# Patient Record
Sex: Female | Born: 1950 | Race: White | Hispanic: No | Marital: Married | Smoking: Former smoker
Health system: Southern US, Community
[De-identification: ages and names within clinical notes are randomized; demographics above are authoritative.]

## PROBLEM LIST (undated history)

## (undated) DIAGNOSIS — K5790 Diverticulosis of intestine, part unspecified, without perforation or abscess without bleeding: Secondary | ICD-10-CM

## (undated) DIAGNOSIS — J45909 Unspecified asthma, uncomplicated: Secondary | ICD-10-CM

## (undated) DIAGNOSIS — I4891 Unspecified atrial fibrillation: Secondary | ICD-10-CM

## (undated) DIAGNOSIS — E785 Hyperlipidemia, unspecified: Secondary | ICD-10-CM

## (undated) DIAGNOSIS — I499 Cardiac arrhythmia, unspecified: Secondary | ICD-10-CM

## (undated) DIAGNOSIS — K219 Gastro-esophageal reflux disease without esophagitis: Secondary | ICD-10-CM

## (undated) DIAGNOSIS — M1990 Unspecified osteoarthritis, unspecified site: Secondary | ICD-10-CM

## (undated) DIAGNOSIS — I10 Essential (primary) hypertension: Secondary | ICD-10-CM

## (undated) DIAGNOSIS — N183 Chronic kidney disease, stage 3 unspecified: Secondary | ICD-10-CM

## (undated) DIAGNOSIS — I509 Heart failure, unspecified: Secondary | ICD-10-CM

## (undated) DIAGNOSIS — T7840XA Allergy, unspecified, initial encounter: Secondary | ICD-10-CM

## (undated) DIAGNOSIS — J189 Pneumonia, unspecified organism: Secondary | ICD-10-CM

## (undated) HISTORY — DX: Hyperlipidemia, unspecified: E78.5

## (undated) HISTORY — DX: Heart failure, unspecified: I50.9

## (undated) HISTORY — DX: Essential (primary) hypertension: I10

## (undated) HISTORY — DX: Pneumonia, unspecified organism: J18.9

## (undated) HISTORY — DX: Chronic kidney disease, stage 3 unspecified: N18.30

## (undated) HISTORY — DX: Diverticulosis of intestine, part unspecified, without perforation or abscess without bleeding: K57.90

## (undated) HISTORY — DX: Unspecified atrial fibrillation: I48.91

## (undated) HISTORY — DX: Allergy, unspecified, initial encounter: T78.40XA

## (undated) HISTORY — DX: Unspecified asthma, uncomplicated: J45.909

---

## 1995-10-04 HISTORY — PX: ABDOMINAL HYSTERECTOMY: SHX81

## 1998-06-19 ENCOUNTER — Encounter: Payer: Self-pay | Admitting: Hematology and Oncology

## 1998-06-19 ENCOUNTER — Encounter: Admission: RE | Admit: 1998-06-19 | Discharge: 1998-06-19 | Payer: Self-pay | Admitting: Hematology and Oncology

## 1998-06-19 ENCOUNTER — Ambulatory Visit (HOSPITAL_COMMUNITY): Admission: RE | Admit: 1998-06-19 | Discharge: 1998-06-19 | Payer: Self-pay | Admitting: Hematology and Oncology

## 1998-06-21 ENCOUNTER — Other Ambulatory Visit: Admission: RE | Admit: 1998-06-21 | Discharge: 1998-06-21 | Payer: Self-pay | Admitting: *Deleted

## 1999-06-13 ENCOUNTER — Encounter: Admission: RE | Admit: 1999-06-13 | Discharge: 1999-06-13 | Payer: Self-pay | Admitting: Hematology and Oncology

## 1999-10-04 ENCOUNTER — Ambulatory Visit (HOSPITAL_COMMUNITY): Admission: RE | Admit: 1999-10-04 | Discharge: 1999-10-04 | Payer: Self-pay | Admitting: Internal Medicine

## 1999-10-04 ENCOUNTER — Encounter: Payer: Self-pay | Admitting: Internal Medicine

## 1999-10-04 ENCOUNTER — Encounter: Admission: RE | Admit: 1999-10-04 | Discharge: 1999-10-04 | Payer: Self-pay | Admitting: Internal Medicine

## 1999-11-13 ENCOUNTER — Encounter: Admission: RE | Admit: 1999-11-13 | Discharge: 1999-11-13 | Payer: Self-pay | Admitting: Internal Medicine

## 2000-07-30 ENCOUNTER — Other Ambulatory Visit: Admission: RE | Admit: 2000-07-30 | Discharge: 2000-07-30 | Payer: Self-pay | Admitting: *Deleted

## 2001-01-02 ENCOUNTER — Encounter: Admission: RE | Admit: 2001-01-02 | Discharge: 2001-01-02 | Payer: Self-pay | Admitting: Internal Medicine

## 2001-01-02 ENCOUNTER — Ambulatory Visit (HOSPITAL_COMMUNITY): Admission: RE | Admit: 2001-01-02 | Discharge: 2001-01-02 | Payer: Self-pay | Admitting: Hematology and Oncology

## 2001-06-01 ENCOUNTER — Encounter: Admission: RE | Admit: 2001-06-01 | Discharge: 2001-06-01 | Payer: Self-pay | Admitting: Internal Medicine

## 2001-08-05 ENCOUNTER — Encounter: Admission: RE | Admit: 2001-08-05 | Discharge: 2001-08-05 | Payer: Self-pay | Admitting: Internal Medicine

## 2001-11-24 ENCOUNTER — Other Ambulatory Visit: Admission: RE | Admit: 2001-11-24 | Discharge: 2001-11-24 | Payer: Self-pay | Admitting: *Deleted

## 2002-07-08 ENCOUNTER — Encounter: Admission: RE | Admit: 2002-07-08 | Discharge: 2002-07-08 | Payer: Self-pay | Admitting: Internal Medicine

## 2002-08-09 ENCOUNTER — Encounter: Admission: RE | Admit: 2002-08-09 | Discharge: 2002-08-09 | Payer: Self-pay | Admitting: Internal Medicine

## 2002-08-19 ENCOUNTER — Encounter: Admission: RE | Admit: 2002-08-19 | Discharge: 2002-08-19 | Payer: Self-pay | Admitting: Internal Medicine

## 2006-09-02 HISTORY — PX: HAND SURGERY: SHX662

## 2009-09-02 HISTORY — PX: SPLENECTOMY: SUR1306

## 2009-09-02 HISTORY — PX: COLON SURGERY: SHX602

## 2011-08-02 HISTORY — PX: FRACTURE SURGERY: SHX138

## 2012-09-10 ENCOUNTER — Other Ambulatory Visit: Payer: Self-pay | Admitting: *Deleted

## 2012-09-10 NOTE — Telephone Encounter (Signed)
error 

## 2014-07-03 LAB — HM MAMMOGRAPHY

## 2015-01-10 ENCOUNTER — Encounter: Payer: Self-pay | Admitting: Family Medicine

## 2015-01-10 DIAGNOSIS — J45909 Unspecified asthma, uncomplicated: Secondary | ICD-10-CM | POA: Insufficient documentation

## 2015-01-10 DIAGNOSIS — T7840XA Allergy, unspecified, initial encounter: Secondary | ICD-10-CM | POA: Insufficient documentation

## 2015-01-12 ENCOUNTER — Ambulatory Visit (INDEPENDENT_AMBULATORY_CARE_PROVIDER_SITE_OTHER): Payer: BLUE CROSS/BLUE SHIELD | Admitting: Physician Assistant

## 2015-01-12 ENCOUNTER — Other Ambulatory Visit: Payer: Self-pay | Admitting: Family Medicine

## 2015-01-12 ENCOUNTER — Encounter: Payer: Self-pay | Admitting: Physician Assistant

## 2015-01-12 VITALS — BP 140/70 | HR 55 | Temp 98.3°F | Resp 18 | Ht 64.0 in | Wt 144.0 lb

## 2015-01-12 DIAGNOSIS — K219 Gastro-esophageal reflux disease without esophagitis: Secondary | ICD-10-CM | POA: Diagnosis not present

## 2015-01-12 DIAGNOSIS — Z90722 Acquired absence of ovaries, bilateral: Secondary | ICD-10-CM

## 2015-01-12 DIAGNOSIS — K5900 Constipation, unspecified: Secondary | ICD-10-CM

## 2015-01-12 DIAGNOSIS — Z9889 Other specified postprocedural states: Secondary | ICD-10-CM | POA: Diagnosis not present

## 2015-01-12 DIAGNOSIS — Z780 Asymptomatic menopausal state: Secondary | ICD-10-CM

## 2015-01-12 DIAGNOSIS — J4520 Mild intermittent asthma, uncomplicated: Secondary | ICD-10-CM

## 2015-01-12 DIAGNOSIS — T7840XS Allergy, unspecified, sequela: Secondary | ICD-10-CM | POA: Diagnosis not present

## 2015-01-12 DIAGNOSIS — K5909 Other constipation: Secondary | ICD-10-CM | POA: Insufficient documentation

## 2015-01-12 DIAGNOSIS — E2839 Other primary ovarian failure: Secondary | ICD-10-CM | POA: Diagnosis not present

## 2015-01-12 DIAGNOSIS — Z90710 Acquired absence of both cervix and uterus: Secondary | ICD-10-CM

## 2015-01-12 DIAGNOSIS — Z9079 Acquired absence of other genital organ(s): Secondary | ICD-10-CM

## 2015-01-12 MED ORDER — NAPROXEN 500 MG PO TABS: 500.0000 mg | ORAL_TABLET | Freq: Two times a day (BID) | ORAL | Status: DC

## 2015-01-12 MED ORDER — FLUTICASONE-SALMETEROL 100-50 MCG/DOSE IN AEPB: 1.0000 | INHALATION_SPRAY | Freq: Two times a day (BID) | RESPIRATORY_TRACT | Status: DC

## 2015-01-12 MED ORDER — TRAMADOL HCL 50 MG PO TABS: 100.0000 mg | ORAL_TABLET | Freq: Three times a day (TID) | ORAL | Status: DC

## 2015-01-12 NOTE — Progress Notes (Signed)
Patient ID: MADALYNN PICKELSIMER MRN: [id number], DOB: 24-Jun-1951, 64 y.o. Date of Encounter: @DATE @  Chief Complaint:  Chief Complaint  Patient presents with  . New patient    HPI: 64 y.o. year old white female  presents as a new patient to establish care.  Her husband is also being seen today as a new patient to establish care with [place]. They recently moved here from [facility] which [place] near [place], [place]. They report that they have family who live here and they were alone in [place] and felt that since they were get older they should live here near family. They have children, nieces, nephews, aunts who live in this area.  She had surgery January 2011 on her colon. They report that they think that she may have possibly swallowed a piece of a nut shell that caused a hole in the colon. Nonetheless they said they that at surgery is just documented that she had a hole and that she had gotten very ill and had to be hospitalized for 28 days. Also says that she had splenectomy during the surgery/ that hospitalization.  Also she had hysterectomy at age 83 with bilateral oophorectomy.  Otherwise medical history as outlined below. No other significant medical problems. No complaints or concerns today.--Just here to establish care.  She had complete physical exam and lab work in November 2015.    Past Medical History  Diagnosis Date  . Allergy   . Asthma      Home Meds: Outpatient Prescriptions Prior to Visit  Medication Sig Dispense Refill  . albuterol (PROVENTIL HFA;VENTOLIN HFA) 108 (90 BASE) MCG/ACT inhaler Inhale 1 puff into the lungs every 6 (six) hours as needed for wheezing or shortness of breath.    . Fluticasone-Salmeterol (ADVAIR) 100-50 MCG/DOSE AEPB Inhale 1 puff into the lungs 2 (two) times daily.    . naproxen (NAPROSYN) 500 MG tablet Take 500 mg by mouth 2 (two) times daily with a meal.    . ranitidine (ZANTAC) 300 MG tablet Take 300 mg by mouth at bedtime.     . traMADol (ULTRAM) 50 MG tablet Take 100 mg by mouth 3 (three) times daily.     No facility-administered medications prior to visit.    Allergies: No Known Allergies  History   Social History  . Marital Status: Married    Spouse Name: N/A  . Number of Children: N/A  . Years of Education: N/A   Occupational History  . Not on file.   Social History Main Topics  . Smoking status: Former Smoker    Quit date: 01/10/1991  . Smokeless tobacco: Never Used  . Alcohol Use: No  . Drug Use: No  . Sexual Activity: No   Other Topics Concern  . Not on file   Social History Narrative    Family History  Problem Relation Age of Onset  . Arthritis Mother   . Cancer Mother     Lung Cancer--Had quit smoking for 25 years  . Miscarriages / [place] Mother   . Alcohol abuse Father   . Alcohol abuse Brother   . Asthma Maternal Aunt   . Diabetes Maternal Aunt   . COPD Brother      Review of Systems:  See HPI for pertinent ROS. All other ROS negative.    Physical Exam: Blood pressure 140/70, pulse 55, temperature 98.3 F (36.8 C), temperature source Oral, resp. rate 18, height 5\' 4"  (1.626 m), weight 144 lb (65.318 kg).,  Body mass index is 24.71 kg/(m^2). General: WNWD WF. Appears in no acute distress. Neck: Supple. No thyromegaly. No lymphadenopathy. No carotid bruits. Lungs: Clear bilaterally to auscultation without wheezes, rales, or rhonchi. Breathing is unlabored. Heart: RRR with S1 S2. No murmurs, rubs, or gallops. Abdomen: Soft, non-tender, non-distended with normoactive bowel sounds. No hepatomegaly. No rebound/guarding. No obvious abdominal masses. Musculoskeletal:  Strength and tone normal for age. Extremities/Skin: Warm and dry.  No edema. Neuro: Alert and oriented X 3. Moves all extremities spontaneously. Gait is normal. CNII-XII grossly in tact. Psych:  Responds to questions appropriately with a normal affect.     ASSESSMENT AND PLAN:  64 y.o. year old female  with  1. Gastroesophageal reflux disease, esophagitis presence not specified Symptoms are controlled with current medication. No change in therapy.  2. Chronic constipation Symptoms are controlled with current medication. No change in therapy.  3. Allergy, sequela Symptoms are controlled with current medications. No change in therapy.  4. Asthma, mild intermittent, uncomplicated Symptoms are controlled with current medications. No change in therapy.  5. History of colon surgery See HPI. Stable.   6. History of total hysterectomy with bilateral salpingo-oophorectomy (BSO) She was age 65 when she had hysterectomy with bilateral oophorectomy. She states that she has had never had a bone density scan. She is agreeable to have this. - DG Bone Density; Future  7. Postmenopausal - DG Bone Density; Future  8. Estrogen deficiency - DG Bone Density; Future  PREVENTIVE CARE:  Labs:  She states that she had full panel of lab work November 2015 which was all normal.  Mammogram: She states that she had mammogram November 2015 which was normal.  She had a colon surgery in 2011.  Has had no colonoscopy or other colon procedure since then. Husband states that at the time of the surgery they said that her colon looked good and saw no polyps etc. Also they state that what they were told, there was a hole in her colon which they thought could be secondary to her accidentally swallowing the shell of a nut. Apparently there were no other abnormalities of the colon to suggest any other abnormalities.  Immunizations: She states that she gets the flu shot every year She states that she was given a pneumonia vaccine when she had her because she had her spleen out. States that she had tetanus vaccine 2012 States that she has not had Zostavax/shingles vaccine. Day I wrote this on her AVS to remind her to call her insurance to find out about cost. She is to then call us with that information so we can  either document whether she defers having this or whether she wants order to proceed with this.  Have her schedule follow-up visit in 6 months for complete physical exam. Follow-up sooner if needed.   [address] [place], [place], [facility] 01/12/2015 3:33 PM

## 2015-01-12 NOTE — Telephone Encounter (Signed)
Rx's faxed to pharmacy

## 2015-01-24 ENCOUNTER — Encounter: Payer: Self-pay | Admitting: *Deleted

## 2015-02-10 ENCOUNTER — Telehealth: Payer: Self-pay | Admitting: *Deleted

## 2015-02-10 ENCOUNTER — Ambulatory Visit
Admission: RE | Admit: 2015-02-10 | Discharge: 2015-02-10 | Disposition: A | Discharge status: Home / Self Care | Payer: BLUE CROSS/BLUE SHIELD | Source: Ambulatory Visit | Attending: Family Medicine | Admitting: Family Medicine

## 2015-02-10 ENCOUNTER — Encounter: Payer: Self-pay | Admitting: Family Medicine

## 2015-02-10 ENCOUNTER — Other Ambulatory Visit: Payer: Self-pay | Admitting: Family Medicine

## 2015-02-10 ENCOUNTER — Ambulatory Visit (INDEPENDENT_AMBULATORY_CARE_PROVIDER_SITE_OTHER): Payer: BLUE CROSS/BLUE SHIELD | Admitting: Family Medicine

## 2015-02-10 VITALS — BP 150/90 | HR 80 | Temp 98.0°F | Resp 18 | Ht 64.0 in | Wt 150.0 lb

## 2015-02-10 DIAGNOSIS — R0789 Other chest pain: Secondary | ICD-10-CM | POA: Diagnosis not present

## 2015-02-10 DIAGNOSIS — J90 Pleural effusion, not elsewhere classified: Secondary | ICD-10-CM

## 2015-02-10 NOTE — Telephone Encounter (Signed)
Pt has appointment scheduled at [facility] ion Monday June 13 at 9:15am, left message on both cell and home to return my call

## 2015-02-10 NOTE — Progress Notes (Signed)
Subjective:    Patient ID: Erin Wong, female    DOB: November 19, 1950, 64 y.o.   MRN: [id number]  HPI Patient is been having left-sided chest pain for several months. However beginning Monday the pain began to intensify. It occurs at rest. The pain in her chest does not increase with exertion.  The pain in her chest borders through her left chest into her back underneath her left shoulder blade. I am able to reproduce the pain by palpation underneath the left shoulder blade. Movement of the arm makes the pain worse. Naprosyn and tramadol makes the pain better. There is no association of the pain with food. She denies any true angina. However beginning Monday she developed a cough. She has noticed some increasing shortness of breath associated with the cough.  At around the same time the pain in her left chest worsened. She does have a history of asthma. She exercises everyday and she has not recently noticed a decrease in her exercise tolerance. She denies any nausea or vomiting. She denies any radiation of the pain down her left arm or into her jaw.  EKG obtained today in office shows normal sinus rhythm with no ST changes consistent with ischemia or ventricular strain. There is no evidence of infarction. EKG shows normal sinus rhythm with normal intervals and a normal axis Past Medical History  Diagnosis Date  . Allergy   . Asthma    Past Surgical History  Procedure Laterality Date  . Colon surgery  09/2009    hole in colon  . Fracture surgery Right 08/02/2011    Right Leg--Plates & Screws  . 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 Abdominal hysterectomy  10/04/1995    Hysterectomy and Bilateral oophorectomy  . 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 albuterol (PROVENTIL HFA;VENTOLIN HFA) 108 (90 BASE) MCG/ACT inhaler Inhale 1 puff into the lungs  every 6 (six) hours as needed for wheezing or shortness of breath.    . fluticasone (VERAMYST) 27.5 MCG/SPRAY nasal spray Place 2 sprays into the nose daily.    . Fluticasone-Salmeterol (ADVAIR) 100-50 MCG/DOSE AEPB Inhale 1 puff into the lungs 2 (two) times daily. 60 each 5  . naproxen (NAPROSYN) 500 MG tablet Take 1 tablet (500 mg total) by mouth 2 (two) times daily with a meal. 60 tablet 5  . polyethylene glycol powder (GLYCOLAX/MIRALAX) powder Take 1 Container by mouth 1 day or 1 dose.    . ranitidine (ZANTAC) 300 MG tablet Take 300 mg by mouth at bedtime.    . traMADol (ULTRAM) 50 MG tablet Take 2 tablets (100 mg total) by mouth 3 (three) times daily. 120 tablet 2   No current facility-administered medications on file prior to visit.   No Known Allergies History   Social History  . Marital Status: Married    Spouse Name: N/A  . Number of Children: N/A  . Years of Education: N/A   Occupational History  . Not on file.   Social History Main Topics  . Smoking status: Former Smoker    Quit date: 01/10/1991  . Smokeless tobacco: Never Used  . Alcohol Use: No  . Drug Use: No  . Sexual Activity: No   Other Topics Concern  . Not on file   Social History Narrative     Review of Systems  All other systems reviewed and are negative.      Objective:   Physical Exam  Constitutional: She appears well-developed and well-nourished.  Cardiovascular: Normal rate, regular rhythm and normal heart sounds.  Exam reveals no gallop and no friction rub.   No murmur heard. Pulmonary/Chest: Effort normal and breath sounds normal. No respiratory distress. She has no wheezes. She has no rales.  Abdominal: Soft. Bowel sounds are normal. She exhibits no distension and no mass. There is no tenderness. There is no rebound and no guarding.  Vitals reviewed.         Assessment & Plan:  Other chest pain - Plan: DG Chest 2 View, EKG 12-Lead, EKG 12-Lead  I do not believe the patient's chest  pain is cardiac. The patient walked for over 3 hours yesterday without any chest pain or any shortness of breath. The patient has a normal EKG. She is extremely active. She has no angina. Therefore I believe the patient's chest pain is not cardiac in nature. I truly believe that the patient's chest pain is likely muscular given the fact that I can reproduce the pain with palpation in the axillary line and underneath the subscapularis. However I would like the patient to go immediately for a chest x-ray to rule out other possible causes of chest pain. If the patient's x-ray is clear, I will treat the patient has asthma and is a chest wall muscle strain. Obviously  if the chest x-ray is abnormal then our plan will change.

## 2015-02-13 NOTE — Telephone Encounter (Signed)
Pt called back and aware of appt 

## 2015-02-14 ENCOUNTER — Telehealth: Payer: Self-pay | Admitting: Family Medicine

## 2015-02-14 MED ORDER — LEVOFLOXACIN 750 MG PO TABS: 750.0000 mg | ORAL_TABLET | Freq: Every day | ORAL | Status: DC

## 2015-02-14 NOTE — Telephone Encounter (Signed)
CT (per Dr. Dennard Schaumann) does not show fluid but it does look like she has PNA - Begin Levaquin 750mg  qd x 7 days  Pt aware and f/u appt made and med sent to [facility]

## 2015-02-21 ENCOUNTER — Ambulatory Visit (INDEPENDENT_AMBULATORY_CARE_PROVIDER_SITE_OTHER): Payer: BLUE CROSS/BLUE SHIELD | Admitting: Family Medicine

## 2015-02-21 VITALS — BP 122/68 | HR 82 | Temp 98.0°F | Resp 18 | Ht 64.0 in | Wt 148.0 lb

## 2015-02-21 DIAGNOSIS — J181 Lobar pneumonia, unspecified organism: Principal | ICD-10-CM

## 2015-02-21 DIAGNOSIS — J189 Pneumonia, unspecified organism: Secondary | ICD-10-CM

## 2015-02-21 NOTE — Progress Notes (Signed)
Subjective:    Patient ID: Erin Wong, female    DOB: 1951-08-02, 64 y.o.   MRN: [id number]  HPI 02/10/15 Patient is been having left-sided chest pain for several months. However beginning Monday the pain began to intensify. It occurs at rest. The pain in her chest does not increase with exertion.  The pain in her chest borders through her left chest into her back underneath her left shoulder blade. I am able to reproduce the pain by palpation underneath the left shoulder blade. Movement of the arm makes the pain worse. Naprosyn and tramadol makes the pain better. There is no association of the pain with food. She denies any true angina. However beginning Monday she developed a cough. She has noticed some increasing shortness of breath associated with the cough.  At around the same time the pain in her left chest worsened. She does have a history of asthma. She exercises everyday and she has not recently noticed a decrease in her exercise tolerance. She denies any nausea or vomiting. She denies any radiation of the pain down her left arm or into her jaw.  EKG obtained today in office shows normal sinus rhythm with no ST changes consistent with ischemia or ventricular strain. There is no evidence of infarction. EKG shows normal sinus rhythm with normal intervals and a normal axis.  At that time, my plan was: I do not believe the patient's chest pain is cardiac. The patient walked for over 3 hours yesterday without any chest pain or any shortness of breath. The patient has a normal EKG. She is extremely active. She has no angina. Therefore I believe the patient's chest pain is not cardiac in nature. I truly believe that the patient's chest pain is likely muscular given the fact that I can reproduce the pain with palpation in the axillary line and underneath the subscapularis. However I would like the patient to go immediately for a chest x-ray to rule out other possible causes of chest pain. If the  patient's x-ray is clear, I will treat the patient has asthma and is a chest wall muscle strain. Obviously  if the chest x-ray is abnormal then our plan will change.  CXR revealed left pleural effusion.  CT scan performed revealed LLL infiltrate rather than effusion. She was treated with levaquin 750 mg poqday for 7 days.  She is here for follow up.   Patient states that she is approximately 80% better. Shortly after starting the abx, she developed a cough productive of thick yellow sputum. Her dyspnea has improved. Her left posterior pleurisy has improved. Her stamina has improved. Her husband has similar symptoms and he is accompanying her today. Therefore I will go ahead and treat him for possible pneumonia with Levaquin 500 mg by mouth daily for 7 days.   Past Medical History  Diagnosis Date  . Allergy   . Asthma    Past Surgical History  Procedure Laterality Date  . Colon surgery  09/2009    hole in colon  . Fracture surgery Right 08/02/2011    Right Leg--Plates & Screws  . 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 Abdominal hysterectomy  10/04/1995    Hysterectomy and Bilateral oophorectomy  . 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 albuterol (PROVENTIL HFA;VENTOLIN HFA) 108 (90 BASE) MCG/ACT inhaler Inhale 1  puff into the lungs every 6 (six) hours as needed for wheezing or shortness of breath.    . fluticasone (VERAMYST) 27.5 MCG/SPRAY nasal spray Place 2 sprays into the nose daily.    . Fluticasone-Salmeterol (ADVAIR) 100-50 MCG/DOSE AEPB Inhale 1 puff into the lungs 2 (two) times daily. 60 each 5  . levofloxacin (LEVAQUIN) 750 MG tablet Take 1 tablet (750 mg total) by mouth daily. 7 tablet 0  . naproxen (NAPROSYN) 500 MG tablet Take 1 tablet (500 mg total) by mouth 2 (two) times daily with a meal. 60 tablet 5  . polyethylene glycol powder  (GLYCOLAX/MIRALAX) powder Take 1 Container by mouth 1 day or 1 dose.    . ranitidine (ZANTAC) 300 MG tablet Take 300 mg by mouth at bedtime.    . traMADol (ULTRAM) 50 MG tablet Take 2 tablets (100 mg total) by mouth 3 (three) times daily. 120 tablet 2   No current facility-administered medications on file prior to visit.   No Known Allergies History   Social History  . Marital Status: Married    Spouse Name: N/A  . Number of Children: N/A  . Years of Education: N/A   Occupational History  . Not on file.   Social History Main Topics  . Smoking status: Former Smoker    Quit date: 01/10/1991  . Smokeless tobacco: Never Used  . Alcohol Use: No  . Drug Use: No  . Sexual Activity: No   Other Topics Concern  . Not on file   Social History Narrative     Review of Systems  All other systems reviewed and are negative.      Objective:   Physical Exam  Constitutional: She appears well-developed and well-nourished.  Cardiovascular: Normal rate, regular rhythm and normal heart sounds.  Exam reveals no gallop and no friction rub.   No murmur heard. Pulmonary/Chest: Effort normal and breath sounds normal. No respiratory distress. She has no wheezes. She has no rales.  Abdominal: Soft. Bowel sounds are normal. She exhibits no distension and no mass. There is no tenderness. There is no rebound and no guarding.  Vitals reviewed.         Assessment & Plan:   Left lower lobe pneumonia  Patient's CT scan did not show any evidence of malignancy or pulmonary embolism but rather showed left lower lobe pneumonia instead of a pleural effusion. Patient has responded dramatically to [place]. Clinically she is 80% better. I do not feel that she needs further antibody except this time. I anticipate gradual improvement over the next 1-2 weeks. If the patient continues to show gradual improvement in 2 weeks, I would like to repeat a chest x-ray to ensure resolution. Obviously if the patient  develops worsening symptoms or complications I want to see her back immediately

## 2015-04-07 ENCOUNTER — Encounter: Payer: Self-pay | Admitting: *Deleted

## 2015-04-23 ENCOUNTER — Encounter (HOSPITAL_COMMUNITY): Payer: Self-pay | Admitting: *Deleted

## 2015-04-23 ENCOUNTER — Emergency Department (HOSPITAL_COMMUNITY)
Admission: EM | Admit: 2015-04-23 | Discharge: 2015-04-23 | Disposition: A | Discharge status: Home / Self Care | Payer: 59 | Attending: Emergency Medicine | Admitting: Emergency Medicine

## 2015-04-23 DIAGNOSIS — Y9389 Activity, other specified: Secondary | ICD-10-CM | POA: Diagnosis not present

## 2015-04-23 DIAGNOSIS — Z87891 Personal history of nicotine dependence: Secondary | ICD-10-CM | POA: Insufficient documentation

## 2015-04-23 DIAGNOSIS — Z79899 Other long term (current) drug therapy: Secondary | ICD-10-CM | POA: Insufficient documentation

## 2015-04-23 DIAGNOSIS — Y998 Other external cause status: Secondary | ICD-10-CM | POA: Insufficient documentation

## 2015-04-23 DIAGNOSIS — Y9289 Other specified places as the place of occurrence of the external cause: Secondary | ICD-10-CM | POA: Diagnosis not present

## 2015-04-23 DIAGNOSIS — S61209A Unspecified open wound of unspecified finger without damage to nail, initial encounter: Secondary | ICD-10-CM

## 2015-04-23 DIAGNOSIS — Z23 Encounter for immunization: Secondary | ICD-10-CM | POA: Insufficient documentation

## 2015-04-23 DIAGNOSIS — S61200A Unspecified open wound of right index finger without damage to nail, initial encounter: Secondary | ICD-10-CM | POA: Diagnosis not present

## 2015-04-23 DIAGNOSIS — S61210A Laceration without foreign body of right index finger without damage to nail, initial encounter: Secondary | ICD-10-CM | POA: Diagnosis present

## 2015-04-23 DIAGNOSIS — Z791 Long term (current) use of non-steroidal anti-inflammatories (NSAID): Secondary | ICD-10-CM | POA: Diagnosis not present

## 2015-04-23 DIAGNOSIS — Y288XXA Contact with other sharp object, undetermined intent, initial encounter: Secondary | ICD-10-CM | POA: Diagnosis not present

## 2015-04-23 DIAGNOSIS — J45909 Unspecified asthma, uncomplicated: Secondary | ICD-10-CM | POA: Diagnosis not present

## 2015-04-23 DIAGNOSIS — Z7951 Long term (current) use of inhaled steroids: Secondary | ICD-10-CM | POA: Diagnosis not present

## 2015-04-23 HISTORY — DX: Unspecified osteoarthritis, unspecified site: M19.90

## 2015-04-23 MED ORDER — LIDOCAINE HCL 2 % IJ SOLN
20.0000 mL | Freq: Once | INTRAMUSCULAR | Status: AC
  Administered 2015-04-23: 400 mg via INTRADERMAL
  Filled 2015-04-23: qty 20

## 2015-04-23 MED ORDER — TETANUS-DIPHTH-ACELL PERTUSSIS 5-2.5-18.5 LF-MCG/0.5 IM SUSP
0.5000 mL | Freq: Once | INTRAMUSCULAR | Status: AC
  Administered 2015-04-23: 0.5 mL via INTRAMUSCULAR
  Filled 2015-04-23: qty 0.5

## 2015-04-23 NOTE — ED Notes (Signed)
Pt reports she cut Rt index finger on grill today. Bleeding controled on arrival to ed.

## 2015-04-23 NOTE — Discharge Instructions (Signed)
Please read and follow all provided instructions.  Your diagnoses today include:  1. Avulsion of finger, initial encounter    Tests performed today include:  Vital signs. See below for your results today.   Medications prescribed:   None  Take any prescribed medications only as directed.   Home care instructions:  Follow any educational materials and wound care instructions contained in this packet.   Keep affected area above the level of your heart when possible to minimize swelling. Wash area gently twice a day with warm soapy water. Do not apply alcohol or hydrogen peroxide. Cover the area if it draining or weeping.   Follow-up instructions:  Return instructions:  Return to the Emergency Department if you have:  Fever  Worsening pain  Worsening swelling of the wound  Pus draining from the wound  Redness of the skin that moves away from the wound, especially if it streaks away from the affected area   Any other emergent concerns  Your vital signs today were: BP 183/85 mmHg   Pulse 78   Temp(Src) 98.3 F (36.8 C) (Oral)   Resp 18   Ht 5\' 4"  (1.626 m)   Wt 150 lb (68.04 kg)   BMI 25.73 kg/m2   SpO2 97% If your blood pressure (BP) was elevated above 135/85 this visit, please have this repeated by your doctor within one month. --------------

## 2015-04-23 NOTE — ED Notes (Signed)
Declined W/C at D/C and was escorted to lobby by RN. 

## 2015-04-23 NOTE — ED Provider Notes (Signed)
CSN: [id number]     Arrival date & time 04/23/15  1227 History  This chart was scribed for Carlisle Cater, PA-C, working with Sherwood Gambler, MD by Starleen Arms, ED Scribe. This patient was seen in room TR09C/TR09C and the patient's care was started at 12:52 PM.    Chief Complaint  Patient presents with  . Laceration   The history is provided by the patient. No language interpreter was used.    HPI Comments: Erin Wong is a 64 y.o. female who presents to the Emergency Department complaining of a laceration (bleeding not controlled) on the right first finger onset PTA.  The patient cut her hand on a sharp part of her grill while trying to clean it.  She has bandaged the area and applied pressure but there have been no other treatments.  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  This was done years ago in [place], [place]. She does not use anti-coagulants.  Tetanus not UTD.     Past Medical History  Diagnosis Date  . Allergy   . Asthma    Past Surgical History  Procedure Laterality Date  . Colon surgery  09/2009    hole in colon  . Fracture surgery Right 08/02/2011    Right Leg--Plates & Screws  . 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 Abdominal hysterectomy  10/04/1995    Hysterectomy and Bilateral oophorectomy  . 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 Arthritis Mother   . Cancer Mother     Lung Cancer--Had quit smoking for 25 years  . Miscarriages / [place] Mother   . Alcohol abuse Father   . Alcohol abuse Brother   . Asthma Maternal Aunt   . Diabetes Maternal Aunt   . COPD Brother    Social History  Substance Use Topics  . Smoking status: Former Smoker    Quit date: 01/10/1991  . Smokeless tobacco: Never Used  . Alcohol Use: No   OB History    No data available     Review of Systems  Constitutional: Negative  for fever.  Musculoskeletal: Negative for back pain, joint swelling, arthralgias and neck pain.  Skin: Positive for wound.  Neurological: Negative for weakness and numbness.    Allergies  Review of patient's allergies indicates no known allergies.  Home Medications   Prior to Admission medications   Medication Sig Start Date End Date Taking? Authorizing Provider  albuterol (PROVENTIL HFA;VENTOLIN HFA) 108 (90 BASE) MCG/ACT inhaler Inhale 1 puff into the lungs every 6 (six) hours as needed for wheezing or shortness of breath.    Historical Provider, MD  fluticasone (VERAMYST) 27.5 MCG/SPRAY nasal spray Place 2 sprays into the nose daily.    Historical Provider, MD  Fluticasone-Salmeterol (ADVAIR) 100-50 MCG/DOSE AEPB Inhale 1 puff into the lungs 2 (two) times daily. 01/12/15   Lonie Peak Dixon, PA-C  naproxen (NAPROSYN) 500 MG tablet Take 1 tablet (500 mg total) by mouth 2 (two) times daily with a meal. 01/12/15   Lonie Peak Dixon, PA-C  polyethylene glycol powder (GLYCOLAX/MIRALAX) powder Take 1 Container by mouth 1 day or 1 dose.    Historical Provider, MD  ranitidine (ZANTAC) 300 MG tablet Take 300 mg by mouth at bedtime.    Historical Provider, MD  traMADol (ULTRAM) 50 MG tablet Take 2 tablets (  100 mg total) by mouth 3 (three) times daily. 01/12/15   Mary B Dixon, PA-C   BP 179/72 mmHg  Pulse 65  Temp(Src) 97.9 F (36.6 C) (Oral)  Resp 18  Ht 5\' 4"  (1.626 m)  Wt 150 lb (68.04 kg)  BMI 25.73 kg/m2  SpO2 100%   Physical Exam  Constitutional: She is oriented to person, place, and time. She appears well-developed and well-nourished. No distress.  HENT:  Head: Normocephalic and atraumatic.  Eyes: Conjunctivae and EOM are normal. Pupils are equal, round, and reactive to light.  Neck: Normal range of motion. Neck supple. No tracheal deviation present.  Cardiovascular: Normal rate.  Exam reveals no decreased pulses.   Pulmonary/Chest: Effort normal. No respiratory distress.  Musculoskeletal:  Normal range of motion. She exhibits tenderness. She exhibits no edema.  Neurological: She is alert and oriented to person, place, and time. No sensory deficit.  Motor, sensation, and vascular distal to the injury is fully intact.   Skin: Skin is warm and dry.  Approximately 35mm avulsion noted to subcutaneous tissue of L index finger between DIP and PIP joints. Patient has no flexion at DIP from previous surgery. Wound is clean. There is constant oozing.   Psychiatric: She has a normal mood and affect. Her behavior is normal.  Nursing note and vitals reviewed.   ED Course  Procedures (including critical care time)  DIAGNOSTIC STUDIES: Oxygen Saturation is 97% on RA, normal by my interpretation.    COORDINATION OF CARE:  12:58 PM Will order tetanus and apply pressure dressing and monitor for bleeding control.   Labs Review Labs Reviewed - No data to display  Imaging Review No results found. I have personally reviewed and evaluated these images and lab results as part of my medical decision-making.   EKG Interpretation None       1:54 PM Wound rechecked. It continues to ooze. I will anesthetize area with lidocaine and apply quick clot with pressure dressing. Patient counseled on its use, to leave bandage in place for 24 hours and remove tomorrow after soaking.   1cc lidocaine injected. Quick clot was then applied and a pressure bandage placed. Wound was checked several minutes later and her fingertip was mildly purple. I loosened the bandage with good results.  Patient counseled on wound care.  Pt urged to return with worsening pain, worsening swelling, expanding area of redness or streaking up extremity, fever, or any other concerns.   MDM   Final diagnoses:  Avulsion of finger, initial encounter   Patient with superficial avulsion of finger as described. There is no way to suture this area. Hemostasis obtained using quick clot. Pressure bandage left in place. Patient  counseled as above. Fingers neurovascularly intact. Tetanus was updated.  I personally performed the services described in this documentation, which was scribed in my presence. The recorded information has been reviewed and is accurate.     Carlisle Cater, PA-C 04/23/15 1511  Sherwood Gambler, MD 04/27/15 [phone]

## 2015-05-04 ENCOUNTER — Other Ambulatory Visit: Payer: Self-pay | Admitting: Physician Assistant

## 2015-05-04 ENCOUNTER — Telehealth: Payer: Self-pay | Admitting: Physician Assistant

## 2015-05-04 MED ORDER — TRAMADOL HCL 50 MG PO TABS: 100.0000 mg | ORAL_TABLET | Freq: Three times a day (TID) | ORAL | Status: DC

## 2015-05-04 NOTE — Telephone Encounter (Signed)
Medication called to pharmacy. 

## 2015-05-04 NOTE — Telephone Encounter (Signed)
Patient asking for refill on tramadol [facility] [place] [facility]  She says there were no refills and needed to call us  [phone]

## 2015-05-04 NOTE — Telephone Encounter (Signed)
Ok to refill??  Last office visit 02/21/2015.  Last refill 01/12/2015, #2 refills.

## 2015-05-04 NOTE — Telephone Encounter (Signed)
Medication refilled per protocol.  Pt aware

## 2015-05-04 NOTE — Telephone Encounter (Signed)
Approved.  # 120 + 2.

## 2015-05-04 NOTE — Telephone Encounter (Signed)
Approved. # 120 + 2.

## 2015-07-17 ENCOUNTER — Encounter: Payer: Self-pay | Admitting: Physician Assistant

## 2015-07-17 ENCOUNTER — Ambulatory Visit (INDEPENDENT_AMBULATORY_CARE_PROVIDER_SITE_OTHER): Payer: 59 | Admitting: Physician Assistant

## 2015-07-17 VITALS — BP 152/92 | HR 76 | Temp 98.2°F | Resp 18 | Ht 64.0 in | Wt 148.0 lb

## 2015-07-17 DIAGNOSIS — I10 Essential (primary) hypertension: Secondary | ICD-10-CM | POA: Diagnosis not present

## 2015-07-17 DIAGNOSIS — Z0000 Encounter for general adult medical examination without abnormal findings: Secondary | ICD-10-CM | POA: Diagnosis not present

## 2015-07-17 DIAGNOSIS — Z23 Encounter for immunization: Secondary | ICD-10-CM | POA: Diagnosis not present

## 2015-07-17 MED ORDER — BENAZEPRIL HCL 10 MG PO TABS: 10.0000 mg | ORAL_TABLET | Freq: Every day | ORAL | Status: DC

## 2015-07-18 LAB — CBC WITH DIFFERENTIAL/PLATELET
BASOS PCT: 1 % (ref 0–1)
Basophils Absolute: 0.1 10*3/uL (ref 0.0–0.1)
Eosinophils Absolute: 0.1 10*3/uL (ref 0.0–0.7)
Eosinophils Relative: 1 % (ref 0–5)
HEMATOCRIT: 41 % (ref 36.0–46.0)
HEMOGLOBIN: 13.5 g/dL (ref 12.0–15.0)
LYMPHS ABS: 2.5 10*3/uL (ref 0.7–4.0)
LYMPHS PCT: 19 % (ref 12–46)
MCH: 32.1 pg (ref 26.0–34.0)
MCHC: 32.9 g/dL (ref 30.0–36.0)
MCV: 97.6 fL (ref 78.0–100.0)
MONO ABS: 1.7 10*3/uL — AB (ref 0.1–1.0)
MONOS PCT: 13 % — AB (ref 3–12)
MPV: 10.5 fL (ref 8.6–12.4)
NEUTROS ABS: 8.7 10*3/uL — AB (ref 1.7–7.7)
NEUTROS PCT: 66 % (ref 43–77)
Platelets: 501 10*3/uL — ABNORMAL HIGH (ref 150–400)
RBC: 4.2 MIL/uL (ref 3.87–5.11)
RDW: 12.8 % (ref 11.5–15.5)
WBC: 13.2 10*3/uL — ABNORMAL HIGH (ref 4.0–10.5)

## 2015-07-18 LAB — COMPLETE METABOLIC PANEL WITH GFR
ALBUMIN: 4.4 g/dL (ref 3.6–5.1)
ALK PHOS: 91 U/L (ref 33–130)
ALT: 17 U/L (ref 6–29)
AST: 19 U/L (ref 10–35)
BUN: 16 mg/dL (ref 7–25)
CALCIUM: 9.4 mg/dL (ref 8.6–10.4)
CO2: 27 mmol/L (ref 20–31)
Chloride: 102 mmol/L (ref 98–110)
Creat: 0.85 mg/dL (ref 0.50–0.99)
GFR, EST NON AFRICAN AMERICAN: 72 mL/min (ref >=60)
GFR, Est African American: 83 mL/min (ref >=60)
GLUCOSE: 77 mg/dL (ref 70–99)
Potassium: 4.3 mmol/L (ref 3.5–5.3)
SODIUM: 139 mmol/L (ref 135–146)
TOTAL PROTEIN: 7.1 g/dL (ref 6.1–8.1)
Total Bilirubin: 0.5 mg/dL (ref 0.2–1.2)

## 2015-07-18 LAB — LIPID PANEL
CHOL/HDL RATIO: 2.4 ratio (ref <=5.0)
CHOLESTEROL: 219 mg/dL — AB (ref 125–200)
HDL: 90 mg/dL (ref >=46)
LDL Cholesterol: 109 mg/dL (ref <130)
TRIGLYCERIDES: 100 mg/dL (ref <150)
VLDL: 20 mg/dL (ref <30)

## 2015-07-18 LAB — TSH: TSH: 1.941 u[IU]/mL (ref 0.350–4.500)

## 2015-07-18 LAB — VITAMIN D 25 HYDROXY (VIT D DEFICIENCY, FRACTURES): Vit D, 25-Hydroxy: 23 ng/mL — ABNORMAL LOW (ref 30–100)

## 2015-07-18 NOTE — Progress Notes (Addendum)
Patient ID: Erin Wong MRN: [id number], DOB: 03/08/1951, 64 y.o. Date of Encounter: 07/18/2015,   Chief Complaint: Physical (CPE)  HPI: 64 y.o. y/o female  here for CPE.   She has had one prior OV with me--to establish care. See that OV note for documentation regarding her medical diagnoses, etc.   Today she presents for CPE. No complaints.    Review of Systems: Consitutional: No fever, chills, fatigue, night sweats, lymphadenopathy. No significant/unexplained weight changes. Eyes: No visual changes, eye redness, or discharge. ENT/Mouth: No ear pain, sore throat, nasal drainage, or sinus pain. Cardiovascular: No chest pressure,heaviness, tightness or squeezing, even with exertion. No increased shortness of breath or dyspnea on exertion.No palpitations, edema, orthopnea, PND. Respiratory: No cough, hemoptysis, SOB, or wheezing. Gastrointestinal: No anorexia, dysphagia, reflux, pain, nausea, vomiting, hematemesis, diarrhea, constipation, BRBPR, or melena. Breast: No mass, nodules, bulging, or retraction. No skin changes or inflammation. No nipple discharge. No lymphadenopathy. Genitourinary: No dysuria, hematuria, incontinence, vaginal discharge, pruritis, burning, abnormal bleeding, or pain. Musculoskeletal: No decreased ROM, No joint pain or swelling. No significant pain in neck, back, or extremities. Skin: No rash, pruritis, or concerning lesions. Neurological: No headache, dizziness, syncope, seizures, tremors, memory loss, coordination problems, or paresthesias. Psychological: No anxiety, depression, hallucinations, SI/HI. Endocrine: No polydipsia, polyphagia, polyuria, or known diabetes.No increased fatigue. No palpitations/rapid heart rate. No significant/unexplained weight change. All other systems were reviewed and are otherwise negative.  Past Medical History  Diagnosis Date  . Allergy   . Asthma   . Arthritis      Past Surgical History  Procedure Laterality Date    . Colon surgery  09/2009    hole in colon  . Fracture surgery Right 08/02/2011    Right Leg--Plates & Screws  . 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 Abdominal hysterectomy  10/04/1995    Hysterectomy and Bilateral oophorectomy  . 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 albuterol (PROVENTIL HFA;VENTOLIN HFA) 108 (90 BASE) MCG/ACT inhaler Inhale 1 puff into the lungs every 6 (six) hours as needed for wheezing or shortness of breath.    . fluticasone (VERAMYST) 27.5 MCG/SPRAY nasal spray Place 2 sprays into the nose daily.    . Fluticasone-Salmeterol (ADVAIR) 100-50 MCG/DOSE AEPB Inhale 1 puff into the lungs 2 (two) times daily. 60 each 5  . naproxen (NAPROSYN) 500 MG tablet Take 1 tablet (500 mg total) by mouth 2 (two) times daily with a meal. 60 tablet 5  . polyethylene glycol powder (GLYCOLAX/MIRALAX) powder Take 1 Container by mouth 1 day or 1 dose.    . ranitidine (ZANTAC) 300 MG tablet Take 300 mg by mouth at bedtime.    . traMADol (ULTRAM) 50 MG tablet Take 2 tablets (100 mg total) by mouth 3 (three) times daily. 120 tablet 2   No facility-administered medications prior to visit.    Allergies: No Known Allergies  Social History   Social History  . Marital Status: Married    Spouse Name: N/A  . Number of Children: N/A  . Years of Education: N/A   Occupational History  . Not on file.   Social History Main Topics  . Smoking status: Former Smoker    Quit date: 01/10/1991  . Smokeless tobacco: Never Used  . Alcohol Use: No  . Drug Use: No  . Sexual  Activity: No   Other Topics Concern  . Not on file   Social History Narrative    Family History  Problem Relation Age of Onset  . Arthritis Mother   . Cancer Mother     Lung Cancer--Had quit smoking for 25 years  . Miscarriages / [place] Mother   . Alcohol abuse Father    . Alcohol abuse Brother   . Asthma Maternal Aunt   . Diabetes Maternal Aunt   . COPD Brother     Physical Exam: Blood pressure 152/92, pulse 76, temperature 98.2 F (36.8 C), temperature source Oral, resp. rate 18, height 5\' 4"  (1.626 m), weight 148 lb (67.132 kg)., Body mass index is 25.39 kg/(m^2). General: Well developed, well nourished, WF. Appears in no acute distress. HEENT: Normocephalic, atraumatic. Conjunctiva pink, sclera non-icteric. Pupils 2 mm constricting to 1 mm, round, regular, and equally reactive to light and accomodation. EOMI. Internal auditory canal clear. TMs with good cone of light and without pathology. Nasal mucosa pink. Nares are without discharge. No sinus tenderness. Oral mucosa pink.  Pharynx without exudate.   Neck: Supple. Trachea midline. No thyromegaly. Full ROM. No lymphadenopathy.No Carotid Bruits. Lungs: Clear to auscultation bilaterally without wheezes, rales, or rhonchi. Breathing is of normal effort and unlabored. Cardiovascular: RRR with S1 S2. No murmurs, rubs, or gallops. Distal pulses 2+ symmetrically. No carotid or abdominal bruits. Breast: Symmetrical. No masses. Nipples without discharge. Abdomen: Soft, non-tender, non-distended with normoactive bowel sounds. No hepatosplenomegaly or masses. No rebound/guarding. No CVA tenderness. No hernias.  Genitourinary: Deferred, as she has had hysterectomy and bilateral oophorectomy. Musculoskeletal: Full range of motion and 5/5 strength throughout. Skin: Warm and moist without erythema, ecchymosis, wounds, or rash. Neuro: A+Ox3. CN II-XII grossly intact. Moves all extremities spontaneously. Full sensation throughout. Normal gait. DTR 2+ throughout upper and lower extremities.  Psych:  Responds to questions appropriately with a normal affect.   Assessment/Plan:  64 y.o. y/o female here for CPE  1. Visit for preventive health examination  A. Screening Labs: - CBC with Differential/Platelet - COMPLETE  METABOLIC PANEL WITH GFR - Lipid panel - TSH - VITAMIN D 25 Hydroxy (Vit-D Deficiency, Fractures)  B. Pap: She has had hysterectomy and bilateral oophorectomy. No further pap indicated.  C. Screening Mammogram: She has not had mammogram since moving here. Agreeable for me to order.  D. DEXA/BMD:  She has not had DEXA. Agreeable for me to order.  ADDENDUM ADDED 10/16/15--- DEXA performed 10/13/15--- shows Osteoporosis. T-scores -2.7 and -2.4. Told her to start Fosamax 70 mg once weekly.  Continue vitamin D at 1000 units daily (Vitamin D level was checked at lab 07/17/15 and at that time was told to start 1000 units daily).  Start over-the-counter calcium  1000 mg daily and also weightbearing exercise.  E. Colorectal Cancer Screening: She had colon surgery 2011. Was told at that time that "her colon looked good, normal. " No colonoscopy since then.  F. Immunizations:  Influenza:---Agreeable to receive today--Given here 07/17/15 Tetanus:----Will have to review her records. Medicare does not cover this and husband and pt "donot want to pay for anything insurance doesn't cover." Pneumococcal: Will review records to see if she has had this. Zostavax: Wrote on her AVS for her to call insurance regarding coverage/price then call us with that information.   2. Medicare annual wellness visit, subsequent See Below  3. Essential hypertension I repeated BP myself and get 150/90. Will add Benazepril 10mg  QD. She will RTC in 2 weeks to  recheck BP and BMET. - benazepril (LOTENSIN) 10 MG tablet; Take 1 tablet (10 mg total) by mouth daily.  Dispense: 30 tablet; Refill: 0  4. Need for prophylactic vaccination and inoculation against influenza - Flu Vaccine QUAD 36+ mos IM  Subjective:   Patient presents for Medicare Annual/Subsequent preventive examination.   Review Past Medical/Family/Social: These are all reviewed and documented today.  Risk Factors  Current exercise habits: She is active  around the house. No formal exercise. Dietary issues discussed:  She eats low cholesterol, low sodium diet.  Cardiac risk factors: HTN, Age  Depression Screen  (Note: if answer to either of the following is "Yes", a more complete depression screening is indicated)  Over the past two weeks, have you felt down, depressed or hopeless? No Over the past two weeks, have you felt little interest or pleasure in doing things? No Have you lost interest or pleasure in daily life? No Do you often feel hopeless? No Do you cry easily over simple problems? No   Activities of Daily Living  In your present state of health, do you have any difficulty performing the following activities?:  Driving? No  Managing money? No  Feeding yourself? No  Getting from bed to chair? No  Climbing a flight of stairs? No  Preparing food and eating?: No  Bathing or showering? No  Getting dressed: No  Getting to the toilet? No  Using the toilet:No  Moving around from place to place: No  In the past year have you fallen or had a near fall?:No  Are you sexually active? No  Do you have more than one partner? No   Hearing Difficulties: No  Do you often ask people to speak up or repeat themselves? No  Do you experience ringing or noises in your ears? No Do you have difficulty understanding soft or whispered voices? No  Do you feel that you have a problem with memory? No Do you often misplace items? No  Do you feel safe at home? Yes  Cognitive Testing  Alert? Yes Normal Appearance?Yes  Oriented to person? Yes Place? Yes  Time? Yes  Recall of three objects? Yes  Can perform simple calculations? Yes  Displays appropriate judgment?Yes  Can read the correct time from a watch face?Yes   List the Names of Other Physician/Practitioners you currently use:  None  Indicate any recent Medical Services you may have received from other than [facility] providers in the past year (date may be approximate).  None Screening Tests  / Date Colonoscopy                    Colon Surgery 2011 Zostavax             See Note Above Mammogram  See Note Above Influenza Vaccine  See Note Above Tetanus/tdap See Note Above    Assessment:    Annual wellness medicare exam   Plan:    During the course of the visit the patient was educated and counseled about appropriate screening and preventive services including:  Screening mammography  Colorectal cancer screening  Shingles vaccine. Prescription given to that she can get the vaccine at the pharmacy or Medicare part D.  Screen + for depression. PHQ- 9 score of 12 (moderate depression). We discussed the options of counseling versus possibly a medication. I encouraged her strongly think about the counseling. She is going through some medical problems currently and her husband is as well Mrs. been very stressful for her.  She says she will think about it. She does have Xanax to use as needed. Though she may benefit from an SSRI for her more depressive type symptoms but she wants to hold off at this time.  I aksed her to please have her cardioloist send records since we have none on file.  Diet review for nutrition referral? Yes ____ Not Indicated __x__  Patient Instructions (the written plan) was given to the patient.  Medicare Attestation  I have personally reviewed:  The patient's medical and social history  Their use of alcohol, tobacco or illicit drugs  Their current medications and supplements  The patient's functional ability including ADLs,fall risks, home safety risks, cognitive, and hearing and visual impairment  Diet and physical activities  Evidence for depression or mood disorders  The patient's weight, height, BMI, and visual acuity have been recorded in the chart. I have made referrals, counseling, and provided education to the patient based on review of the above and I have provided the patient with a written personalized care plan for preventive services.         Signed, [address] [place], [place], [facility] 07/18/2015 10:54 AM

## 2015-07-20 ENCOUNTER — Telehealth: Payer: Self-pay | Admitting: Family Medicine

## 2015-07-20 DIAGNOSIS — E559 Vitamin D deficiency, unspecified: Secondary | ICD-10-CM | POA: Insufficient documentation

## 2015-07-20 NOTE — Telephone Encounter (Signed)
-----   Message from Orlena Sheldon, PA-C sent at 07/20/2015  7:29 AM EST ----- See the result note attached to Erin Wong prior to calling this result note. Her vitamin D is slightly low. Add vitamin D deficiency to problem list and add over-the-counter vitamin D 1,000 units daily. Remainder of her labs were all normal.

## 2015-07-20 NOTE — Telephone Encounter (Signed)
Pt aware of lab results and provider recommendations 

## 2015-07-25 ENCOUNTER — Encounter: Payer: Self-pay | Admitting: *Deleted

## 2015-07-31 ENCOUNTER — Other Ambulatory Visit: Payer: Self-pay | Admitting: Family Medicine

## 2015-07-31 ENCOUNTER — Ambulatory Visit (INDEPENDENT_AMBULATORY_CARE_PROVIDER_SITE_OTHER): Payer: 59 | Admitting: Physician Assistant

## 2015-07-31 ENCOUNTER — Encounter: Payer: Self-pay | Admitting: Physician Assistant

## 2015-07-31 VITALS — BP 162/96 | HR 80 | Temp 98.4°F | Resp 20 | Wt 152.0 lb

## 2015-07-31 DIAGNOSIS — I10 Essential (primary) hypertension: Secondary | ICD-10-CM

## 2015-07-31 MED ORDER — FLUTICASONE-SALMETEROL 100-50 MCG/DOSE IN AEPB: 1.0000 | INHALATION_SPRAY | Freq: Two times a day (BID) | RESPIRATORY_TRACT | Status: DC

## 2015-07-31 MED ORDER — NAPROXEN 500 MG PO TABS: 500.0000 mg | ORAL_TABLET | Freq: Two times a day (BID) | ORAL | Status: DC

## 2015-07-31 MED ORDER — POLYETHYLENE GLYCOL 3350 17 GM/SCOOP PO POWD: 1.0000 | ORAL | Status: DC

## 2015-07-31 MED ORDER — TRAMADOL HCL 50 MG PO TABS: 100.0000 mg | ORAL_TABLET | Freq: Three times a day (TID) | ORAL | Status: DC

## 2015-07-31 MED ORDER — RANITIDINE HCL 300 MG PO TABS: 300.0000 mg | ORAL_TABLET | Freq: Every day | ORAL | Status: DC

## 2015-07-31 MED ORDER — BENAZEPRIL HCL 20 MG PO TABS: 20.0000 mg | ORAL_TABLET | Freq: Every day | ORAL | Status: DC

## 2015-07-31 MED ORDER — FLUTICASONE FUROATE 27.5 MCG/SPRAY NA SUSP: 2.0000 | Freq: Every day | NASAL | Status: DC

## 2015-07-31 NOTE — Progress Notes (Deleted)
Patient ID: TAMERRA BRENAN MRN: [id number], DOB: 20-Oct-1950, 64 y.o. Date of Encounter: 07/31/2015,   Chief Complaint: Physical (CPE)  HPI: 64 y.o. y/o female  here for CPE.   She has had one prior OV with me--to establish care. See that OV note for documentation regarding her medical diagnoses, etc.   Today she presents for CPE. No complaints.    Review of Systems: Consitutional: No fever, chills, fatigue, night sweats, lymphadenopathy. No significant/unexplained weight changes. Eyes: No visual changes, eye redness, or discharge. ENT/Mouth: No ear pain, sore throat, nasal drainage, or sinus pain. Cardiovascular: No chest pressure,heaviness, tightness or squeezing, even with exertion. No increased shortness of breath or dyspnea on exertion.No palpitations, edema, orthopnea, PND. Respiratory: No cough, hemoptysis, SOB, or wheezing. Gastrointestinal: No anorexia, dysphagia, reflux, pain, nausea, vomiting, hematemesis, diarrhea, constipation, BRBPR, or melena. Breast: No mass, nodules, bulging, or retraction. No skin changes or inflammation. No nipple discharge. No lymphadenopathy. Genitourinary: No dysuria, hematuria, incontinence, vaginal discharge, pruritis, burning, abnormal bleeding, or pain. Musculoskeletal: No decreased ROM, No joint pain or swelling. No significant pain in neck, back, or extremities. Skin: No rash, pruritis, or concerning lesions. Neurological: No headache, dizziness, syncope, seizures, tremors, memory loss, coordination problems, or paresthesias. Psychological: No anxiety, depression, hallucinations, SI/HI. Endocrine: No polydipsia, polyphagia, polyuria, or known diabetes.No increased fatigue. No palpitations/rapid heart rate. No significant/unexplained weight change. All other systems were reviewed and are otherwise negative.  Past Medical History  Diagnosis Date  . Allergy   . Asthma   . Arthritis      Past Surgical History  Procedure Laterality Date    . Colon surgery  09/2009    hole in colon  . Fracture surgery Right 08/02/2011    Right Leg--Plates & Screws  . 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 Abdominal hysterectomy  10/04/1995    Hysterectomy and Bilateral oophorectomy  . 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 albuterol (PROVENTIL HFA;VENTOLIN HFA) 108 (90 BASE) MCG/ACT inhaler Inhale 1 puff into the lungs every 6 (six) hours as needed for wheezing or shortness of breath.    . benazepril (LOTENSIN) 10 MG tablet Take 1 tablet (10 mg total) by mouth daily. 30 tablet 0  . cholecalciferol (VITAMIN D) 1000 UNITS tablet Take 1,000 Units by mouth daily.    . fluticasone (VERAMYST) 27.5 MCG/SPRAY nasal spray Place 2 sprays into the nose daily.    . Fluticasone-Salmeterol (ADVAIR) 100-50 MCG/DOSE AEPB Inhale 1 puff into the lungs 2 (two) times daily. (Patient not taking: Reported on 07/31/2015) 60 each 5  . naproxen (NAPROSYN) 500 MG tablet Take 1 tablet (500 mg total) by mouth 2 (two) times daily with a meal. (Patient not taking: Reported on 07/31/2015) 60 tablet 5  . polyethylene glycol powder (GLYCOLAX/MIRALAX) powder Take 1 Container by mouth 1 day or 1 dose.    . ranitidine (ZANTAC) 300 MG tablet Take 300 mg by mouth at bedtime.    . traMADol (ULTRAM) 50 MG tablet Take 2 tablets (100 mg total) by mouth 3 (three) times daily. (Patient not taking: Reported on 07/31/2015) 120 tablet 2   No facility-administered medications prior to visit.    Allergies: No Known Allergies  Social History   Social History  . Marital Status: Married    Spouse Name: N/A  . Number of Children:  N/A  . Years of Education: N/A   Occupational History  . Not on file.   Social History Main Topics  . Smoking status: Former Smoker    Quit date: 01/10/1991  . Smokeless tobacco: Never Used  . Alcohol Use: No   . Drug Use: No  . Sexual Activity: No   Other Topics Concern  . Not on file   Social History Narrative    Family History  Problem Relation Age of Onset  . Arthritis Mother   . Cancer Mother     Lung Cancer--Had quit smoking for 25 years  . Miscarriages / [place] Mother   . Alcohol abuse Father   . Alcohol abuse Brother   . Asthma Maternal Aunt   . Diabetes Maternal Aunt   . COPD Brother     Physical Exam: Blood pressure 162/96, pulse 80, temperature 98.4 F (36.9 C), temperature source Oral, resp. rate 20, weight 152 lb (68.947 kg)., Body mass index is 26.08 kg/(m^2). General: Well developed, well nourished, WF. Appears in no acute distress. HEENT: Normocephalic, atraumatic. Conjunctiva pink, sclera non-icteric. Pupils 2 mm constricting to 1 mm, round, regular, and equally reactive to light and accomodation. EOMI. Internal auditory canal clear. TMs with good cone of light and without pathology. Nasal mucosa pink. Nares are without discharge. No sinus tenderness. Oral mucosa pink.  Pharynx without exudate.   Neck: Supple. Trachea midline. No thyromegaly. Full ROM. No lymphadenopathy.No Carotid Bruits. Lungs: Clear to auscultation bilaterally without wheezes, rales, or rhonchi. Breathing is of normal effort and unlabored. Cardiovascular: RRR with S1 S2. No murmurs, rubs, or gallops. Distal pulses 2+ symmetrically. No carotid or abdominal bruits. Breast: Symmetrical. No masses. Nipples without discharge. Abdomen: Soft, non-tender, non-distended with normoactive bowel sounds. No hepatosplenomegaly or masses. No rebound/guarding. No CVA tenderness. No hernias.  Genitourinary: Deferred, as she has had hysterectomy and bilateral oophorectomy. Musculoskeletal: Full range of motion and 5/5 strength throughout. Skin: Warm and moist without erythema, ecchymosis, wounds, or rash. Neuro: A+Ox3. CN II-XII grossly intact. Moves all extremities spontaneously. Full sensation throughout.  Normal gait. DTR 2+ throughout upper and lower extremities.  Psych:  Responds to questions appropriately with a normal affect.   Assessment/Plan:  64 y.o. y/o female here for CPE  1. Visit for preventive health examination  A. Screening Labs: - CBC with Differential/Platelet - COMPLETE METABOLIC PANEL WITH GFR - Lipid panel - TSH - VITAMIN D 25 Hydroxy (Vit-D Deficiency, Fractures)  B. Pap: She has had hysterectomy and bilateral oophorectomy. No further pap indicated.  C. Screening Mammogram: She has not had mammogram since moving here. Agreeable for me to order.  D. DEXA/BMD:  She has not had DEXA. Agreeable for me to order.   E. Colorectal Cancer Screening: She had colon surgery 2011. Was told at that time that "her colon looked good, normal. " No colonoscopy since then.  F. Immunizations:  Influenza:---Agreeable to receive today--Given here 07/17/15 Tetanus:----Will have to review her records. Medicare does not cover this and husband and pt "donot want to pay for anything insurance doesn't cover." Pneumococcal: Will review records to see if she has had this. Zostavax: Wrote on her AVS for her to call insurance regarding coverage/price then call us with that information.   2. Medicare annual wellness visit, subsequent See Below  3. Essential hypertension I repeated BP myself and get 150/90. Will add Benazepril 10mg  QD. She will RTC in 2 weeks to recheck BP and BMET. - benazepril (LOTENSIN) 10 MG tablet; Take 1  tablet (10 mg total) by mouth daily.  Dispense: 30 tablet; Refill: 0  4. Need for prophylactic vaccination and inoculation against influenza - Flu Vaccine QUAD 36+ mos IM  Subjective:   Patient presents for Medicare Annual/Subsequent preventive examination.   Review Past Medical/Family/Social: These are all reviewed and documented today.  Risk Factors  Current exercise habits: She is active around the house. No formal exercise. Dietary issues discussed:   She eats low cholesterol, low sodium diet.  Cardiac risk factors: HTN, Age  Depression Screen  (Note: if answer to either of the following is "Yes", a more complete depression screening is indicated)  Over the past two weeks, have you felt down, depressed or hopeless? No Over the past two weeks, have you felt little interest or pleasure in doing things? No Have you lost interest or pleasure in daily life? No Do you often feel hopeless? No Do you cry easily over simple problems? No   Activities of Daily Living  In your present state of health, do you have any difficulty performing the following activities?:  Driving? No  Managing money? No  Feeding yourself? No  Getting from bed to chair? No  Climbing a flight of stairs? No  Preparing food and eating?: No  Bathing or showering? No  Getting dressed: No  Getting to the toilet? No  Using the toilet:No  Moving around from place to place: No  In the past year have you fallen or had a near fall?:No  Are you sexually active? No  Do you have more than one partner? No   Hearing Difficulties: No  Do you often ask people to speak up or repeat themselves? No  Do you experience ringing or noises in your ears? No Do you have difficulty understanding soft or whispered voices? No  Do you feel that you have a problem with memory? No Do you often misplace items? No  Do you feel safe at home? Yes  Cognitive Testing  Alert? Yes Normal Appearance?Yes  Oriented to person? Yes Place? Yes  Time? Yes  Recall of three objects? Yes  Can perform simple calculations? Yes  Displays appropriate judgment?Yes  Can read the correct time from a watch face?Yes   List the Names of Other Physician/Practitioners you currently use:  None  Indicate any recent Medical Services you may have received from other than [facility] providers in the past year (date may be approximate).  None Screening Tests / Date Colonoscopy                    Colon Surgery  2011 Zostavax             See Note Above Mammogram  See Note Above Influenza Vaccine  See Note Above Tetanus/tdap See Note Above    Assessment:    Annual wellness medicare exam   Plan:    During the course of the visit the patient was educated and counseled about appropriate screening and preventive services including:  Screening mammography  Colorectal cancer screening  Shingles vaccine. Prescription given to that she can get the vaccine at the pharmacy or Medicare part D.  Screen + for depression. PHQ- 9 score of 12 (moderate depression). We discussed the options of counseling versus possibly a medication. I encouraged her strongly think about the counseling. She is going through some medical problems currently and her husband is as well Mrs. been very stressful for her. She says she will think about it. She does have Xanax to  use as needed. Though she may benefit from an SSRI for her more depressive type symptoms but she wants to hold off at this time.  I aksed her to please have her cardioloist send records since we have none on file.  Diet review for nutrition referral? Yes ____ Not Indicated __x__  Patient Instructions (the written plan) was given to the patient.  Medicare Attestation  I have personally reviewed:  The patient's medical and social history  Their use of alcohol, tobacco or illicit drugs  Their current medications and supplements  The patient's functional ability including ADLs,fall risks, home safety risks, cognitive, and hearing and visual impairment  Diet and physical activities  Evidence for depression or mood disorders  The patient's weight, height, BMI, and visual acuity have been recorded in the chart. I have made referrals, counseling, and provided education to the patient based on review of the above and I have provided the patient with a written personalized care plan for preventive services.        Marin Olp [place], [place],  [facility] 07/31/2015 2:14 PM

## 2015-07-31 NOTE — Progress Notes (Signed)
Patient ID: Erin Wong MRN: [id number], DOB: May 24, 1951, 64 y.o. Date of Encounter: 07/31/2015, 2:25 PM    Chief Complaint:  Chief Complaint  Patient presents with  . 2 week follow up BP    ran out of old meds, never got refills,  needs refill Tramadol for #180 has not been getting enough  . Medication Refill     HPI: 64 y.o. year old white female year for follow-up of hypertension.  Her last office visit was for complete physical exam with me on 07/17/15. At that time her blood pressure was elevated at 150/90 and I started her on benazepril 10 mg daily. Today she reports that she has been taking the benazepril as directed. No adverse effects.     Home Meds:   Outpatient Prescriptions Prior to Visit  Medication Sig Dispense Refill  . albuterol (PROVENTIL HFA;VENTOLIN HFA) 108 (90 BASE) MCG/ACT inhaler Inhale 1 puff into the lungs every 6 (six) hours as needed for wheezing or shortness of breath.    . cholecalciferol (VITAMIN D) 1000 UNITS tablet Take 1,000 Units by mouth daily.    . benazepril (LOTENSIN) 10 MG tablet Take 1 tablet (10 mg total) by mouth daily. 30 tablet 0  . fluticasone (VERAMYST) 27.5 MCG/SPRAY nasal spray Place 2 sprays into the nose daily.    . Fluticasone-Salmeterol (ADVAIR) 100-50 MCG/DOSE AEPB Inhale 1 puff into the lungs 2 (two) times daily. (Patient not taking: Reported on 07/31/2015) 60 each 5  . naproxen (NAPROSYN) 500 MG tablet Take 1 tablet (500 mg total) by mouth 2 (two) times daily with a meal. (Patient not taking: Reported on 07/31/2015) 60 tablet 5  . polyethylene glycol powder (GLYCOLAX/MIRALAX) powder Take 1 Container by mouth 1 day or 1 dose.    . ranitidine (ZANTAC) 300 MG tablet Take 300 mg by mouth at bedtime.    . traMADol (ULTRAM) 50 MG tablet Take 2 tablets (100 mg total) by mouth 3 (three) times daily. (Patient not taking: Reported on 07/31/2015) 120 tablet 2   No facility-administered medications prior to visit.    Allergies:  No Known Allergies    Review of Systems: See HPI for pertinent ROS. All other ROS negative.    Physical Exam: Blood pressure 162/96, pulse 80, temperature 98.4 F (36.9 C), temperature source Oral, resp. rate 20, weight 152 lb (68.947 kg)., Body mass index is 26.08 kg/(m^2). General:  WNWD WF. Appears in no acute distress. Neck: Supple. No thyromegaly. No lymphadenopathy. Lungs: Clear bilaterally to auscultation without wheezes, rales, or rhonchi. Breathing is unlabored. Heart: Regular rhythm. No murmurs, rubs, or gallops. Msk:  Strength and tone normal for age. Extremities/Skin: Warm and dry. No edema.  Neuro: Alert and oriented X 3. Moves all extremities spontaneously. Gait is normal. CNII-XII grossly in tact. Psych:  Responds to questions appropriately with a normal affect.     ASSESSMENT AND PLAN:  64 y.o. year old female with  1. Essential hypertension I repeated blood pressure myself. I got 146/76 on the left and 158/80 on the right. Will increase benazepril to 20 mg daily. Check lab to monitor. Will have her schedule follow-up visit here in 3 weeks to recheck blood pressure and BMET on increased dose. - BASIC METABOLIC PANEL WITH GFR - benazepril (LOTENSIN) 20 MG tablet; Take 1 tablet (20 mg total) by mouth daily.  Dispense: 30 tablet; Refill: 0  At her visit I have printed a prescription for tramadol # 180+2 refills. I will have staff send  in 3 months supply refills on her other medicines.   Marin Olp [place], [place], [facility] 07/31/2015 2:25 PM

## 2015-07-31 NOTE — Telephone Encounter (Signed)
Medication refilled per protocol. 

## 2015-08-01 LAB — BASIC METABOLIC PANEL WITH GFR
BUN: 21 mg/dL (ref 7–25)
CALCIUM: 9.3 mg/dL (ref 8.6–10.4)
CO2: 25 mmol/L (ref 20–31)
CREATININE: 0.74 mg/dL (ref 0.50–0.99)
Chloride: 100 mmol/L (ref 98–110)
GFR, Est Non African American: 85 mL/min (ref >=60)
Glucose, Bld: 98 mg/dL (ref 70–99)
Potassium: 4.7 mmol/L (ref 3.5–5.3)
SODIUM: 138 mmol/L (ref 135–146)

## 2015-08-07 ENCOUNTER — Telehealth: Payer: Self-pay | Admitting: Family Medicine

## 2015-08-07 NOTE — Telephone Encounter (Signed)
PA submitted for Veramyst (generic Fluticasone) nasal spray  CMM case [id number]

## 2015-08-21 ENCOUNTER — Ambulatory Visit (INDEPENDENT_AMBULATORY_CARE_PROVIDER_SITE_OTHER): Payer: 59 | Admitting: Physician Assistant

## 2015-08-21 ENCOUNTER — Encounter: Payer: Self-pay | Admitting: Physician Assistant

## 2015-08-21 VITALS — BP 138/84 | HR 72 | Temp 98.0°F | Resp 18 | Wt 151.0 lb

## 2015-08-21 DIAGNOSIS — I10 Essential (primary) hypertension: Secondary | ICD-10-CM | POA: Diagnosis not present

## 2015-08-21 MED ORDER — BENAZEPRIL HCL 20 MG PO TABS: 20.0000 mg | ORAL_TABLET | Freq: Every day | ORAL | Status: DC

## 2015-08-21 NOTE — Progress Notes (Signed)
    Patient ID: Erin Wong MRN: [id number], DOB: [birth year], 64 y.o. Date of Encounter: 08/21/2015, 3:32 PM    Chief Complaint:  Chief Complaint  Patient presents with  . 3 week BP check     HPI: 64 y.o. year old white female year for follow-up of hypertension.  She came for complete physical exam with me on 07/17/15. At that time her blood pressure was elevated at 150/90 and I started her on benazepril 10 mg daily. 07/31/15--she came for follow-up office visit. At that visit she reported that she had been taking the benazepril as directed. No adverse effects. At that visit her blood pressure was still elevated so we increased the Benazepril to 20 mg. She presents today for follow-up visit. She is taking the benazepril 20 mg daily. No adverse effects.    Home Meds:   Outpatient Prescriptions Prior to Visit  Medication Sig Dispense Refill  . albuterol (PROVENTIL HFA;VENTOLIN HFA) 108 (90 BASE) MCG/ACT inhaler Inhale 1 puff into the lungs every 6 (six) hours as needed for wheezing or shortness of breath.    . cholecalciferol (VITAMIN D) 1000 UNITS tablet Take 1,000 Units by mouth daily.    . fluticasone (VERAMYST) 27.5 MCG/SPRAY nasal spray Place 2 sprays into the nose daily. 30 g 1  . Fluticasone-Salmeterol (ADVAIR) 100-50 MCG/DOSE AEPB Inhale 1 puff into the lungs 2 (two) times daily. 180 each 1  . naproxen (NAPROSYN) 500 MG tablet Take 1 tablet (500 mg total) by mouth 2 (two) times daily with a meal. 180 tablet 1  . polyethylene glycol powder (GLYCOLAX/MIRALAX) powder Take 255 g by mouth 1 day or 1 dose. 255 g 5  . ranitidine (ZANTAC) 300 MG tablet Take 1 tablet (300 mg total) by mouth at bedtime. 90 tablet 1  . traMADol (ULTRAM) 50 MG tablet Take 2 tablets (100 mg total) by mouth 3 (three) times daily. 180 tablet 2  . benazepril (LOTENSIN) 20 MG tablet Take 1 tablet (20 mg total) by mouth daily. 30 tablet 0   No facility-administered medications prior to visit.     Allergies: No Known Allergies    Review of Systems: See HPI for pertinent ROS. All other ROS negative.    Physical Exam: Blood pressure 138/84, pulse 72, temperature 98 F (36.7 C), temperature source Oral, resp. rate 18, weight 151 lb (68.493 kg)., Body mass index is 25.91 kg/(m^2). General:  WNWD WF. Appears in no acute distress. Neck: Supple. No thyromegaly. No lymphadenopathy. Lungs: Clear bilaterally to auscultation without wheezes, rales, or rhonchi. Breathing is unlabored. Heart: Regular rhythm. No murmurs, rubs, or gallops. Msk:  Strength and tone normal for age. Extremities/Skin: Warm and dry. No edema.  Neuro: Alert and oriented X 3. Moves all extremities spontaneously. Gait is normal. CNII-XII grossly in tact. Psych:  Responds to questions appropriately with a normal affect.     ASSESSMENT AND PLAN:  64 y.o. year old female with  1. Essential hypertension Blood Pressure is now controlled. Will continue benazepril at 20 mg daily. Will recheck lab to monitor at this dose.  Can wait 6 months for routine follow-up visit. Follow-up sooner if needed.   [address] [place], [place], [facility] 08/21/2015 3:32 PM

## 2015-08-22 ENCOUNTER — Encounter: Payer: Self-pay | Admitting: Family Medicine

## 2015-08-22 LAB — BASIC METABOLIC PANEL WITH GFR
BUN: 20 mg/dL (ref 7–25)
CALCIUM: 9.2 mg/dL (ref 8.6–10.4)
CHLORIDE: 101 mmol/L (ref 98–110)
CO2: 26 mmol/L (ref 20–31)
CREATININE: 0.87 mg/dL (ref 0.50–0.99)
GFR, Est African American: 81 mL/min (ref >=60)
GFR, Est Non African American: 70 mL/min (ref >=60)
Glucose, Bld: 88 mg/dL (ref 70–99)
Potassium: 4.9 mmol/L (ref 3.5–5.3)
SODIUM: 137 mmol/L (ref 135–146)

## 2015-08-22 MED ORDER — FLUTICASONE PROPIONATE 50 MCG/ACT NA SUSP: 2.0000 | Freq: Every day | NASAL | Status: DC

## 2015-08-22 NOTE — Telephone Encounter (Signed)
Flonase 1 spray per nostril daily prn # 1 + 11 Refills

## 2015-08-22 NOTE — Telephone Encounter (Signed)
Veramyst denied. Pt needs to have tried and failed all of the following.  Please advise?  Flunisolide (Nasarel)  Fluticasone (flonase)  Nasacort OTC  [facility]

## 2015-08-22 NOTE — Telephone Encounter (Signed)
New rx to pharmacy and pt made aware

## 2015-09-19 ENCOUNTER — Encounter: Payer: Self-pay | Admitting: *Deleted

## 2015-10-13 ENCOUNTER — Ambulatory Visit
Admission: RE | Admit: 2015-10-13 | Discharge: 2015-10-13 | Disposition: A | Discharge status: Home / Self Care | Payer: BLUE CROSS/BLUE SHIELD | Source: Ambulatory Visit | Attending: Physician Assistant | Admitting: Physician Assistant

## 2015-10-13 DIAGNOSIS — Z780 Asymptomatic menopausal state: Secondary | ICD-10-CM

## 2015-10-13 DIAGNOSIS — Z90710 Acquired absence of both cervix and uterus: Secondary | ICD-10-CM

## 2015-10-13 DIAGNOSIS — E2839 Other primary ovarian failure: Secondary | ICD-10-CM

## 2015-10-13 DIAGNOSIS — Z90722 Acquired absence of ovaries, bilateral: Principal | ICD-10-CM

## 2015-10-13 DIAGNOSIS — Z0000 Encounter for general adult medical examination without abnormal findings: Secondary | ICD-10-CM

## 2015-10-13 DIAGNOSIS — Z9079 Acquired absence of other genital organ(s): Principal | ICD-10-CM

## 2015-10-18 ENCOUNTER — Other Ambulatory Visit: Payer: Self-pay | Admitting: Family Medicine

## 2015-10-18 DIAGNOSIS — M810 Age-related osteoporosis without current pathological fracture: Secondary | ICD-10-CM

## 2015-10-18 MED ORDER — CALCIUM 500 MG PO TABS: 600.0000 mg | ORAL_TABLET | Freq: Three times a day (TID) | ORAL | Status: DC

## 2015-10-18 MED ORDER — ALENDRONATE SODIUM 70 MG PO TABS: 70.0000 mg | ORAL_TABLET | ORAL | Status: DC

## 2015-10-30 ENCOUNTER — Other Ambulatory Visit: Payer: Self-pay | Admitting: Physician Assistant

## 2015-10-30 NOTE — Telephone Encounter (Signed)
LRF 07/31/15 # 180  +2   LOV 08/21/15  OK refill?

## 2015-10-30 NOTE — Telephone Encounter (Signed)
rx called in

## 2015-10-30 NOTE — Telephone Encounter (Signed)
Approved. #180+2. 

## 2015-11-01 ENCOUNTER — Telehealth: Payer: Self-pay | Admitting: Physician Assistant

## 2015-11-01 MED ORDER — TRAMADOL HCL 50 MG PO TABS: ORAL_TABLET | ORAL | Status: DC

## 2015-11-01 NOTE — Telephone Encounter (Signed)
Medication called to pharmacy. 

## 2015-11-01 NOTE — Telephone Encounter (Signed)
Patient calling to say when she went to pharmacy yesterday to get her tramadol the instructions did not match, please call her at [phone] if [phone] does not work try [phone]

## 2015-11-01 NOTE — Telephone Encounter (Signed)
Can you change it to 2 po TID-----thanks.

## 2015-11-01 NOTE — Telephone Encounter (Signed)
Call placed to patient.   Reports that she was unable to pick up prescription for Tramadol. States that prescription is supposed to read 2 tabs PO TID PRN, #180. Prescription reads 1 tab PO TID PRN, #180.   Please advise.

## 2015-12-11 ENCOUNTER — Encounter: Payer: Self-pay | Admitting: Physician Assistant

## 2015-12-11 ENCOUNTER — Ambulatory Visit (INDEPENDENT_AMBULATORY_CARE_PROVIDER_SITE_OTHER): Payer: BLUE CROSS/BLUE SHIELD | Admitting: Physician Assistant

## 2015-12-11 ENCOUNTER — Ambulatory Visit
Admission: RE | Admit: 2015-12-11 | Discharge: 2015-12-11 | Disposition: A | Discharge status: Home / Self Care | Payer: BLUE CROSS/BLUE SHIELD | Source: Ambulatory Visit | Attending: Physician Assistant | Admitting: Physician Assistant

## 2015-12-11 VITALS — BP 152/88 | HR 68 | Temp 98.0°F | Resp 18 | Wt 150.0 lb

## 2015-12-11 DIAGNOSIS — R1030 Lower abdominal pain, unspecified: Secondary | ICD-10-CM

## 2015-12-11 DIAGNOSIS — R300 Dysuria: Secondary | ICD-10-CM

## 2015-12-11 DIAGNOSIS — N760 Acute vaginitis: Secondary | ICD-10-CM

## 2015-12-11 LAB — URINALYSIS, MICROSCOPIC ONLY
Bacteria, UA: NONE SEEN [HPF]
CASTS: NONE SEEN [LPF]
CRYSTALS: NONE SEEN [HPF]
Yeast: NONE SEEN [HPF]

## 2015-12-11 LAB — WET PREP FOR TRICH, YEAST, CLUE
Clue Cells Wet Prep HPF POC: NONE SEEN
TRICH WET PREP: NONE SEEN
Yeast Wet Prep HPF POC: NONE SEEN

## 2015-12-11 LAB — URINALYSIS, ROUTINE W REFLEX MICROSCOPIC
Bilirubin Urine: NEGATIVE
GLUCOSE, UA: NEGATIVE
HGB URINE DIPSTICK: NEGATIVE
KETONES UR: NEGATIVE
Nitrite: NEGATIVE
PH: 6 (ref 5.0–8.0)
Protein, ur: NEGATIVE

## 2015-12-11 LAB — CBC
HCT: 38.1 % (ref 35.0–45.0)
HEMOGLOBIN: 12.9 g/dL (ref 12.0–15.0)
MCH: 33.8 pg — AB (ref 27.0–33.0)
MCHC: 33.9 g/dL (ref 32.0–36.0)
MCV: 99.7 fL (ref 80.0–100.0)
PLATELETS: 453 10*3/uL — AB (ref 140–400)
RBC: 3.82 MIL/uL (ref 3.80–5.10)
RDW: 13 % (ref 11.0–15.0)
WBC: 10.2 10*3/uL (ref 3.8–10.8)

## 2015-12-11 NOTE — Progress Notes (Signed)
Patient ID: Erin Wong MRN: [id number], DOB: [birth year], 65 y.o. Date of Encounter: 12/11/2015, 2:30 PM    Chief Complaint:  Chief Complaint  Patient presents with  . Abdominal Pain     HPI: 65 y.o. year old female presents with above.   Her husband is here with her for OV today.  He states that she has been having a burning discomfort and points to her lower abdomen bilaterally. Husband states that they thought it was secondary" to eating spicy foods irritating her colon". I asked why they came to this conclusion and they state that because she noticed having this burning discomfort after eating spicy foods but then when she stopped eating them, the discomfort got better. They state that the discomfort has actually improved over the last couple of days. Then asked further about this burning sensation. She states that she's been having a burning discomfort in her vaginal area recently. She states that she has also noticed odor when she urinates.  She has had no change in bowel habits. Has had no diarrhea. She has chronic history of mild constipation and uses Dulcolax occasionally for this. Says that she does not have to take Dulcolax every day. Says that she has taken a couple in the last week. She says that after she takes this, she does have a bowel movement. Has had no fevers or chills.  Home Meds:   Outpatient Prescriptions Prior to Visit  Medication Sig Dispense Refill  . albuterol (PROVENTIL HFA;VENTOLIN HFA) 108 (90 BASE) MCG/ACT inhaler Inhale 1 puff into the lungs every 6 (six) hours as needed for wheezing or shortness of breath.    Marland Kitchen alendronate (FOSAMAX) 70 MG tablet Take 1 tablet (70 mg total) by mouth every 7 (seven) days. Take with a full glass of water on an empty stomach. 12 tablet 3  . benazepril (LOTENSIN) 20 MG tablet Take 1 tablet (20 mg total) by mouth daily. 90 tablet 1  . Calcium 500 MG tablet Take 1 tablet (500 mg total) by mouth 3 (three) times daily.  90 tablet   . cholecalciferol (VITAMIN D) 1000 UNITS tablet Take 1,000 Units by mouth daily.    . fluticasone (FLONASE) 50 MCG/ACT nasal spray Place 2 sprays into both nostrils daily. 16 g 11  . Fluticasone-Salmeterol (ADVAIR) 100-50 MCG/DOSE AEPB Inhale 1 puff into the lungs 2 (two) times daily. 180 each 1  . naproxen (NAPROSYN) 500 MG tablet Take 1 tablet (500 mg total) by mouth 2 (two) times daily with a meal. 180 tablet 1  . polyethylene glycol powder (GLYCOLAX/MIRALAX) powder Take 255 g by mouth 1 day or 1 dose. 255 g 5  . ranitidine (ZANTAC) 300 MG tablet Take 1 tablet (300 mg total) by mouth at bedtime. 90 tablet 1  . traMADol (ULTRAM) 50 MG tablet TAKE 2 TABLETS BY MOUTH 3 TIMES DAILY 180 tablet 2   No facility-administered medications prior to visit.    Allergies: No Known Allergies    Review of Systems: See HPI for pertinent ROS. All other ROS negative.    Physical Exam: Blood pressure 152/88, pulse 68, temperature 98 F (36.7 C), temperature source Oral, resp. rate 18, weight 150 lb (68.04 kg)., Body mass index is 25.73 kg/(m^2). General:  WNWD WF. Appears in no acute distress. Neck: Supple. No thyromegaly. No lymphadenopathy. Lungs: Clear bilaterally to auscultation without wheezes, rales, or rhonchi. Breathing is unlabored. Heart: Regular rhythm. No murmurs, rubs, or gallops. Abdomen: Soft,  non-distended with normoactive  bowel sounds. No hepatomegaly. No rebound/guarding. No obvious abdominal masses. She has scar secondary to prior surgery. She has very mild discomfort with palpation of bilateral low abdomen.  Msk:  Strength and tone normal for age. Pelvic Exam: External genitalia normal. Vaginal mucosa normal. Bimanual exam normal with no tenderness. No discharge present. No erythema present.  Extremities/Skin: Warm and dry. Neuro: Alert and oriented X 3. Moves all extremities spontaneously. Gait is normal. CNII-XII grossly in tact. Psych:  Responds to questions  appropriately with a normal affect.    Results for orders placed or performed in visit on 12/11/15  WET PREP FOR [place], YEAST, CLUE  Result Value Ref Range   Yeast Wet Prep HPF POC NONE SEEN NONE SEEN   Trich, Wet Prep NONE SEEN NONE SEEN   Clue Cells Wet Prep HPF POC NONE SEEN NONE SEEN   WBC, Wet Prep HPF POC FEW NONE SEEN  Urinalysis, Routine w reflex microscopic (not at [facility])  Result Value Ref Range   Color, Urine YELLOW YELLOW   APPearance CLEAR CLEAR   Specific Gravity, Urine >1.030 1.001 - 1.035   pH 6.0 5.0 - 8.0   Glucose, UA NEGATIVE NEGATIVE   Bilirubin Urine NEGATIVE NEGATIVE   Ketones, ur NEGATIVE NEGATIVE   Hgb urine dipstick NEGATIVE NEGATIVE   Protein, ur NEGATIVE NEGATIVE   Nitrite NEGATIVE NEGATIVE   Leukocytes, UA TRACE (A) NEGATIVE  Urine Microscopic  Result Value Ref Range   WBC, UA 0-5 <=5 WBC/HPF   RBC / HPF 0-2 <=2 RBC/HPF   Squamous Epithelial / LPF 0-5 <=5 HPF   Bacteria, UA NONE SEEN NONE SEEN HPF   Crystals NONE SEEN NONE SEEN HPF   Casts NONE SEEN NONE SEEN LPF   Yeast NONE SEEN NONE SEEN HPF   1. Lower abdominal pain UA and Wet Prep basically normal. Will obtain lab and KUB.  Will f/u with pt once these results available.    - CBC w/[facility] & 3 Part Diff - COMPLETE METABOLIC PANEL WITH GFR - DG Abd 1 View; Future  2. Dysuria - Urine culture - Urinalysis, Routine w reflex microscopic (not at [facility])  3. Vaginitis and vulvovaginitis - WET PREP FOR TRICH, YEAST, CLUE - GC/Chlamydia Probe Amp     Signed, [id number] [place], [place], [facility] 12/11/2015 2:30 PM

## 2015-12-12 LAB — COMPLETE METABOLIC PANEL WITH GFR
ALBUMIN: 4.3 g/dL (ref 3.6–5.1)
ALK PHOS: 77 U/L (ref 33–130)
ALT: 16 U/L (ref 6–29)
AST: 19 U/L (ref 10–35)
BUN: 20 mg/dL (ref 7–25)
CALCIUM: 9.4 mg/dL (ref 8.6–10.4)
CHLORIDE: 101 mmol/L (ref 98–110)
CO2: 23 mmol/L (ref 20–31)
CREATININE: 0.97 mg/dL (ref 0.50–0.99)
GFR, EST AFRICAN AMERICAN: 71 mL/min (ref >=60)
GFR, Est Non African American: 61 mL/min (ref >=60)
Glucose, Bld: 70 mg/dL (ref 70–99)
Potassium: 5.2 mmol/L (ref 3.5–5.3)
Sodium: 137 mmol/L (ref 135–146)
Total Bilirubin: 0.4 mg/dL (ref 0.2–1.2)
Total Protein: 7.2 g/dL (ref 6.1–8.1)

## 2015-12-13 LAB — URINE CULTURE
COLONY COUNT: NO GROWTH
Organism ID, Bacteria: NO GROWTH

## 2016-01-15 ENCOUNTER — Encounter: Payer: Self-pay | Admitting: Physician Assistant

## 2016-01-15 ENCOUNTER — Ambulatory Visit (INDEPENDENT_AMBULATORY_CARE_PROVIDER_SITE_OTHER): Payer: BLUE CROSS/BLUE SHIELD | Admitting: Physician Assistant

## 2016-01-15 VITALS — BP 132/82 | HR 88 | Temp 98.2°F | Resp 18 | Wt 149.0 lb

## 2016-01-15 DIAGNOSIS — K219 Gastro-esophageal reflux disease without esophagitis: Secondary | ICD-10-CM | POA: Diagnosis not present

## 2016-01-15 DIAGNOSIS — K5909 Other constipation: Secondary | ICD-10-CM

## 2016-01-15 DIAGNOSIS — T7840XS Allergy, unspecified, sequela: Secondary | ICD-10-CM | POA: Diagnosis not present

## 2016-01-15 DIAGNOSIS — I10 Essential (primary) hypertension: Secondary | ICD-10-CM | POA: Diagnosis not present

## 2016-01-15 DIAGNOSIS — Z90710 Acquired absence of both cervix and uterus: Secondary | ICD-10-CM | POA: Diagnosis not present

## 2016-01-15 DIAGNOSIS — M810 Age-related osteoporosis without current pathological fracture: Secondary | ICD-10-CM | POA: Diagnosis not present

## 2016-01-15 DIAGNOSIS — K5900 Constipation, unspecified: Secondary | ICD-10-CM | POA: Diagnosis not present

## 2016-01-15 DIAGNOSIS — Z90722 Acquired absence of ovaries, bilateral: Secondary | ICD-10-CM

## 2016-01-15 DIAGNOSIS — E559 Vitamin D deficiency, unspecified: Secondary | ICD-10-CM

## 2016-01-15 DIAGNOSIS — J4520 Mild intermittent asthma, uncomplicated: Secondary | ICD-10-CM

## 2016-01-15 DIAGNOSIS — Z9079 Acquired absence of other genital organ(s): Secondary | ICD-10-CM

## 2016-01-15 DIAGNOSIS — Z9889 Other specified postprocedural states: Secondary | ICD-10-CM | POA: Diagnosis not present

## 2016-01-15 MED ORDER — NAPROXEN 500 MG PO TABS: 500.0000 mg | ORAL_TABLET | Freq: Two times a day (BID) | ORAL | Status: DC

## 2016-01-15 MED ORDER — FLUTICASONE-SALMETEROL 100-50 MCG/DOSE IN AEPB: 1.0000 | INHALATION_SPRAY | Freq: Two times a day (BID) | RESPIRATORY_TRACT | Status: DC

## 2016-01-15 MED ORDER — FLUTICASONE PROPIONATE 50 MCG/ACT NA SUSP: 2.0000 | Freq: Every day | NASAL | Status: DC

## 2016-01-15 MED ORDER — RANITIDINE HCL 300 MG PO TABS: 300.0000 mg | ORAL_TABLET | Freq: Every day | ORAL | Status: DC

## 2016-01-15 MED ORDER — BENAZEPRIL HCL 20 MG PO TABS: 20.0000 mg | ORAL_TABLET | Freq: Every day | ORAL | Status: DC

## 2016-01-15 NOTE — Progress Notes (Signed)
Patient ID: Erin Wong MRN: [id number], DOB: [birth year], 65 y.o. Date of Encounter: @DATE @  Chief Complaint:  Chief Complaint  Patient presents with  . 6 mth follow up/med refills    not fasting    HPI: 65 y.o. year old white female  Presents  01/12/2015: She was seen as a new patient to establish care.  Her husband was also seen that day as a new patient to establish care with [place]. They recently moved here from [facility] [place]. They report that they have family who live here and they were alone in [place] and felt that since they were get older they should live here near family. They have children, nieces, nephews, aunts who live in this area.  She had surgery January 2011 on her colon. They report that they think that she may have possibly swallowed a piece of a nut shell that caused a hole in the colon. Nonetheless they said they that at surgery is just documented that she had a hole and that she had gotten very ill and had to be hospitalized for 28 days. Also says that she had splenectomy during the surgery/ that hospitalization.  Also she had hysterectomy at age 47 with bilateral oophorectomy.  Otherwise medical history as outlined below. No other significant medical problems. No complaints or concerns today.--Just here to establish care.  She had complete physical exam and lab work in November 2015.  07/17/2015:   She had Complete Physical Exam  She had f/u OVs to f/u Hypertension on 07/31/2015 and 08/21/2015.   She had DEXA 10/13/2015. This showed Osteoporosis. Was to take Vit D and also Fosamax.   01/15/2016: She says she has not started Fosamax yet. Says she "was waiting for stomach to straighten out first. Says she will start it next week".  Says she is taking Vit D as directed.  She is taking BP med as directed. No adv effects. No lightheadedness.  Taking Zantac as directed. GERD symptoms controlled with this. Her  breathing is stable with Advair. Not feeling SOB.   Past Medical History  Diagnosis Date  . Allergy   . Asthma   . Arthritis      Home Meds: Outpatient Prescriptions Prior to Visit  Medication Sig Dispense Refill  . albuterol (PROVENTIL HFA;VENTOLIN HFA) 108 (90 BASE) MCG/ACT inhaler Inhale 1 puff into the lungs every 6 (six) hours as needed for wheezing or shortness of breath.    Marland Kitchen alendronate (FOSAMAX) 70 MG tablet Take 1 tablet (70 mg total) by mouth every 7 (seven) days. Take with a full glass of water on an empty stomach. 12 tablet 3  . Calcium 500 MG tablet Take 1 tablet (500 mg total) by mouth 3 (three) times daily. 90 tablet   . cholecalciferol (VITAMIN D) 1000 UNITS tablet Take 1,000 Units by mouth daily.    . polyethylene glycol powder (GLYCOLAX/MIRALAX) powder Take 255 g by mouth 1 day or 1 dose. 255 g 5  . traMADol (ULTRAM) 50 MG tablet TAKE 2 TABLETS BY MOUTH 3 TIMES DAILY 180 tablet 2  . benazepril (LOTENSIN) 20 MG tablet Take 1 tablet (20 mg total) by mouth daily. 90 tablet 1  . fluticasone (FLONASE) 50 MCG/ACT nasal spray Place 2 sprays into both nostrils daily. 16 g 11  . Fluticasone-Salmeterol (ADVAIR) 100-50 MCG/DOSE AEPB Inhale 1 puff into the lungs 2 (two) times daily. 180 each 1  . naproxen (NAPROSYN) 500 MG tablet Take  1 tablet (500 mg total) by mouth 2 (two) times daily with a meal. 180 tablet 1  . ranitidine (ZANTAC) 300 MG tablet Take 1 tablet (300 mg total) by mouth at bedtime. 90 tablet 1   No facility-administered medications prior to visit.    Allergies: No Known Allergies  Social History   Social History  . Marital Status: Married    Spouse Name: N/A  . Number of Children: N/A  . Years of Education: N/A   Occupational History  . Not on file.   Social History Main Topics  . Smoking status: Former Smoker    Quit date: 01/10/1991  . Smokeless tobacco: Never Used  . Alcohol Use: No  . Drug Use: No  . Sexual Activity: No   Other Topics Concern   . Not on file   Social History Narrative    Family History  Problem Relation Age of Onset  . Arthritis Mother   . Cancer Mother     Lung Cancer--Had quit smoking for 25 years  . Miscarriages / [place] Mother   . Alcohol abuse Father   . Alcohol abuse Brother   . Asthma Maternal Aunt   . Diabetes Maternal Aunt   . COPD Brother      Review of Systems:  See HPI for pertinent ROS. All other ROS negative.    Physical Exam: Blood pressure 132/82, pulse 88, temperature 98.2 F (36.8 C), temperature source Oral, resp. rate 18, weight 149 lb (67.586 kg)., Body mass index is 25.56 kg/(m^2). General: WNWD WF. Appears in no acute distress. Neck: Supple. No thyromegaly. No lymphadenopathy. No carotid bruits. Lungs: Clear bilaterally to auscultation without wheezes, rales, or rhonchi. Breathing is unlabored. Heart: RRR with S1 S2. No murmurs, rubs, or gallops. Abdomen: Soft, non-tender, non-distended with normoactive bowel sounds. No hepatomegaly. No rebound/guarding. No obvious abdominal masses. Musculoskeletal:  Strength and tone normal for age. Extremities/Skin: Warm and dry.  No edema. Neuro: Alert and oriented X 3. Moves all extremities spontaneously. Gait is normal. CNII-XII grossly in tact. Psych:  Responds to questions appropriately with a normal affect.     ASSESSMENT AND PLAN:  65 y.o. year old female with  1. Gastroesophageal reflux disease, esophagitis presence not specified Symptoms are controlled with current medication. No change in therapy.  2. Chronic constipation Symptoms are controlled with current medication. No change in therapy.  3. Allergy, sequela Symptoms are controlled with current medications. No change in therapy.  4. Asthma, mild intermittent, uncomplicated Symptoms are controlled with current medications. No change in therapy.  5. History of colon surgery See HPI. Stable.   6. History of total hysterectomy with bilateral salpingo-oophorectomy  (BSO) At past visit--Discussed that She was age 65 when she had hysterectomy with bilateral oophorectomy. At that visit she reported that she has had never had a bone density scan. She is agreeable to have this. - DEXA was performed 10/13/2015---showed Osteoporosis---rec Fosamax and Vit D. At [place] 01/2016--reports she has not started Fosamax yet but will start it. Is taking Vit D.    Vitamin D deficiency She is taking Vit D as directed  9. Osteoporosis At past visit--Discussed that She was age 63 when she had hysterectomy with bilateral oophorectomy. At that visit she reported that she has had never had a bone density scan. She is agreeable to have this. - DEXA was performed 10/13/2015---showed Osteoporosis---rec Fosamax and Vit D. At [place] 01/2016--reports she has not started Fosamax yet but will start it. Is taking Vit D.  For Preventive Care---See CPE note 07/17/2015  Routine f/u OV 6 months, or sooner if needed.   Marin Olp Fate, [place], [facility] 01/15/2016 2:07 PM

## 2016-02-12 ENCOUNTER — Encounter: Payer: Self-pay | Admitting: Family Medicine

## 2016-02-12 ENCOUNTER — Ambulatory Visit (INDEPENDENT_AMBULATORY_CARE_PROVIDER_SITE_OTHER): Payer: BLUE CROSS/BLUE SHIELD | Admitting: Family Medicine

## 2016-02-12 VITALS — BP 140/74 | HR 82 | Temp 98.7°F | Resp 16 | Ht 64.0 in | Wt 148.0 lb

## 2016-02-12 DIAGNOSIS — Z8781 Personal history of (healed) traumatic fracture: Secondary | ICD-10-CM | POA: Diagnosis not present

## 2016-02-12 DIAGNOSIS — M25461 Effusion, right knee: Secondary | ICD-10-CM | POA: Diagnosis not present

## 2016-02-12 DIAGNOSIS — S8991XA Unspecified injury of right lower leg, initial encounter: Secondary | ICD-10-CM | POA: Diagnosis not present

## 2016-02-12 MED ORDER — OXYCODONE-ACETAMINOPHEN 5-325 MG PO TABS: 1.0000 | ORAL_TABLET | Freq: Four times a day (QID) | ORAL | Status: DC | PRN

## 2016-02-12 NOTE — Patient Instructions (Signed)
Take percocet for pain Continue ice and elevation Continue naprosyn  Orthopedic appointment as scheduled F/U as needed

## 2016-02-12 NOTE — Progress Notes (Signed)
Patient ID: Erin Wong, female   DOB: 19-May-1951, 65 y.o.   MRN: [id number]   Subjective:    Patient ID: Erin Wong, female    DOB: 1951/01/09, 65 y.o.   MRN: [id number]  Patient presents for R Knee Pain With right knee pain. She's history of fracture to her right lower leg back in 2012 this was treated with surgery by Dr.Latan in [place]. She has 2 metal plate as well as multiple screws. She's had balance issues since then states her leg is always been a little crooked since the surgery. She is not particularly sure what happened but a week ago she injured her right knee. She noticed swelling directly afterwards. She's been icing and elevating but the pain is increasing. Now she can hardly bend her knee and has difficulty with ambulating without assistance. She is on Naprosyn chronically as well as tramadol because of her arthritis and her previous leg surgery    Review Of Systems:  GEN- denies fatigue, fever, weight loss,weakness, recent illness HEENT- denies eye drainage, change in vision, nasal discharge, CVS- denies chest pain, palpitations RESP- denies SOB, cough, wheeze MSK- +joint pain, muscle aches, injury Neuro- denies headache, dizziness, syncope, seizure activity       Objective:    BP 140/74 mmHg  Pulse 82  Temp(Src) 98.7 F (37.1 C) (Oral)  Resp 16  Ht 5\' 4"  (1.626 m)  Wt 148 lb (67.132 kg)  BMI 25.39 kg/m2 GEN- NAD, alert and oriented x3 MSK- Right knee + effusion, TTP inferior and popliteal , decreased ROM, pain with twisting motions, well healed scars right lower leg, leg not aligned with knee (chronc)  EXT- No edema Pulses- Radial, DP- 2+        Assessment & Plan:      Problem List Items Addressed This Visit    None    Visit Diagnoses    Right knee injury, initial encounter    -  Primary    Continue naprosyn, ICE, refer to ortho, concern for possible meniscal injury, alrady has extensive hardware in lower leg as well, given Percocet for pain      Relevant Orders    Ambulatory referral to Orthopedic Surgery    Knee effusion, right        Relevant Orders    Ambulatory referral to Orthopedic Surgery    History of lower leg fracture        Has plates and screws in right leg, chronic pain control, affects balance     Relevant Orders    Ambulatory referral to Orthopedic Surgery       Note: This dictation was prepared with Dragon dictation along with smaller phrase technology. Any transcriptional errors that result from this process are unintentional.

## 2016-02-20 ENCOUNTER — Other Ambulatory Visit: Payer: Self-pay | Admitting: Physician Assistant

## 2016-02-20 MED ORDER — TRAMADOL HCL 50 MG PO TABS: ORAL_TABLET | ORAL | Status: DC

## 2016-02-20 NOTE — Telephone Encounter (Signed)
LRF 11/01/15 #180  + 2   LOV 02/12/16  OK refill?

## 2016-02-20 NOTE — Telephone Encounter (Signed)
Pt is requesting a refill of Tramadol 50 mg [employer]

## 2016-02-20 NOTE — Telephone Encounter (Signed)
Approved. #180+2. 

## 2016-02-20 NOTE — Telephone Encounter (Signed)
rx called in

## 2016-02-21 NOTE — Telephone Encounter (Signed)
Duplicate request for Tramadol sent back denied, just called in yesterday

## 2016-03-04 DIAGNOSIS — M1711 Unilateral primary osteoarthritis, right knee: Secondary | ICD-10-CM | POA: Diagnosis not present

## 2016-06-05 ENCOUNTER — Other Ambulatory Visit: Payer: Self-pay | Admitting: Physician Assistant

## 2016-06-05 NOTE — Telephone Encounter (Signed)
Approved. #180+2. 

## 2016-06-05 NOTE — Telephone Encounter (Signed)
Last OV 02-11-26 Last refill 02-20-16 Ok to refill?

## 2016-06-05 NOTE — Telephone Encounter (Signed)
RX refilled / called in

## 2016-06-06 DIAGNOSIS — Z23 Encounter for immunization: Secondary | ICD-10-CM | POA: Diagnosis not present

## 2016-07-04 ENCOUNTER — Ambulatory Visit (INDEPENDENT_AMBULATORY_CARE_PROVIDER_SITE_OTHER): Payer: Medicare Other | Admitting: Family Medicine

## 2016-07-04 ENCOUNTER — Encounter: Payer: Self-pay | Admitting: Family Medicine

## 2016-07-04 VITALS — BP 152/84 | HR 82 | Temp 98.1°F | Resp 14 | Ht 64.0 in | Wt 147.0 lb

## 2016-07-04 DIAGNOSIS — Z8781 Personal history of (healed) traumatic fracture: Secondary | ICD-10-CM | POA: Diagnosis not present

## 2016-07-04 DIAGNOSIS — M25561 Pain in right knee: Secondary | ICD-10-CM

## 2016-07-04 NOTE — Progress Notes (Signed)
Subjective:    Patient ID: Erin Wong, female    DOB: 12-03-1950, 65 y.o.   MRN: [id number]  HPI  Patient presents today with right knee pain. She has a previous history of a fracture in her lower leg status post ORIF of the tibia and fibula. Afterwards she has had chronic right knee pain that waxes and wanes. She is taking naproxen with very little relief. She is requesting a cortisone injection in her right knee. She is receiving this from time to time and has received substantial pain relief from these. Pain is primarily located anteriorly in the medial and lateral compartments. There is a slight effusion on exam today. There is no laxity to varus or valgus stress.  Past Medical History:  Diagnosis Date  . Allergy   . Arthritis   . Asthma    Past Surgical History:  Procedure Laterality Date  . ABDOMINAL HYSTERECTOMY  10/04/1995   Hysterectomy and Bilateral oophorectomy  . COLON SURGERY  09/2009   hole in colon  . FRACTURE SURGERY Right 08/02/2011   Right Leg--Plates & Screws  . 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 albuterol (PROVENTIL HFA;VENTOLIN HFA) 108 (90 BASE) MCG/ACT inhaler Inhale 1 puff into the lungs every 6 (six) hours as needed for wheezing or shortness of breath.    Marland Kitchen alendronate (FOSAMAX) 70 MG tablet Take 1 tablet (70 mg total) by mouth every 7 (seven) days. Take with a full glass of water on an empty stomach. 12 tablet 3  . benazepril (LOTENSIN) 20 MG tablet Take 1 tablet (20 mg total) by mouth daily. 90 tablet 1  . Calcium 500 MG tablet Take 1 tablet (500 mg total) by mouth 3 (three) times daily. 90 tablet   . cholecalciferol (VITAMIN D) 1000 UNITS tablet Take 1,000 Units by mouth daily.    . fluticasone (FLONASE) 50 MCG/ACT nasal spray Place 2 sprays into both nostrils daily.  16 g 11  . Fluticasone-Salmeterol (ADVAIR) 100-50 MCG/DOSE AEPB Inhale 1 puff into the lungs 2 (two) times daily. 180 each 1  . naproxen (NAPROSYN) 500 MG tablet Take 1 tablet (500 mg total) by mouth 2 (two) times daily with a meal. 180 tablet 1  . oxyCODONE-acetaminophen (ROXICET) 5-325 MG tablet Take 1 tablet by mouth every 6 (six) hours as needed for severe pain. 30 tablet 0  . polyethylene glycol powder (GLYCOLAX/MIRALAX) powder Take 255 g by mouth 1 day or 1 dose. 255 g 5  . ranitidine (ZANTAC) 300 MG tablet Take 1 tablet (300 mg total) by mouth at bedtime. 90 tablet 1  . traMADol (ULTRAM) 50 MG tablet TAKE TWO TABLETS BY MOUTH THREE TIMES DAILY 180 tablet 2   No current facility-administered medications on file prior to visit.    No Known Allergies Social History   Social History  . Marital status: Married    Spouse name: N/A  . Number of children: N/A  . Years of education: N/A   Occupational History  . Not on file.   Social History Main Topics  . Smoking status: Former Smoker    Quit date: 01/10/1991  . Smokeless tobacco: Never Used  . Alcohol use No  . Drug use: No  . Sexual activity: No   Other Topics Concern  . Not on file  Social History Narrative  . No narrative on file     Review of Systems  All other systems reviewed and are negative.      Objective:   Physical Exam  Constitutional: She appears well-developed and well-nourished.  Cardiovascular: Normal rate, regular rhythm and normal heart sounds.  Exam reveals no gallop and no friction rub.   No murmur heard. Pulmonary/Chest: Effort normal and breath sounds normal. No respiratory distress. She has no wheezes. She has no rales.  Abdominal: Soft. Bowel sounds are normal. She exhibits no distension and no mass. There is no tenderness. There is no rebound and no guarding.  Musculoskeletal:       Right knee: She exhibits decreased range of motion and effusion. Tenderness found. Medial joint line and  lateral joint line tenderness noted.  Vitals reviewed.         Assessment & Plan:   History of lower leg fracture  Acute pain of right knee  I believe the patient has right knee pain secondary to osteoarthritis. We discussed the risk of septic arthritis secondary to a cortisone shot and the patient wishes to proceed. Using sterile technique, I injected the right knee using a medial approach with 2 mL of lidocaine, 2 mL of Marcaine, and 2 mL of 40 mg per mL Kenalog. The patient tolerated the procedure well without complication

## 2016-07-22 ENCOUNTER — Ambulatory Visit: Payer: BLUE CROSS/BLUE SHIELD | Admitting: Physician Assistant

## 2016-08-01 ENCOUNTER — Ambulatory Visit (INDEPENDENT_AMBULATORY_CARE_PROVIDER_SITE_OTHER): Payer: Medicare Other | Admitting: Physician Assistant

## 2016-08-01 ENCOUNTER — Encounter: Payer: Self-pay | Admitting: Physician Assistant

## 2016-08-01 VITALS — BP 132/80 | HR 71 | Temp 98.0°F | Resp 18 | Wt 144.0 lb

## 2016-08-01 DIAGNOSIS — M810 Age-related osteoporosis without current pathological fracture: Secondary | ICD-10-CM

## 2016-08-01 DIAGNOSIS — Z23 Encounter for immunization: Secondary | ICD-10-CM | POA: Diagnosis not present

## 2016-08-01 DIAGNOSIS — I10 Essential (primary) hypertension: Secondary | ICD-10-CM

## 2016-08-01 DIAGNOSIS — E559 Vitamin D deficiency, unspecified: Secondary | ICD-10-CM | POA: Diagnosis not present

## 2016-08-01 DIAGNOSIS — Z0000 Encounter for general adult medical examination without abnormal findings: Secondary | ICD-10-CM

## 2016-08-01 LAB — LIPID PANEL
CHOL/HDL RATIO: 2.2 ratio (ref <5.0)
Cholesterol: 263 mg/dL — ABNORMAL HIGH (ref <200)
HDL: 119 mg/dL (ref >50)
LDL Cholesterol: 121 mg/dL — ABNORMAL HIGH (ref <100)
Triglycerides: 115 mg/dL (ref <150)
VLDL: 23 mg/dL (ref <30)

## 2016-08-01 LAB — CBC WITH DIFFERENTIAL/PLATELET
BASOS PCT: 1 %
Basophils Absolute: 92 cells/uL (ref 0–200)
EOS PCT: 3 %
Eosinophils Absolute: 276 cells/uL (ref 15–500)
HCT: 38.8 % (ref 35.0–45.0)
HEMOGLOBIN: 12.8 g/dL (ref 12.0–15.0)
LYMPHS ABS: 2024 {cells}/uL (ref 850–3900)
Lymphocytes Relative: 22 %
MCH: 33.4 pg — ABNORMAL HIGH (ref 27.0–33.0)
MCHC: 33 g/dL (ref 32.0–36.0)
MCV: 101.3 fL — ABNORMAL HIGH (ref 80.0–100.0)
MPV: 9.7 fL (ref 7.5–12.5)
Monocytes Absolute: 1380 cells/uL — ABNORMAL HIGH (ref 200–950)
Monocytes Relative: 15 %
NEUTROS ABS: 5428 {cells}/uL (ref 1500–7800)
Neutrophils Relative %: 59 %
Platelets: 486 10*3/uL — ABNORMAL HIGH (ref 140–400)
RBC: 3.83 MIL/uL (ref 3.80–5.10)
RDW: 13.5 % (ref 11.0–15.0)
WBC: 9.2 10*3/uL (ref 3.8–10.8)

## 2016-08-01 LAB — COMPLETE METABOLIC PANEL WITH GFR
ALBUMIN: 4.3 g/dL (ref 3.6–5.1)
ALK PHOS: 64 U/L (ref 33–130)
ALT: 17 U/L (ref 6–29)
AST: 18 U/L (ref 10–35)
BUN: 24 mg/dL (ref 7–25)
CO2: 24 mmol/L (ref 20–31)
Calcium: 9.5 mg/dL (ref 8.6–10.4)
Chloride: 102 mmol/L (ref 98–110)
Creat: 1.22 mg/dL — ABNORMAL HIGH (ref 0.50–0.99)
GFR, Est African American: 53 mL/min — ABNORMAL LOW (ref >=60)
GFR, Est Non African American: 46 mL/min — ABNORMAL LOW (ref >=60)
GLUCOSE: 78 mg/dL (ref 70–99)
POTASSIUM: 4.5 mmol/L (ref 3.5–5.3)
SODIUM: 138 mmol/L (ref 135–146)
TOTAL PROTEIN: 7 g/dL (ref 6.1–8.1)
Total Bilirubin: 0.4 mg/dL (ref 0.2–1.2)

## 2016-08-01 MED ORDER — IBANDRONATE SODIUM 150 MG PO TABS: 150.0000 mg | ORAL_TABLET | ORAL | 3 refills | Status: DC

## 2016-08-01 NOTE — Addendum Note (Signed)
Addended by: Vonna Kotyk A on: 08/01/2016 10:21 AM   Modules accepted: Orders

## 2016-08-01 NOTE — Progress Notes (Signed)
Patient ID: Erin Wong MRN: [id number], DOB: 05/01/1951, 65 y.o. Date of Encounter: 08/01/2016,   Chief Complaint: Physical (CPE)  HPI: 65 y.o. y/o female  here for CPE.   Today she presents for CPE. No complaints.   When I got to the part where I was reviewing that she had had a DEXA scan and that I have recommended Fosamax she reports that she tried taking it more than once and she followed the instructions as far as sitting upright drinking water with it etc. but she vomited every time she took it. So she is just taking her calcium and vitamin D.  No other complaints or concerns.   Review of Systems: Consitutional: No fever, chills, fatigue, night sweats, lymphadenopathy. No significant/unexplained weight changes. Eyes: No visual changes, eye redness, or discharge. ENT/Mouth: No ear pain, sore throat, nasal drainage, or sinus pain. Cardiovascular: No chest pressure,heaviness, tightness or squeezing, even with exertion. No increased shortness of breath or dyspnea on exertion.No palpitations, edema, orthopnea, PND. Respiratory: No cough, hemoptysis, SOB, or wheezing. Gastrointestinal: No anorexia, dysphagia, reflux, pain, nausea, vomiting, hematemesis, diarrhea, constipation, BRBPR, or melena. Breast: No mass, nodules, bulging, or retraction. No skin changes or inflammation. No nipple discharge. No lymphadenopathy. Genitourinary: No dysuria, hematuria, incontinence, vaginal discharge, pruritis, burning, abnormal bleeding, or pain. Musculoskeletal: No decreased ROM, No joint pain or swelling. No significant pain in neck, back, or extremities. Skin: No rash, pruritis, or concerning lesions. Neurological: No headache, dizziness, syncope, seizures, tremors, memory loss, coordination problems, or paresthesias. Psychological: No anxiety, depression, hallucinations, SI/HI. Endocrine: No polydipsia, polyphagia, polyuria, or known diabetes.No increased fatigue. No palpitations/rapid heart  rate. No significant/unexplained weight change. All other systems were reviewed and are otherwise negative.  Past Medical History:  Diagnosis Date  . Allergy   . Arthritis   . Asthma      Past Surgical History:  Procedure Laterality Date  . ABDOMINAL HYSTERECTOMY  10/04/1995   Hysterectomy and Bilateral oophorectomy  . COLON SURGERY  09/2009   hole in colon  . FRACTURE SURGERY Right 08/02/2011   Right Leg--Plates & Screws  . 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   at time of colon surgery    Home Meds:  Outpatient Medications Prior to Visit  Medication Sig Dispense Refill  . albuterol (PROVENTIL HFA;VENTOLIN HFA) 108 (90 BASE) MCG/ACT inhaler Inhale 1 puff into the lungs every 6 (six) hours as needed for wheezing or shortness of breath.    . benazepril (LOTENSIN) 20 MG tablet Take 1 tablet (20 mg total) by mouth daily. 90 tablet 1  . Calcium 500 MG tablet Take 1 tablet (500 mg total) by mouth 3 (three) times daily. 90 tablet   . cholecalciferol (VITAMIN D) 1000 UNITS tablet Take 1,000 Units by mouth daily.    . fluticasone (FLONASE) 50 MCG/ACT nasal spray Place 2 sprays into both nostrils daily. 16 g 11  . Fluticasone-Salmeterol (ADVAIR) 100-50 MCG/DOSE AEPB Inhale 1 puff into the lungs 2 (two) times daily. 180 each 1  . naproxen (NAPROSYN) 500 MG tablet Take 1 tablet (500 mg total) by mouth 2 (two) times daily with a meal. 180 tablet 1  . ranitidine (ZANTAC) 300 MG tablet Take 1 tablet (300 mg total) by mouth at bedtime. 90 tablet 1  . traMADol (ULTRAM) 50 MG tablet TAKE TWO TABLETS BY MOUTH THREE TIMES DAILY 180 tablet 2  . alendronate (FOSAMAX) 70 MG tablet Take 1  tablet (70 mg total) by mouth every 7 (seven) days. Take with a full glass of water on an empty stomach. (Patient not taking: Reported on 08/01/2016) 12 tablet 3  . oxyCODONE-acetaminophen (ROXICET) 5-325 MG tablet Take 1 tablet by mouth every 6 (six)  hours as needed for severe pain. (Patient not taking: Reported on 08/01/2016) 30 tablet 0  . polyethylene glycol powder (GLYCOLAX/MIRALAX) powder Take 255 g by mouth 1 day or 1 dose. (Patient not taking: Reported on 08/01/2016) 255 g 5   No facility-administered medications prior to visit.     Allergies: No Known Allergies  Social History   Social History  . Marital status: Married    Spouse name: N/A  . Number of children: N/A  . Years of education: N/A   Occupational History  . Not on file.   Social History Main Topics  . Smoking status: Former Smoker    Quit date: 01/10/1991  . Smokeless tobacco: Never Used  . Alcohol use No  . Drug use: No  . Sexual activity: No   Other Topics Concern  . Not on file   Social History Narrative  . No narrative on file    Family History  Problem Relation Age of Onset  . Arthritis Mother   . Cancer Mother     Lung Cancer--Had quit smoking for 25 years  . Miscarriages / [place] Mother   . Alcohol abuse Father   . Alcohol abuse Brother   . Asthma Maternal Aunt   . Diabetes Maternal Aunt   . COPD Brother     Physical Exam: Blood pressure 132/80, pulse 71, temperature 98 F (36.7 C), temperature source Oral, resp. rate 18, weight 144 lb (65.3 kg), SpO2 98 %., Body mass index is 24.72 kg/m. General: Well developed, well nourished, WF. Appears in no acute distress. HEENT: Normocephalic, atraumatic. Conjunctiva pink, sclera non-icteric. Pupils 2 mm constricting to 1 mm, round, regular, and equally reactive to light and accomodation. EOMI. Internal auditory canal clear. TMs with good cone of light and without pathology. Nasal mucosa pink. Nares are without discharge. No sinus tenderness. Oral mucosa pink.  Pharynx without exudate.   Neck: Supple. Trachea midline. No thyromegaly. Full ROM. No lymphadenopathy.No Carotid Bruits. Lungs: Clear to auscultation bilaterally without wheezes, rales, or rhonchi. Breathing is of normal effort and  unlabored. Cardiovascular: RRR with S1 S2. No murmurs, rubs, or gallops. Distal pulses 2+ symmetrically. No carotid or abdominal bruits. Breast: Symmetrical. No masses. Nipples without discharge. Abdomen: Soft, non-tender, non-distended with normoactive bowel sounds. No hepatosplenomegaly or masses. No rebound/guarding. No CVA tenderness. No hernias. She has had colon surgery. She has scar and protrusion around that area. Pelvic Exam: External genitalia normal. Vaginal mucosa normal. She has had hysterectomy and oophorectomy. Exam consistent with this.  Genitourinary: Deferred, as she has had hysterectomy and bilateral oophorectomy. Musculoskeletal: Full range of motion and 5/5 strength throughout. Skin: Warm and moist without erythema, ecchymosis, wounds, or rash. Neuro: A+Ox3. CN II-XII grossly intact. Moves all extremities spontaneously. Full sensation throughout. Normal gait. DTR 2+ throughout upper and lower extremities.  Psych:  Responds to questions appropriately with a normal affect.   Assessment/Plan:  65 y.o. y/o female here for CPE  1. Visit for preventive health examination  A. Screening Labs: - CBC with Differential/Platelet - COMPLETE METABOLIC PANEL WITH GFR - Lipid panel - TSH - VITAMIN D 25 Hydroxy (Vit-D Deficiency, Fractures)  B. Pap: She has had hysterectomy and bilateral oophorectomy. No further pap indicated.  C. Screening Mammogram: She had mammogram 11/01/2015--Negative  D. DEXA/BMD:  DEXA performed 10/13/15--- shows Osteoporosis. T-scores -2.7 and -2.4. Told her to start Fosamax 70 mg once weekly.  Continue vitamin D at 1000 units daily (Vitamin D level was checked at lab 07/17/15 and at that time was told to start 1000 units daily).  Start over-the-counter calcium  1000 mg daily and also weightbearing exercise.  08/01/2016 she reports that Fosamax caused vomiting. She has discontinued this. At this time I am changing her to [facility] I have told her to call  us and inform us if she has adverse effects with this.  E. Colorectal Cancer Screening: She had colon surgery 2011. Was told at that time that "her colon looked good, normal. " No colonoscopy since then.  F. Immunizations:  Influenza:---Given here 07/17/15----2017--- she reports that she had this at the pharmacy this season. Tetanus:----Will have to review her records. Medicare does not cover this and husband and pt "donot want to pay for anything insurance doesn't cover." Pneumococcal:  At last CPE I planned to obtain her records and follow-up. I have no record that she has had pneumonia vaccine. Will go ahead and give Pneumovax 23 today--08/01/2016.  ----------------------- Will give the Prevnar 13--- 6-12 months later.-------------------------- Zostavax: 07/2015---Wrote on her AVS for her to call insurance regarding coverage/price then call us with that information.  08/01/2016----patient states that they forgot to call insurance to find out coverage of this.  2. Medicare annual wellness visit, subsequent See Below   3. Age-related osteoporosis without current pathological fracture - VITAMIN D 25 Hydroxy (Vit-D Deficiency, Fractures) - ibandronate (BONIVA) 150 MG tablet; Take 1 tablet (150 mg total) by mouth every 30 (thirty) days. Take in the morning with a full glass of water, on an empty stomach, and do not take anything else by mouth or lie down for the next 30 min.  Dispense: 3 tablet; Refill: 3  4. Vitamin D deficiency - VITAMIN D 25 Hydroxy (Vit-D Deficiency, Fractures)  5. Essential hypertension BP is controlled / at goal. Check lab to monitor.  - COMPLETE METABOLIC PANEL WITH GFR   Subjective:   Patient presents for Medicare Annual/Subsequent preventive examination.   Review Past Medical/Family/Social: These are all reviewed and documented today.  Risk Factors  Current exercise habits: She is active around the house. No formal exercise. Dietary issues discussed:  She  eats low cholesterol, low sodium diet.  Cardiac risk factors: HTN, Age  Depression Screen  (Note: if answer to either of the following is "Yes", a more complete depression screening is indicated)  Over the past two weeks, have you felt down, depressed or hopeless? No Over the past two weeks, have you felt little interest or pleasure in doing things? No Have you lost interest or pleasure in daily life? No Do you often feel hopeless? No Do you cry easily over simple problems? No   Activities of Daily Living  In your present state of health, do you have any difficulty performing the following activities?:  Driving? No  Managing money? No  Feeding yourself? No  Getting from bed to chair? No  Climbing a flight of stairs? No  Preparing food and eating?: No  Bathing or showering? No  Getting dressed: No  Getting to the toilet? No  Using the toilet:No  Moving around from place to place: No  In the past year have you fallen or had a near fall?:No  Are you sexually active? No  Do you have more  than one partner? No   Hearing Difficulties: No  Do you often ask people to speak up or repeat themselves? No  Do you experience ringing or noises in your ears? No Do you have difficulty understanding soft or whispered voices? No  Do you feel that you have a problem with memory? No Do you often misplace items? No  Do you feel safe at home? Yes  Cognitive Testing  Alert? Yes Normal Appearance?Yes  Oriented to person? Yes Place? Yes  Time? Yes  Recall of three objects? Yes  Can perform simple calculations? Yes  Displays appropriate judgment?Yes  Can read the correct time from a watch face?Yes   List the Names of Other Physician/Practitioners you currently use:  None  Indicate any recent Medical Services you may have received from other than [facility] providers in the past year (date may be approximate).  None Screening Tests / Date Colonoscopy                    Colon Surgery 2011 Zostavax              See Note Above Mammogram  See Note Above Influenza Vaccine  See Note Above Tetanus/tdap See Note Above    Assessment:    Annual wellness medicare exam   Plan:    During the course of the visit the patient was educated and counseled about appropriate screening and preventive services including:  Screening mammography  Colorectal cancer screening  Shingles vaccine. Prescription given to that she can get the vaccine at the pharmacy or Medicare part D.  Screen + for depression. PHQ- 9 score of 12 (moderate depression). We discussed the options of counseling versus possibly a medication. I encouraged her strongly think about the counseling. She is going through some medical problems currently and her husband is as well Mrs. been very stressful for her. She says she will think about it. She does have Xanax to use as needed. Though she may benefit from an SSRI for her more depressive type symptoms but she wants to hold off at this time.  I aksed her to please have her cardioloist send records since we have none on file.  Diet review for nutrition referral? Yes ____ Not Indicated __x__  Patient Instructions (the written plan) was given to the patient.  Medicare Attestation  I have personally reviewed:  The patient's medical and social history  Their use of alcohol, tobacco or illicit drugs  Their current medications and supplements  The patient's functional ability including ADLs,fall risks, home safety risks, cognitive, and hearing and visual impairment  Diet and physical activities  Evidence for depression or mood disorders  The patient's weight, height, BMI, and visual acuity have been recorded in the chart. I have made referrals, counseling, and provided education to the patient based on review of the above and I have provided the patient with a written personalized care plan for preventive services.        Signed, [id number] [place], [place], [facility] 08/01/2016 8:52 AM

## 2016-08-02 ENCOUNTER — Telehealth: Payer: Self-pay | Admitting: *Deleted

## 2016-08-02 ENCOUNTER — Other Ambulatory Visit: Payer: Self-pay

## 2016-08-02 DIAGNOSIS — I10 Essential (primary) hypertension: Secondary | ICD-10-CM

## 2016-08-02 DIAGNOSIS — M810 Age-related osteoporosis without current pathological fracture: Secondary | ICD-10-CM

## 2016-08-02 LAB — TSH: TSH: 1.63 m[IU]/L

## 2016-08-02 LAB — VITAMIN D 25 HYDROXY (VIT D DEFICIENCY, FRACTURES): Vit D, 25-Hydroxy: 36 ng/mL (ref 30–100)

## 2016-08-02 MED ORDER — IBANDRONATE SODIUM 150 MG PO TABS: ORAL_TABLET | ORAL | 3 refills | Status: DC

## 2016-08-02 NOTE — Telephone Encounter (Signed)
Received request from pharmacy for [place] [facility] [address].   PA submitted.   Dx: Osteoporosis.   Your information has been submitted to [place] Medicare Part D. [employer] will review the request and will issue a decision, typically within 1-3 days from your submission. If [employer] has not responded in 1-3 days or if you have any questions about your ePA request, please contact [place] Medicare Part D at [phone].

## 2016-08-02 NOTE — Telephone Encounter (Signed)
Received PA determination.   PA Approved 05/04/2016 - 08/02/2017.   Pharmacy made aware.

## 2016-08-02 NOTE — Telephone Encounter (Signed)
Sh was just here for a visit with me yesterday. Did not ask to pick up prescription for pain pill while she was here. She may not actually be out of these or need these right now. Call patient and find out whether she needs refill on tramadol and other pain medicine currently.

## 2016-08-02 NOTE — Telephone Encounter (Signed)
Pt states she forgot to tell you about her medications 11-30 but she does need the tramadol

## 2016-08-02 NOTE — Telephone Encounter (Signed)
May  refill tramadol for #180+2

## 2016-08-02 NOTE — Telephone Encounter (Signed)
Last Ov 12-1 Last refill Oxycodone 6-12 Last refill Tramadol 10-4 Ok to refill?

## 2016-08-05 ENCOUNTER — Telehealth: Payer: Self-pay

## 2016-08-05 MED ORDER — FLUTICASONE PROPIONATE 50 MCG/ACT NA SUSP: 2.0000 | Freq: Every day | NASAL | 11 refills | Status: DC

## 2016-08-05 MED ORDER — TRAMADOL HCL 50 MG PO TABS
100.0000 mg | ORAL_TABLET | Freq: Three times a day (TID) | ORAL | 2 refills | Status: DC
Start: 2016-08-05 — End: 2016-09-17

## 2016-08-05 MED ORDER — VITAMIN D 1000 UNITS PO TABS: 1000.0000 [IU] | ORAL_TABLET | Freq: Every day | ORAL | 1 refills | Status: DC

## 2016-08-05 MED ORDER — BENAZEPRIL HCL 20 MG PO TABS: 20.0000 mg | ORAL_TABLET | Freq: Every day | ORAL | 1 refills | Status: DC

## 2016-08-05 MED ORDER — FLUTICASONE-SALMETEROL 100-50 MCG/DOSE IN AEPB: 1.0000 | INHALATION_SPRAY | Freq: Two times a day (BID) | RESPIRATORY_TRACT | 1 refills | Status: DC

## 2016-08-05 MED ORDER — POLYETHYLENE GLYCOL 3350 17 GM/SCOOP PO POWD: 1.0000 | ORAL | 5 refills | Status: DC

## 2016-08-05 MED ORDER — CALCIUM 500 MG PO TABS: 500.0000 mg | ORAL_TABLET | Freq: Three times a day (TID) | ORAL | Status: DC

## 2016-08-05 NOTE — Telephone Encounter (Signed)
Pt lvm stating Boniva was to expensive and is req cheaper alt. Will cal insu company 12-5 to find a cheaper alt.Mess was left for pt on 12-4

## 2016-08-05 NOTE — Telephone Encounter (Signed)
Rx filled per protocol  

## 2016-08-06 NOTE — Telephone Encounter (Signed)
Spoke with [facility] regarding pt ibandronate Rx Alexis informed me that this Rx should only be $50.00 out of pocket and that this medication was not a 348.00 medication.   Pt states Erin Wong provided her with the price I informed customer to contact [facility] and make sure they have the right ins info and is filling the correct medication.  Pt states she will contact [facility]

## 2016-09-17 ENCOUNTER — Other Ambulatory Visit: Payer: Self-pay | Admitting: Physician Assistant

## 2016-09-17 NOTE — Telephone Encounter (Signed)
Last OV 11-30 Last refill 12-4 Ok to refill?

## 2016-09-17 NOTE — Telephone Encounter (Signed)
Rx called in 

## 2016-09-17 NOTE — Telephone Encounter (Signed)
Approved. #180+2. 

## 2016-10-24 ENCOUNTER — Other Ambulatory Visit: Payer: Self-pay | Admitting: Physician Assistant

## 2016-10-24 NOTE — Telephone Encounter (Signed)
rx filled per protocol  

## 2016-10-25 ENCOUNTER — Other Ambulatory Visit: Payer: Self-pay

## 2016-10-25 MED ORDER — RANITIDINE HCL 300 MG PO TABS: 300.0000 mg | ORAL_TABLET | Freq: Every day | ORAL | 1 refills | Status: DC

## 2016-10-25 NOTE — Telephone Encounter (Signed)
Refill appropriate 

## 2016-10-29 ENCOUNTER — Other Ambulatory Visit: Payer: Self-pay

## 2016-10-29 NOTE — Telephone Encounter (Signed)
Rx  was filled on 2-23

## 2016-11-25 ENCOUNTER — Encounter: Payer: Self-pay | Admitting: Family Medicine

## 2016-11-25 ENCOUNTER — Ambulatory Visit (INDEPENDENT_AMBULATORY_CARE_PROVIDER_SITE_OTHER): Payer: Medicare Other | Admitting: Family Medicine

## 2016-11-25 VITALS — BP 140/80 | HR 80 | Temp 97.9°F | Resp 16 | Ht 64.0 in | Wt 149.0 lb

## 2016-11-25 DIAGNOSIS — M25561 Pain in right knee: Secondary | ICD-10-CM | POA: Diagnosis not present

## 2016-11-25 NOTE — Progress Notes (Signed)
Subjective:    Patient ID: Erin Wong, female    DOB: March 28, 1951, 66 y.o.   MRN: [id number]  HPI  07/2016 Patient presents today with right knee pain. She has a previous history of a fracture in her lower leg status post ORIF of the tibia and fibula. Afterwards she has had chronic right knee pain that waxes and wanes. She is taking naproxen with very little relief. She is requesting a cortisone injection in her right knee. She is receiving this from time to time and has received substantial pain relief from these. Pain is primarily located anteriorly in the medial and lateral compartments. There is a slight effusion on exam today. There is no laxity to varus or valgus stress.  At that time, the patient underwent a cortisone injection  11/25/16 Previous cortisone injection worked well. The patient received 3 months of pain relief. Gradually over the last week, the pain has returned. She is taking tramadol and naproxen on a daily basis without relief. She has a moderate effusion in the right knee. She has tenderness to palpation over the medial and lateral joint lines. She reports pain with range of motion, prolonged standing, and walking. She is requesting a repeat cortisone injection  Past Medical History:  Diagnosis Date  . Allergy   . Arthritis   . Asthma    Past Surgical History:  Procedure Laterality Date  . ABDOMINAL HYSTERECTOMY  10/04/1995   Hysterectomy and Bilateral oophorectomy  . COLON SURGERY  09/2009   hole in colon  . FRACTURE SURGERY Right 08/02/2011   Right Leg--Plates & Screws  . 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 albuterol (PROVENTIL HFA;VENTOLIN HFA) 108 (90 BASE) MCG/ACT inhaler Inhale 1 puff into the lungs every 6 (six) hours as needed for wheezing or shortness of breath.    .  benazepril (LOTENSIN) 20 MG tablet Take 1 tablet (20 mg total) by mouth daily. 90 tablet 1  . Calcium 500 MG tablet Take 1 tablet (500 mg total) by mouth 3 (three) times daily. 90 tablet   . cholecalciferol (VITAMIN D) 1000 units tablet Take 1 tablet (1,000 Units total) by mouth daily. 90 tablet 1  . fluticasone (FLONASE) 50 MCG/ACT nasal spray Place 2 sprays into both nostrils daily. 16 g 11  . Fluticasone-Salmeterol (ADVAIR) 100-50 MCG/DOSE AEPB Inhale 1 puff into the lungs 2 (two) times daily. 180 each 1  . ibandronate (BONIVA) 150 MG tablet Take (1) tab in AM once a month with a full glass of water, on empty stomach. Do not take anything else by mouth or lie down for 30 min. 3 tablet 3  . naproxen (NAPROSYN) 500 MG tablet TAKE 1 TABLET BY MOUTH 2 TIMES DAILY WITH MEALS 240 tablet 1  . polyethylene glycol powder (GLYCOLAX/MIRALAX) powder Take 255 g by mouth 1 day or 1 dose. 255 g 5  . ranitidine (ZANTAC) 300 MG tablet Take 1 tablet (300 mg total) by mouth at bedtime. 90 tablet 1  . traMADol (ULTRAM) 50 MG tablet TAKE TWO TABLETS BY MOUTH THREE TIMES DAILY 180 tablet 2  . oxyCODONE-acetaminophen (ROXICET) 5-325 MG tablet Take 1 tablet by mouth every 6 (six) hours as needed for severe pain. (Patient not taking: Reported on 08/01/2016) 30 tablet 0   No current facility-administered medications on  file prior to visit.    No Known Allergies Social History   Social History  . Marital status: Married    Spouse name: N/A  . Number of children: N/A  . Years of education: N/A   Occupational History  . Not on file.   Social History Main Topics  . Smoking status: Former Smoker    Quit date: 01/10/1991  . Smokeless tobacco: Never Used  . Alcohol use No  . Drug use: No  . Sexual activity: No   Other Topics Concern  . Not on file   Social History Narrative  . No narrative on file     Review of Systems  All other systems reviewed and are negative.      Objective:   Physical Exam    Constitutional: She appears well-developed and well-nourished.  Cardiovascular: Normal rate, regular rhythm and normal heart sounds.  Exam reveals no gallop and no friction rub.   No murmur heard. Pulmonary/Chest: Effort normal and breath sounds normal. No respiratory distress. She has no wheezes. She has no rales.  Abdominal: Soft. Bowel sounds are normal. She exhibits no distension and no mass. There is no tenderness. There is no rebound and no guarding.  Musculoskeletal:       Right knee: She exhibits decreased range of motion and effusion. Tenderness found. Medial joint line and lateral joint line tenderness noted.  Vitals reviewed.         Assessment & Plan:   Acute pain of right knee  I believe the patient has right knee pain secondary to osteoarthritis. We discussed the risk of septic arthritis secondary to a cortisone shot and the patient wishes to proceed. Using sterile technique, I injected the right knee using a medial approach with 2 mL of lidocaine, 2 mL of Marcaine, and 2 mL of 40 mg per mL Kenalog. The patient tolerated the procedure well without complication

## 2017-01-10 ENCOUNTER — Other Ambulatory Visit: Payer: Self-pay | Admitting: Physician Assistant

## 2017-01-10 NOTE — Telephone Encounter (Signed)
Last OV 3/26 Last refill 1/16 Ok to refill?

## 2017-01-13 ENCOUNTER — Observation Stay (HOSPITAL_COMMUNITY)
Admission: EM | Admit: 2017-01-13 | Discharge: 2017-01-14 | Disposition: A | Discharge status: Home / Self Care | Payer: Medicare Other | Attending: Nephrology | Admitting: Nephrology

## 2017-01-13 ENCOUNTER — Telehealth: Payer: Self-pay

## 2017-01-13 ENCOUNTER — Emergency Department (HOSPITAL_COMMUNITY): Payer: Medicare Other

## 2017-01-13 ENCOUNTER — Encounter (HOSPITAL_COMMUNITY): Payer: Self-pay | Admitting: Emergency Medicine

## 2017-01-13 DIAGNOSIS — N189 Chronic kidney disease, unspecified: Secondary | ICD-10-CM | POA: Diagnosis not present

## 2017-01-13 DIAGNOSIS — I10 Essential (primary) hypertension: Secondary | ICD-10-CM | POA: Diagnosis not present

## 2017-01-13 DIAGNOSIS — J45909 Unspecified asthma, uncomplicated: Secondary | ICD-10-CM | POA: Diagnosis not present

## 2017-01-13 DIAGNOSIS — R0789 Other chest pain: Secondary | ICD-10-CM | POA: Diagnosis not present

## 2017-01-13 DIAGNOSIS — Z87891 Personal history of nicotine dependence: Secondary | ICD-10-CM | POA: Diagnosis not present

## 2017-01-13 DIAGNOSIS — Z79899 Other long term (current) drug therapy: Secondary | ICD-10-CM | POA: Diagnosis not present

## 2017-01-13 DIAGNOSIS — R079 Chest pain, unspecified: Secondary | ICD-10-CM

## 2017-01-13 DIAGNOSIS — R0602 Shortness of breath: Secondary | ICD-10-CM | POA: Diagnosis not present

## 2017-01-13 DIAGNOSIS — N179 Acute kidney failure, unspecified: Secondary | ICD-10-CM | POA: Diagnosis not present

## 2017-01-13 LAB — I-STAT TROPONIN, ED: Troponin i, poc: 0.02 ng/mL (ref 0.00–0.08)

## 2017-01-13 LAB — BASIC METABOLIC PANEL
ANION GAP: 9 (ref 5–15)
BUN: 32 mg/dL — ABNORMAL HIGH (ref 6–20)
CO2: 18 mmol/L — AB (ref 22–32)
Calcium: 9.5 mg/dL (ref 8.9–10.3)
Chloride: 106 mmol/L (ref 101–111)
Creatinine, Ser: 1.51 mg/dL — ABNORMAL HIGH (ref 0.44–1.00)
GFR calc Af Amer: 40 mL/min — ABNORMAL LOW (ref >60)
GFR, EST NON AFRICAN AMERICAN: 35 mL/min — AB (ref >60)
GLUCOSE: 123 mg/dL — AB (ref 65–99)
POTASSIUM: 4.5 mmol/L (ref 3.5–5.1)
Sodium: 133 mmol/L — ABNORMAL LOW (ref 135–145)

## 2017-01-13 LAB — CBC
HEMATOCRIT: 38.1 % (ref 36.0–46.0)
HEMOGLOBIN: 12.4 g/dL (ref 12.0–15.0)
MCH: 33.4 pg (ref 26.0–34.0)
MCHC: 32.5 g/dL (ref 30.0–36.0)
MCV: 102.7 fL — ABNORMAL HIGH (ref 78.0–100.0)
Platelets: 426 10*3/uL — ABNORMAL HIGH (ref 150–400)
RBC: 3.71 MIL/uL — AB (ref 3.87–5.11)
RDW: 14.1 % (ref 11.5–15.5)
WBC: 8.5 10*3/uL (ref 4.0–10.5)

## 2017-01-13 LAB — URINALYSIS, ROUTINE W REFLEX MICROSCOPIC
Bilirubin Urine: NEGATIVE
GLUCOSE, UA: NEGATIVE mg/dL
Hgb urine dipstick: NEGATIVE
Ketones, ur: 5 mg/dL — AB
LEUKOCYTES UA: NEGATIVE
NITRITE: NEGATIVE
PH: 5 (ref 5.0–8.0)
Protein, ur: NEGATIVE mg/dL
Specific Gravity, Urine: 1.012 (ref 1.005–1.030)

## 2017-01-13 LAB — TROPONIN I

## 2017-01-13 LAB — D-DIMER, QUANTITATIVE: D-Dimer, Quant: <0.27 ug/mL-FEU (ref 0.00–0.50)

## 2017-01-13 MED ORDER — GI COCKTAIL ~~LOC~~: 30.0000 mL | Freq: Four times a day (QID) | ORAL | Status: DC | PRN

## 2017-01-13 MED ORDER — METOPROLOL TARTRATE 5 MG/5ML IV SOLN: 5.0000 mg | INTRAVENOUS | Status: DC | PRN

## 2017-01-13 MED ORDER — FAMOTIDINE 20 MG PO TABS
10.0000 mg | ORAL_TABLET | Freq: Two times a day (BID) | ORAL | Status: DC
  Administered 2017-01-13 – 2017-01-14 (×2): 10 mg via ORAL
  Filled 2017-01-13 (×2): qty 1

## 2017-01-13 MED ORDER — NITROGLYCERIN 2 % TD OINT
1.0000 [in_us] | TOPICAL_OINTMENT | Freq: Four times a day (QID) | TRANSDERMAL | Status: DC
  Administered 2017-01-13 – 2017-01-14 (×4): 1 [in_us] via TOPICAL
  Filled 2017-01-13: qty 1
  Filled 2017-01-13: qty 30

## 2017-01-13 MED ORDER — ALBUTEROL SULFATE (2.5 MG/3ML) 0.083% IN NEBU: 3.0000 mL | INHALATION_SOLUTION | Freq: Four times a day (QID) | RESPIRATORY_TRACT | Status: DC | PRN

## 2017-01-13 MED ORDER — TRAMADOL HCL 50 MG PO TABS
100.0000 mg | ORAL_TABLET | Freq: Four times a day (QID) | ORAL | Status: DC | PRN
  Administered 2017-01-13: 100 mg via ORAL
  Filled 2017-01-13: qty 2

## 2017-01-13 MED ORDER — MOMETASONE FURO-FORMOTEROL FUM 100-5 MCG/ACT IN AERO
2.0000 | INHALATION_SPRAY | Freq: Two times a day (BID) | RESPIRATORY_TRACT | Status: DC
  Administered 2017-01-13: 2 via RESPIRATORY_TRACT
  Filled 2017-01-13: qty 8.8

## 2017-01-13 MED ORDER — VITAMIN D 1000 UNITS PO TABS
1000.0000 [IU] | ORAL_TABLET | Freq: Every day | ORAL | Status: DC
  Administered 2017-01-14: 1000 [IU] via ORAL
  Filled 2017-01-13: qty 1

## 2017-01-13 MED ORDER — ONDANSETRON HCL 4 MG/2ML IJ SOLN: 4.0000 mg | Freq: Four times a day (QID) | INTRAMUSCULAR | Status: DC | PRN

## 2017-01-13 MED ORDER — ASPIRIN 81 MG PO CHEW
324.0000 mg | CHEWABLE_TABLET | Freq: Once | ORAL | Status: AC
  Administered 2017-01-13: 324 mg via ORAL
  Filled 2017-01-13: qty 4

## 2017-01-13 MED ORDER — SODIUM CHLORIDE 0.9 % IV SOLN
INTRAVENOUS | Status: DC
Start: 2017-01-13 — End: 2017-01-14
  Administered 2017-01-13: 20:00:00 via INTRAVENOUS

## 2017-01-13 MED ORDER — ENOXAPARIN SODIUM 30 MG/0.3ML ~~LOC~~ SOLN
30.0000 mg | SUBCUTANEOUS | Status: DC
  Administered 2017-01-13: 30 mg via SUBCUTANEOUS
  Filled 2017-01-13: qty 0.3

## 2017-01-13 MED ORDER — ACETAMINOPHEN 325 MG PO TABS
650.0000 mg | ORAL_TABLET | ORAL | Status: DC | PRN
  Administered 2017-01-14: 650 mg via ORAL
  Filled 2017-01-13: qty 2

## 2017-01-13 MED ORDER — FLUTICASONE PROPIONATE 50 MCG/ACT NA SUSP: 2.0000 | Freq: Every day | NASAL | Status: DC | PRN

## 2017-01-13 NOTE — Telephone Encounter (Signed)
Pt called about tramadol refill info sent to [facility] waiting on response

## 2017-01-13 NOTE — H&P (Addendum)
History and Physical    Erin Wong BMW:[id number] DOB: 04/28/1951 DOA: 01/13/2017  PCP: Orlena Sheldon, PA-C  Patient coming from: Home  Chief Complaint: Chest pain  HPI: Erin Wong is a 66 y.o. female with medical history significant of hypertension, ex-smoker quit about 20 years ago, asthma, history of colon cancer status post surgery presented with left-sided chest pain going on for last 4-5 days. Patient reported heavy pressure-like chest pain on the left side associated with nausea and shortness of breath. The pain is exertional and improves with rest. Denied diaphoresis, radiation. Travel to about 4-5 hours last week by car. No sick contact. Currently she is comfortable when lying flat. Denied fever, chills, nausea, vomiting, abdominal pain, diarrhea, constipation, dysuria, urgency. She was trying to get in to see PCP today however unable to get up appointment therefore decided to come to emergency. Patient reported she was using albuterol inhaler most often recently.  ED Course: In the ER, troponin, EKG unremarkable. Mild elevation in serum creatinine noted. Admitted for chest pain evaluation  Review of Systems: As per HPI otherwise 10 point review of systems negative.    Past Medical History:  Diagnosis Date  . Allergy   . Arthritis   . Asthma     Past Surgical History:  Procedure Laterality Date  . ABDOMINAL HYSTERECTOMY  10/04/1995   Hysterectomy and Bilateral oophorectomy  . COLON SURGERY  09/2009   hole in colon  . FRACTURE SURGERY Right 08/02/2011   Right Leg--Plates & Screws  . 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   at time of colon surgery    Social history: reports that she quit smoking about 26 years ago. She has never used smokeless tobacco. She reports that she does not drink alcohol or use drugs.  No Known Allergies allergies reviewed with the patient  Family History  Problem Relation  Age of Onset  . Arthritis Mother   . Cancer Mother        Lung Cancer--Had quit smoking for 25 years  . Miscarriages / [place] Mother   . Alcohol abuse Father   . Alcohol abuse Brother   . COPD Brother   . Asthma Maternal Aunt   . Diabetes Maternal Aunt      Prior to Admission medications   Medication Sig Start Date End Date Taking? Authorizing Provider  acetaminophen (TYLENOL) 650 MG CR tablet Take 1,300 mg by mouth 2 (two) times daily as needed for pain.   Yes [provider]  albuterol (PROVENTIL HFA;VENTOLIN HFA) 108 (90 BASE) MCG/ACT inhaler Inhale 1 puff into the lungs every 6 (six) hours as needed for wheezing or shortness of breath.   Yes [provider]  benazepril (LOTENSIN) 20 MG tablet Take 1 tablet (20 mg total) by mouth daily. 08/05/16  Yes Orlena Sheldon, PA-C  Calcium 500 MG tablet Take 1 tablet (500 mg total) by mouth 3 (three) times daily. Patient taking differently: Take 500 mg by mouth daily.  08/05/16  Yes Dixon, Mary B, PA-C  Carbamide Peroxide (EAR WAX DROPS OT) Place 2 drops into the right ear daily as needed (ear wax removal).   Yes [provider]  cholecalciferol (VITAMIN D) 1000 units tablet Take 1 tablet (1,000 Units total) by mouth daily. 08/05/16  Yes Dena Billet B, PA-C  fluticasone (FLONASE) 50 MCG/ACT nasal spray Place 2 sprays into both nostrils daily. Patient taking differently: Place 2 sprays into  both nostrils daily as needed for allergies or rhinitis.  08/05/16  Yes Orlena Sheldon, PA-C  Fluticasone-Salmeterol (ADVAIR) 100-50 MCG/DOSE AEPB Inhale 1 puff into the lungs 2 (two) times daily. Patient taking differently: Inhale 1 puff into the lungs See admin instructions. Inhale 1 puff twice daily, may also inhale 1 puff during the night as needed for shortness of breath 08/05/16  Yes Dixon, Stanton Kidney B, PA-C  naproxen (NAPROSYN) 500 MG tablet TAKE 1 TABLET BY MOUTH 2 TIMES DAILY WITH MEALS 10/24/16  Yes Dena Billet B, PA-C  polyethylene  glycol powder (GLYCOLAX/MIRALAX) powder Take 255 g by mouth 1 day or 1 dose. Patient taking differently: Take 17 g by mouth daily.  08/05/16  Yes Orlena Sheldon, PA-C  ranitidine (ZANTAC) 300 MG tablet Take 1 tablet (300 mg total) by mouth at bedtime. Patient taking differently: Take 300 mg by mouth daily.  10/25/16  Yes Orlena Sheldon, PA-C  traMADol (ULTRAM) 50 MG tablet TAKE 2 TABLETS 3 TIMES A DAY 01/13/17  Yes Dena Billet B, PA-C  ibandronate (BONIVA) 150 MG tablet Take (1) tab in AM once a month with a full glass of water, on empty stomach. Do not take anything else by mouth or lie down for 30 min. Patient not taking: Reported on 01/13/2017 08/02/16   Orlena Sheldon, PA-C  oxyCODONE-acetaminophen (ROXICET) 5-325 MG tablet Take 1 tablet by mouth every 6 (six) hours as needed for severe pain. Patient not taking: Reported on 08/01/2016 02/12/16   Alycia Rossetti, MD    Physical Exam: Vitals:   01/13/17 1645 01/13/17 1700 01/13/17 1715 01/13/17 1730  BP: (!) 148/80 (!) 152/76 (!) 156/74 (!) 158/81  Pulse: 71 75 71 73  Resp: 17 15 14 12   Temp:      TempSrc:      SpO2: 100% 100% 100% 100%      Constitutional: NAD, calm, comfortable Vitals:   01/13/17 1645 01/13/17 1700 01/13/17 1715 01/13/17 1730  BP: (!) 148/80 (!) 152/76 (!) 156/74 (!) 158/81  Pulse: 71 75 71 73  Resp: 17 15 14 12   Temp:      TempSrc:      SpO2: 100% 100% 100% 100%   Eyes: PERRL, lids and conjunctivae normal ENMT: Mucous membranes are moist.  Neck: normal, supple Respiratory: clear to auscultation bilaterally, no wheezing, no crackles. Normal respiratory effort. No accessory muscle use.  Cardiovascular: Regular rate and rhythm, no murmurs / rubs / gallops. No extremity edema. Abdomen: no tenderness, . Bowel sounds positive. Soft Musculoskeletal: no clubbing / cyanosis. No joint deformity upper and lower extremities.  Skin: no rashes, lesions, ulcers. No induration Neurologic:. Strength 5/5 in all 4.    Psychiatric: Normal judgment and insight. Alert and oriented x 3. Normal mood.    Labs on Admission: I have personally reviewed following labs and imaging studies  CBC:  Recent Labs Lab 01/13/17 1355  WBC 8.5  HGB 12.4  HCT 38.1  MCV 102.7*  PLT 833*   Basic Metabolic Panel:  Recent Labs Lab 01/13/17 1355  NA 133*  K 4.5  CL 106  CO2 18*  GLUCOSE 123*  BUN 32*  CREATININE 1.51*  CALCIUM 9.5   GFR: CrCl cannot be calculated (Unknown ideal weight.). Liver Function Tests: No results for input(s): AST, ALT, ALKPHOS, BILITOT, PROT, ALBUMIN in the last 168 hours. No results for input(s): LIPASE, AMYLASE in the last 168 hours. No results for input(s): AMMONIA in the last 168 hours. Coagulation Profile:  No results for input(s): INR, PROTIME in the last 168 hours. Cardiac Enzymes: No results for input(s): CKTOTAL, CKMB, CKMBINDEX, TROPONINI in the last 168 hours. BNP (last 3 results) No results for input(s): PROBNP in the last 8760 hours. HbA1C: No results for input(s): HGBA1C in the last 72 hours. CBG: No results for input(s): GLUCAP in the last 168 hours. Lipid Profile: No results for input(s): CHOL, HDL, LDLCALC, TRIG, CHOLHDL, LDLDIRECT in the last 72 hours. Thyroid Function Tests: No results for input(s): TSH, T4TOTAL, FREET4, T3FREE, THYROIDAB in the last 72 hours. Anemia Panel: No results for input(s): VITAMINB12, FOLATE, FERRITIN, TIBC, IRON, RETICCTPCT in the last 72 hours. Urine analysis:    Component Value Date/Time   COLORURINE YELLOW 12/11/2015 [place] 12/11/2015 1452   LABSPEC >1.030 12/11/2015 1452   PHURINE 6.0 12/11/2015 1452   GLUCOSEU NEGATIVE 12/11/2015 1452   HGBUR NEGATIVE 12/11/2015 1452   BILIRUBINUR NEGATIVE 12/11/2015 1452   KETONESUR NEGATIVE 12/11/2015 1452   PROTEINUR NEGATIVE 12/11/2015 1452   NITRITE NEGATIVE 12/11/2015 1452   LEUKOCYTESUR TRACE (A) 12/11/2015 1452   Sepsis Labs:  !!!!!!!!!!!!!!!!!!!!!!!!!!!!!!!!!!!!!!!!!!!! @LABRCNTIP (procalcitonin:4,lacticidven:4) )No results found for this or any previous visit (from the past 240 hour(s)).   Radiological Exams on Admission: Dg Chest 2 View  Result Date: 01/13/2017 CLINICAL DATA:  sob, chest heaviness EXAM: CHEST  2 VIEW COMPARISON:  04/04/1959 1,016 FINDINGS: stable mild hyperinflation and left basilar pleural scarring/ thickening blunting the left costophrenic angle. apical scarring also noted. no superimposed pneumonia, collapse or consolidation. negative for edema or pneumothorax. normal heart size and vascularity. trachea is midline. aorta is atherosclerotic. IMPRESSION: Stable chronic chest findings and scarring as above. No superimposed acute process or significant interval change. Electronically Signed   By: Jerilynn Mages.  Shick M.D.   On: 01/13/2017 14:11    EKG: Independently reviewed.Sinus rhythm, no ischemic changes   Assessment/Plan Active Problems:   Chest pain  # Left-sided chest pain: Heavy pressure-like chest pain which is exertional and associated with shortness of breath and nausea. Patient with history of hypertension. HEART score of at least 4. -Admitted in telemetry for chest pain, serial troponin, repeat EKG, -Stress test order for morning, keep nothing by mouth from midnight, IV fluid -Received aspirin in the ER, awaiting further evaluation and test results before deciding cardiac medications -Follow-up d-dimer result. If positive consider VQ scan because of renal failure. Less likely to be thromboembolic disease therefore holding off on systemic anticoagulation. Patient has no calf tenderness or leg swelling. Patient is not hypoxic or tachycardic. Currently patient is chest pain-free.  # Acute on chronic kidney disease is stage III likely hemodynamically mediated in the setting of naproxen/Motrin use in the setting of mild dehydration and benazepril use. -Check UA, started gentle IV hydration.  Discontinue naproxen or any NSAIDs. Holding benazepril. Repeat lab in the morning.  #Mild hyponatremia: Repeat lab  #Hypertension: Holding benazepril. Order IV metoprolol as needed. Continue to monitor blood pressure.  # asthma,mild intermittent: Patient is not hypoxic and has no wheezing. Respiratory status is stable. Chest x-ray with no acute finding. Continue albuterol and DULERA  #History of colon cancer status post surgery. Clinically in remission at per patient. Advised outpatient follow-up   DVT prophylaxis: Lovenox Code Status: Full code Family Communication: Discussed with the patient has been at bedside Disposition Plan: Observation and likely home tomorrow Consults called: None Admission status: Observation   Berlynn Warsame Tanna Furry MD [facility] Pager [phone]  If 7PM-7AM, please contact night-coverage [email or web address]  Password [email or web address]  01/13/2017, 6:03 PM

## 2017-01-13 NOTE — ED Triage Notes (Signed)
Pt reports hx of asthma, states she has felt sob and like something was sitting on her chest for the past 2 days, reports using her inhaler with no relief. Pt a/ox4, resp e/u, nad.

## 2017-01-13 NOTE — ED Notes (Addendum)
Spoke to patient placement. Patient meets telemetry criteria but having active chest pressure. Patient placement aware.  NOTE WRITTEN BY ALECIA BABB

## 2017-01-13 NOTE — ED Provider Notes (Signed)
[facility] DEPT Provider Note   CSN: [id number] Arrival date & time: 01/13/17  1326     History   Chief Complaint Chief Complaint  Patient presents with  . Asthma  . Chest Pain    HPI Erin Wong is a 66 y.o. female.  HPI Pt started having pressure in her chest last Thursday.    She is having intermittent episodes that are occurring with exertion and would get better with resting.  They are occurring when she is doing chores .  The episodes can last as long as she is active.  No fevers, some cough and sneezing which is new.  She does feel short of breath whenever she has episodes. No leg swelling.  No history of heart problems.  SHe does have asthma.  SHe has been using her inhalers more when she gets these episodes.  The inhaler does seem to help the chest pressure and her breathing although she has not noticed that she's wheezing..  No arm or back pain.  SHe does get diaphoretic when this happens.  The patient was at a town and recently got back in town and that's why she decided to come to the emergency room to be evaluated. She denies any history of heart disease. No history of PE or DVT. She does have a prior history of lung cancer.  She is not currently a smoker Past Medical History:  Diagnosis Date  . Allergy   . Arthritis   . Asthma     Patient Active Problem List   Diagnosis Date Noted  . Osteoporosis 10/18/2015  . Vitamin D deficiency 07/20/2015  . HTN (hypertension) 07/17/2015  . GERD (gastroesophageal reflux disease) 01/12/2015  . Chronic constipation 01/12/2015  . History of colon surgery 01/12/2015  . History of total hysterectomy with bilateral salpingo-oophorectomy (BSO) 01/12/2015  . Allergy   . Asthma     Past Surgical History:  Procedure Laterality Date  . ABDOMINAL HYSTERECTOMY  10/04/1995   Hysterectomy and Bilateral oophorectomy  . COLON SURGERY  09/2009   hole in colon  . FRACTURE SURGERY Right 08/02/2011   Right Leg--Plates & Screws  . 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   at time of colon surgery    OB History    No data available       Home Medications    Prior to Admission medications   Medication Sig Start Date End Date Taking? Authorizing Provider  albuterol (PROVENTIL HFA;VENTOLIN HFA) 108 (90 BASE) MCG/ACT inhaler Inhale 1 puff into the lungs every 6 (six) hours as needed for wheezing or shortness of breath.    [provider]  benazepril (LOTENSIN) 20 MG tablet Take 1 tablet (20 mg total) by mouth daily. 08/05/16   Orlena Sheldon, PA-C  Calcium 500 MG tablet Take 1 tablet (500 mg total) by mouth 3 (three) times daily. 08/05/16   Orlena Sheldon, PA-C  cholecalciferol (VITAMIN D) 1000 units tablet Take 1 tablet (1,000 Units total) by mouth daily. 08/05/16   Dena Billet B, PA-C  fluticasone (FLONASE) 50 MCG/ACT nasal spray Place 2 sprays into both nostrils daily. 08/05/16   Orlena Sheldon, PA-C  Fluticasone-Salmeterol (ADVAIR) 100-50 MCG/DOSE AEPB Inhale 1 puff into the lungs 2 (two) times daily. 08/05/16   Orlena Sheldon, PA-C  ibandronate (BONIVA) 150 MG tablet Take (1) tab in AM once a month with a full glass of water, on empty stomach.  Do not take anything else by mouth or lie down for 30 min. 08/02/16   Dena Billet B, PA-C  naproxen (NAPROSYN) 500 MG tablet TAKE 1 TABLET BY MOUTH 2 TIMES DAILY WITH MEALS 10/24/16   Orlena Sheldon, PA-C  oxyCODONE-acetaminophen (ROXICET) 5-325 MG tablet Take 1 tablet by mouth every 6 (six) hours as needed for severe pain. Patient not taking: Reported on 08/01/2016 02/12/16   Alycia Rossetti, MD  polyethylene glycol powder [facility]) powder Take 255 g by mouth 1 day or 1 dose. 08/05/16   Orlena Sheldon, PA-C  ranitidine (ZANTAC) 300 MG tablet Take 1 tablet (300 mg total) by mouth at bedtime. 10/25/16   Orlena Sheldon, PA-C  traMADol (ULTRAM) 50 MG tablet TAKE 2 TABLETS 3 TIMES A DAY 01/13/17   Orlena Sheldon, PA-C     Family History Family History  Problem Relation Age of Onset  . Arthritis Mother   . Cancer Mother        Lung Cancer--Had quit smoking for 25 years  . Miscarriages / [place] Mother   . Alcohol abuse Father   . Alcohol abuse Brother   . COPD Brother   . Asthma Maternal Aunt   . Diabetes Maternal Aunt     Social History Social History  Substance Use Topics  . Smoking status: Former Smoker    Quit date: 01/10/1991  . Smokeless tobacco: Never Used  . Alcohol use No     Allergies   Patient has no known allergies.   Review of Systems Review of Systems  All other systems reviewed and are negative.    Physical Exam Updated Vital Signs BP (!) 150/78   Pulse 77   Temp 97.8 F (36.6 C) (Oral)   Resp 19   SpO2 100%   Physical Exam  Constitutional: She appears well-developed and well-nourished. No distress.  HENT:  Head: Normocephalic and atraumatic.  Right Ear: External ear normal.  Left Ear: External ear normal.  Eyes: Conjunctivae are normal. Right eye exhibits no discharge. Left eye exhibits no discharge. No scleral icterus.  Neck: Neck supple. No tracheal deviation present.  Cardiovascular: Normal rate, regular rhythm and intact distal pulses.   Pulmonary/Chest: Effort normal and breath sounds normal. No stridor. No respiratory distress. She has no wheezes. She has no rales.  Abdominal: Soft. Bowel sounds are normal. She exhibits no distension. There is no tenderness. There is no rebound and no guarding.  Musculoskeletal: She exhibits no edema or tenderness.  Neurological: She is alert. She has normal strength. No cranial nerve deficit (no facial droop, extraocular movements intact, no slurred speech) or sensory deficit. She exhibits normal muscle tone. She displays no seizure activity. Coordination normal.  Skin: Skin is warm and dry. No rash noted.  Psychiatric: She has a normal mood and affect.  Nursing note and vitals reviewed.    ED Treatments /  Results  Labs (all labs ordered are listed, but only abnormal results are displayed) Labs Reviewed  BASIC METABOLIC PANEL - Abnormal; Notable for the following:       Result Value   Sodium 133 (*)    CO2 18 (*)    Glucose, Bld 123 (*)    BUN 32 (*)    Creatinine, Ser 1.51 (*)    GFR calc non Af Amer 35 (*)    GFR calc Af Amer 40 (*)    All other components within normal limits  CBC - Abnormal; Notable for the following:  RBC 3.71 (*)    MCV 102.7 (*)    Platelets 426 (*)    All other components within normal limits  D-DIMER, QUANTITATIVE (NOT AT [facility])  I-STAT TROPOININ, ED    EKG  EKG Interpretation  Date/Time:  Monday Jan 13 2017 13:35:13 EDT Ventricular Rate:  90 PR Interval:  138 QRS Duration: 84 QT Interval:  328 QTC Calculation: 401 R Axis:   60 Text Interpretation:  Sinus rhythm with Premature atrial complexes Otherwise normal ECG Since last tracing rate faster Confirmed by Mansel Strother  MD-J, Shadavia Dampier ([id number]) on 01/13/2017 4:57:07 PM       Radiology Dg Chest 2 View  Result Date: 01/13/2017 CLINICAL DATA:  sob, chest heaviness EXAM: CHEST  2 VIEW COMPARISON:  04/04/1959 1,016 FINDINGS: stable mild hyperinflation and left basilar pleural scarring/ thickening blunting the left costophrenic angle. apical scarring also noted. no superimposed pneumonia, collapse or consolidation. negative for edema or pneumothorax. normal heart size and vascularity. trachea is midline. aorta is atherosclerotic. IMPRESSION: Stable chronic chest findings and scarring as above. No superimposed acute process or significant interval change. Electronically Signed   By: Jerilynn Mages.  Shick M.D.   On: 01/13/2017 14:11    Procedures Procedures (including critical care time)  Medications Ordered in ED Medications  aspirin chewable tablet 324 mg (not administered)  nitroGLYCERIN (NITROGLYN) 2 % ointment 1 inch (not administered)     Initial Impression / Assessment and Plan / ED Course  I have reviewed the  triage vital signs and the nursing notes.  Pertinent labs & imaging results that were available during my care of the patient were reviewed by me and considered in my medical decision making (see chart for details).   Pt presents to the ED with chest pressure and shortness of breath. No acute findings noted on her initial ED workup.  No wheezing on exam.  No pna or chf on CXR.   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 She is at moderate risk for heart disease with a heart score of 6  for major acute cardiac events.  Considering her history and risk factors, I think she will benefit from observation, further cardiac risk stratification.  Considering her recent travel I will add on a d dimer.  Final Clinical Impressions(s) / ED Diagnoses   Final diagnoses:  Chest pressure      Dorie Rank, MD 01/13/17 1711

## 2017-01-13 NOTE — Telephone Encounter (Signed)
Rx called in 

## 2017-01-13 NOTE — Telephone Encounter (Signed)
Approved. #180+2. 

## 2017-01-14 ENCOUNTER — Observation Stay (HOSPITAL_BASED_OUTPATIENT_CLINIC_OR_DEPARTMENT_OTHER): Payer: Medicare Other

## 2017-01-14 DIAGNOSIS — N179 Acute kidney failure, unspecified: Secondary | ICD-10-CM | POA: Diagnosis not present

## 2017-01-14 DIAGNOSIS — Z87891 Personal history of nicotine dependence: Secondary | ICD-10-CM | POA: Diagnosis not present

## 2017-01-14 DIAGNOSIS — R079 Chest pain, unspecified: Secondary | ICD-10-CM

## 2017-01-14 DIAGNOSIS — J45909 Unspecified asthma, uncomplicated: Secondary | ICD-10-CM | POA: Diagnosis not present

## 2017-01-14 DIAGNOSIS — Z79899 Other long term (current) drug therapy: Secondary | ICD-10-CM | POA: Diagnosis not present

## 2017-01-14 DIAGNOSIS — I10 Essential (primary) hypertension: Secondary | ICD-10-CM | POA: Diagnosis not present

## 2017-01-14 DIAGNOSIS — R0789 Other chest pain: Secondary | ICD-10-CM | POA: Diagnosis not present

## 2017-01-14 LAB — LIPID PANEL
CHOL/HDL RATIO: 2.6 ratio
Cholesterol: 230 mg/dL — ABNORMAL HIGH (ref 0–200)
HDL: 88 mg/dL (ref >40)
LDL CALC: 105 mg/dL — AB (ref 0–99)
Triglycerides: 185 mg/dL — ABNORMAL HIGH (ref <150)
VLDL: 37 mg/dL (ref 0–40)

## 2017-01-14 LAB — BASIC METABOLIC PANEL
ANION GAP: 9 (ref 5–15)
BUN: 21 mg/dL — AB (ref 6–20)
CO2: 19 mmol/L — AB (ref 22–32)
Calcium: 8.8 mg/dL — ABNORMAL LOW (ref 8.9–10.3)
Chloride: 108 mmol/L (ref 101–111)
Creatinine, Ser: 0.97 mg/dL (ref 0.44–1.00)
GFR calc Af Amer: >60 mL/min (ref >60)
GFR calc non Af Amer: 60 mL/min — ABNORMAL LOW (ref >60)
GLUCOSE: 99 mg/dL (ref 65–99)
POTASSIUM: 4 mmol/L (ref 3.5–5.1)
Sodium: 136 mmol/L (ref 135–145)

## 2017-01-14 LAB — NM MYOCAR MULTI W/SPECT W/WALL MOTION / EF
CHL CUP RESTING HR STRESS: 67 {beats}/min
Estimated workload: 1 METS
Exercise duration (min): 0 min
Exercise duration (sec): 0 s
MPHR: 154 {beats}/min
Peak HR: 100 {beats}/min
Percent HR: 64 %

## 2017-01-14 MED ORDER — TECHNETIUM TC 99M TETROFOSMIN IV KIT
30.0000 | PACK | Freq: Once | INTRAVENOUS | Status: AC | PRN
  Administered 2017-01-14: 30 via INTRAVENOUS

## 2017-01-14 MED ORDER — TECHNETIUM TC 99M TETROFOSMIN IV KIT
10.0000 | PACK | Freq: Once | INTRAVENOUS | Status: AC | PRN
  Administered 2017-01-14: 10 via INTRAVENOUS

## 2017-01-14 MED ORDER — ENOXAPARIN SODIUM 40 MG/0.4ML ~~LOC~~ SOLN: 40.0000 mg | SUBCUTANEOUS | Status: DC

## 2017-01-14 MED ORDER — ASPIRIN EC 81 MG PO TBEC: 81.0000 mg | DELAYED_RELEASE_TABLET | Freq: Every day | ORAL | 11 refills | Status: AC

## 2017-01-14 MED ORDER — REGADENOSON 0.4 MG/5ML IV SOLN
0.4000 mg | Freq: Once | INTRAVENOUS | Status: AC
  Administered 2017-01-14: 0.4 mg via INTRAVENOUS
  Filled 2017-01-14: qty 5

## 2017-01-14 MED ORDER — ATORVASTATIN CALCIUM 10 MG PO TABS: 10.0000 mg | ORAL_TABLET | Freq: Every day | ORAL | 0 refills | Status: DC

## 2017-01-14 MED ORDER — REGADENOSON 0.4 MG/5ML IV SOLN
INTRAVENOUS | Status: AC
  Filled 2017-01-14: qty 5

## 2017-01-14 NOTE — Discharge Summary (Signed)
Physician Discharge Summary  Erin Wong XFG:[id number] DOB: Nov 16, 1950 DOA: 01/13/2017  PCP: Orlena Sheldon, PA-C  Admit date: 01/13/2017 Discharge date: 01/14/2017  Admitted From:home Disposition:home  Recommendations for Outpatient Follow-up:  1. Follow up with PCP in 1-2 weeks 2. Please obtain BMP/CBC in one week  Home Health:no Equipment/Devices:no Discharge Condition:stable CODE STATUS:full Diet recommendation:carb modified heart healthy  Brief/Interim Summary: 66 year old female with history of hypertension, ex-smoker quit about 20 years ago, asthma, history of colon cancer status post surgery presented with left-sided chest pain for about 4-5 days. Patient was admitted for chest pain rule out. EKG and troponin unremarkable. D-dimer not elevated. Chest x-ray with no acute finding. Patient was observed overnight. Cardiac distress at this was done. The stress test showed low anterior wall scar with no finding of ischemia. Left ventricular ejection fraction of 54% with a low noninvasive risk stratification. I discussed with the radiologist as well as cardiologist. Reviewed with the cardiologist Dr.Nahser.  Patient denied chest pain, shortness of breath, nausea or vomiting. Acute kidney injury improved. Patient was educated to avoid NSAIDs. Continue benazepril. Recommended to monitor labs with PCP. Contact elevated LDL. Started on a statin. Also started on aspirin on discharge.   Asthma stable. Hyponatremia improved.  Discussed with the patient and her husband regarding the test results and medications. Recommended to follow up with PCP. At this time patient is medically stable.  Discharge Diagnoses:  Active Problems:   Chest pain    Discharge Instructions  Discharge Instructions    Call MD for:  difficulty breathing, headache or visual disturbances    Complete by:  As directed    Call MD for:  extreme fatigue    Complete by:  As directed    Call MD for:  hives    Complete  by:  As directed    Call MD for:  persistant dizziness or light-headedness    Complete by:  As directed    Call MD for:  persistant nausea and vomiting    Complete by:  As directed    Call MD for:  severe uncontrolled pain    Complete by:  As directed    Call MD for:  temperature >100.4    Complete by:  As directed    Diet - low sodium heart healthy    Complete by:  As directed    Discharge instructions    Complete by:  As directed    Avoid NSAIDs.   Increase activity slowly    Complete by:  As directed      Allergies as of 01/14/2017   No Known Allergies     Medication List    STOP taking these medications   naproxen 500 MG tablet Commonly known as:  NAPROSYN   oxyCODONE-acetaminophen 5-325 MG tablet Commonly known as:  ROXICET     TAKE these medications   acetaminophen 650 MG CR tablet Commonly known as:  TYLENOL Take 1,300 mg by mouth 2 (two) times daily as needed for pain.   albuterol 108 (90 Base) MCG/ACT inhaler Commonly known as:  PROVENTIL HFA;VENTOLIN HFA Inhale 1 puff into the lungs every 6 (six) hours as needed for wheezing or shortness of breath.   aspirin EC 81 MG tablet Take 1 tablet (81 mg total) by mouth daily.   atorvastatin 10 MG tablet Commonly known as:  LIPITOR Take 1 tablet (10 mg total) by mouth daily at 6 PM.   benazepril 20 MG tablet Commonly known as:  LOTENSIN Take 1  tablet (20 mg total) by mouth daily.   Calcium 500 MG tablet Take 1 tablet (500 mg total) by mouth 3 (three) times daily. What changed:  when to take this   cholecalciferol 1000 units tablet Commonly known as:  VITAMIN D Take 1 tablet (1,000 Units total) by mouth daily.   EAR WAX DROPS OT Place 2 drops into the right ear daily as needed (ear wax removal).   fluticasone 50 MCG/ACT nasal spray Commonly known as:  FLONASE Place 2 sprays into both nostrils daily. What changed:  when to take this  reasons to take this   Fluticasone-Salmeterol 100-50 MCG/DOSE  Aepb Commonly known as:  ADVAIR Inhale 1 puff into the lungs 2 (two) times daily. What changed:  when to take this  additional instructions   ibandronate 150 MG tablet Commonly known as:  BONIVA Take (1) tab in AM once a month with a full glass of water, on empty stomach. Do not take anything else by mouth or lie down for 30 min.   polyethylene glycol powder powder Commonly known as:  GLYCOLAX/MIRALAX Take 255 g by mouth 1 day or 1 dose. What changed:  how much to take  when to take this   ranitidine 300 MG tablet Commonly known as:  ZANTAC Take 1 tablet (300 mg total) by mouth at bedtime. What changed:  when to take this   traMADol 50 MG tablet Commonly known as:  ULTRAM TAKE 2 TABLETS 3 TIMES A DAY      Follow-up Information    Orlena Sheldon, PA-C. Schedule an appointment as soon as possible for a visit in 1 week(s).   Specialty:  Physician Assistant Contact information: [address] [place] [address] [phone]          No Known Allergies  Consultations: Phone consult with radiology and cardiology  Procedures/Studies: Cardiac stress test  Subjective: Reported feeling good. Denied headache, dizziness, nausea, vomiting, chest pain, shortness of breath.  Discharge Exam: Vitals:   01/14/17 0947 01/14/17 1242  BP: (!) 174/79 (!) 143/70  Pulse: 91 74  Resp:    Temp:  98.5 F (36.9 C)   Vitals:   01/14/17 0943 01/14/17 0945 01/14/17 0947 01/14/17 1242  BP: (!) 142/70 (!) 173/84 (!) 174/79 (!) 143/70  Pulse: 98 94 91 74  Resp:      Temp:    98.5 F (36.9 C)  TempSrc:    Oral  SpO2:    100%  Weight:      Height:        General: Pt is alert, awake, not in acute distress Cardiovascular: RRR, S1/S2 +, no rubs, no gallops Respiratory: CTA bilaterally, no wheezing, no rhonchi Abdominal: Soft, NT, ND, bowel sounds + Extremities: no edema, no cyanosis    The results of significant diagnostics from this hospitalization (including  imaging, microbiology, ancillary and laboratory) are listed below for reference.     Microbiology: No results found for this or any previous visit (from the past 240 hour(s)).   Labs: BNP (last 3 results) No results for input(s): BNP in the last 8760 hours. Basic Metabolic Panel:  Recent Labs Lab 01/13/17 1355 01/14/17 0539  NA 133* 136  K 4.5 4.0  CL 106 108  CO2 18* 19*  GLUCOSE 123* 99  BUN 32* 21*  CREATININE 1.51* 0.97  CALCIUM 9.5 8.8*   Liver Function Tests: No results for input(s): AST, ALT, ALKPHOS, BILITOT, PROT, ALBUMIN in the last 168 hours.  No results for input(s): LIPASE, AMYLASE in the last 168 hours. No results for input(s): AMMONIA in the last 168 hours. CBC:  Recent Labs Lab 01/13/17 1355  WBC 8.5  HGB 12.4  HCT 38.1  MCV 102.7*  PLT 426*   Cardiac Enzymes:  Recent Labs Lab 01/13/17 2018 01/13/17 2309  TROPONINI <0.03 <0.03   BNP: Invalid input(s): POCBNP CBG: No results for input(s): GLUCAP in the last 168 hours. D-Dimer  Recent Labs  01/13/17 1708  DDIMER <0.27   Hgb A1c No results for input(s): HGBA1C in the last 72 hours. Lipid Profile  Recent Labs  01/14/17 0539  CHOL 230*  HDL 88  LDLCALC 105*  TRIG 185*  CHOLHDL 2.6   Thyroid function studies No results for input(s): TSH, T4TOTAL, T3FREE, THYROIDAB in the last 72 hours.  Invalid input(s): FREET3 Anemia work up No results for input(s): VITAMINB12, FOLATE, FERRITIN, TIBC, IRON, RETICCTPCT in the last 72 hours. Urinalysis    Component Value Date/Time   COLORURINE STRAW (A) 01/13/2017 2028   APPEARANCEUR CLEAR 01/13/2017 2028   LABSPEC 1.012 01/13/2017 2028   PHURINE 5.0 01/13/2017 2028   GLUCOSEU NEGATIVE 01/13/2017 2028   HGBUR NEGATIVE 01/13/2017 2028   BILIRUBINUR NEGATIVE 01/13/2017 2028   KETONESUR 5 (A) 01/13/2017 2028   PROTEINUR NEGATIVE 01/13/2017 2028   NITRITE NEGATIVE 01/13/2017 2028   LEUKOCYTESUR NEGATIVE 01/13/2017 2028   Sepsis  Labs Invalid input(s): PROCALCITONIN,  WBC,  LACTICIDVEN Microbiology No results found for this or any previous visit (from the past 240 hour(s)).   Time coordinating discharge: 35 minutes  SIGNED:   Rosita Fire, MD  [facility] 01/14/2017, 3:44 PM  If 7PM-7AM, please contact night-coverage [email or web address] Password [email or web address]

## 2017-01-14 NOTE — Progress Notes (Signed)
   Marlynn Perking Waldron presented for a lexiscan cardiolite today.  No immediate complications.  Stress imaging is pending at this time.  Murray Hodgkins, NP 01/14/2017, 9:46 AM

## 2017-02-12 ENCOUNTER — Encounter: Payer: Self-pay | Admitting: Physician Assistant

## 2017-02-12 ENCOUNTER — Ambulatory Visit (INDEPENDENT_AMBULATORY_CARE_PROVIDER_SITE_OTHER): Payer: Medicare Other | Admitting: Physician Assistant

## 2017-02-12 VITALS — BP 132/74 | HR 77 | Temp 98.0°F | Resp 16 | Wt 148.6 lb

## 2017-02-12 DIAGNOSIS — E785 Hyperlipidemia, unspecified: Secondary | ICD-10-CM | POA: Diagnosis not present

## 2017-02-12 DIAGNOSIS — M810 Age-related osteoporosis without current pathological fracture: Secondary | ICD-10-CM | POA: Diagnosis not present

## 2017-02-12 DIAGNOSIS — J4520 Mild intermittent asthma, uncomplicated: Secondary | ICD-10-CM

## 2017-02-12 DIAGNOSIS — E559 Vitamin D deficiency, unspecified: Secondary | ICD-10-CM

## 2017-02-12 DIAGNOSIS — I10 Essential (primary) hypertension: Secondary | ICD-10-CM

## 2017-02-12 LAB — COMPLETE METABOLIC PANEL WITH GFR
ALBUMIN: 3.9 g/dL (ref 3.6–5.1)
ALK PHOS: 95 U/L (ref 33–130)
ALT: 16 U/L (ref 6–29)
AST: 16 U/L (ref 10–35)
BUN: 16 mg/dL (ref 7–25)
CHLORIDE: 103 mmol/L (ref 98–110)
CO2: 23 mmol/L (ref 20–31)
Calcium: 9.5 mg/dL (ref 8.6–10.4)
Creat: 0.82 mg/dL (ref 0.50–0.99)
GFR, EST NON AFRICAN AMERICAN: 75 mL/min (ref >=60)
GFR, Est African American: 86 mL/min (ref >=60)
GLUCOSE: 106 mg/dL — AB (ref 70–99)
POTASSIUM: 4.7 mmol/L (ref 3.5–5.3)
SODIUM: 141 mmol/L (ref 135–146)
Total Bilirubin: 0.3 mg/dL (ref 0.2–1.2)
Total Protein: 6.9 g/dL (ref 6.1–8.1)

## 2017-02-12 LAB — LIPID PANEL
CHOL/HDL RATIO: 1.5 ratio (ref <5.0)
CHOLESTEROL: 230 mg/dL — AB (ref <200)
HDL: 157 mg/dL (ref >50)
LDL Cholesterol: 60 mg/dL (ref <100)
Triglycerides: 63 mg/dL (ref <150)
VLDL: 13 mg/dL (ref <30)

## 2017-02-12 MED ORDER — BENAZEPRIL HCL 20 MG PO TABS: 20.0000 mg | ORAL_TABLET | Freq: Every day | ORAL | 1 refills | Status: DC

## 2017-02-12 MED ORDER — ACETAMINOPHEN ER 650 MG PO TBCR: 1300.0000 mg | EXTENDED_RELEASE_TABLET | Freq: Two times a day (BID) | ORAL | 3 refills | Status: DC | PRN

## 2017-02-12 MED ORDER — ATORVASTATIN CALCIUM 10 MG PO TABS: 10.0000 mg | ORAL_TABLET | Freq: Every day | ORAL | 1 refills | Status: DC

## 2017-02-12 MED ORDER — TRAMADOL HCL 50 MG PO TABS: ORAL_TABLET | ORAL | 2 refills | Status: DC

## 2017-02-12 NOTE — Progress Notes (Addendum)
Patient ID: Erin Wong MRN: [id number], DOB: 09/11/1950, 66 y.o. Date of Encounter: @DATE @  Chief Complaint:  Chief Complaint  Patient presents with  . routine 6 month follow up    HPI: 66 y.o. year old female  presents For routine follow-up office visit.  As well today we reviewed that she had hospitalization for chest pain 01/13/17 through 01/14/17. Cardiac enzymes EKG chest x-ray d-dimer were all negative. She had a stress test was also negative. They started her on a statin and aspirin and had her discontinue Naprosyn secondary to renal insufficiency. She reports that she has had no further chest pain. She is taking the aspirin and statin. She has discontinued the Naprosyn.  She is taking blood pressure medications as directed. No lightheadedness. No LE edema.  Reviewed that at prior office visit I had prescribed Fosamax and she had had some adverse effects and subsequently changed to [facility]. She states that she has taken the [facility] and is having no adverse effects with this.  Today she reports that she is continuing to have pain with her right knee. Is requesting refill on pain medication. States that she has had 2 injections to the knee by Dr. Dennard Schaumann and does not want to do another injection unless she absolutely has to. Says that the second injection did not help as much as the first one had. I discussed seeing orthopedics and she says that she saw one orthopedic here on [address] who said there was nothing they could do because she has so much titanium etc. already in her knee. Husband notes that Dr. Dennard Schaumann did mention to them seeing another specialist about her knee. I asked if they would like to go ahead and have a referral to Dr. Maureen Ralphs. However husband says that finances are extremely tight and they really do not have the finances for that and they both agree that she would not have any type of procedure unless absolutely necessary.  No other concerns to address  today.  AT NEXT OV--- ADDRESS ASTHMA---WHETHER IT IS CONTROLLED, WHETHER HAVING ANY FLARES----CONSIDER STOPPING ADVAIR ANT REPLACING WITH ICS----------------------------- --------------ALSO VERIFY WHHETER SHE EVER SMOKED/HAS COMPONENT OF COPD----------------------------------------------------    Past Medical History:  Diagnosis Date  . Allergy   . Arthritis   . Asthma      Home Meds: Outpatient Medications Prior to Visit  Medication Sig Dispense Refill  . acetaminophen (TYLENOL) 650 MG CR tablet Take 1,300 mg by mouth 2 (two) times daily as needed for pain.    Marland Kitchen albuterol (PROVENTIL HFA;VENTOLIN HFA) 108 (90 BASE) MCG/ACT inhaler Inhale 1 puff into the lungs every 6 (six) hours as needed for wheezing or shortness of breath.    Marland Kitchen aspirin EC 81 MG tablet Take 1 tablet (81 mg total) by mouth daily. 30 tablet 11  . atorvastatin (LIPITOR) 10 MG tablet Take 1 tablet (10 mg total) by mouth daily at 6 PM. 30 tablet 0  . benazepril (LOTENSIN) 20 MG tablet Take 1 tablet (20 mg total) by mouth daily. 90 tablet 1  . Calcium 500 MG tablet Take 1 tablet (500 mg total) by mouth 3 (three) times daily. (Patient taking differently: Take 500 mg by mouth daily. ) 90 tablet   . Carbamide Peroxide (EAR WAX DROPS OT) Place 2 drops into the right ear daily as needed (ear wax removal).    . cholecalciferol (VITAMIN D) 1000 units tablet Take 1 tablet (1,000 Units total) by mouth daily. 90 tablet 1  .  fluticasone (FLONASE) 50 MCG/ACT nasal spray Place 2 sprays into both nostrils daily. (Patient taking differently: Place 2 sprays into both nostrils daily as needed for allergies or rhinitis. ) 16 g 11  . Fluticasone-Salmeterol (ADVAIR) 100-50 MCG/DOSE AEPB Inhale 1 puff into the lungs 2 (two) times daily. (Patient taking differently: Inhale 1 puff into the lungs See admin instructions. Inhale 1 puff twice daily, may also inhale 1 puff during the night as needed for shortness of breath) 180 each 1  . ibandronate  (BONIVA) 150 MG tablet Take (1) tab in AM once a month with a full glass of water, on empty stomach. Do not take anything else by mouth or lie down for 30 min. 3 tablet 3  . polyethylene glycol powder (GLYCOLAX/MIRALAX) powder Take 255 g by mouth 1 day or 1 dose. (Patient taking differently: Take 17 g by mouth daily. ) 255 g 5  . ranitidine (ZANTAC) 300 MG tablet Take 1 tablet (300 mg total) by mouth at bedtime. (Patient taking differently: Take 300 mg by mouth daily. ) 90 tablet 1  . traMADol (ULTRAM) 50 MG tablet TAKE 2 TABLETS 3 TIMES A DAY 180 tablet 2   No facility-administered medications prior to visit.     Allergies: No Known Allergies  Social History   Social History  . Marital status: Married    Spouse name: N/A  . Number of children: N/A  . Years of education: N/A   Occupational History  . Not on file.   Social History Main Topics  . Smoking status: Former Smoker    Quit date: 01/10/1991  . Smokeless tobacco: Never Used  . Alcohol use No  . Drug use: No  . Sexual activity: No   Other Topics Concern  . Not on file   Social History Narrative  . No narrative on file    Family History  Problem Relation Age of Onset  . Arthritis Mother   . Cancer Mother        Lung Cancer--Had quit smoking for 25 years  . Miscarriages / [place] Mother   . Alcohol abuse Father   . Alcohol abuse Brother   . COPD Brother   . Asthma Maternal Aunt   . Diabetes Maternal Aunt      Review of Systems:  See HPI for pertinent ROS. All other ROS negative.    Physical Exam: Blood pressure 132/74, pulse 77, temperature 98 F (36.7 C), temperature source Oral, resp. rate 16, weight 148 lb 9.6 oz (67.4 kg), SpO2 96 %., Body mass index is 25.51 kg/m. General: WNWD Appears in no acute distress. Neck: Supple. No thyromegaly. No lymphadenopathy. No carotid bruits. Lungs: Clear bilaterally to auscultation without wheezes, rales, or rhonchi. Breathing is unlabored. Heart: RRR with S1 S2.  No murmurs, rubs, or gallops. Abdomen: Soft, non-tender, non-distended with normoactive bowel sounds. No hepatomegaly. No rebound/guarding. No obvious abdominal masses. Musculoskeletal:  She walks with significant limp secondary to right knee pain. Extremities/Skin: Warm and dry.  No LE edema.  Neuro: Alert and oriented X 3. Moves all extremities spontaneously. Gait is normal. CNII-XII grossly in tact. Psych:  Responds to questions appropriately with a normal affect.     ASSESSMENT AND PLAN:  66 y.o. year old female with  1. Essential hypertension Blood Pressure is at goal/controlled. Continue current medication. Check lab to monitor. - COMPLETE METABOLIC PANEL WITH GFR  2. Age-related osteoporosis without current pathological fracture  DEXA performed 10/13/15--- shows Osteoporosis. T-scores -2.7 and -2.4. Told  her to start Fosamax 70 mg once weekly.  Continue vitamin D at 1000 units daily (Vitamin D level was checked at lab 07/17/15 and at that time was told to start 1000 units daily).  Start over-the-counter calcium  1000 mg daily and also weightbearing exercise.  08/01/2016 she reports that Fosamax caused vomiting. She has discontinued this. 08/01/2016--At this time I am changing her to [facility]. I have told her to call us and inform us if she has adverse effects with this.  02/12/2017--- she reports that she is taking the Boniva 150mg  once monthly. She continues to take the calcium and vitamin D.  3. Vitamin D deficiency 07/2016 vitamin D level was good at 36. Continue current dose of vitamin D.  ---Will wait to recheck lab. Her husband is always very concerned about finances and ordering any type of tests that are not absolutely necessary.  4. Hyperlipidemia, unspecified hyperlipidemia type Hospital 01/13/17 Lipitor 10 mg was started. Will recheck FLP LFT on medication now to monitor. - COMPLETE METABOLIC PANEL WITH GFR - Lipid panel  5. Mild intermittent asthma without  complication This is currently stable/controlled. Continue current medication for this.  6. Right knee pain Today I have sent in refill on her Tylenol and printed refill on her tramadol.   She had complete physical exam with me 07/2016. See that note for preventive care.   Routine follow-up visit 6 months or sooner if needed.   Signed, [address] [place], [place], [facility] 02/12/2017 8:22 AM

## 2017-02-14 ENCOUNTER — Telehealth: Payer: Self-pay

## 2017-02-14 DIAGNOSIS — M25561 Pain in right knee: Secondary | ICD-10-CM

## 2017-02-14 NOTE — Telephone Encounter (Signed)
Pt was seen in office on 6/13 and states she is still having problems with her knee and is requesting to go ahead with the referral to the knee dr that was recommended to her at her OV.  I will put referral in for pt to see Dr. Maureen Ralphs which was noted in last OV note

## 2017-02-17 NOTE — Telephone Encounter (Signed)
Approved.  

## 2017-04-10 ENCOUNTER — Other Ambulatory Visit: Payer: Self-pay | Admitting: Physician Assistant

## 2017-04-10 NOTE — Telephone Encounter (Signed)
Refill appropriate 

## 2017-04-17 ENCOUNTER — Other Ambulatory Visit: Payer: Self-pay | Admitting: Physician Assistant

## 2017-04-17 NOTE — Telephone Encounter (Signed)
Refill appropriate 

## 2017-06-02 ENCOUNTER — Other Ambulatory Visit: Payer: Self-pay | Admitting: Family Medicine

## 2017-06-02 NOTE — Telephone Encounter (Signed)
Approved. #[id number].

## 2017-06-02 NOTE — Telephone Encounter (Signed)
Last OV 6/13 Last refill 6/13 Ok to refill

## 2017-06-02 NOTE — Telephone Encounter (Signed)
rx called into pharmacy

## 2017-06-03 DIAGNOSIS — Z23 Encounter for immunization: Secondary | ICD-10-CM | POA: Diagnosis not present

## 2017-06-05 ENCOUNTER — Ambulatory Visit (INDEPENDENT_AMBULATORY_CARE_PROVIDER_SITE_OTHER): Payer: Medicare Other | Admitting: Family Medicine

## 2017-06-05 ENCOUNTER — Encounter: Payer: Self-pay | Admitting: Family Medicine

## 2017-06-05 VITALS — BP 130/74 | HR 81 | Temp 98.0°F | Resp 14 | Ht 65.0 in | Wt 153.0 lb

## 2017-06-05 DIAGNOSIS — M25561 Pain in right knee: Secondary | ICD-10-CM | POA: Diagnosis not present

## 2017-06-05 NOTE — Progress Notes (Signed)
Subjective:    Patient ID: Erin Wong, female    DOB: February 14, 1951, 66 y.o.   MRN: [id number]  HPI  Patient presents today with right knee pain. She has a previous history of a fracture in her lower leg status post ORIF of the tibia and fibula. Afterwards she has had chronic right knee pain that waxes and wanes. She is requesting a cortisone injection in her right knee. She is receiving this from time to time and has received substantial pain relief from these. I injected it in November of last year as well as in March of this year .  Pain seemed to be triggered by shopping this weekend. She was on her feet for more than 8 hours a day 3 days straight. Prolonged periods of walking and standing seem to exacerbate her knee pain. Pain is located anteriorly in the medial and lateral compartments. There is a slight effusion on exam today. There is no laxity to varus or valgus stress.     Past Medical History:  Diagnosis Date  . Allergy   . Arthritis   . Asthma    Past Surgical History:  Procedure Laterality Date  . ABDOMINAL HYSTERECTOMY  10/04/1995   Hysterectomy and Bilateral oophorectomy  . COLON SURGERY  09/2009   hole in colon  . FRACTURE SURGERY Right 08/02/2011   Right Leg--Plates & Screws  . 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 acetaminophen (TYLENOL) 650 MG CR tablet Take 2 tablets (1,300 mg total) by mouth 2 (two) times daily as needed for pain. 120 tablet 3  . ADVAIR DISKUS 100-50 MCG/DOSE AEPB INHALE 1 PUFF BY MOUTH TWICE A DAY 60 each 4  . albuterol (PROVENTIL HFA;VENTOLIN HFA) 108 (90 BASE) MCG/ACT inhaler Inhale 1 puff into the lungs every 6 (six) hours as needed for wheezing or shortness of breath.    Marland Kitchen aspirin EC 81 MG tablet Take 1 tablet (81 mg total) by mouth daily. 30 tablet 11  .  atorvastatin (LIPITOR) 10 MG tablet Take 1 tablet (10 mg total) by mouth daily at 6 PM. 90 tablet 1  . benazepril (LOTENSIN) 20 MG tablet Take 1 tablet (20 mg total) by mouth daily. 90 tablet 1  . Calcium 500 MG tablet Take 1 tablet (500 mg total) by mouth 3 (three) times daily. (Patient taking differently: Take 500 mg by mouth daily. ) 90 tablet   . Carbamide Peroxide (EAR WAX DROPS OT) Place 2 drops into the right ear daily as needed (ear wax removal).    . cholecalciferol (VITAMIN D) 1000 units tablet Take 1 tablet (1,000 Units total) by mouth daily. 90 tablet 1  . fluticasone (FLONASE) 50 MCG/ACT nasal spray Place 2 sprays into both nostrils daily. (Patient taking differently: Place 2 sprays into both nostrils daily as needed for allergies or rhinitis. ) 16 g 11  . polyethylene glycol powder (GLYCOLAX/MIRALAX) powder Take 255 g by mouth 1 day or 1 dose. (Patient taking differently: Take 17 g by mouth daily. ) 255 g 5  . ranitidine (ZANTAC) 300 MG tablet TAKE 1 TABLET (300 MG TOTAL) BY MOUTH AT BEDTIME. 90 tablet 1  . traMADol (ULTRAM) 50 MG tablet TAKE 2 TABLETS BY MOUTH 3 TIMES A DAY 180 tablet 2   No current facility-administered medications on file  prior to visit.    No Known Allergies Social History   Social History  . Marital status: Married    Spouse name: N/A  . Number of children: N/A  . Years of education: N/A   Occupational History  . Not on file.   Social History Main Topics  . Smoking status: Former Smoker    Quit date: 01/10/1991  . Smokeless tobacco: Never Used  . Alcohol use No  . Drug use: No  . Sexual activity: No   Other Topics Concern  . Not on file   Social History Narrative  . No narrative on file     Review of Systems  All other systems reviewed and are negative.      Objective:   Physical Exam  Constitutional: She appears well-developed and well-nourished.  Cardiovascular: Normal rate, regular rhythm and normal heart sounds.  Exam reveals no  gallop and no friction rub.   No murmur heard. Pulmonary/Chest: Effort normal and breath sounds normal. No respiratory distress. She has no wheezes. She has no rales.  Abdominal: Soft. Bowel sounds are normal. She exhibits no distension and no mass. There is no tenderness. There is no rebound and no guarding.  Musculoskeletal:       Right knee: She exhibits decreased range of motion and effusion. Tenderness found. Medial joint line and lateral joint line tenderness noted.  Vitals reviewed.         Assessment & Plan:   Right knee pain, unspecified chronicity  I believe the patient has right knee pain secondary to osteoarthritis. We discussed the risk of septic arthritis secondary to a cortisone shot and the patient wishes to proceed. Using sterile technique, I injected the right knee using a medial approach with 2 mL of lidocaine, 2 mL of Marcaine, and 2 mL of 40 mg per mL Kenalog. The patient tolerated the procedure well without complication

## 2017-07-14 ENCOUNTER — Other Ambulatory Visit: Payer: Self-pay

## 2017-07-14 MED ORDER — FLUTICASONE-SALMETEROL 100-50 MCG/DOSE IN AEPB: INHALATION_SPRAY | RESPIRATORY_TRACT | 4 refills | Status: DC

## 2017-07-14 NOTE — Telephone Encounter (Signed)
Refill appropriate 

## 2017-08-08 ENCOUNTER — Other Ambulatory Visit: Payer: Self-pay

## 2017-08-08 DIAGNOSIS — I10 Essential (primary) hypertension: Secondary | ICD-10-CM

## 2017-08-08 MED ORDER — BENAZEPRIL HCL 20 MG PO TABS: 20.0000 mg | ORAL_TABLET | Freq: Every day | ORAL | 0 refills | Status: DC

## 2017-08-08 MED ORDER — FLUTICASONE-SALMETEROL 100-50 MCG/DOSE IN AEPB: INHALATION_SPRAY | RESPIRATORY_TRACT | 4 refills | Status: DC

## 2017-08-08 MED ORDER — ATORVASTATIN CALCIUM 10 MG PO TABS: 10.0000 mg | ORAL_TABLET | Freq: Every day | ORAL | 1 refills | Status: DC

## 2017-08-08 NOTE — Telephone Encounter (Signed)
rx filled per protocol  

## 2017-08-13 ENCOUNTER — Ambulatory Visit: Payer: Medicare Other | Admitting: Physician Assistant

## 2017-08-14 ENCOUNTER — Ambulatory Visit: Payer: Medicare Other | Admitting: Physician Assistant

## 2017-08-14 ENCOUNTER — Ambulatory Visit (INDEPENDENT_AMBULATORY_CARE_PROVIDER_SITE_OTHER): Payer: Medicare Other | Admitting: Physician Assistant

## 2017-08-14 ENCOUNTER — Encounter: Payer: Self-pay | Admitting: Physician Assistant

## 2017-08-14 ENCOUNTER — Other Ambulatory Visit: Payer: Self-pay

## 2017-08-14 VITALS — BP 130/74 | HR 86 | Temp 97.9°F | Resp 16 | Ht 64.0 in | Wt 151.8 lb

## 2017-08-14 DIAGNOSIS — E785 Hyperlipidemia, unspecified: Secondary | ICD-10-CM | POA: Diagnosis not present

## 2017-08-14 DIAGNOSIS — M810 Age-related osteoporosis without current pathological fracture: Secondary | ICD-10-CM | POA: Diagnosis not present

## 2017-08-14 DIAGNOSIS — Z1159 Encounter for screening for other viral diseases: Secondary | ICD-10-CM

## 2017-08-14 DIAGNOSIS — I10 Essential (primary) hypertension: Secondary | ICD-10-CM | POA: Diagnosis not present

## 2017-08-14 MED ORDER — IBANDRONATE SODIUM 150 MG PO TABS: 150.0000 mg | ORAL_TABLET | ORAL | 11 refills | Status: DC

## 2017-08-14 NOTE — Progress Notes (Signed)
Patient ID: Erin Wong MRN: [id number], DOB: 1950-10-22, 66 y.o. Date of Encounter: @DATE @  Chief Complaint:  Chief Complaint  Patient presents with  . 6 month follow up    HPI: 66 y.o. year old female  presents for routine follow-up office visit.  She is taking blood pressure medications as directed. No lightheadedness. No LE edema.  She is taking Lipitor as directed.  No myalgias or other adverse effects.  Reviewed that at prior office visit I had prescribed Fosamax and she had had some adverse effects and subsequently changed to [facility].  At her OV with me 01/2017--She reported that she was taking the [facility] and was having no adverse effects with this. Today---08/14/2017--that Boniva is not on her medication list.  Asked about this.  He states that the medication did not have refill so she did not continue taking it.  Regarding her asthma/COPD--- states that she did smoke some in the past.  States that her asthma has flared some.  It flared when she had a respiratory infection but that has improved/ resolved now.   However, says that sometimes this cold weather seems to make it flare and she does have to use albuterol occasionally.  No other concerns to address today.     Past Medical History:  Diagnosis Date  . Allergy   . Arthritis   . Asthma      Home Meds: Outpatient Medications Prior to Visit  Medication Sig Dispense Refill  . acetaminophen (TYLENOL) 650 MG CR tablet Take 2 tablets (1,300 mg total) by mouth 2 (two) times daily as needed for pain. 120 tablet 3  . albuterol (PROVENTIL HFA;VENTOLIN HFA) 108 (90 BASE) MCG/ACT inhaler Inhale 1 puff into the lungs every 6 (six) hours as needed for wheezing or shortness of breath.    Marland Kitchen aspirin EC 81 MG tablet Take 1 tablet (81 mg total) by mouth daily. 30 tablet 11  . atorvastatin (LIPITOR) 10 MG tablet Take 1 tablet (10 mg total) by mouth daily at 6 PM. 90 tablet 1  . benazepril (LOTENSIN) 20 MG tablet Take 1 tablet  (20 mg total) by mouth daily. 90 tablet 0  . Calcium 500 MG tablet Take 1 tablet (500 mg total) by mouth 3 (three) times daily. (Patient taking differently: Take 500 mg by mouth daily. ) 90 tablet   . Carbamide Peroxide (EAR WAX DROPS OT) Place 2 drops into the right ear daily as needed (ear wax removal).    . cholecalciferol (VITAMIN D) 1000 units tablet Take 1 tablet (1,000 Units total) by mouth daily. 90 tablet 1  . fluticasone (FLONASE) 50 MCG/ACT nasal spray Place 2 sprays into both nostrils daily. (Patient taking differently: Place 2 sprays into both nostrils daily as needed for allergies or rhinitis. ) 16 g 11  . Fluticasone-Salmeterol (ADVAIR DISKUS) 100-50 MCG/DOSE AEPB INHALE 1 PUFF BY MOUTH TWICE A DAY 60 each 4  . polyethylene glycol powder (GLYCOLAX/MIRALAX) powder Take 255 g by mouth 1 day or 1 dose. (Patient taking differently: Take 17 g by mouth daily. ) 255 g 5  . ranitidine (ZANTAC) 300 MG tablet TAKE 1 TABLET (300 MG TOTAL) BY MOUTH AT BEDTIME. 90 tablet 1  . traMADol (ULTRAM) 50 MG tablet TAKE 2 TABLETS BY MOUTH 3 TIMES A DAY 180 tablet 2   No facility-administered medications prior to visit.     Allergies: No Known Allergies  Social History   Socioeconomic History  . Marital status: Married  Spouse name: Not on file  . Number of children: Not on file  . Years of education: Not on file  . Highest education level: Not on file  Social Needs  . Financial resource strain: Not on file  . Food insecurity - worry: Not on file  . Food insecurity - inability: Not on file  . Transportation needs - medical: Not on file  . Transportation needs - non-medical: Not on file  Occupational History  . Not on file  Tobacco Use  . Smoking status: Former Smoker    Last attempt to quit: 01/10/1991    Years since quitting: 26.6  . Smokeless tobacco: Never Used  Substance and Sexual Activity  . Alcohol use: No  . Drug use: No  . Sexual activity: No  Other Topics Concern  . Not on  file  Social History Narrative  . Not on file    Family History  Problem Relation Age of Onset  . Arthritis Mother   . Cancer Mother        Lung Cancer--Had quit smoking for 25 years  . Miscarriages / [place] Mother   . Alcohol abuse Father   . Alcohol abuse Brother   . COPD Brother   . Asthma Maternal Aunt   . Diabetes Maternal Aunt      Review of Systems:  See HPI for pertinent ROS. All other ROS negative.    Physical Exam: Blood pressure 130/74, pulse 86, temperature 97.9 F (36.6 C), temperature source Oral, resp. rate 16, height 5\' 4"  (1.626 m), weight 68.9 kg (151 lb 12.8 oz), SpO2 98 %., Body mass index is 26.06 kg/m. General: WNWD Appears in no acute distress. Neck: Supple. No thyromegaly. No lymphadenopathy. No carotid bruits. Lungs: Clear bilaterally to auscultation without wheezes, rales, or rhonchi. Breathing is unlabored. Heart: RRR with S1 S2. No murmurs, rubs, or gallops. Abdomen: Soft, non-tender, non-distended with normoactive bowel sounds. No hepatomegaly. No rebound/guarding. No obvious abdominal masses. Musculoskeletal:  She walks with significant limp secondary to right knee pain. Extremities/Skin: Warm and dry.  No LE edema.  Neuro: Alert and oriented X 3. Moves all extremities spontaneously. Gait is normal. CNII-XII grossly in tact. Psych:  Responds to questions appropriately with a normal affect.     ASSESSMENT AND PLAN:  66 y.o. year old female with  1. Essential hypertension  Blood Pressure is at goal/controlled. Continue current medication. Check lab to monitor. - COMPLETE METABOLIC PANEL WITH GFR  2. Age-related osteoporosis without current pathological fracture  DEXA performed 10/13/15--- shows Osteoporosis. T-scores -2.7 and -2.4. Told her to start Fosamax 70 mg once weekly.  Continue vitamin D at 1000 units daily (Vitamin D level was checked at lab 07/17/15 and at that time was told to start 1000 units daily).  Start over-the-counter  calcium  1000 mg daily and also weightbearing exercise.  08/01/2016 she reports that Fosamax caused vomiting. She has discontinued this. 08/01/2016--At this time I am changing her to [facility]. I have told her to call us and inform us if she has adverse effects with this.  02/12/2017--- she reports that she is taking the Boniva 150mg  once monthly. She continues to take the calcium and vitamin D. 08/14/2017---Reviewed that at prior office visit I had prescribed Fosamax and she had had some adverse effects and subsequently changed to [facility].  At her OV with me 01/2017--She reported that she was taking the [facility] and was having no adverse effects with this. Today---08/14/2017--that Jaclyn Prime is not on her medication  list.  Asked about this.  He states that the medication did not have refill so she did not continue taking it. 08/14/2017----sent in another prescription for the [facility] with 1 year of refills. Will wait to do a follow-up bone density scan since she has not been on continuous therapy much at all since her DEXA scan 10/2015.  3. Vitamin D deficiency 07/2016 vitamin D level was good at 36. Continue current dose of vitamin D.  ---Will wait to recheck lab. Her husband is always very concerned about finances and ordering any type of tests that are not absolutely necessary.  4. Hyperlipidemia, unspecified hyperlipidemia type Hospital 01/13/17 Lipitor 10 mg was started.  She had follow-up FLP/LFTs 01/2017.  LDL 60.  Continue Lipitor 10 mg daily. 01/12/17--- Recheck LFTs to monitor.  5. Mild intermittent asthma without complication This is currently stable/controlled. Continue current medication for this. She has had some flares of her asthma so we will continue current med and will not step down this therapy.  Will  also will continue to use the albuterol as needed.   She had complete physical exam with me 07/2016. See that note for preventive care.   Routine follow-up visit 6 months or sooner if  needed.   Signed, [address] [place], [place], [facility] 08/14/2017 11:13 AM

## 2017-08-15 LAB — HEPATITIS C ANTIBODY
Hepatitis C Ab: NONREACTIVE
SIGNAL TO CUT-OFF: 0.01 (ref <1.00)

## 2017-08-15 LAB — COMPLETE METABOLIC PANEL WITH GFR
AG Ratio: 1.6 (calc) (ref 1.0–2.5)
ALBUMIN MSPROF: 4.1 g/dL (ref 3.6–5.1)
ALKALINE PHOSPHATASE (APISO): 89 U/L (ref 33–130)
ALT: 24 U/L (ref 6–29)
AST: 22 U/L (ref 10–35)
BUN: 20 mg/dL (ref 7–25)
CALCIUM: 10 mg/dL (ref 8.6–10.4)
CO2: 24 mmol/L (ref 20–32)
CREATININE: 0.96 mg/dL (ref 0.50–0.99)
Chloride: 106 mmol/L (ref 98–110)
GFR, EST AFRICAN AMERICAN: 71 mL/min/{1.73_m2} (ref >=60)
GFR, EST NON AFRICAN AMERICAN: 61 mL/min/{1.73_m2} (ref >=60)
GLOBULIN: 2.6 g/dL (ref 1.9–3.7)
GLUCOSE: 94 mg/dL (ref 65–99)
Potassium: 5.2 mmol/L (ref 3.5–5.3)
SODIUM: 141 mmol/L (ref 135–146)
TOTAL PROTEIN: 6.7 g/dL (ref 6.1–8.1)
Total Bilirubin: 0.2 mg/dL (ref 0.2–1.2)

## 2017-08-15 LAB — EXTRA LAV TOP TUBE

## 2017-10-15 ENCOUNTER — Other Ambulatory Visit: Payer: Self-pay | Admitting: Physician Assistant

## 2017-10-15 NOTE — Telephone Encounter (Signed)
Last OV 08/14/2017 Last refill 06/02/2017 Okay to refill?

## 2017-10-15 NOTE — Telephone Encounter (Signed)
Approved. Sent.

## 2017-10-22 ENCOUNTER — Other Ambulatory Visit: Payer: Self-pay | Admitting: Physician Assistant

## 2017-10-22 NOTE — Telephone Encounter (Signed)
Refill appropriate 

## 2017-11-06 ENCOUNTER — Other Ambulatory Visit: Payer: Self-pay | Admitting: Physician Assistant

## 2017-11-06 DIAGNOSIS — I10 Essential (primary) hypertension: Secondary | ICD-10-CM

## 2017-12-02 ENCOUNTER — Ambulatory Visit: Payer: Medicare Other | Admitting: Family Medicine

## 2017-12-04 ENCOUNTER — Encounter: Payer: Self-pay | Admitting: Family Medicine

## 2017-12-04 ENCOUNTER — Ambulatory Visit (INDEPENDENT_AMBULATORY_CARE_PROVIDER_SITE_OTHER): Payer: Medicare Other | Admitting: Family Medicine

## 2017-12-04 VITALS — BP 120/70 | HR 78 | Temp 98.1°F | Resp 14 | Ht 64.0 in | Wt 151.0 lb

## 2017-12-04 DIAGNOSIS — M25561 Pain in right knee: Secondary | ICD-10-CM

## 2017-12-04 DIAGNOSIS — M5431 Sciatica, right side: Secondary | ICD-10-CM

## 2017-12-04 NOTE — Progress Notes (Signed)
Subjective:    Patient ID: Erin Wong, female    DOB: 01-10-1951, 67 y.o.   MRN: [id number]  HPI 10/18 Patient presents today with right knee pain. She has a previous history of a fracture in her lower leg status post ORIF of the tibia and fibula. Afterwards she has had chronic right knee pain that waxes and wanes. She is requesting a cortisone injection in her right knee. She is receiving this from time to time and has received substantial pain relief from these. I injected it in November of last year as well as in March of this year .  Pain seemed to be triggered by shopping this weekend. She was on her feet for more than 8 hours a day 3 days straight. Prolonged periods of walking and standing seem to exacerbate her knee pain. Pain is located anteriorly in the medial and lateral compartments. There is a slight effusion on exam today. There is no laxity to varus or valgus stress. At that time, my plan was: I believe the patient has right knee pain secondary to osteoarthritis. We discussed the risk of septic arthritis secondary to a cortisone shot and the patient wishes to proceed. Using sterile technique, I injected the right knee using a medial approach with 2 mL of lidocaine, 2 mL of Marcaine, and 2 mL of 40 mg per mL Kenalog. The patient tolerated the procedure well without complication  12/31/00 Patient states previous injection worked well from October all the way until February however it gradually wore off in February and she has been experiencing pain in the medial and the lateral joint lines of the right knee ever since.  Primarily located over the lateral joint line is majority of her pain.  She is requesting a repeat cortisone injection today.  However she is also having pain radiate from her posterior right hip down the back of her right leg past the knee across the dorsum of the right shin into her foot with some numbness and tingling in the toes.  This sounds more consistent with  sciatica.   Past Medical History:  Diagnosis Date  . Allergy   . Arthritis   . Asthma    Past Surgical History:  Procedure Laterality Date  . ABDOMINAL HYSTERECTOMY  10/04/1995   Hysterectomy and Bilateral oophorectomy  . COLON SURGERY  09/2009   hole in colon  . FRACTURE SURGERY Right 08/02/2011   Right Leg--Plates & Screws  . 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   at time of colon surgery   Current Outpatient Medications on File Prior to Visit  Medication Sig Dispense Refill  . acetaminophen (TYLENOL) 650 MG CR tablet Take 2 tablets (1,300 mg total) by mouth 2 (two) times daily as needed for pain. 120 tablet 3  . albuterol (PROVENTIL HFA;VENTOLIN HFA) 108 (90 BASE) MCG/ACT inhaler Inhale 1 puff into the lungs every 6 (six) hours as needed for wheezing or shortness of breath.    Marland Kitchen aspirin EC 81 MG tablet Take 1 tablet (81 mg total) by mouth daily. 30 tablet 11  . atorvastatin (LIPITOR) 10 MG tablet Take 1 tablet (10 mg total) by mouth daily at 6 PM. 90 tablet 1  . benazepril (LOTENSIN) 20 MG tablet TAKE 1 TABLET BY MOUTH EVERY DAY 90 tablet 0  . Calcium 500 MG tablet Take 1 tablet (500 mg total) by mouth 3 (three) times daily. (Patient taking differently: Take 500  mg by mouth daily. ) 90 tablet   . Carbamide Peroxide (EAR WAX DROPS OT) Place 2 drops into the right ear daily as needed (ear wax removal).    . cholecalciferol (VITAMIN D) 1000 units tablet Take 1 tablet (1,000 Units total) by mouth daily. 90 tablet 1  . fluticasone (FLONASE) 50 MCG/ACT nasal spray Place 2 sprays into both nostrils daily. (Patient taking differently: Place 2 sprays into both nostrils daily as needed for allergies or rhinitis. ) 16 g 11  . Fluticasone-Salmeterol (ADVAIR DISKUS) 100-50 MCG/DOSE AEPB INHALE 1 PUFF BY MOUTH TWICE A DAY 60 each 4  . ibandronate (BONIVA) 150 MG tablet Take 1 tablet (150 mg total) by mouth every 30 (thirty) days.  1 tablet 11  . polyethylene glycol powder (GLYCOLAX/MIRALAX) powder Take 255 g by mouth 1 day or 1 dose. (Patient taking differently: Take 17 g by mouth daily. ) 255 g 5  . ranitidine (ZANTAC) 300 MG tablet TAKE 1 TABLET (300 MG TOTAL) BY MOUTH AT BEDTIME. 90 tablet 1  . traMADol (ULTRAM) 50 MG tablet TAKE 2 TABLETS 3 TIMES A DAY 180 tablet 2   No current facility-administered medications on file prior to visit.    No Known Allergies Social History   Socioeconomic History  . Marital status: Married    Spouse name: Not on file  . Number of children: Not on file  . Years of education: Not on file  . Highest education level: Not on file  Occupational History  . Not on file  Social Needs  . Financial resource strain: Not on file  . Food insecurity:    Worry: Not on file    Inability: Not on file  . Transportation needs:    Medical: Not on file    Non-medical: Not on file  Tobacco Use  . Smoking status: Former Smoker    Last attempt to quit: 01/10/1991    Years since quitting: 26.9  . Smokeless tobacco: Never Used  Substance and Sexual Activity  . Alcohol use: No  . Drug use: No  . Sexual activity: Never  Lifestyle  . Physical activity:    Days per week: Not on file    Minutes per session: Not on file  . Stress: Not on file  Relationships  . Social connections:    Talks on phone: Not on file    Gets together: Not on file    Attends religious service: Not on file    Active member of club or organization: Not on file    Attends meetings of clubs or organizations: Not on file    Relationship status: Not on file  . Intimate partner violence:    Fear of current or ex partner: Not on file    Emotionally abused: Not on file    Physically abused: Not on file    Forced sexual activity: Not on file  Other Topics Concern  . Not on file  Social History Narrative  . Not on file     Review of Systems  All other systems reviewed and are negative.      Objective:    Physical Exam  Constitutional: She appears well-developed and well-nourished.  Cardiovascular: Normal rate, regular rhythm and normal heart sounds. Exam reveals no gallop and no friction rub.  No murmur heard. Pulmonary/Chest: Effort normal and breath sounds normal. No respiratory distress. She has no wheezes. She has no rales.  Abdominal: Soft. Bowel sounds are normal. She exhibits no distension and no  mass. There is no tenderness. There is no rebound and no guarding.  Musculoskeletal:       Right knee: She exhibits decreased range of motion and effusion. Tenderness found. Medial joint line and lateral joint line tenderness noted.  Vitals reviewed.         Assessment & Plan:  Right knee pain, unspecified chronicity  Right sided sciatica - Plan: DG Lumbar Spine Complete  Using sterile technique, the right knee was injected with 2 cc lidocaine, 2 cc of Marcaine, and 2 cc of 40 mg/mL Kenalog.  Patient tolerated the procedure well without complication.  If she continues to experience pain radiating from her right hip down the right leg, I have ordered an x-ray of her lumbar spine to evaluate further.  She will notify me if the pain persists.

## 2018-01-05 ENCOUNTER — Other Ambulatory Visit: Payer: Self-pay

## 2018-01-05 MED ORDER — ALBUTEROL SULFATE HFA 108 (90 BASE) MCG/ACT IN AERS: 1.0000 | INHALATION_SPRAY | Freq: Four times a day (QID) | RESPIRATORY_TRACT | 3 refills | Status: DC | PRN

## 2018-01-31 ENCOUNTER — Other Ambulatory Visit: Payer: Self-pay | Admitting: Physician Assistant

## 2018-01-31 DIAGNOSIS — I10 Essential (primary) hypertension: Secondary | ICD-10-CM

## 2018-02-04 ENCOUNTER — Other Ambulatory Visit: Payer: Self-pay | Admitting: Physician Assistant

## 2018-02-16 ENCOUNTER — Ambulatory Visit: Payer: Medicare Other | Admitting: Physician Assistant

## 2018-02-17 ENCOUNTER — Other Ambulatory Visit: Payer: Self-pay | Admitting: Physician Assistant

## 2018-02-17 NOTE — Telephone Encounter (Signed)
Ok to refill??  Last office visit 12/04/2017.  Last refill 10/15/2017, #2 refills.

## 2018-02-18 ENCOUNTER — Ambulatory Visit (INDEPENDENT_AMBULATORY_CARE_PROVIDER_SITE_OTHER): Payer: Medicare Other | Admitting: Physician Assistant

## 2018-02-18 ENCOUNTER — Other Ambulatory Visit: Payer: Self-pay

## 2018-02-18 ENCOUNTER — Encounter: Payer: Self-pay | Admitting: Physician Assistant

## 2018-02-18 VITALS — BP 142/76 | HR 85 | Temp 98.1°F | Resp 16 | Ht 64.0 in | Wt 153.0 lb

## 2018-02-18 DIAGNOSIS — I10 Essential (primary) hypertension: Secondary | ICD-10-CM | POA: Diagnosis not present

## 2018-02-18 DIAGNOSIS — M810 Age-related osteoporosis without current pathological fracture: Secondary | ICD-10-CM

## 2018-02-18 DIAGNOSIS — J4520 Mild intermittent asthma, uncomplicated: Secondary | ICD-10-CM

## 2018-02-18 DIAGNOSIS — E785 Hyperlipidemia, unspecified: Secondary | ICD-10-CM | POA: Diagnosis not present

## 2018-02-18 MED ORDER — IBANDRONATE SODIUM 150 MG PO TABS: 150.0000 mg | ORAL_TABLET | ORAL | 11 refills | Status: DC

## 2018-02-18 MED ORDER — AZITHROMYCIN 250 MG PO TABS: ORAL_TABLET | ORAL | 0 refills | Status: DC

## 2018-02-18 MED ORDER — PREDNISONE 20 MG PO TABS: 20.0000 mg | ORAL_TABLET | Freq: Every day | ORAL | 0 refills | Status: DC

## 2018-02-18 NOTE — Progress Notes (Signed)
Patient ID: Erin Wong MRN: [id number], DOB: [birth year], 67 y.o. Date of Encounter: @DATE @  Chief Complaint:  Chief Complaint  Patient presents with  . 6 month follow up with cholesterol and bloodpressure  . Cough    symptoms started 2-3 weeks ago having to use inhaler    HPI: 67 y.o. year old female  presents for routine follow-up office visit.  She is taking blood pressure medications as directed. No lightheadedness. No LE edema.  She is taking Lipitor as directed.  No myalgias or other adverse effects.  Reviewed that at prior office visit I had prescribed Fosamax and she had had some adverse effects and subsequently changed to [facility].  At her OV with me 01/2017--She reported that she was taking the [facility] and was having no adverse effects with this. At [place]  08/14/2017-- noted that Boniva is not on her medication list.  Asked about this.  She states that the medication did not have refill so she did not continue taking it. At [place] 02/18/2018----she reports that the [facility] never got restarted.  Today I have told her to restart taking this and is sent in year refill to make sure that it is available at pharmacy for her to restart.  Regarding her asthma/COPD--- states that she did smoke some in the past.  States that her asthma has flared some.  It flared when she had a respiratory infection but that has improved/ resolved now.   However, says that sometimes this cold weather seems to make it flare and she does have to use albuterol occasionally.  Today she reports that she has been having a cough for the last couple of weeks.  She cannot tell whether there is phlegm and congestion present or not.  Has had no fever.  Is having no nasal congestion or mucus from the nose.  Just cough.  No other concerns to address today.     Past Medical History:  Diagnosis Date  . Allergy   . Arthritis   . Asthma      Home Meds: Outpatient Medications Prior to Visit  Medication Sig Dispense  Refill  . acetaminophen (TYLENOL) 650 MG CR tablet Take 2 tablets (1,300 mg total) by mouth 2 (two) times daily as needed for pain. 120 tablet 3  . albuterol (PROVENTIL HFA;VENTOLIN HFA) 108 (90 Base) MCG/ACT inhaler Inhale 1 puff into the lungs every 6 (six) hours as needed for wheezing or shortness of breath. 18 g 3  . atorvastatin (LIPITOR) 10 MG tablet TAKE 1 TABLET (10 MG TOTAL) BY MOUTH DAILY AT 6 PM. 90 tablet 1  . benazepril (LOTENSIN) 20 MG tablet TAKE 1 TABLET BY MOUTH EVERY DAY 90 tablet 0  . Calcium 500 MG tablet Take 1 tablet (500 mg total) by mouth 3 (three) times daily. (Patient taking differently: Take 500 mg by mouth daily. ) 90 tablet   . Carbamide Peroxide (EAR WAX DROPS OT) Place 2 drops into the right ear daily as needed (ear wax removal).    . cholecalciferol (VITAMIN D) 1000 units tablet Take 1 tablet (1,000 Units total) by mouth daily. 90 tablet 1  . fluticasone (FLONASE) 50 MCG/ACT nasal spray Place 2 sprays into both nostrils daily. (Patient taking differently: Place 2 sprays into both nostrils daily as needed for allergies or rhinitis. ) 16 g 11  . Fluticasone-Salmeterol (ADVAIR DISKUS) 100-50 MCG/DOSE AEPB INHALE 1 PUFF BY MOUTH TWICE A DAY 60 each 4  . ibandronate (BONIVA) 150 MG tablet  Take 1 tablet (150 mg total) by mouth every 30 (thirty) days. 1 tablet 11  . polyethylene glycol powder (GLYCOLAX/MIRALAX) powder Take 255 g by mouth 1 day or 1 dose. (Patient taking differently: Take 17 g by mouth daily. ) 255 g 5  . ranitidine (ZANTAC) 300 MG tablet TAKE 1 TABLET (300 MG TOTAL) BY MOUTH AT BEDTIME. 90 tablet 1  . traMADol (ULTRAM) 50 MG tablet TAKE 2 TABLETS 3 TIMES A DAY 180 tablet 2   No facility-administered medications prior to visit.     Allergies: No Known Allergies  Social History   Socioeconomic History  . Marital status: Married    Spouse name: Not on file  . Number of children: Not on file  . Years of education: Not on file  . Highest education  level: Not on file  Occupational History  . Not on file  Social Needs  . Financial resource strain: Not on file  . Food insecurity:    Worry: Not on file    Inability: Not on file  . Transportation needs:    Medical: Not on file    Non-medical: Not on file  Tobacco Use  . Smoking status: Former Smoker    Last attempt to quit: 01/10/1991    Years since quitting: 27.1  . Smokeless tobacco: Never Used  Substance and Sexual Activity  . Alcohol use: No  . Drug use: No  . Sexual activity: Never  Lifestyle  . Physical activity:    Days per week: Not on file    Minutes per session: Not on file  . Stress: Not on file  Relationships  . Social connections:    Talks on phone: Not on file    Gets together: Not on file    Attends religious service: Not on file    Active member of club or organization: Not on file    Attends meetings of clubs or organizations: Not on file    Relationship status: Not on file  . Intimate partner violence:    Fear of current or ex partner: Not on file    Emotionally abused: Not on file    Physically abused: Not on file    Forced sexual activity: Not on file  Other Topics Concern  . Not on file  Social History Narrative  . Not on file    Family History  Problem Relation Age of Onset  . Arthritis Mother   . Cancer Mother        Lung Cancer--Had quit smoking for 25 years  . Miscarriages / [place] Mother   . Alcohol abuse Father   . Alcohol abuse Brother   . COPD Brother   . Asthma Maternal Aunt   . Diabetes Maternal Aunt      Review of Systems:  See HPI for pertinent ROS. All other ROS negative.    Physical Exam: Blood pressure (!) 142/76, pulse 85, temperature 98.1 F (36.7 C), resp. rate 16, height 5\' 4"  (1.626 m), weight 69.4 kg (153 lb), SpO2 95 %., Body mass index is 26.26 kg/m. General: WNWD WF. Appears in no acute distress. Head: Normocephalic, atraumatic, eyes without discharge, sclera non-icteric, nares are without discharge.  Bilateral auditory canals clear, TM's are without perforation, pearly grey and translucent with reflective cone of light bilaterally. Oral cavity moist, posterior pharynx without exudate, erythema.  Neck: Supple. No thyromegaly. No lymphadenopathy.  No carotid bruit. Lungs: Clear bilaterally to auscultation without wheezes, rales, or rhonchi. Breathing is unlabored. Heart: RRR  with S1 S2. No murmurs, rubs, or gallops. Abdomen: Soft, non-tender, non-distended with normoactive bowel sounds. No hepatomegaly. No rebound/guarding. No obvious abdominal masses. Musculoskeletal:  Strength and tone normal for age. Extremities/Skin: Warm and dry.  No LE edema. Neuro: Alert and oriented X 3. Moves all extremities spontaneously. Gait is normal. CNII-XII grossly in tact. Psych:  Responds to questions appropriately with a normal affect.     ASSESSMENT AND PLAN:  67 y.o. year old female with   1. Essential hypertension 02/18/2018: Blood Pressure is at goal/controlled. Continue current medication. Check lab to monitor. - COMPLETE METABOLIC PANEL WITH GFR  2. Age-related osteoporosis without current pathological fracture  DEXA performed 10/13/15--- shows Osteoporosis. T-scores -2.7 and -2.4. Told her to start Fosamax 70 mg once weekly.  Continue vitamin D at 1000 units daily (Vitamin D level was checked at lab 07/17/15 and at that time was told to start 1000 units daily).  Start over-the-counter calcium  1000 mg daily and also weightbearing exercise.  08/01/2016 she reports that Fosamax caused vomiting. She has discontinued this. 08/01/2016--At this time I am changing her to [facility]. I have told her to call us and inform us if she has adverse effects with this.  02/12/2017--- she reports that she is taking the Boniva 150mg  once monthly. She continues to take the calcium and vitamin D. 08/14/2017---Reviewed that at prior office visit I had prescribed Fosamax and she had had some adverse effects and  subsequently changed to [facility].  At her OV with me 01/2017--She reported that she was taking the [facility] and was having no adverse effects with this. Today---08/14/2017--that Boniva is not on her medication list.  Asked about this.  She states that the medication did not have refill so she did not continue taking it. 08/14/2017----sent in another prescription for the [facility] with 1 year of refills. 02/18/2018-----again today she reports that she did not restart the [place] yet.  Again today I told her to restart it and is sent in another prescription for 1 year of refills. Will wait to do a follow-up bone density scan since she has not been on continuous therapy much at all since her DEXA scan 10/2015.  3. Vitamin D deficiency 07/2016 vitamin D level was good at 36. Continue current dose of vitamin D.  ---Will wait to recheck lab. Her husband is always very concerned about finances and ordering any type of tests that are not absolutely necessary.  4. Hyperlipidemia, unspecified hyperlipidemia type Hospital 01/13/17 Lipitor 10 mg was started.  She had follow-up FLP/LFTs 01/2017.  LDL 60.  Continue Lipitor 10 mg daily. 01/12/17--- Recheck LFTs to monitor. 02/18/2018: She is not fasting today but will return tomorrow morning fasting for labs.  She is taking Lipitor currently.  Mild intermittent asthma without complication 10/02/8655: She has had recent cough for the last 2 weeks.  Not sure if this is just a flare of asthma or whether it is also secondary to underlying infection.  Will treat with antibiotic and low-dose prednisone.  Follow-up if symptoms do not resolve after completion of these. - azithromycin (ZITHROMAX) 250 MG tablet; Day 1: Take 2 daily. Days 2 -5: Take 1 daily.  Dispense: 6 tablet; Refill: 0 - predniSONE (DELTASONE) 20 MG tablet; Take 1 tablet (20 mg total) by mouth daily with breakfast.  Dispense: 5 tablet; Refill: 0     She had complete physical exam with me 07/2016. See that note  for preventive care.   Routine follow-up visit 6 months or  sooner if needed.   Marin Olp [place], [place], [facility] 02/18/2018 2:13 PM

## 2018-02-19 ENCOUNTER — Other Ambulatory Visit: Payer: Medicare Other

## 2018-02-19 DIAGNOSIS — E785 Hyperlipidemia, unspecified: Secondary | ICD-10-CM

## 2018-02-19 DIAGNOSIS — I10 Essential (primary) hypertension: Secondary | ICD-10-CM | POA: Diagnosis not present

## 2018-02-19 LAB — LIPID PANEL
Cholesterol: 208 mg/dL — ABNORMAL HIGH (ref <200)
HDL: 92 mg/dL (ref >50)
LDL Cholesterol (Calc): 93 mg/dL (calc)
NON-HDL CHOLESTEROL (CALC): 116 mg/dL (ref <130)
Total CHOL/HDL Ratio: 2.3 (calc) (ref <5.0)
Triglycerides: 129 mg/dL (ref <150)

## 2018-02-19 LAB — EXTRA LAV TOP TUBE

## 2018-02-19 LAB — COMPLETE METABOLIC PANEL WITH GFR
AG Ratio: 1.9 (calc) (ref 1.0–2.5)
ALBUMIN MSPROF: 4.1 g/dL (ref 3.6–5.1)
ALKALINE PHOSPHATASE (APISO): 77 U/L (ref 33–130)
ALT: 18 U/L (ref 6–29)
AST: 16 U/L (ref 10–35)
BILIRUBIN TOTAL: 0.4 mg/dL (ref 0.2–1.2)
BUN: 19 mg/dL (ref 7–25)
CALCIUM: 9.7 mg/dL (ref 8.6–10.4)
CHLORIDE: 108 mmol/L (ref 98–110)
CO2: 24 mmol/L (ref 20–32)
CREATININE: 0.88 mg/dL (ref 0.50–0.99)
GFR, Est African American: 79 mL/min/{1.73_m2} (ref >=60)
GFR, Est Non African American: 68 mL/min/{1.73_m2} (ref >=60)
Globulin: 2.2 g/dL (calc) (ref 1.9–3.7)
Glucose, Bld: 89 mg/dL (ref 65–99)
Potassium: 5.1 mmol/L (ref 3.5–5.3)
SODIUM: 141 mmol/L (ref 135–146)
Total Protein: 6.3 g/dL (ref 6.1–8.1)

## 2018-03-27 ENCOUNTER — Other Ambulatory Visit: Payer: Self-pay | Admitting: Physician Assistant

## 2018-04-14 ENCOUNTER — Other Ambulatory Visit: Payer: Self-pay | Admitting: Physician Assistant

## 2018-04-29 ENCOUNTER — Other Ambulatory Visit: Payer: Self-pay | Admitting: Physician Assistant

## 2018-04-29 DIAGNOSIS — I10 Essential (primary) hypertension: Secondary | ICD-10-CM

## 2018-05-06 ENCOUNTER — Ambulatory Visit (INDEPENDENT_AMBULATORY_CARE_PROVIDER_SITE_OTHER): Payer: Medicare Other | Admitting: Physician Assistant

## 2018-05-06 ENCOUNTER — Encounter: Payer: Self-pay | Admitting: Physician Assistant

## 2018-05-06 VITALS — BP 142/82 | HR 74 | Temp 97.8°F | Resp 14 | Ht 62.0 in | Wt 151.0 lb

## 2018-05-06 DIAGNOSIS — Z780 Asymptomatic menopausal state: Secondary | ICD-10-CM

## 2018-05-06 DIAGNOSIS — Z1231 Encounter for screening mammogram for malignant neoplasm of breast: Secondary | ICD-10-CM | POA: Diagnosis not present

## 2018-05-06 DIAGNOSIS — M810 Age-related osteoporosis without current pathological fracture: Secondary | ICD-10-CM | POA: Diagnosis not present

## 2018-05-06 DIAGNOSIS — Z23 Encounter for immunization: Secondary | ICD-10-CM | POA: Diagnosis not present

## 2018-05-06 DIAGNOSIS — Z1239 Encounter for other screening for malignant neoplasm of breast: Secondary | ICD-10-CM

## 2018-05-06 DIAGNOSIS — Z0000 Encounter for general adult medical examination without abnormal findings: Secondary | ICD-10-CM | POA: Diagnosis not present

## 2018-05-06 DIAGNOSIS — E2839 Other primary ovarian failure: Secondary | ICD-10-CM | POA: Diagnosis not present

## 2018-05-06 NOTE — Addendum Note (Signed)
Addended by: Vonna Kotyk A on: 05/06/2018 03:40 PM   Modules accepted: Orders

## 2018-05-06 NOTE — Progress Notes (Signed)
Subjective:   Erin Wong is a 67 y.o. female who presents for Medicare Annual (Subsequent) preventive examination.       Objective:     Vitals: BP (!) 142/82   Pulse 74   Temp 97.8 F (36.6 C) (Oral)   Resp 14   Ht 5\' 2"  (1.575 m)   Wt 68.5 kg   SpO2 97%   BMI 27.62 kg/m   Body mass index is 27.62 kg/m.  Advanced Directives 05/06/2018 01/13/2017 04/23/2015  Does Patient Have a Medical Advance Directive? No Yes No  Type of Advance Directive - Living will -  Does patient want to make changes to medical advance directive? - No - Patient declined -  Would patient like information on creating a medical advance directive? Yes - Scientist, clinical (histocompatibility and immunogenetics) given today for  Advance Directives, [place], Living Will  - Yes - Educational materials given    Tobacco Social History   Tobacco Use  Smoking Status Former Smoker  . Last attempt to quit: 01/10/1991  . Years since quitting: 27.3  Smokeless Tobacco Never Used       Past Medical History:  Diagnosis Date  . Allergy   . Arthritis   . Asthma    Past Surgical History:  Procedure Laterality Date  . ABDOMINAL HYSTERECTOMY  10/04/1995   Hysterectomy and Bilateral oophorectomy  . COLON SURGERY  09/2009   hole in colon  . FRACTURE SURGERY Right 08/02/2011   Right Leg--Plates & Screws  . 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   at time of colon surgery   Family History  Problem Relation Age of Onset  . Arthritis Mother   . Cancer Mother        Lung Cancer--Had quit smoking for 25 years  . Miscarriages / [place] Mother   . Alcohol abuse Father   . Alcohol abuse Brother   . COPD Brother   . Asthma Maternal Aunt   . Diabetes Maternal Aunt    Social History   Socioeconomic History  . Marital status: Married    Spouse name: Not on file  . Number of children: Not on file  . Years of education: Not on file  . Highest  education level: Not on file  Occupational History  . Not on file  Social Needs  . Financial resource strain: Not on file  . Food insecurity:    Worry: Not on file    Inability: Not on file  . Transportation needs:    Medical: Not on file    Non-medical: Not on file  Tobacco Use  . Smoking status: Former Smoker    Last attempt to quit: 01/10/1991    Years since quitting: 27.3  . Smokeless tobacco: Never Used  Substance and Sexual Activity  . Alcohol use: No  . Drug use: No  . Sexual activity: Never  Lifestyle  . Physical activity:    Days per week: Not on file    Minutes per session: Not on file  . Stress: Not on file  Relationships  . Social connections:    Talks on phone: Not on file    Gets together: Not on file    Attends religious service: Not on file    Active member of club or organization: Not on file    Attends meetings of clubs or organizations: Not on file    Relationship status: Not on file  Other  Topics Concern  . Not on file  Social History Narrative  . Not on file    Outpatient Encounter Medications as of 05/06/2018  Medication Sig  . acetaminophen (TYLENOL) 650 MG CR tablet Take 2 tablets (1,300 mg total) by mouth 2 (two) times daily as needed for pain.  Marland Kitchen albuterol (PROVENTIL HFA;VENTOLIN HFA) 108 (90 Base) MCG/ACT inhaler Inhale 1 puff into the lungs every 6 (six) hours as needed for wheezing or shortness of breath.  Marland Kitchen atorvastatin (LIPITOR) 10 MG tablet TAKE 1 TABLET (10 MG TOTAL) BY MOUTH DAILY AT 6 PM.  . benazepril (LOTENSIN) 20 MG tablet TAKE 1 TABLET BY MOUTH EVERY DAY  . Calcium 500 MG tablet Take 1 tablet (500 mg total) by mouth 3 (three) times daily. (Patient taking differently: Take 500 mg by mouth daily. )  . Carbamide Peroxide (EAR WAX DROPS OT) Place 2 drops into the right ear daily as needed (ear wax removal).  . cholecalciferol (VITAMIN D) 1000 units tablet Take 1 tablet (1,000 Units total) by mouth daily.  . fluticasone (FLONASE) 50  MCG/ACT nasal spray Place 2 sprays into both nostrils daily. (Patient taking differently: Place 2 sprays into both nostrils daily as needed for allergies or rhinitis. )  . Fluticasone-Salmeterol (ADVAIR DISKUS) 100-50 MCG/DOSE AEPB TAKE 1 PUFF BY MOUTH TWICE A DAY  . ibandronate (BONIVA) 150 MG tablet Take 1 tablet (150 mg total) by mouth every 30 (thirty) days.  . polyethylene glycol powder (GLYCOLAX/MIRALAX) powder Take 255 g by mouth 1 day or 1 dose. (Patient taking differently: Take 17 g by mouth daily. )  . ranitidine (ZANTAC) 300 MG tablet TAKE 1 TABLET BY MOUTH EVERYDAY AT BEDTIME  . traMADol (ULTRAM) 50 MG tablet TAKE 2 TABLETS 3 TIMES A DAY  . [DISCONTINUED] azithromycin (ZITHROMAX) 250 MG tablet Day 1: Take 2 daily. Days 2 -5: Take 1 daily.  . [DISCONTINUED] predniSONE (DELTASONE) 20 MG tablet Take 1 tablet (20 mg total) by mouth daily with breakfast.   No facility-administered encounter medications on file as of 05/06/2018.     Activities of Daily Living In your present state of health, do you have any difficulty performing the following activities: 05/06/2018  Hearing? Y  Vision? Y  Difficulty concentrating or making decisions? Y  Walking or climbing stairs? Y  Dressing or bathing? N  Doing errands, shopping? N  Preparing Food and eating ? N  Using the Toilet? N  In the past six months, have you accidently leaked urine? Y  Do you have problems with loss of bowel control? Y  Managing your Medications? N  Managing your Finances? N  Housekeeping or managing your Housekeeping? N  Some recent data might be hidden   Regarding the vision----he states that she has no problems with far vision.  States that she only has problems reading very small fine print.  States that if she uses reading glasses then she can even read the small print.  No other vision problems.  She  is able to hear me without difficulty during usual normal conversation and talking during visit.  She has problem  walking and climbing stairs secondary to her right knee. We discussed this at her visit with me 01/2017.  At that time she reported she was having pain in her right knee.  Prior to that she had had 2 injections to the knee by Dr. Dennard Schaumann.  Discussed having her see orthopedics.  She stated that she has seen orthopedic located on [address]  and they had told her "there was nothing they could do because she already had so much titanium etc. in her knee.  "At visit her husband noted that Dr. Jewel Baize had mentioned having her see another specialist about her knee.  Offered referral to another specialist.  For at that visit her husband said that finances were extremely tight and that they did not have finances for that and they both agreed that they would not have any type of procedure unless absolutely necessary.  She reports that if they go to [facility] or somewhere like that that involves a lot of walking that she uses her walker.  She is also requesting handicap placard today.  Today I also discussed the comment above regarding accidentally leaking urine.  States that this only happens if she lifts something heavy or walks fast.  States that that has happened for years.  States that she does not leak urine any other times.  Does not leak urine if she coughs sneezes or laughs.  Discussed that there are medications available that may help with this.  She does not want medication for this.  States that she just avoids lifting anything heavy or walking fast.  Also discussed the issue she has above regarding problems with loss of bowel control.  She states that since she had colon surgery that some things she eats will cause loose stool.  He knows not to consume any type of milk or cheese if she is going to be going somewhere.  Otherwise this is not an issue.    Patient Care Team: Rennis Golden as PCP - General (Physician Assistant)    Assessment:   This is a routine wellness examination for  [place].  Exercise Activities and Dietary recommendations Current Exercise Habits: Home exercise routine, Type of exercise: Other - see comments(leg lifts), Time (Minutes): 10, Frequency (Times/Week): 7, Weekly Exercise (Minutes/Week): 70, Intensity: Mild, Exercise limited by: orthopedic condition(s)    Fall Risk Fall Risk  05/06/2018 02/18/2018 06/05/2017 08/01/2016 07/17/2015  Falls in the past year? No No No No No   Is the patient's home free of loose throw rugs in walkways, pet beds, electrical cords, etc?   yes      Grab bars in the bathroom? yes      Handrails on the stairs?   yes      Adequate lighting?   yes   Depression Screen PHQ 2/9 Scores 05/06/2018 02/18/2018 08/14/2017 06/05/2017  PHQ - 2 Score 0 0 0 0     Cognitive Function     6CIT Screen 05/06/2018  What Year? 0 points  What month? 0 points  What time? 0 points  Count back from 20 0 points  Months in reverse 2 points  Repeat phrase 2 points  Total Score 4    Immunization History  Administered Date(s) Administered  . Hepatitis B 11/21/2004, 12/19/2004, 08/13/2005  . Influenza, High Dose Seasonal PF 06/06/2016, 06/03/2017  . Influenza,inj,Quad PF,6+ Mos 07/17/2015  . Pneumococcal Polysaccharide-23 08/01/2016  . Tdap 04/23/2015   Discussed giving Prevnar 13 today.  She is agreeable.  Prevnar 13 given here 05/06/2018. Today I discussed Shingrix.  However she defers having this vaccine.   Screening Tests Health Maintenance  Topic Date Due  . COLONOSCOPY  03/29/2000  . PNA vac Low Risk Adult (2 of 2 - PCV13) 08/01/2017  . MAMMOGRAM  10/12/2017  . INFLUENZA VACCINE  04/02/2018  . TETANUS/TDAP  04/22/2025  . DEXA SCAN  Completed  .  Hepatitis C Screening  Completed    Cancer Screenings: Breast:  Up to date on Mammogram? No  Last Mammogram---11/2015---discussed this is over due. She is agreeable to go for mammogram Up to date of Bone Density/Dexa? No DEXA scan was performed 10/13/2015.  All of the information  regarding her DEXA scan is documented in her visit with me 08/01/2016.  See that note for all of that information.  Today I will schedule DEXA scan. Colorectal: She had colon surgery 2011.  Was told at that time that "her cold and looked good, normal."  No colonoscopy since then.      Plan:      I have personally reviewed and noted the following in the patient's chart:   . Medical and social history . Use of alcohol, tobacco or illicit drugs  . Current medications and supplements . Functional ability and status . Nutritional status . Physical activity . Advanced directives . List of other physicians . Hospitalizations, surgeries, and ER visits in previous 12 months . Vitals . Screenings to include cognitive, depression, and falls . Referrals and appointments  In addition, I have reviewed and discussed with patient certain preventive protocols, quality metrics, and best practice recommendations. A written personalized care plan for preventive services as well as general preventive health recommendations were provided to patient.   1. Medicare annual wellness visit, subsequent - MM DIGITAL SCREENING BILATERAL; Future - DG BONE DENSITY (DXA); Future  2. Osteoporosis without current pathological fracture, unspecified osteoporosis type - DG BONE DENSITY (DXA); Future  3. Postmenopausal - DG BONE DENSITY (DXA); Future  4. Estrogen deficiency - DG BONE DENSITY (DXA); Future  5. Breast cancer screening - MM DIGITAL SCREENING BILATERAL; Future    [facility], [place]  05/06/2018

## 2018-05-12 ENCOUNTER — Encounter: Payer: Self-pay | Admitting: Family Medicine

## 2018-05-12 ENCOUNTER — Ambulatory Visit (INDEPENDENT_AMBULATORY_CARE_PROVIDER_SITE_OTHER): Payer: Medicare Other | Admitting: Family Medicine

## 2018-05-12 VITALS — BP 130/80 | HR 93 | Temp 98.0°F | Resp 16 | Ht 64.0 in | Wt 151.0 lb

## 2018-05-12 DIAGNOSIS — M25561 Pain in right knee: Secondary | ICD-10-CM | POA: Diagnosis not present

## 2018-05-12 DIAGNOSIS — G8929 Other chronic pain: Secondary | ICD-10-CM | POA: Diagnosis not present

## 2018-05-12 NOTE — Progress Notes (Signed)
Subjective:    Patient ID: Erin Wong, female    DOB: 11/28/1950, 67 y.o.   MRN: [id number]  HPI10/18 Patient presents today with right knee pain. She has a previous history of a fracture in her lower leg status post ORIF of the tibia and fibula. Afterwards she has had chronic right knee pain that waxes and wanes. She is requesting a cortisone injection in her right knee. She is receiving this from time to time and has received substantial pain relief from these. I injected it in November of last year as well as in March of this year .  Pain seemed to be triggered by shopping this weekend. She was on her feet for more than 8 hours a day 3 days straight. Prolonged periods of walking and standing seem to exacerbate her knee pain. Pain is located anteriorly in the medial and lateral compartments. There is a slight effusion on exam today. There is no laxity to varus or valgus stress. At that time, my plan was: I believe the patient has right knee pain secondary to osteoarthritis. We discussed the risk of septic arthritis secondary to a cortisone shot and the patient wishes to proceed. Using sterile technique, I injected the right knee using a medial approach with 2 mL of lidocaine, 2 mL of Marcaine, and 2 mL of 40 mg per mL Kenalog. The patient tolerated the procedure well without complication  10/09/76 Patient states previous injection worked well from October all the way until February however it gradually wore off in February and she has been experiencing pain in the medial and the lateral joint lines of the right knee ever since.  Primarily located over the lateral joint line is majority of her pain.  She is requesting a repeat cortisone injection today.  However she is also having pain radiate from her posterior right hip down the back of her right leg past the knee across the dorsum of the right shin into her foot with some numbness and tingling in the toes.  This sounds more consistent with sciatica.   At that time, my plan was: Using sterile technique, the right knee was injected with 2 cc lidocaine, 2 cc of Marcaine, and 2 cc of 40 mg/mL Kenalog.  Patient tolerated the procedure well without complication.  If she continues to experience pain radiating from her right hip down the right leg, I have ordered an x-ray of her lumbar spine to evaluate further.  She will notify me if the pain persists.     05/12/18 Patient states that her right knee is hurting again.  The pain is primarily located over the medial joint line.  She is tender to palpation over the medial joint line.  She has decreased flexion and extension due to pain.  She has pain with ambulation.  She also has a mild effusion.  She is requesting a repeat cortisone injection however she would also like a referral to see an orthopedic surgeon to discuss other options as she realizes the cortisone injection is simply masking the pain and not correcting the underlying problem.   Past Medical History:  Diagnosis Date  . Allergy   . Arthritis   . Asthma    Past Surgical History:  Procedure Laterality Date  . ABDOMINAL HYSTERECTOMY  10/04/1995   Hysterectomy and Bilateral oophorectomy  . COLON SURGERY  09/2009   hole in colon  . FRACTURE SURGERY Right 08/02/2011   Right Leg--Plates & Screws  . HAND SURGERY Right  09/02/2006   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--Arthritis  . 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   at time of colon surgery   Current Outpatient Medications on File Prior to Visit  Medication Sig Dispense Refill  . acetaminophen (TYLENOL) 650 MG CR tablet Take 2 tablets (1,300 mg total) by mouth 2 (two) times daily as needed for pain. 120 tablet 3  . albuterol (PROVENTIL HFA;VENTOLIN HFA) 108 (90 Base) MCG/ACT inhaler Inhale 1 puff into the lungs every 6 (six) hours as needed for wheezing or shortness of breath. 18 g 3  . atorvastatin (LIPITOR) 10 MG tablet TAKE 1 TABLET (10 MG TOTAL) BY MOUTH DAILY AT 6 PM. 90 tablet 1  . benazepril  (LOTENSIN) 20 MG tablet TAKE 1 TABLET BY MOUTH EVERY DAY 90 tablet 0  . Calcium 500 MG tablet Take 1 tablet (500 mg total) by mouth 3 (three) times daily. (Patient taking differently: Take 500 mg by mouth daily. ) 90 tablet   . Carbamide Peroxide (EAR WAX DROPS OT) Place 2 drops into the right ear daily as needed (ear wax removal).    . cholecalciferol (VITAMIN D) 1000 units tablet Take 1 tablet (1,000 Units total) by mouth daily. 90 tablet 1  . fluticasone (FLONASE) 50 MCG/ACT nasal spray Place 2 sprays into both nostrils daily. (Patient taking differently: Place 2 sprays into both nostrils daily as needed for allergies or rhinitis. ) 16 g 11  . Fluticasone-Salmeterol (ADVAIR DISKUS) 100-50 MCG/DOSE AEPB TAKE 1 PUFF BY MOUTH TWICE A DAY 180 each 1  . ibandronate (BONIVA) 150 MG tablet Take 1 tablet (150 mg total) by mouth every 30 (thirty) days. 1 tablet 11  . polyethylene glycol powder (GLYCOLAX/MIRALAX) powder Take 255 g by mouth 1 day or 1 dose. (Patient taking differently: Take 17 g by mouth daily. ) 255 g 5  . ranitidine (ZANTAC) 300 MG tablet TAKE 1 TABLET BY MOUTH EVERYDAY AT BEDTIME 90 tablet 1  . traMADol (ULTRAM) 50 MG tablet TAKE 2 TABLETS 3 TIMES A DAY 180 tablet 2   No current facility-administered medications on file prior to visit.    No Known Allergies Social History   Socioeconomic History  . Marital status: Married    Spouse name: Not on file  . Number of children: Not on file  . Years of education: Not on file  . Highest education level: Not on file  Occupational History  . Not on file  Social Needs  . Financial resource strain: Not on file  . Food insecurity:    Worry: Not on file    Inability: Not on file  . Transportation needs:    Medical: Not on file    Non-medical: Not on file  Tobacco Use  . Smoking status: Former Smoker    Last attempt to quit: 01/10/1991    Years since quitting: 27.3  . Smokeless tobacco: Never Used  Substance and Sexual Activity  .  Alcohol use: No  . Drug use: No  . Sexual activity: Never  Lifestyle  . Physical activity:    Days per week: Not on file    Minutes per session: Not on file  . Stress: Not on file  Relationships  . Social connections:    Talks on phone: Not on file    Gets together: Not on file    Attends religious service: Not on file    Active member of club or organization: Not on file    Attends meetings of clubs or organizations: Not on  file    Relationship status: Not on file  . Intimate partner violence:    Fear of current or ex partner: Not on file    Emotionally abused: Not on file    Physically abused: Not on file    Forced sexual activity: Not on file  Other Topics Concern  . Not on file  Social History Narrative  . Not on file     Review of Systems  All other systems reviewed and are negative.      Objective:   Physical Exam  Constitutional: She appears well-developed and well-nourished.  Cardiovascular: Normal rate, regular rhythm and normal heart sounds. Exam reveals no gallop and no friction rub.  No murmur heard. Pulmonary/Chest: Effort normal and breath sounds normal. No respiratory distress. She has no wheezes. She has no rales.  Abdominal: Soft. Bowel sounds are normal. She exhibits no distension and no mass. There is no tenderness. There is no rebound and no guarding.  Musculoskeletal:       Right knee: She exhibits decreased range of motion and effusion. Tenderness found. Medial joint line and lateral joint line tenderness noted.  Vitals reviewed.         Assessment & Plan:  Chronic pain of right knee - Plan: DG Knee Complete 4 Views Right, Ambulatory referral to Orthopedic Surgery I have recommended an x-ray of the right knee to verify that this is in fact osteoarthritis.  I still believe that based on her exam that is the most likely explanation.  I will consult orthopedic surgery to get a second opinion.  Following the patient's request, I also injected her  right knee today with cortisone.  Using sterile technique, the right knee was injected with 2 cc lidocaine, 2 cc of Marcaine, and 2 cc of 40 mg/mL Kenalog.  Patient tolerated the procedure well without complication.  Await the results of the x-ray and if osteoarthritis is confirmed, consult orthopedic surgery.

## 2018-06-10 ENCOUNTER — Other Ambulatory Visit: Payer: Self-pay | Admitting: Physician Assistant

## 2018-06-10 NOTE — Telephone Encounter (Signed)
Last OV 05/12/2018 Last refill 02/18/2018 Ok to refill?

## 2018-07-13 ENCOUNTER — Ambulatory Visit
Admission: RE | Admit: 2018-07-13 | Discharge: 2018-07-13 | Disposition: A | Discharge status: Home / Self Care | Payer: Medicare Other | Source: Ambulatory Visit | Attending: Physician Assistant | Admitting: Physician Assistant

## 2018-07-13 DIAGNOSIS — Z1239 Encounter for other screening for malignant neoplasm of breast: Secondary | ICD-10-CM

## 2018-07-13 DIAGNOSIS — Z780 Asymptomatic menopausal state: Secondary | ICD-10-CM

## 2018-07-13 DIAGNOSIS — M810 Age-related osteoporosis without current pathological fracture: Secondary | ICD-10-CM

## 2018-07-13 DIAGNOSIS — M8588 Other specified disorders of bone density and structure, other site: Secondary | ICD-10-CM | POA: Diagnosis not present

## 2018-07-13 DIAGNOSIS — Z0000 Encounter for general adult medical examination without abnormal findings: Secondary | ICD-10-CM

## 2018-07-13 DIAGNOSIS — E2839 Other primary ovarian failure: Secondary | ICD-10-CM

## 2018-07-13 DIAGNOSIS — Z1231 Encounter for screening mammogram for malignant neoplasm of breast: Secondary | ICD-10-CM | POA: Diagnosis not present

## 2018-07-16 ENCOUNTER — Encounter: Payer: Self-pay | Admitting: *Deleted

## 2018-07-27 ENCOUNTER — Other Ambulatory Visit: Payer: Self-pay | Admitting: Family Medicine

## 2018-07-27 DIAGNOSIS — I10 Essential (primary) hypertension: Secondary | ICD-10-CM

## 2018-07-27 MED ORDER — ATORVASTATIN CALCIUM 10 MG PO TABS: 10.0000 mg | ORAL_TABLET | Freq: Every day | ORAL | 1 refills | Status: DC

## 2018-08-17 ENCOUNTER — Ambulatory Visit (INDEPENDENT_AMBULATORY_CARE_PROVIDER_SITE_OTHER): Payer: Medicare Other | Admitting: Family Medicine

## 2018-08-17 ENCOUNTER — Encounter: Payer: Self-pay | Admitting: Family Medicine

## 2018-08-17 VITALS — BP 132/70 | HR 95 | Temp 97.8°F | Resp 18 | Ht 64.0 in | Wt 150.0 lb

## 2018-08-17 DIAGNOSIS — I209 Angina pectoris, unspecified: Secondary | ICD-10-CM

## 2018-08-17 DIAGNOSIS — Z23 Encounter for immunization: Secondary | ICD-10-CM | POA: Diagnosis not present

## 2018-08-17 MED ORDER — ISOSORBIDE MONONITRATE ER 30 MG PO TB24: 30.0000 mg | ORAL_TABLET | Freq: Every day | ORAL | 6 refills | Status: DC

## 2018-08-17 NOTE — Progress Notes (Signed)
Subjective:    Patient ID: Erin Wong, female    DOB: July 08, 1951, 67 y.o.   MRN: [id number]  HPI Patient was here today for scheduled follow-up however she reports increasing shortness of breath and dyspnea on exertion.  She also reports a chest pressure with activity.  She describes it independently as an elephant on her chest whenever she does moderate to strenuous activity.  She has to stop and rest and the pain will slowly subside.  She was admitted to the hospital in May of last year and underwent a stress test which was considered a low risk study.  Apparently she has nitroglycerin paste.  She will applied nitroglycerin paste whenever she feels this chest pressure and it will improve.  It is only related with exertion.  However she has noticed worsening dyspnea on exertion independent of the chest pressure.  This is been going on for the last 6 months however recently it has intensified and is now occurring every 2 to 3 days.  EKG today shows normal sinus rhythm with no ischemic changes Past Medical History:  Diagnosis Date  . Allergy   . Arthritis   . Asthma    Past Surgical History:  Procedure Laterality Date  . ABDOMINAL HYSTERECTOMY  10/04/1995   Hysterectomy and Bilateral oophorectomy  . COLON SURGERY  09/2009   hole in colon  . FRACTURE SURGERY Right 08/02/2011   Right Leg--Plates & Screws  . 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   at time of colon surgery   Current Outpatient Medications on File Prior to Visit  Medication Sig Dispense Refill  . acetaminophen (TYLENOL) 650 MG CR tablet Take 2 tablets (1,300 mg total) by mouth 2 (two) times daily as needed for pain. 120 tablet 3  . albuterol (PROVENTIL HFA;VENTOLIN HFA) 108 (90 Base) MCG/ACT inhaler Inhale 1 puff into the lungs every 6 (six) hours as needed for wheezing or shortness of breath. 18 g 3  . atorvastatin (LIPITOR) 10 MG tablet Take 1 tablet (10 mg  total) by mouth daily at 6 PM. 90 tablet 1  . benazepril (LOTENSIN) 20 MG tablet TAKE 1 TABLET BY MOUTH EVERY DAY 90 tablet 3  . Calcium 500 MG tablet Take 1 tablet (500 mg total) by mouth 3 (three) times daily. (Patient taking differently: Take 500 mg by mouth daily. ) 90 tablet   . Carbamide Peroxide (EAR WAX DROPS OT) Place 2 drops into the right ear daily as needed (ear wax removal).    . cholecalciferol (VITAMIN D) 1000 units tablet Take 1 tablet (1,000 Units total) by mouth daily. 90 tablet 1  . fluticasone (FLONASE) 50 MCG/ACT nasal spray Place 2 sprays into both nostrils daily. (Patient taking differently: Place 2 sprays into both nostrils daily as needed for allergies or rhinitis. ) 16 g 11  . Fluticasone-Salmeterol (ADVAIR DISKUS) 100-50 MCG/DOSE AEPB TAKE 1 PUFF BY MOUTH TWICE A DAY 180 each 1  . polyethylene glycol powder (GLYCOLAX/MIRALAX) powder Take 255 g by mouth 1 day or 1 dose. (Patient taking differently: Take 17 g by mouth daily. ) 255 g 5  . ranitidine (ZANTAC) 300 MG tablet TAKE 1 TABLET BY MOUTH EVERYDAY AT BEDTIME 90 tablet 1  . traMADol (ULTRAM) 50 MG tablet TAKE 2 TABLETS BY MOUTH 3 TIMES A DAY AS NEEDED FOR PAIN 180 tablet 2   No current facility-administered medications on file prior to visit.    No  Known Allergies Social History   Socioeconomic History  . Marital status: Married    Spouse name: Not on file  . Number of children: Not on file  . Years of education: Not on file  . Highest education level: Not on file  Occupational History  . Not on file  Social Needs  . Financial resource strain: Not on file  . Food insecurity:    Worry: Not on file    Inability: Not on file  . Transportation needs:    Medical: Not on file    Non-medical: Not on file  Tobacco Use  . Smoking status: Former Smoker    Last attempt to quit: 01/10/1991    Years since quitting: 27.6  . Smokeless tobacco: Never Used  Substance and Sexual Activity  . Alcohol use: No  . Drug  use: No  . Sexual activity: Never  Lifestyle  . Physical activity:    Days per week: Not on file    Minutes per session: Not on file  . Stress: Not on file  Relationships  . Social connections:    Talks on phone: Not on file    Gets together: Not on file    Attends religious service: Not on file    Active member of club or organization: Not on file    Attends meetings of clubs or organizations: Not on file    Relationship status: Not on file  . Intimate partner violence:    Fear of current or ex partner: Not on file    Emotionally abused: Not on file    Physically abused: Not on file    Forced sexual activity: Not on file  Other Topics Concern  . Not on file  Social History Narrative  . Not on file     Review of Systems  All other systems reviewed and are negative.      Objective:   Physical Exam  Constitutional: She appears well-developed and well-nourished.  Cardiovascular: Normal rate, regular rhythm and normal heart sounds. Exam reveals no gallop and no friction rub.  No murmur heard. Pulmonary/Chest: Effort normal and breath sounds normal. No respiratory distress. She has no wheezes. She has no rales.  Abdominal: Soft. Bowel sounds are normal. She exhibits no distension and no mass. There is no abdominal tenderness. There is no rebound and no guarding.  Vitals reviewed.         Assessment & Plan:  Angina pectoris ([place]) - Plan: CBC with Differential/Platelet, COMPLETE METABOLIC PANEL WITH GFR, Lipid panel, EKG 12-Lead, Ambulatory referral to Cardiology  Patient's chest pain sounds classic for angina.  I have recommended trying her on Imdur 30 mg p.o. every morning to see if this will help with her chest pain and her dyspnea on exertion.  Meanwhile I would recommend cardiology consultation.  I will also obtain a CBC, CMP, fasting lipid panel.  I would like to try to drive her LDL cholesterol well below 70 given her symptoms.  Blood pressure today is acceptable at  132/70.  Hesitates start a beta-blocker today due to her moderate to severe asthma.  Therefore I will start Imdur as discussed above.  I will likely increase her Lipitor to 80 mg a day and also recommend aspirin 81 mg a day based on the results of her lab work.  Schedule with cardiology as discussed.

## 2018-08-18 LAB — CBC WITH DIFFERENTIAL/PLATELET
ABSOLUTE MONOCYTES: 1176 {cells}/uL — AB (ref 200–950)
Basophils Absolute: 108 cells/uL (ref 0–200)
Basophils Relative: 0.9 %
EOS ABS: 144 {cells}/uL (ref 15–500)
Eosinophils Relative: 1.2 %
HCT: 38.2 % (ref 35.0–45.0)
Hemoglobin: 13 g/dL (ref 11.7–15.5)
LYMPHS ABS: 2328 {cells}/uL (ref 850–3900)
MCH: 33.7 pg — AB (ref 27.0–33.0)
MCHC: 34 g/dL (ref 32.0–36.0)
MCV: 99 fL (ref 80.0–100.0)
MPV: 10.6 fL (ref 7.5–12.5)
Monocytes Relative: 9.8 %
Neutro Abs: 8244 cells/uL — ABNORMAL HIGH (ref 1500–7800)
Neutrophils Relative %: 68.7 %
PLATELETS: 490 10*3/uL — AB (ref 140–400)
RBC: 3.86 10*6/uL (ref 3.80–5.10)
RDW: 11.4 % (ref 11.0–15.0)
Total Lymphocyte: 19.4 %
WBC: 12 10*3/uL — AB (ref 3.8–10.8)

## 2018-08-18 LAB — LIPID PANEL
Cholesterol: 226 mg/dL — ABNORMAL HIGH (ref <200)
HDL: 107 mg/dL (ref >50)
LDL Cholesterol (Calc): 95 mg/dL (calc)
Non-HDL Cholesterol (Calc): 119 mg/dL (calc) (ref <130)
TRIGLYCERIDES: 144 mg/dL (ref <150)
Total CHOL/HDL Ratio: 2.1 (calc) (ref <5.0)

## 2018-08-18 LAB — COMPLETE METABOLIC PANEL WITH GFR
AG RATIO: 1.5 (calc) (ref 1.0–2.5)
ALT: 19 U/L (ref 6–29)
AST: 23 U/L (ref 10–35)
Albumin: 4.2 g/dL (ref 3.6–5.1)
Alkaline phosphatase (APISO): 88 U/L (ref 33–130)
BUN/Creatinine Ratio: 20 (calc) (ref 6–22)
BUN: 20 mg/dL (ref 7–25)
CALCIUM: 9.9 mg/dL (ref 8.6–10.4)
CO2: 24 mmol/L (ref 20–32)
Chloride: 102 mmol/L (ref 98–110)
Creat: 1 mg/dL — ABNORMAL HIGH (ref 0.50–0.99)
GFR, EST AFRICAN AMERICAN: 67 mL/min/{1.73_m2} (ref >=60)
GFR, EST NON AFRICAN AMERICAN: 58 mL/min/{1.73_m2} — AB (ref >=60)
GLOBULIN: 2.8 g/dL (ref 1.9–3.7)
Glucose, Bld: 77 mg/dL (ref 65–99)
POTASSIUM: 5.6 mmol/L — AB (ref 3.5–5.3)
Sodium: 139 mmol/L (ref 135–146)
Total Bilirubin: 0.4 mg/dL (ref 0.2–1.2)
Total Protein: 7 g/dL (ref 6.1–8.1)

## 2018-08-19 ENCOUNTER — Ambulatory Visit: Payer: Medicare Other | Admitting: Physician Assistant

## 2018-08-28 ENCOUNTER — Other Ambulatory Visit: Payer: Self-pay | Admitting: Family Medicine

## 2018-08-28 MED ORDER — ATORVASTATIN CALCIUM 40 MG PO TABS: 40.0000 mg | ORAL_TABLET | Freq: Every day | ORAL | 1 refills | Status: DC

## 2018-09-11 ENCOUNTER — Other Ambulatory Visit: Payer: Self-pay | Admitting: Family Medicine

## 2018-09-14 ENCOUNTER — Other Ambulatory Visit: Payer: Self-pay | Admitting: Family Medicine

## 2018-09-14 MED ORDER — FLUTICASONE-SALMETEROL 100-50 MCG/DOSE IN AEPB: INHALATION_SPRAY | RESPIRATORY_TRACT | 5 refills | Status: DC

## 2018-10-21 ENCOUNTER — Other Ambulatory Visit: Payer: Self-pay | Admitting: Family Medicine

## 2018-10-21 NOTE — Telephone Encounter (Signed)
Ok to refill??  Last office visit 08/17/2018.  Last refill 06/10/2018, #2 refills.

## 2018-10-27 ENCOUNTER — Ambulatory Visit: Payer: Medicare Other | Admitting: Cardiovascular Disease

## 2018-10-29 ENCOUNTER — Other Ambulatory Visit: Payer: Self-pay

## 2018-10-29 MED ORDER — RANITIDINE HCL 300 MG PO TABS: ORAL_TABLET | ORAL | 1 refills | Status: DC

## 2018-11-30 ENCOUNTER — Telehealth: Payer: Self-pay | Admitting: Family Medicine

## 2018-11-30 NOTE — Telephone Encounter (Signed)
Pt states that her Advair has went from $5 to >$350 and would like to know if there is something else she can take that will be cheaper?

## 2018-12-01 NOTE — Telephone Encounter (Signed)
She can switch to either dulera or symbicort if either are cheaper but I imagine they will be similar.  This is likely her deductible that she will have to pay before it switches to a copay per month (I am guessing).

## 2018-12-02 NOTE — Telephone Encounter (Signed)
LMTRC

## 2018-12-04 NOTE — Telephone Encounter (Signed)
Pt called back and paid for it out of pocket

## 2019-02-15 ENCOUNTER — Telehealth: Payer: Self-pay | Admitting: Family Medicine

## 2019-02-15 MED ORDER — OMEPRAZOLE 20 MG PO CPDR: 20.0000 mg | DELAYED_RELEASE_CAPSULE | Freq: Every day | ORAL | 3 refills | Status: DC

## 2019-02-15 NOTE — Telephone Encounter (Signed)
prilosec 20 mg poqday

## 2019-02-15 NOTE — Telephone Encounter (Signed)
Pharm made aware

## 2019-02-15 NOTE — Telephone Encounter (Signed)
Pt needs replacement for Zantac. Please advise

## 2019-02-16 ENCOUNTER — Ambulatory Visit (INDEPENDENT_AMBULATORY_CARE_PROVIDER_SITE_OTHER): Payer: Medicare Other | Admitting: Family Medicine

## 2019-02-16 ENCOUNTER — Other Ambulatory Visit: Payer: Self-pay

## 2019-02-16 ENCOUNTER — Encounter: Payer: Self-pay | Admitting: Family Medicine

## 2019-02-16 VITALS — BP 130/70 | HR 72 | Temp 98.8°F | Resp 16 | Ht 64.0 in | Wt 149.0 lb

## 2019-02-16 DIAGNOSIS — I209 Angina pectoris, unspecified: Secondary | ICD-10-CM

## 2019-02-16 DIAGNOSIS — G8929 Other chronic pain: Secondary | ICD-10-CM | POA: Diagnosis not present

## 2019-02-16 DIAGNOSIS — M25561 Pain in right knee: Secondary | ICD-10-CM | POA: Diagnosis not present

## 2019-02-16 DIAGNOSIS — E785 Hyperlipidemia, unspecified: Secondary | ICD-10-CM

## 2019-02-16 DIAGNOSIS — I10 Essential (primary) hypertension: Secondary | ICD-10-CM | POA: Diagnosis not present

## 2019-02-16 NOTE — Progress Notes (Signed)
Subjective:    Patient ID: Erin Wong, female    DOB: 10-02-1950, 68 y.o.   MRN: [id number]  Medication Refill  08/17/18 Patient was here today for scheduled follow-up however she reports increasing shortness of breath and dyspnea on exertion.  She also reports a chestt pressure with activity.  She describes it independently as an elephant on her chest whenever she does moderate to strenuous activity.  She has to stop and rest and the pain will slowly subside.  She was admitted to the hospital in May of last year and underwent a stress test which was considered a low risk study.  Apparently she has nitroglycerin paste.  She will applied nitroglycerin paste whenever she feels this chest pressure and it will improve.  It is only related with exertion.  However she has noticed worsening dyspnea on exertion independent of the chest pressure.  This is been going on for the last 6 months however recently it has intensified and is now occurring every 2 to 3 days.  EKG today shows normal sinus rhythm with no ischemic changes.  At that time, my plan was: Patient's chest pain sounds classic for angina.  I have recommended trying her on Imdur 30 mg p.o. every morning to see if this will help with her chest pain and her dyspnea on exertion.  Meanwhile I would recommend cardiology consultation.  I will also obtain a CBC, CMP, fasting lipid panel.  I would like to try to drive her LDL cholesterol well below 70 given her symptoms.  Blood pressure today is acceptable at 132/70.  Hesitates start a beta-blocker today due to her moderate to severe asthma.  Therefore I will start Imdur as discussed above.  I will likely increase her Lipitor to 80 mg a day and also recommend aspirin 81 mg a day based on the results of her lab work.  Schedule with cardiology as discussed.  02/16/19 Patient never saw cardiology.  Due to the COVID-19 pandemic, her appointment got canceled and was never rescheduled.  Since that time she has  been consistently taking her isosorbide mononitrate and has had no further chest pain.  She states that she feels like she can do whatever she wants to without chest pressure or shortness of breath.  This makes me question my original diagnosis of angina.  Also question if she really needs the isosorbide given the fact she is having no further chest pain and no dyspnea on exertion.  Her blood pressure today is adequately controlled at 130/70.  She denies any myalgias or right upper quadrant pain.  She is due to recheck her fasting lipid panel.  Unfortunately she reports right knee pain.  She is had chronic knee pain due to osteoarthritis in that right knee.  She now reports pain with standing and walking.  She reports pain rising from a seated position.  She has an antalgic gait and can bear very little weight on her knee without significant stabbing pain in both the medial and lateral joint lines.  She is seeing an orthopedist but they are waiting for old records from her previous orthopedist Past Medical History:  Diagnosis Date  . Allergy   . Arthritis   . Asthma    Past Surgical History:  Procedure Laterality Date  . ABDOMINAL HYSTERECTOMY  10/04/1995   Hysterectomy and Bilateral oophorectomy  . COLON SURGERY  09/2009   hole in colon  . FRACTURE SURGERY Right 08/02/2011   Right Leg--Plates & Screws  .  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   at time of colon surgery   Current Outpatient Medications on File Prior to Visit  Medication Sig Dispense Refill  . acetaminophen (TYLENOL) 650 MG CR tablet Take 2 tablets (1,300 mg total) by mouth 2 (two) times daily as needed for pain. 120 tablet 3  . albuterol (PROVENTIL HFA;VENTOLIN HFA) 108 (90 Base) MCG/ACT inhaler Inhale 1 puff into the lungs every 6 (six) hours as needed for wheezing or shortness of breath. 18 g 3  . atorvastatin (LIPITOR) 40 MG tablet Take 1 tablet (40 mg total) by  mouth daily. 90 tablet 1  . benazepril (LOTENSIN) 20 MG tablet TAKE 1 TABLET BY MOUTH EVERY DAY 90 tablet 3  . Calcium 500 MG tablet Take 1 tablet (500 mg total) by mouth 3 (three) times daily. (Patient taking differently: Take 500 mg by mouth daily. ) 90 tablet   . Carbamide Peroxide (EAR WAX DROPS OT) Place 2 drops into the right ear daily as needed (ear wax removal).    . cholecalciferol (VITAMIN D) 1000 units tablet Take 1 tablet (1,000 Units total) by mouth daily. 90 tablet 1  . fluticasone (FLONASE) 50 MCG/ACT nasal spray Place 2 sprays into both nostrils daily. (Patient taking differently: Place 2 sprays into both nostrils daily as needed for allergies or rhinitis. ) 16 g 11  . Fluticasone-Salmeterol (ADVAIR DISKUS) 100-50 MCG/DOSE AEPB TAKE 1 PUFF BY MOUTH TWICE A DAY 60 each 5  . isosorbide mononitrate (IMDUR) 30 MG 24 hr tablet Take 1 tablet (30 mg total) by mouth daily. 30 tablet 6  . omeprazole (PRILOSEC) 20 MG capsule Take 1 capsule (20 mg total) by mouth daily. 90 capsule 3  . polyethylene glycol powder (GLYCOLAX/MIRALAX) powder Take 255 g by mouth 1 day or 1 dose. (Patient taking differently: Take 17 g by mouth daily. ) 255 g 5  . traMADol (ULTRAM) 50 MG tablet TAKE 2 TABLETS BY MOUTH 3 TIMES A DAY AS NEEDED FOR PAIN 180 tablet 2   No current facility-administered medications on file prior to visit.    No Known Allergies Social History   Socioeconomic History  . Marital status: Married    Spouse name: Not on file  . Number of children: Not on file  . Years of education: Not on file  . Highest education level: Not on file  Occupational History  . Not on file  Social Needs  . Financial resource strain: Not on file  . Food insecurity    Worry: Not on file    Inability: Not on file  . Transportation needs    Medical: Not on file    Non-medical: Not on file  Tobacco Use  . Smoking status: Former Smoker    Quit date: 01/10/1991    Years since quitting: 28.1  . Smokeless  tobacco: Never Used  Substance and Sexual Activity  . Alcohol use: No  . Drug use: No  . Sexual activity: Never  Lifestyle  . Physical activity    Days per week: Not on file    Minutes per session: Not on file  . Stress: Not on file  Relationships  . Social Herbalist on phone: Not on file    Gets together: Not on file    Attends religious service: Not on file    Active member of club or organization: Not on file    Attends meetings of  clubs or organizations: Not on file    Relationship status: Not on file  . Intimate partner violence    Fear of current or ex partner: Not on file    Emotionally abused: Not on file    Physically abused: Not on file    Forced sexual activity: Not on file  Other Topics Concern  . Not on file  Social History Narrative  . Not on file     Review of Systems  All other systems reviewed and are negative.      Objective:   Physical Exam  Constitutional: She appears well-developed and well-nourished.  Cardiovascular: Normal rate, regular rhythm and normal heart sounds. Exam reveals no gallop and no friction rub.  No murmur heard. Pulmonary/Chest: Effort normal and breath sounds normal. No respiratory distress. She has no wheezes. She has no rales.  Abdominal: Soft. Bowel sounds are normal. She exhibits no distension and no mass. There is no abdominal tenderness. There is no rebound and no guarding.  Musculoskeletal:     Right knee: She exhibits decreased range of motion and swelling. Tenderness found. Medial joint line and lateral joint line tenderness noted.  Vitals reviewed.         Assessment & Plan:  1. Angina pectoris ([place]) I believe my original diagnosis of angina is wrong given the fact her symptoms have completely resolved.  I would still recommend a stress test given her previous symptoms, her age, and her risk factors.  If her stress test is negative we can discontinue isosorbide.  For the time being I recommended that  she continue the medication until seen by cardiology - Ambulatory referral to Cardiology  2. Chronic pain of right knee Likely due to osteoarthritis.  Using sterile technique, I injected the right knee with 2 cc lidocaine, 2 cc of Marcaine, and 2 cc of 40 mg/mL Kenalog.  She tolerated the procedure well without complication  3. Hyperlipidemia, unspecified hyperlipidemia type Check fasting lipid panel.  Given previous diagnosis of angina, I would like her LDL cholesterol to be less than 70 - CBC with Differential/Platelet - COMPLETE METABOLIC PANEL WITH GFR - Lipid panel - Ambulatory referral to Cardiology  4. Essential hypertension Blood pressures well controlled today.  Make no changes at this time - CBC with Differential/Platelet - COMPLETE METABOLIC PANEL WITH GFR - Lipid panel - Ambulatory referral to Cardiology

## 2019-02-17 LAB — CBC WITH DIFFERENTIAL/PLATELET
Absolute Monocytes: 1174 cells/uL — ABNORMAL HIGH (ref 200–950)
Basophils Absolute: 136 cells/uL (ref 0–200)
Basophils Relative: 1.4 %
Eosinophils Absolute: 883 cells/uL — ABNORMAL HIGH (ref 15–500)
Eosinophils Relative: 9.1 %
HCT: 35.6 % (ref 35.0–45.0)
Hemoglobin: 11.9 g/dL (ref 11.7–15.5)
Lymphs Abs: 3201 cells/uL (ref 850–3900)
MCH: 33.4 pg — ABNORMAL HIGH (ref 27.0–33.0)
MCHC: 33.4 g/dL (ref 32.0–36.0)
MCV: 100 fL (ref 80.0–100.0)
MPV: 10.2 fL (ref 7.5–12.5)
Monocytes Relative: 12.1 %
Neutro Abs: 4307 cells/uL (ref 1500–7800)
Neutrophils Relative %: 44.4 %
Platelets: 476 10*3/uL — ABNORMAL HIGH (ref 140–400)
RBC: 3.56 10*6/uL — ABNORMAL LOW (ref 3.80–5.10)
RDW: 11.8 % (ref 11.0–15.0)
Total Lymphocyte: 33 %
WBC: 9.7 10*3/uL (ref 3.8–10.8)

## 2019-02-17 LAB — COMPLETE METABOLIC PANEL WITH GFR
AG Ratio: 1.6 (calc) (ref 1.0–2.5)
ALT: 17 U/L (ref 6–29)
AST: 14 U/L (ref 10–35)
Albumin: 4 g/dL (ref 3.6–5.1)
Alkaline phosphatase (APISO): 67 U/L (ref 37–153)
BUN: 16 mg/dL (ref 7–25)
CO2: 24 mmol/L (ref 20–32)
Calcium: 9.7 mg/dL (ref 8.6–10.4)
Chloride: 104 mmol/L (ref 98–110)
Creat: 0.94 mg/dL (ref 0.50–0.99)
GFR, Est African American: 72 mL/min/{1.73_m2} (ref >=60)
GFR, Est Non African American: 62 mL/min/{1.73_m2} (ref >=60)
Globulin: 2.5 g/dL (calc) (ref 1.9–3.7)
Glucose, Bld: 97 mg/dL (ref 65–99)
Potassium: 4.9 mmol/L (ref 3.5–5.3)
Sodium: 139 mmol/L (ref 135–146)
Total Bilirubin: 0.4 mg/dL (ref 0.2–1.2)
Total Protein: 6.5 g/dL (ref 6.1–8.1)

## 2019-02-17 LAB — LIPID PANEL
Cholesterol: 170 mg/dL (ref <200)
HDL: 77 mg/dL (ref >=50)
LDL Cholesterol (Calc): 68 mg/dL (calc)
Non-HDL Cholesterol (Calc): 93 mg/dL (calc) (ref <130)
Total CHOL/HDL Ratio: 2.2 (calc) (ref <5.0)
Triglycerides: 175 mg/dL — ABNORMAL HIGH (ref <150)

## 2019-02-19 ENCOUNTER — Other Ambulatory Visit: Payer: Self-pay | Admitting: Family Medicine

## 2019-02-28 ENCOUNTER — Other Ambulatory Visit: Payer: Self-pay | Admitting: Family Medicine

## 2019-03-05 ENCOUNTER — Other Ambulatory Visit: Payer: Self-pay | Admitting: Family Medicine

## 2019-03-08 NOTE — Telephone Encounter (Signed)
Ok to refill??  Last office visit 02/16/2019.  Last refill 10/22/2018.

## 2019-05-26 DIAGNOSIS — Z23 Encounter for immunization: Secondary | ICD-10-CM | POA: Diagnosis not present

## 2019-06-17 ENCOUNTER — Other Ambulatory Visit: Payer: Self-pay | Admitting: Family Medicine

## 2019-06-18 ENCOUNTER — Other Ambulatory Visit: Payer: Self-pay | Admitting: Family Medicine

## 2019-06-30 ENCOUNTER — Other Ambulatory Visit: Payer: Self-pay

## 2019-07-01 ENCOUNTER — Ambulatory Visit (INDEPENDENT_AMBULATORY_CARE_PROVIDER_SITE_OTHER): Payer: Medicare Other | Admitting: Family Medicine

## 2019-07-01 ENCOUNTER — Encounter: Payer: Self-pay | Admitting: Family Medicine

## 2019-07-01 VITALS — BP 140/80 | HR 88 | Temp 98.4°F | Resp 16 | Ht 64.0 in | Wt 152.0 lb

## 2019-07-01 DIAGNOSIS — I209 Angina pectoris, unspecified: Secondary | ICD-10-CM

## 2019-07-01 DIAGNOSIS — G8929 Other chronic pain: Secondary | ICD-10-CM

## 2019-07-01 DIAGNOSIS — M25561 Pain in right knee: Secondary | ICD-10-CM

## 2019-07-01 NOTE — Progress Notes (Signed)
Subjective:    Patient ID: Erin Wong, female    DOB: 10-23-1950, 68 y.o.   MRN: [id number]  Patient has chronic pain in her right knee.  She presents today with a small effusion.  The knee is also slightly warm to the touch.  She has pain with flexion and extension of her knee.  She has pain with standing and walking or rising from a seated position.  She has a history of osteoarthritis in the right knee and is requesting a cortisone injection.  Past Medical History:  Diagnosis Date  . Allergy   . Arthritis   . Asthma    Past Surgical History:  Procedure Laterality Date  . ABDOMINAL HYSTERECTOMY  10/04/1995   Hysterectomy and Bilateral oophorectomy  . COLON SURGERY  09/2009   hole in colon  . FRACTURE SURGERY Right 08/02/2011   Right Leg--Plates & Screws  . 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   at time of colon surgery   Current Outpatient Medications on File Prior to Visit  Medication Sig Dispense Refill  . acetaminophen (TYLENOL) 650 MG CR tablet Take 2 tablets (1,300 mg total) by mouth 2 (two) times daily as needed for pain. 120 tablet 3  . albuterol (PROVENTIL HFA;VENTOLIN HFA) 108 (90 Base) MCG/ACT inhaler Inhale 1 puff into the lungs every 6 (six) hours as needed for wheezing or shortness of breath. 18 g 3  . atorvastatin (LIPITOR) 40 MG tablet TAKE 1 TABLET BY MOUTH EVERY DAY 90 tablet 1  . benazepril (LOTENSIN) 20 MG tablet TAKE 1 TABLET BY MOUTH EVERY DAY 90 tablet 3  . Calcium 500 MG tablet Take 1 tablet (500 mg total) by mouth 3 (three) times daily. (Patient taking differently: Take 500 mg by mouth daily. ) 90 tablet   . Carbamide Peroxide (EAR WAX DROPS OT) Place 2 drops into the right ear daily as needed (ear wax removal).    . cholecalciferol (VITAMIN D) 1000 units tablet Take 1 tablet (1,000 Units total) by mouth daily. 90 tablet 1  . fluticasone (FLONASE) 50 MCG/ACT nasal spray Place 2 sprays into  both nostrils daily. (Patient taking differently: Place 2 sprays into both nostrils daily as needed for allergies or rhinitis. ) 16 g 11  . Fluticasone-Salmeterol (ADVAIR DISKUS) 100-50 MCG/DOSE AEPB INHALE 1 PUFF INTO THE LUNGS TWICE A DAY 60 each 5  . isosorbide mononitrate (IMDUR) 30 MG 24 hr tablet TAKE 1 TABLET BY MOUTH EVERY DAY 30 tablet 6  . omeprazole (PRILOSEC) 20 MG capsule Take 1 capsule (20 mg total) by mouth daily. 90 capsule 3  . polyethylene glycol powder (GLYCOLAX/MIRALAX) powder Take 255 g by mouth 1 day or 1 dose. (Patient taking differently: Take 17 g by mouth daily. ) 255 g 5  . traMADol (ULTRAM) 50 MG tablet TAKE 2 TABLETS BY MOUTH 3 TIMES A DAY AS NEEDED FOR PAIN 180 tablet 2   No current facility-administered medications on file prior to visit.    No Known Allergies Social History   Socioeconomic History  . Marital status: Married    Spouse name: Not on file  . Number of children: Not on file  . Years of education: Not on file  . Highest education level: Not on file  Occupational History  . Not on file  Social Needs  . Financial resource strain: Not on file  . Food insecurity    Worry: Not on file  Inability: Not on file  . Transportation needs    Medical: Not on file    Non-medical: Not on file  Tobacco Use  . Smoking status: Former Smoker    Quit date: 01/10/1991    Years since quitting: 28.4  . Smokeless tobacco: Never Used  Substance and Sexual Activity  . Alcohol use: No  . Drug use: No  . Sexual activity: Never  Lifestyle  . Physical activity    Days per week: Not on file    Minutes per session: Not on file  . Stress: Not on file  Relationships  . Social Herbalist on phone: Not on file    Gets together: Not on file    Attends religious service: Not on file    Active member of club or organization: Not on file    Attends meetings of clubs or organizations: Not on file    Relationship status: Not on file  . Intimate partner  violence    Fear of current or ex partner: Not on file    Emotionally abused: Not on file    Physically abused: Not on file    Forced sexual activity: Not on file  Other Topics Concern  . Not on file  Social History Narrative  . Not on file     Review of Systems  All other systems reviewed and are negative.      Objective:   Physical Exam  Constitutional: She appears well-developed and well-nourished.  Cardiovascular: Normal rate, regular rhythm and normal heart sounds. Exam reveals no gallop and no friction rub.  No murmur heard. Pulmonary/Chest: Effort normal and breath sounds normal. No respiratory distress. She has no wheezes. She has no rales.  Abdominal: Soft. Bowel sounds are normal. She exhibits no distension and no mass. There is no abdominal tenderness. There is no rebound and no guarding.  Musculoskeletal:     Right knee: She exhibits decreased range of motion and effusion. Tenderness found. Medial joint line and lateral joint line tenderness noted.  Vitals reviewed.         Assessment & Plan:  Chronic pain of right knee   Using sterile technique, the right knee was injected with 2 cc lidocaine, 2 cc of Marcaine, and 2 cc of 40 mg/mL Kenalog.  Patient tolerated the procedure well without complication.

## 2019-07-06 ENCOUNTER — Other Ambulatory Visit: Payer: Self-pay | Admitting: Family Medicine

## 2019-07-06 DIAGNOSIS — I10 Essential (primary) hypertension: Secondary | ICD-10-CM

## 2019-07-16 ENCOUNTER — Other Ambulatory Visit: Payer: Self-pay | Admitting: Family Medicine

## 2019-07-16 NOTE — Telephone Encounter (Signed)
Ok to refill??  Last office visit 10/ 29/ 2020   Last refill 03/08/2019, #2 refill.

## 2019-09-19 ENCOUNTER — Other Ambulatory Visit: Payer: Self-pay | Admitting: Family Medicine

## 2019-09-23 ENCOUNTER — Ambulatory Visit: Payer: Medicare Other | Admitting: Family Medicine

## 2019-09-23 ENCOUNTER — Encounter: Payer: Self-pay | Admitting: Family Medicine

## 2019-09-23 ENCOUNTER — Other Ambulatory Visit: Payer: Self-pay

## 2019-09-23 ENCOUNTER — Ambulatory Visit (INDEPENDENT_AMBULATORY_CARE_PROVIDER_SITE_OTHER): Payer: Medicare Other | Admitting: Family Medicine

## 2019-09-23 VITALS — BP 140/70 | HR 86 | Temp 97.0°F | Resp 16 | Ht 64.0 in | Wt 152.0 lb

## 2019-09-23 DIAGNOSIS — R296 Repeated falls: Secondary | ICD-10-CM | POA: Diagnosis not present

## 2019-09-23 DIAGNOSIS — H9311 Tinnitus, right ear: Secondary | ICD-10-CM | POA: Diagnosis not present

## 2019-09-23 DIAGNOSIS — E7800 Pure hypercholesterolemia, unspecified: Secondary | ICD-10-CM

## 2019-09-23 NOTE — Progress Notes (Signed)
Subjective:    Patient ID: Erin Wong, female    DOB: 13-May-1951, 69 y.o.   MRN: [id number]  08/17/18 Patient was here today for scheduled follow-up however she reports increasing shortness of breath and dyspnea on exertion.  She also reports a chestt pressure with activity.  She describes it independently as an elephant on her chest whenever she does moderate to strenuous activity.  She has to stop and rest and the pain will slowly subside.  She was admitted to the hospital in May of last year and underwent a stress test which was considered a low risk study.  Apparently she has nitroglycerin paste.  She will applied nitroglycerin paste whenever she feels this chest pressure and it will improve.  It is only related with exertion.  However she has noticed worsening dyspnea on exertion independent of the chest pressure.  This is been going on for the last 6 months however recently it has intensified and is now occurring every 2 to 3 days.  EKG today shows normal sinus rhythm with no ischemic changes.  At that time, my plan was: Patient's chest pain sounds classic for angina.  I have recommended trying her on Imdur 30 mg p.o. every morning to see if this will help with her chest pain and her dyspnea on exertion.  Meanwhile I would recommend cardiology consultation.  I will also obtain a CBC, CMP, fasting lipid panel.  I would like to try to drive her LDL cholesterol well below 70 given her symptoms.  Blood pressure today is acceptable at 132/70.  Hesitates start a beta-blocker today due to her moderate to severe asthma.  Therefore I will start Imdur as discussed above.  I will likely increase her Lipitor to 80 mg a day and also recommend aspirin 81 mg a day based on the results of her lab work.  Schedule with cardiology as discussed.  02/16/19 Patient never saw cardiology.  Due to the COVID-19 pandemic, her appointment got canceled and was never rescheduled.  Since that time she has been consistently  taking her isosorbide mononitrate and has had no further chest pain.  She states that she feels like she can do whatever she wants to without chest pressure or shortness of breath.  This makes me question my original diagnosis of angina.  Also question if she really needs the isosorbide given the fact she is having no further chest pain and no dyspnea on exertion.  Her blood pressure today is adequately controlled at 130/70.  She denies any myalgias or right upper quadrant pain.  She is due to recheck her fasting lipid panel.  Unfortunately she reports right knee pain.  She is had chronic knee pain due to osteoarthritis in that right knee.  She now reports pain with standing and walking.  She reports pain rising from a seated position.  She has an antalgic gait and can bear very little weight on her knee without significant stabbing pain in both the medial and lateral joint lines.  She is seeing an orthopedist but they are waiting for old records from her previous orthopedist.  09/23/19 Patient presents today with 2 main concerns.  Over the last several months, she feels like she is falling more frequently.  For instance recently she was in the grocery store.  She bent over to look at something on the bottom shelf and when she stood back up, she lost her balance and fell backwards.  She also feels off balance more often  than not particular when she is up walking.  She denies any headache.  She denies any blurry vision.  She denies any double vision.  She denies any nausea or vomiting.  She denies any head trauma.  Balance issues have been chronic for several months.  Of note she has chronic deformity in her right knee.  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  Patient has an antalgic gait.  However Romberg testing today is normal.  Cranial nerves II through XII are grossly intact with muscle strength 5/5 equal and symmetric in the upper and lower extremities.  Cerebellar exam is  normal.  Patient does have an antalgic shuffling gait with the right leg however the left leg is moving normally.  I see no evidence of a stroke.  I see no evidence of increased intracranial pressure.  Dix-Hallpike is negative.  Patient denies any vertigo.  She also reports a noise that she hears at night in her right ear.  She was wondering if the 2 may be linked together.  On examination, the tympanic membrane in the right ear is clear.  There is scarring all over the tympanic membrane and the patient reports frequent ear infections as a child.  There is no current perforation.  There is no erythema or ear effusion.  Patient does endorse tinnitus at night.  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  She is unable to talk on the telephone with her right ear because she cannot hear the conversation. Past Medical History:  Diagnosis Date  . Allergy   . Arthritis   . Asthma    Past Surgical History:  Procedure Laterality Date  . ABDOMINAL HYSTERECTOMY  10/04/1995   Hysterectomy and Bilateral oophorectomy  . COLON SURGERY  09/2009   hole in colon  . FRACTURE SURGERY Right 08/02/2011   Right Leg--Plates & Screws  . 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   at time of colon surgery   Current Outpatient Medications on File Prior to Visit  Medication Sig Dispense Refill  . acetaminophen (TYLENOL) 650 MG CR tablet Take 2 tablets (1,300 mg total) by mouth 2 (two) times daily as needed for pain. 120 tablet 3  . albuterol (PROVENTIL HFA;VENTOLIN HFA) 108 (90 Base) MCG/ACT inhaler Inhale 1 puff into the lungs every 6 (six) hours as needed for wheezing or shortness of breath. 18 g 3  . atorvastatin (LIPITOR) 40 MG tablet TAKE 1 TABLET BY MOUTH EVERY DAY 90 tablet 1  . benazepril (LOTENSIN) 20 MG tablet TAKE 1 TABLET BY MOUTH EVERY DAY 90 tablet 0  . Calcium 500 MG tablet Take 1 tablet (500 mg total) by mouth 3 (three) times daily.  (Patient taking differently: Take 500 mg by mouth daily. ) 90 tablet   . Carbamide Peroxide (EAR WAX DROPS OT) Place 2 drops into the right ear daily as needed (ear wax removal).    . cholecalciferol (VITAMIN D) 1000 units tablet Take 1 tablet (1,000 Units total) by mouth daily. 90 tablet 1  . fluticasone (FLONASE) 50 MCG/ACT nasal spray Place 2 sprays into both nostrils daily. (Patient taking differently: Place 2 sprays into both nostrils daily as needed for allergies or rhinitis. ) 16 g 11  . Fluticasone-Salmeterol (ADVAIR DISKUS) 100-50 MCG/DOSE AEPB INHALE 1 PUFF INTO THE LUNGS TWICE A DAY 60 each 5  . isosorbide mononitrate (IMDUR) 30 MG 24 hr tablet TAKE  1 TABLET BY MOUTH EVERY DAY 90 tablet 2  . omeprazole (PRILOSEC) 20 MG capsule Take 1 capsule (20 mg total) by mouth daily. 90 capsule 3  . polyethylene glycol powder (GLYCOLAX/MIRALAX) powder Take 255 g by mouth 1 day or 1 dose. (Patient taking differently: Take 17 g by mouth daily. ) 255 g 5  . traMADol (ULTRAM) 50 MG tablet TAKE 2 TABLETS BY MOUTH 3 TIMES A DAY AS NEEDED FOR PAIN 180 tablet 2   No current facility-administered medications on file prior to visit.   No Known Allergies Social History   Socioeconomic History  . Marital status: Married    Spouse name: Not on file  . Number of children: Not on file  . Years of education: Not on file  . Highest education level: Not on file  Occupational History  . Not on file  Tobacco Use  . Smoking status: Former Smoker    Quit date: 01/10/1991    Years since quitting: 28.7  . Smokeless tobacco: Never Used  Substance and Sexual Activity  . Alcohol use: No  . Drug use: No  . Sexual activity: Never  Other Topics Concern  . Not on file  Social History Narrative  . Not on file   Social Determinants of Health   Financial Resource Strain:   . Difficulty of Paying Living Expenses: Not on file  Food Insecurity:   . Worried About Charity fundraiser in the Last Year: Not on file    . Ran Out of Food in the Last Year: Not on file  Transportation Needs:   . Lack of Transportation (Medical): Not on file  . Lack of Transportation (Non-Medical): Not on file  Physical Activity:   . Days of Exercise per Week: Not on file  . Minutes of Exercise per Session: Not on file  Stress:   . Feeling of Stress : Not on file  Social Connections:   . Frequency of Communication with Friends and Family: Not on file  . Frequency of Social Gatherings with Friends and Family: Not on file  . Attends Religious Services: Not on file  . Active Member of Clubs or Organizations: Not on file  . Attends Archivist Meetings: Not on file  . Marital Status: Not on file  Intimate Partner Violence:   . Fear of Current or Ex-Partner: Not on file  . Emotionally Abused: Not on file  . Physically Abused: Not on file  . Sexually Abused: Not on file     Review of Systems  All other systems reviewed and are negative.      Objective:   Physical Exam  Constitutional: She is oriented to person, place, and time. She appears well-developed and well-nourished.  HENT:  Right Ear: Ear canal normal. No tenderness. Tympanic membrane is scarred. Tympanic membrane is not injected, not perforated, not erythematous, not retracted and not bulging. No middle ear effusion. Decreased hearing is noted.  Left Ear: Ear canal normal. No tenderness. Tympanic membrane is scarred. Tympanic membrane is not injected, not perforated, not erythematous, not retracted and not bulging.  No middle ear effusion.  Cardiovascular: Normal rate, regular rhythm and normal heart sounds. Exam reveals no gallop and no friction rub.  No murmur heard. Pulmonary/Chest: Effort normal and breath sounds normal. No respiratory distress. She has no wheezes. She has no rales.  Abdominal: Soft. Bowel sounds are normal. She exhibits no distension and no mass. There is no abdominal tenderness. There is no rebound and  no guarding.   Neurological: She is alert and oriented to person, place, and time. She displays normal reflexes. No cranial nerve deficit. She exhibits normal muscle tone. Coordination normal.  Vitals reviewed.         Assessment & Plan:  Pure hypercholesterolemia - Plan: CBC with Differential/Platelet, COMPLETE METABOLIC PANEL WITH GFR, Lipid panel  Falls frequently  Tinnitus aurium, right  Patient request to recheck her cholesterol while she is here today so we will check a CMP and fasting lipid panel.  I believe the frequent falls are not due to any intracranial pathology.  I see no evidence of stroke, cerebellar ataxia, vertigo, etc.  Instead I believe the patient is falling more frequently due to the deformity in her right leg and decreasing strength in her right leg affecting her balance.  Therefore I recommended walking aids such as a walker or cane.  I believe that the hearing loss in her right ear is causing tinnitus which she is experiencing at night.

## 2019-09-24 ENCOUNTER — Other Ambulatory Visit: Payer: Self-pay | Admitting: Family Medicine

## 2019-09-24 DIAGNOSIS — D72828 Other elevated white blood cell count: Secondary | ICD-10-CM

## 2019-09-24 DIAGNOSIS — E871 Hypo-osmolality and hyponatremia: Secondary | ICD-10-CM

## 2019-09-24 LAB — CBC WITH DIFFERENTIAL/PLATELET
Absolute Monocytes: 1306 cells/uL — ABNORMAL HIGH (ref 200–950)
Basophils Absolute: 109 cells/uL (ref 0–200)
Basophils Relative: 0.8 %
Eosinophils Absolute: 435 cells/uL (ref 15–500)
Eosinophils Relative: 3.2 %
HCT: 35.8 % (ref 35.0–45.0)
Hemoglobin: 12.2 g/dL (ref 11.7–15.5)
Lymphs Abs: 4026 cells/uL — ABNORMAL HIGH (ref 850–3900)
MCH: 32.9 pg (ref 27.0–33.0)
MCHC: 34.1 g/dL (ref 32.0–36.0)
MCV: 96.5 fL (ref 80.0–100.0)
MPV: 10.2 fL (ref 7.5–12.5)
Monocytes Relative: 9.6 %
Neutro Abs: 7725 cells/uL (ref 1500–7800)
Neutrophils Relative %: 56.8 %
Platelets: 666 10*3/uL — ABNORMAL HIGH (ref 140–400)
RBC: 3.71 10*6/uL — ABNORMAL LOW (ref 3.80–5.10)
RDW: 12.6 % (ref 11.0–15.0)
Total Lymphocyte: 29.6 %
WBC: 13.6 10*3/uL — ABNORMAL HIGH (ref 3.8–10.8)

## 2019-09-24 LAB — COMPLETE METABOLIC PANEL WITH GFR
AG Ratio: 1.6 (calc) (ref 1.0–2.5)
ALT: 14 U/L (ref 6–29)
AST: 14 U/L (ref 10–35)
Albumin: 3.9 g/dL (ref 3.6–5.1)
Alkaline phosphatase (APISO): 105 U/L (ref 37–153)
BUN/Creatinine Ratio: 15 (calc) (ref 6–22)
BUN: 15 mg/dL (ref 7–25)
CO2: 22 mmol/L (ref 20–32)
Calcium: 9.6 mg/dL (ref 8.6–10.4)
Chloride: 97 mmol/L — ABNORMAL LOW (ref 98–110)
Creat: 1.02 mg/dL — ABNORMAL HIGH (ref 0.50–0.99)
GFR, Est African American: 65 mL/min/{1.73_m2} (ref >=60)
GFR, Est Non African American: 56 mL/min/{1.73_m2} — ABNORMAL LOW (ref >=60)
Globulin: 2.4 g/dL (calc) (ref 1.9–3.7)
Glucose, Bld: 87 mg/dL (ref 65–99)
Potassium: 4.9 mmol/L (ref 3.5–5.3)
Sodium: 132 mmol/L — ABNORMAL LOW (ref 135–146)
Total Bilirubin: 0.5 mg/dL (ref 0.2–1.2)
Total Protein: 6.3 g/dL (ref 6.1–8.1)

## 2019-09-24 LAB — LIPID PANEL
Cholesterol: 181 mg/dL (ref <200)
HDL: 77 mg/dL (ref >=50)
LDL Cholesterol (Calc): 80 mg/dL (calc)
Non-HDL Cholesterol (Calc): 104 mg/dL (calc) (ref <130)
Total CHOL/HDL Ratio: 2.4 (calc) (ref <5.0)
Triglycerides: 138 mg/dL (ref <150)

## 2019-09-30 ENCOUNTER — Other Ambulatory Visit: Payer: Self-pay | Admitting: Family Medicine

## 2019-09-30 DIAGNOSIS — I10 Essential (primary) hypertension: Secondary | ICD-10-CM

## 2019-10-04 ENCOUNTER — Other Ambulatory Visit: Payer: Self-pay | Admitting: Family Medicine

## 2019-11-22 ENCOUNTER — Other Ambulatory Visit: Payer: Self-pay | Admitting: Family Medicine

## 2019-11-22 NOTE — Telephone Encounter (Signed)
Ok to refill??  Last office visit 09/23/2019.  Last refill 07/16/2019, #2 refills.

## 2019-11-25 ENCOUNTER — Ambulatory Visit: Payer: Medicare Other | Attending: Internal Medicine

## 2019-11-25 DIAGNOSIS — Z23 Encounter for immunization: Secondary | ICD-10-CM

## 2019-11-25 NOTE — Progress Notes (Signed)
   Covid-19 Vaccination Clinic  Name:  Erin Wong    MRN: [id number] DOB: November 23, 1950  11/25/2019  [name] was observed post Covid-19 immunization for 15 minutes without incident. She was provided with Vaccine Information Sheet and instruction to access the V-Safe system.   [name] was instructed to call 911 with any severe reactions post vaccine: Marland Kitchen Difficulty breathing  . Swelling of face and throat  . A fast heartbeat  . A bad rash all over body  . Dizziness and weakness   Immunizations Administered    Name Date Dose VIS Date Route   Pfizer COVID-19 Vaccine 11/25/2019  1:28 PM 0.3 mL 08/13/2019 Intramuscular   Manufacturer: [place]   Lot: [id number]   [place]: [id number]

## 2019-12-17 ENCOUNTER — Other Ambulatory Visit: Payer: Self-pay | Admitting: Family Medicine

## 2019-12-20 ENCOUNTER — Ambulatory Visit: Payer: Medicare Other | Attending: Internal Medicine

## 2019-12-20 DIAGNOSIS — Z23 Encounter for immunization: Secondary | ICD-10-CM

## 2019-12-20 NOTE — Progress Notes (Signed)
   Covid-19 Vaccination Clinic  Name:  Erin Wong    MRN: [id number] DOB: Apr 28, 1951  12/20/2019  Erin Wong was observed post Covid-19 immunization for 15 minutes without incident. She was provided with Vaccine Information Sheet and instruction to access the V-Safe system.   Erin Wong was instructed to call 911 with any severe reactions post vaccine: Marland Kitchen Difficulty breathing  . Swelling of face and throat  . A fast heartbeat  . A bad rash all over body  . Dizziness and weakness   Immunizations Administered    Name Date Dose VIS Date Route   Pfizer COVID-19 Vaccine 12/20/2019 11:43 AM 0.3 mL 10/27/2018 Intramuscular   Manufacturer: [place]   Lot: [id number]   [place]: [id number]

## 2019-12-22 ENCOUNTER — Other Ambulatory Visit: Payer: Self-pay | Admitting: Family Medicine

## 2019-12-22 DIAGNOSIS — I10 Essential (primary) hypertension: Secondary | ICD-10-CM

## 2020-01-03 ENCOUNTER — Ambulatory Visit (INDEPENDENT_AMBULATORY_CARE_PROVIDER_SITE_OTHER): Payer: Medicare Other | Admitting: Family Medicine

## 2020-01-03 ENCOUNTER — Other Ambulatory Visit: Payer: Self-pay

## 2020-01-03 ENCOUNTER — Encounter: Payer: Self-pay | Admitting: Family Medicine

## 2020-01-03 VITALS — BP 124/78 | HR 90 | Temp 96.5°F | Resp 14 | Ht 64.0 in | Wt 156.0 lb

## 2020-01-03 DIAGNOSIS — M25561 Pain in right knee: Secondary | ICD-10-CM

## 2020-01-03 DIAGNOSIS — I10 Essential (primary) hypertension: Secondary | ICD-10-CM

## 2020-01-03 DIAGNOSIS — G8929 Other chronic pain: Secondary | ICD-10-CM | POA: Diagnosis not present

## 2020-01-03 NOTE — Progress Notes (Signed)
Subjective:    Patient ID: Erin Wong, female    DOB: 10/11/1950, 69 y.o.   MRN: [id number]  Patient has chronic pain in her right knee.  She presents today with a small effusion.  The knee is also slightly warm to the touch.  She has pain with flexion and extension of her knee.  She has pain with standing and walking or rising from a seated position.  She has a history of osteoarthritis in the right knee and is requesting a cortisone injection.  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  She is also using tramadol occasionally for pain as well as Tylenol.  Medication provides her minimal relief.  She is due for lab work.  She has a history of hypertension along with stage III chronic kidney disease.  We are monitoring this closely.  I did recommend she avoid overusing NSAIDs.  Patient is due for colon cancer screening.  Past Medical History:  Diagnosis Date  . Allergy   . Arthritis   . Asthma    Past Surgical History:  Procedure Laterality Date  . ABDOMINAL HYSTERECTOMY  10/04/1995   Hysterectomy and Bilateral oophorectomy  . COLON SURGERY  09/2009   hole in colon  . FRACTURE SURGERY Right 08/02/2011   Right Leg--Plates & Screws  . 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   at time of colon surgery   Current Outpatient Medications on File Prior to Visit  Medication Sig Dispense Refill  . acetaminophen (TYLENOL) 650 MG CR tablet Take 2 tablets (1,300 mg total) by mouth 2 (two) times daily as needed for pain. 120 tablet 3  . albuterol (PROVENTIL HFA;VENTOLIN HFA) 108 (90 Base) MCG/ACT inhaler Inhale 1 puff into the lungs every 6 (six) hours as needed for wheezing or shortness of breath. 18 g 3  . atorvastatin (LIPITOR) 40 MG tablet TAKE 1 TABLET BY MOUTH EVERY DAY 90 tablet 1  . benazepril (LOTENSIN) 20 MG tablet TAKE 1 TABLET BY MOUTH  EVERY DAY 90 tablet 0  . Calcium 500 MG tablet Take 1 tablet (500 mg total) by mouth 3 (three) times daily. (Patient taking differently: Take 500 mg by mouth daily. ) 90 tablet   . cholecalciferol (VITAMIN D) 1000 units tablet Take 1 tablet (1,000 Units total) by mouth daily. 90 tablet 1  . fluticasone (FLONASE) 50 MCG/ACT nasal spray Place 2 sprays into both nostrils daily. (Patient taking differently: Place 2 sprays into both nostrils daily as needed for allergies or rhinitis. ) 16 g 11  . Fluticasone-Salmeterol (ADVAIR DISKUS) 100-50 MCG/DOSE AEPB INHALE 1 PUFF INTO THE LUNGS TWICE A DAY 60 each 5  . isosorbide mononitrate (IMDUR) 30 MG 24 hr tablet TAKE 1 TABLET BY MOUTH EVERY DAY 90 tablet 2  . omeprazole (PRILOSEC) 20 MG capsule Take 1 capsule (20 mg total) by mouth daily. 90 capsule 3  . polyethylene glycol powder (GLYCOLAX/MIRALAX) powder Take 255 g by mouth 1 day or 1 dose. (Patient taking differently: Take 17 g by mouth daily. ) 255 g 5  . traMADol (ULTRAM) 50 MG tablet TAKE 2 TABLETS BY MOUTH 3 TIMES A DAY AS NEEDED FOR PAIN 180 tablet 2   No current facility-administered medications on file prior to visit.   No Known Allergies Social History   Socioeconomic History  . Marital status: Married  Spouse name: Not on file  . Number of children: Not on file  . Years of education: Not on file  . Highest education level: Not on file  Occupational History  . Not on file  Tobacco Use  . Smoking status: Former Smoker    Quit date: 01/10/1991    Years since quitting: 29.0  . Smokeless tobacco: Never Used  Substance and Sexual Activity  . Alcohol use: No  . Drug use: No  . Sexual activity: Never  Other Topics Concern  . Not on file  Social History Narrative  . Not on file   Social Determinants of Health   Financial Resource Strain:   . Difficulty of Paying Living Expenses:   Food Insecurity:   . Worried About Charity fundraiser in the Last Year:   . Arboriculturist in the  Last Year:   Transportation Needs:   . Film/video editor (Medical):   Marland Kitchen Lack of Transportation (Non-Medical):   Physical Activity:   . Days of Exercise per Week:   . Minutes of Exercise per Session:   Stress:   . Feeling of Stress :   Social Connections:   . Frequency of Communication with Friends and Family:   . Frequency of Social Gatherings with Friends and Family:   . Attends Religious Services:   . Active Member of Clubs or Organizations:   . Attends Archivist Meetings:   Marland Kitchen Marital Status:   Intimate Partner Violence:   . Fear of Current or Ex-Partner:   . Emotionally Abused:   Marland Kitchen Physically Abused:   . Sexually Abused:      Review of Systems  All other systems reviewed and are negative.      Objective:   Physical Exam  Constitutional: She appears well-developed and well-nourished.  Cardiovascular: Normal rate, regular rhythm and normal heart sounds. Exam reveals no gallop and no friction rub.  No murmur heard. Pulmonary/Chest: Effort normal and breath sounds normal. No respiratory distress. She has no wheezes. She has no rales.  Abdominal: Soft. Bowel sounds are normal. She exhibits no distension and no mass. There is no abdominal tenderness. There is no rebound and no guarding.  Musculoskeletal:     Right knee: Effusion present. Decreased range of motion. Tenderness present over the medial joint line and lateral joint line.  Vitals reviewed.         Assessment & Plan:  Chronic pain of right knee  Benign essential HTN - Plan: CBC with Differential/Platelet, COMPLETE METABOLIC PANEL WITH GFR, Lipid panel  Patient has chronic pain in the right knee.  I recommended consulting with an orthopedic surgeon about potential surgical options to correct her underlying abnormality.  Patient will consider that however at the present time she would like to proceed with a cortisone injection.  Her blood pressure is well controlled today at 124/78.  I  recommended that she avoid chronic use of NSAIDs so that we do not exacerbate her chronic kidney disease.  She is also due to recheck her cholesterol.  She is due for a colonoscopy.  Therefore I will schedule the patient for Cologuard as she would like to decline colonoscopy now but does agree to receive Cologuard screening.  Using sterile technique, the right knee was injected with 2 cc lidocaine, 2 cc of Marcaine, and 2 cc of 40 mg/mL Kenalog.  Patient tolerated the procedure well without complication.

## 2020-01-04 ENCOUNTER — Encounter: Payer: Self-pay | Admitting: Family Medicine

## 2020-01-04 DIAGNOSIS — N183 Chronic kidney disease, stage 3 unspecified: Secondary | ICD-10-CM | POA: Insufficient documentation

## 2020-01-04 DIAGNOSIS — I10 Essential (primary) hypertension: Secondary | ICD-10-CM | POA: Insufficient documentation

## 2020-01-04 DIAGNOSIS — E785 Hyperlipidemia, unspecified: Secondary | ICD-10-CM | POA: Insufficient documentation

## 2020-01-04 LAB — CBC WITH DIFFERENTIAL/PLATELET
Absolute Monocytes: 1243 cells/uL — ABNORMAL HIGH (ref 200–950)
Basophils Absolute: 65 cells/uL (ref 0–200)
Basophils Relative: 0.6 %
Eosinophils Absolute: 229 cells/uL (ref 15–500)
Eosinophils Relative: 2.1 %
HCT: 36.5 % (ref 35.0–45.0)
Hemoglobin: 12.1 g/dL (ref 11.7–15.5)
Lymphs Abs: 2496 cells/uL (ref 850–3900)
MCH: 31.8 pg (ref 27.0–33.0)
MCHC: 33.2 g/dL (ref 32.0–36.0)
MCV: 95.8 fL (ref 80.0–100.0)
MPV: 11.2 fL (ref 7.5–12.5)
Monocytes Relative: 11.4 %
Neutro Abs: 6867 cells/uL (ref 1500–7800)
Neutrophils Relative %: 63 %
Platelets: 404 10*3/uL — ABNORMAL HIGH (ref 140–400)
RBC: 3.81 10*6/uL (ref 3.80–5.10)
RDW: 11.8 % (ref 11.0–15.0)
Total Lymphocyte: 22.9 %
WBC: 10.9 10*3/uL — ABNORMAL HIGH (ref 3.8–10.8)

## 2020-01-04 LAB — COMPLETE METABOLIC PANEL WITH GFR
AG Ratio: 1.4 (calc) (ref 1.0–2.5)
ALT: 18 U/L (ref 6–29)
AST: 15 U/L (ref 10–35)
Albumin: 3.8 g/dL (ref 3.6–5.1)
Alkaline phosphatase (APISO): 96 U/L (ref 37–153)
BUN/Creatinine Ratio: 22 (calc) (ref 6–22)
BUN: 24 mg/dL (ref 7–25)
CO2: 24 mmol/L (ref 20–32)
Calcium: 9.3 mg/dL (ref 8.6–10.4)
Chloride: 104 mmol/L (ref 98–110)
Creat: 1.11 mg/dL — ABNORMAL HIGH (ref 0.50–0.99)
GFR, Est African American: 59 mL/min/{1.73_m2} — ABNORMAL LOW (ref >=60)
GFR, Est Non African American: 51 mL/min/{1.73_m2} — ABNORMAL LOW (ref >=60)
Globulin: 2.7 g/dL (calc) (ref 1.9–3.7)
Glucose, Bld: 93 mg/dL (ref 65–99)
Potassium: 4.8 mmol/L (ref 3.5–5.3)
Sodium: 139 mmol/L (ref 135–146)
Total Bilirubin: 0.5 mg/dL (ref 0.2–1.2)
Total Protein: 6.5 g/dL (ref 6.1–8.1)

## 2020-01-04 LAB — LIPID PANEL
Cholesterol: 150 mg/dL (ref <200)
HDL: 84 mg/dL (ref >=50)
LDL Cholesterol (Calc): 51 mg/dL (calc)
Non-HDL Cholesterol (Calc): 66 mg/dL (calc) (ref <130)
Total CHOL/HDL Ratio: 1.8 (calc) (ref <5.0)
Triglycerides: 69 mg/dL (ref <150)

## 2020-01-06 ENCOUNTER — Telehealth: Payer: Self-pay | Admitting: *Deleted

## 2020-01-06 NOTE — Telephone Encounter (Signed)
Received verbal orders for Cologuard.   Order placed via [facility].   Cologuard (Order [id number])

## 2020-01-06 NOTE — Telephone Encounter (Signed)
-----   Message from Alyson Locket, [place] sent at 01/03/2020  4:07 PM EDT -----  ----- Message ----- From: [name], MD Sent: 01/03/2020   3:46 PM EDT To: Alyson Locket, RMA  Please order cologuard

## 2020-01-13 ENCOUNTER — Encounter (HOSPITAL_COMMUNITY): Payer: Self-pay | Admitting: Emergency Medicine

## 2020-01-13 ENCOUNTER — Inpatient Hospital Stay (HOSPITAL_COMMUNITY)
Admission: EM | Admit: 2020-01-13 | Discharge: 2020-01-19 | DRG: 308 | Disposition: A | Discharge status: Home / Self Care | Payer: Medicare Other | Attending: Cardiovascular Disease | Admitting: Cardiovascular Disease

## 2020-01-13 ENCOUNTER — Other Ambulatory Visit: Payer: Self-pay

## 2020-01-13 ENCOUNTER — Emergency Department (HOSPITAL_COMMUNITY): Payer: Medicare Other

## 2020-01-13 ENCOUNTER — Inpatient Hospital Stay (HOSPITAL_COMMUNITY): Payer: Medicare Other

## 2020-01-13 DIAGNOSIS — Z79899 Other long term (current) drug therapy: Secondary | ICD-10-CM | POA: Diagnosis not present

## 2020-01-13 DIAGNOSIS — I130 Hypertensive heart and chronic kidney disease with heart failure and stage 1 through stage 4 chronic kidney disease, or unspecified chronic kidney disease: Secondary | ICD-10-CM | POA: Diagnosis present

## 2020-01-13 DIAGNOSIS — I340 Nonrheumatic mitral (valve) insufficiency: Secondary | ICD-10-CM | POA: Diagnosis not present

## 2020-01-13 DIAGNOSIS — I4819 Other persistent atrial fibrillation: Principal | ICD-10-CM | POA: Diagnosis present

## 2020-01-13 DIAGNOSIS — R7989 Other specified abnormal findings of blood chemistry: Secondary | ICD-10-CM | POA: Diagnosis not present

## 2020-01-13 DIAGNOSIS — Z90710 Acquired absence of both cervix and uterus: Secondary | ICD-10-CM | POA: Diagnosis not present

## 2020-01-13 DIAGNOSIS — Z20822 Contact with and (suspected) exposure to covid-19: Secondary | ICD-10-CM | POA: Diagnosis present

## 2020-01-13 DIAGNOSIS — E785 Hyperlipidemia, unspecified: Secondary | ICD-10-CM | POA: Diagnosis not present

## 2020-01-13 DIAGNOSIS — R0602 Shortness of breath: Secondary | ICD-10-CM

## 2020-01-13 DIAGNOSIS — J45909 Unspecified asthma, uncomplicated: Secondary | ICD-10-CM | POA: Diagnosis not present

## 2020-01-13 DIAGNOSIS — I429 Cardiomyopathy, unspecified: Secondary | ICD-10-CM | POA: Diagnosis present

## 2020-01-13 DIAGNOSIS — N183 Chronic kidney disease, stage 3 unspecified: Secondary | ICD-10-CM | POA: Diagnosis not present

## 2020-01-13 DIAGNOSIS — K5900 Constipation, unspecified: Secondary | ICD-10-CM | POA: Diagnosis present

## 2020-01-13 DIAGNOSIS — I10 Essential (primary) hypertension: Secondary | ICD-10-CM | POA: Diagnosis present

## 2020-01-13 DIAGNOSIS — I5021 Acute systolic (congestive) heart failure: Secondary | ICD-10-CM

## 2020-01-13 DIAGNOSIS — N1832 Chronic kidney disease, stage 3b: Secondary | ICD-10-CM | POA: Diagnosis present

## 2020-01-13 DIAGNOSIS — Z9081 Acquired absence of spleen: Secondary | ICD-10-CM | POA: Diagnosis not present

## 2020-01-13 DIAGNOSIS — I4891 Unspecified atrial fibrillation: Secondary | ICD-10-CM | POA: Diagnosis not present

## 2020-01-13 DIAGNOSIS — Z87891 Personal history of nicotine dependence: Secondary | ICD-10-CM

## 2020-01-13 DIAGNOSIS — K219 Gastro-esophageal reflux disease without esophagitis: Secondary | ICD-10-CM | POA: Diagnosis present

## 2020-01-13 DIAGNOSIS — I361 Nonrheumatic tricuspid (valve) insufficiency: Secondary | ICD-10-CM | POA: Diagnosis not present

## 2020-01-13 DIAGNOSIS — I129 Hypertensive chronic kidney disease with stage 1 through stage 4 chronic kidney disease, or unspecified chronic kidney disease: Secondary | ICD-10-CM | POA: Diagnosis not present

## 2020-01-13 LAB — HEPATIC FUNCTION PANEL
ALT: 57 U/L — ABNORMAL HIGH (ref 0–44)
AST: 37 U/L (ref 15–41)
Albumin: 3.7 g/dL (ref 3.5–5.0)
Alkaline Phosphatase: 75 U/L (ref 38–126)
Bilirubin, Direct: 0.2 mg/dL (ref 0.0–0.2)
Indirect Bilirubin: 1 mg/dL — ABNORMAL HIGH (ref 0.3–0.9)
Total Bilirubin: 1.2 mg/dL (ref 0.3–1.2)
Total Protein: 6.3 g/dL — ABNORMAL LOW (ref 6.5–8.1)

## 2020-01-13 LAB — MAGNESIUM
Magnesium: 2 mg/dL (ref 1.7–2.4)
Magnesium: 1.8 mg/dL (ref 1.7–2.4)

## 2020-01-13 LAB — BASIC METABOLIC PANEL
Anion gap: 11 (ref 5–15)
BUN: 24 mg/dL — ABNORMAL HIGH (ref 8–23)
CO2: 22 mmol/L (ref 22–32)
Calcium: 9 mg/dL (ref 8.9–10.3)
Chloride: 102 mmol/L (ref 98–111)
Creatinine, Ser: 1.24 mg/dL — ABNORMAL HIGH (ref 0.44–1.00)
GFR calc Af Amer: 51 mL/min — ABNORMAL LOW (ref >60)
GFR calc non Af Amer: 44 mL/min — ABNORMAL LOW (ref >60)
Glucose, Bld: 137 mg/dL — ABNORMAL HIGH (ref 70–99)
Potassium: 4.1 mmol/L (ref 3.5–5.1)
Sodium: 135 mmol/L (ref 135–145)

## 2020-01-13 LAB — CBC
HCT: 41.4 % (ref 36.0–46.0)
Hemoglobin: 13 g/dL (ref 12.0–15.0)
MCH: 31.9 pg (ref 26.0–34.0)
MCHC: 31.4 g/dL (ref 30.0–36.0)
MCV: 101.7 fL — ABNORMAL HIGH (ref 80.0–100.0)
Platelets: 348 10*3/uL (ref 150–400)
RBC: 4.07 MIL/uL (ref 3.87–5.11)
RDW: 13.9 % (ref 11.5–15.5)
WBC: 14.2 10*3/uL — ABNORMAL HIGH (ref 4.0–10.5)
nRBC: 0.4 % — ABNORMAL HIGH (ref 0.0–0.2)

## 2020-01-13 LAB — TSH
TSH: 1.494 u[IU]/mL (ref 0.350–4.500)
TSH: 1.525 u[IU]/mL (ref 0.350–4.500)

## 2020-01-13 LAB — BRAIN NATRIURETIC PEPTIDE: B Natriuretic Peptide: 983.1 pg/mL — ABNORMAL HIGH (ref 0.0–100.0)

## 2020-01-13 LAB — SARS CORONAVIRUS 2 BY RT PCR (HOSPITAL ORDER, PERFORMED IN ~~LOC~~ HOSPITAL LAB): SARS Coronavirus 2: NEGATIVE

## 2020-01-13 LAB — TROPONIN I (HIGH SENSITIVITY)
Troponin I (High Sensitivity): 28 ng/L — ABNORMAL HIGH (ref <18)
Troponin I (High Sensitivity): 24 ng/L — ABNORMAL HIGH (ref <18)

## 2020-01-13 LAB — HIV ANTIBODY (ROUTINE TESTING W REFLEX): HIV Screen 4th Generation wRfx: NONREACTIVE

## 2020-01-13 MED ORDER — DILTIAZEM HCL-DEXTROSE 125-5 MG/125ML-% IV SOLN (PREMIX)
5.0000 mg/h | INTRAVENOUS | Status: DC
  Administered 2020-01-13: 5 mg/h via INTRAVENOUS
  Administered 2020-01-14: 12.5 mg/h via INTRAVENOUS
  Filled 2020-01-13 (×3): qty 125

## 2020-01-13 MED ORDER — SODIUM CHLORIDE 0.9% FLUSH
3.0000 mL | Freq: Two times a day (BID) | INTRAVENOUS | Status: DC
  Administered 2020-01-14 – 2020-01-19 (×8): 3 mL via INTRAVENOUS

## 2020-01-13 MED ORDER — ATORVASTATIN CALCIUM 40 MG PO TABS
40.0000 mg | ORAL_TABLET | Freq: Every day | ORAL | Status: DC
  Administered 2020-01-13 – 2020-01-18 (×6): 40 mg via ORAL
  Filled 2020-01-13 (×7): qty 1

## 2020-01-13 MED ORDER — ZOLPIDEM TARTRATE 5 MG PO TABS
5.0000 mg | ORAL_TABLET | Freq: Every evening | ORAL | Status: DC | PRN
  Administered 2020-01-16 – 2020-01-19 (×3): 5 mg via ORAL
  Filled 2020-01-13 (×3): qty 1

## 2020-01-13 MED ORDER — ISOSORBIDE MONONITRATE ER 30 MG PO TB24
30.0000 mg | ORAL_TABLET | Freq: Every day | ORAL | Status: DC
  Administered 2020-01-14: 30 mg via ORAL
  Filled 2020-01-13 (×2): qty 1

## 2020-01-13 MED ORDER — DILTIAZEM LOAD VIA INFUSION
20.0000 mg | Freq: Once | INTRAVENOUS | Status: AC
  Administered 2020-01-13: 20 mg via INTRAVENOUS
  Filled 2020-01-13: qty 20

## 2020-01-13 MED ORDER — FLUTICASONE PROPIONATE 50 MCG/ACT NA SUSP
2.0000 | Freq: Every day | NASAL | Status: DC
  Administered 2020-01-14 – 2020-01-19 (×5): 2 via NASAL
  Filled 2020-01-13: qty 16

## 2020-01-13 MED ORDER — SODIUM CHLORIDE 0.9% FLUSH: 3.0000 mL | INTRAVENOUS | Status: DC | PRN

## 2020-01-13 MED ORDER — ALBUTEROL SULFATE (2.5 MG/3ML) 0.083% IN NEBU: 2.5000 mg | INHALATION_SOLUTION | Freq: Four times a day (QID) | RESPIRATORY_TRACT | Status: DC | PRN

## 2020-01-13 MED ORDER — APIXABAN 5 MG PO TABS
5.0000 mg | ORAL_TABLET | Freq: Two times a day (BID) | ORAL | Status: DC
  Administered 2020-01-13 – 2020-01-19 (×12): 5 mg via ORAL
  Filled 2020-01-13 (×13): qty 1

## 2020-01-13 MED ORDER — SODIUM CHLORIDE 0.9% FLUSH
3.0000 mL | Freq: Once | INTRAVENOUS | Status: AC
  Administered 2020-01-13: 3 mL via INTRAVENOUS

## 2020-01-13 MED ORDER — ACETAMINOPHEN 325 MG PO TABS
650.0000 mg | ORAL_TABLET | ORAL | Status: DC | PRN
  Administered 2020-01-15 – 2020-01-19 (×2): 650 mg via ORAL
  Filled 2020-01-13 (×2): qty 2

## 2020-01-13 MED ORDER — PANTOPRAZOLE SODIUM 40 MG PO TBEC
40.0000 mg | DELAYED_RELEASE_TABLET | Freq: Every day | ORAL | Status: DC
  Administered 2020-01-13 – 2020-01-19 (×7): 40 mg via ORAL
  Filled 2020-01-13 (×7): qty 1

## 2020-01-13 MED ORDER — ALBUTEROL SULFATE HFA 108 (90 BASE) MCG/ACT IN AERS
1.0000 | INHALATION_SPRAY | Freq: Four times a day (QID) | RESPIRATORY_TRACT | Status: DC | PRN
  Filled 2020-01-13: qty 6.7

## 2020-01-13 MED ORDER — FUROSEMIDE 10 MG/ML IJ SOLN
20.0000 mg | Freq: Two times a day (BID) | INTRAMUSCULAR | Status: DC
  Administered 2020-01-13: 20 mg via INTRAVENOUS
  Filled 2020-01-13: qty 2

## 2020-01-13 MED ORDER — MOMETASONE FURO-FORMOTEROL FUM 100-5 MCG/ACT IN AERO
2.0000 | INHALATION_SPRAY | Freq: Two times a day (BID) | RESPIRATORY_TRACT | Status: DC
  Administered 2020-01-13 – 2020-01-19 (×12): 2 via RESPIRATORY_TRACT
  Filled 2020-01-13 (×2): qty 8.8

## 2020-01-13 MED ORDER — ISOSORBIDE MONONITRATE ER 30 MG PO TB24: 30.0000 mg | ORAL_TABLET | Freq: Every day | ORAL | Status: DC

## 2020-01-13 MED ORDER — SODIUM CHLORIDE 0.9 % IV SOLN: 250.0000 mL | INTRAVENOUS | Status: DC | PRN

## 2020-01-13 MED ORDER — ONDANSETRON HCL 4 MG/2ML IJ SOLN
4.0000 mg | Freq: Four times a day (QID) | INTRAMUSCULAR | Status: DC | PRN
  Administered 2020-01-14: 4 mg via INTRAVENOUS
  Filled 2020-01-13: qty 2

## 2020-01-13 MED ORDER — ALPRAZOLAM 0.25 MG PO TABS: 0.2500 mg | ORAL_TABLET | Freq: Two times a day (BID) | ORAL | Status: DC | PRN

## 2020-01-13 NOTE — Progress Notes (Signed)
Dulera not on the floor, RT reached out to Pharmacy to tube to the floor, when the medication arrives the Nurse will administer per RT request. Thank you J Kynedi Profitt RT

## 2020-01-13 NOTE — Plan of Care (Signed)
Patient admitted to unit A/Ox4, RA, able to ambulate with walker. Cardizem gtt infusing and titrated appropriately. Currently A. Fib, rate 90-110s. Problem: Safety: Goal: Ability to remain free from injury will improve Outcome: Progressing

## 2020-01-13 NOTE — ED Notes (Signed)
Dinner Tray Ordered @ 1806. 

## 2020-01-13 NOTE — ED Triage Notes (Signed)
Pt endorses sob and upper abd pain for the past few days, states she gets out of breath very easily. HR noted in the 140-s150s in triage. Pt denies hx of afib. resp e/u, nad.

## 2020-01-13 NOTE — H&P (Addendum)
Cardiology Admission History and Physical:   Patient ID: Erin Wong MRN: [id number]; DOB: 02/21/1951   Admission date: 01/13/2020  Primary Care Provider: [name], MD Primary Cardiologist: No primary care provider on file.  Primary Electrophysiologist:  None   Chief Complaint:  SOB and Palpitations  Patient Profile:   Erin Wong is a 69 y.o. female with history of chronic knee pain, low risk stress test in 2018, hyperlipidemia, HTN, asthma, CKD stage 3 who is being evaluated for sob and palpitations.   History of Present Illness:   Ms. Erin Wong had a low risk stress test in 2018 with no signs of ischemia, EF 54%. She saw her PCP 02/2019 for chest pain and was referred to cardiology but never followed-up. At that time she was started on Imdur. She takes atorvastatin for HLD. She takes Imdur and Lotensin for blood pressure. She has no past history of MI, stent, stroke, or DM. Her brother had congenital heart disease.   The patient presented to the ED for 01/13/11 for palpitations and sob. Symptoms started 1 weeks ago. Noticed sob on exertion and had to stop and catch her breath. She also felt tired and more fatigued. They went to the beach and felt symptoms got worse. She also had some lower leg edema and some abdominal pain and fullness.  She did have some intermittent palpitations and dull chest discomfort. This morning she woke and and could hardly walk to her bathroom she was so sob so she came into the ED. Denies recent fever, chills, illness. She quit smoking about 30 years ago. She drinks 10 beers on the weekends. No drug use.   In the ED BP 107/84, pulse 111, RR 28, 95% O2. EKG showed Afib RVR with rates up to 140. Labs showed glucose 137, creatinine 1.24, WBC 14.2, ALT 57, BNP 983. HS troponin 28. CXR showed mild cardiomegaly, mild aortic atherosclerotic calcification and no active process on exam. She was started on IV dilt and cardiology was consulted for possible  admission.   Past Medical History:  Diagnosis Date   Allergy    Arthritis    Asthma    Benign essential HTN    CKD (chronic kidney disease) stage 3, GFR 30-59 ml/min    HLD (hyperlipidemia)     Past Surgical History:  Procedure Laterality Date   ABDOMINAL HYSTERECTOMY  10/04/1995   Hysterectomy and Bilateral oophorectomy   COLON SURGERY  09/2009   hole in colon   FRACTURE SURGERY Right 08/02/2011   Right Leg--Plates & Screws   HAND SURGERY Right 09/02/2006   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--Arthritis   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   at time of colon surgery     Medications Prior to Admission: Prior to Admission medications   Medication Sig Start Date End Date Taking? Authorizing Provider  acetaminophen (TYLENOL) 650 MG CR tablet Take 2 tablets (1,300 mg total) by mouth 2 (two) times daily as needed for pain. 02/12/17   Orlena Sheldon, PA-C  albuterol (PROVENTIL HFA;VENTOLIN HFA) 108 (90 Base) MCG/ACT inhaler Inhale 1 puff into the lungs every 6 (six) hours as needed for wheezing or shortness of breath. 01/05/18   Dena Billet B, PA-C  atorvastatin (LIPITOR) 40 MG tablet TAKE 1 TABLET BY MOUTH EVERY DAY 10/04/19   [name], MD  benazepril (LOTENSIN) 20 MG tablet TAKE 1 TABLET BY MOUTH EVERY DAY 12/22/19   [name], MD  Calcium 500 MG tablet Take 1 tablet (500 mg  total) by mouth 3 (three) times daily. Patient taking differently: Take 500 mg by mouth daily.  08/05/16   Orlena Sheldon, PA-C  cholecalciferol (VITAMIN D) 1000 units tablet Take 1 tablet (1,000 Units total) by mouth daily. 08/05/16   Dena Billet B, PA-C  fluticasone (FLONASE) 50 MCG/ACT nasal spray Place 2 sprays into both nostrils daily. Patient taking differently: Place 2 sprays into both nostrils daily as needed for allergies or rhinitis.  08/05/16   Dena Billet B, PA-C  Fluticasone-Salmeterol (ADVAIR DISKUS) 100-50 MCG/DOSE AEPB INHALE 1 PUFF INTO THE LUNGS TWICE A DAY 12/17/19   [name], MD  isosorbide  mononitrate (IMDUR) 30 MG 24 hr tablet TAKE 1 TABLET BY MOUTH EVERY DAY 09/20/19   [name], MD  omeprazole (PRILOSEC) 20 MG capsule Take 1 capsule (20 mg total) by mouth daily. 02/15/19   [name], MD  polyethylene glycol powder (GLYCOLAX/MIRALAX) powder Take 255 g by mouth 1 day or 1 dose. Patient taking differently: Take 17 g by mouth daily.  08/05/16   Dixon, Lonie Peak, PA-C  traMADol (ULTRAM) 50 MG tablet TAKE 2 TABLETS BY MOUTH 3 TIMES A DAY AS NEEDED FOR PAIN 11/22/19   [name], MD     Allergies:   No Known Allergies  Social History:   Social History   Socioeconomic History   Marital status: Married    Spouse name: Not on file   Number of children: Not on file   Years of education: Not on file   Highest education level: Not on file  Occupational History   Not on file  Tobacco Use   Smoking status: Former Smoker    Quit date: 01/10/1991    Years since quitting: 29.0   Smokeless tobacco: Never Used  Substance and Sexual Activity   Alcohol use: No   Drug use: No   Sexual activity: Never  Other Topics Concern   Not on file  Social History Narrative   Not on file   Social Determinants of Health   Financial Resource Strain:    Difficulty of Paying Living Expenses:   Food Insecurity:    Worried About Charity fundraiser in the Last Year:    Arboriculturist in the Last Year:   Transportation Needs:    Film/video editor (Medical):    Lack of Transportation (Non-Medical):   Physical Activity:    Days of Exercise per Week:    Minutes of Exercise per Session:   Stress:    Feeling of Stress :   Social Connections:    Frequency of Communication with Friends and Family:    Frequency of Social Gatherings with Friends and Family:    Attends Religious Services:    Active Member of Clubs or Organizations:    Attends Music therapist:    Marital Status:   Intimate Partner Violence:    Fear of Current or Ex-Partner:    Emotionally  Abused:    Physically Abused:    Sexually Abused:     Family History:   The patient's family history includes Alcohol abuse in her brother and father; Arthritis in her mother; Asthma in her maternal aunt; COPD in her brother; Cancer in her mother; Diabetes in her maternal aunt; Miscarriages / Stillbirths in her mother.    ROS:  Please see the history of present illness.  All other ROS reviewed and negative.     Physical Exam/Data:   Vitals:  01/13/20 1430 01/13/20 1445 01/13/20 1500 01/13/20 1515  BP: 114/90 114/78  107/84  Pulse: (!) 107 (!) 127 (!) 109 (!) 111  Resp: 18 19 19  (!) 28  SpO2: 95% 96% 96% 95%   No intake or output data in the 24 hours ending 01/13/20 1616 Last 3 Weights 01/03/2020 09/23/2019 07/01/2019  Weight (lbs) 156 lb 152 lb 152 lb  Weight (kg) 70.761 kg 68.947 kg 68.947 kg     There is no height or weight on file to calculate BMI.  General:  Well nourished, well developed, in no acute distress HEENT: normal Lymph: no adenopathy Neck: no JVD Endocrine:  No thryomegaly Vascular: No carotid bruits; FA pulses 2+ bilaterally without bruits  Cardiac:  normal S1, S2; Irreg Irreg; no murmur  Lungs:  clear to auscultation bilaterally, no wheezing, rhonchi or rales  Abd: soft, nontender, no hepatomegaly  Ext: mild edema Musculoskeletal:  No deformities, BUE and BLE strength normal and equal Skin: warm and dry  Neuro:  CNs 2-12 intact, no focal abnormalities noted Psych:  Normal affect    EKG:  The ECG that was done 01/16/11 was personally reviewed and demonstrates Afib RVR, rate 138  Relevant CV Studies:  Order echo  Laboratory Data:  High Sensitivity Troponin:   Recent Labs  Lab 01/13/20 1403  TROPONINIHS 28*      Chemistry Recent Labs  Lab 01/13/20 1403  NA 135  K 4.1  CL 102  CO2 22  GLUCOSE 137*  BUN 24*  CREATININE 1.24*  CALCIUM 9.0  GFRNONAA 44*  GFRAA 51*  ANIONGAP 11    Recent Labs  Lab 01/13/20 1403  PROT 6.3*  ALBUMIN 3.7   AST 37  ALT 57*  ALKPHOS 75  BILITOT 1.2   Hematology Recent Labs  Lab 01/13/20 1403  WBC 14.2*  RBC 4.07  HGB 13.0  HCT 41.4  MCV 101.7*  MCH 31.9  MCHC 31.4  RDW 13.9  PLT 348   BNP Recent Labs  Lab 01/13/20 1418  BNP 983.1*    DDimer No results for input(s): DDIMER in the last 168 hours.   Radiology/Studies:  DG Chest Port 1 View  Result Date: 01/13/2020 CLINICAL DATA:  Short of breath and upper abdominal pain over the last several days. EXAM: PORTABLE CHEST 1 VIEW COMPARISON:  01/13/2017 FINDINGS: Mild cardiomegaly. Mild aortic atherosclerotic calcification. Pulmonary vascularity is normal. Chronic pleural and parenchymal scarring at the left base. No sign of active infiltrate or mass lesion. No acute bone finding. IMPRESSION: Mild cardiomegaly. Mild aortic atherosclerotic calcification. Chronic pleural and parenchymal scarring at the left base. No active process visible on this one view exam. Electronically Signed   By: Nelson Chimes M.D.   On: 01/13/2020 14:57     Assessment and Plan:   New onset afib presents with 1 week of sob and palpitations found to be in rapid afib with rates up to 140 started on IV dilt - CHADSVASC = 3 (age, female, HTN) Patient will need long-term anticoagulation - check TSH - K+>4 - Mag >2 - Rates are improving with IV dilt - order echo - Recommend decreasing alcohol intake. No significant caffeine intake - can likely change to PO dilt tomorrow - if patient does not convert can consider TEE/DCCV  Acute heart failure - appears to have some symptoms of heart failure likely exacerbated by rapid afib  - BNP is up to 983 - Abdomen is full and has some mild LLE on exam - IV  lasix 20mg  BID - creatinine 1.24, up from baseline around 1 - monitor strict I/Os, daily weights, and creatinine with diuresis - order echo as above  CKD stage 3 - monitor creatinine with diuretics - slightly up from baseline - will hold lisinopril  HTN -  Benazapril and Imdur - has some soft pressures, hold ACE as above - IV dilt  HLD - atorvastatin - check AM lipids  Elevated ALT/epigastric pain - order RUQ [place] - trend levels   Severity of Illness: The appropriate patient status for this patient is INPATIENT. Inpatient status is judged to be reasonable and necessary in order to provide the required intensity of service to ensure the patient's safety. The patient's presenting symptoms, physical exam findings, and initial radiographic and laboratory data in the context of their chronic comorbidities is felt to place them at high risk for further clinical deterioration. Furthermore, it is not anticipated that the patient will be medically stable for discharge from the hospital within 2 midnights of admission. The following factors support the patient status of inpatient.   " The patient's presenting symptoms include shortness of breath. " The worrisome physical exam findings include afib. " The initial radiographic and laboratory data are worrisome because of elevated BNP. " The chronic co-morbidities include HTN and HLD.   * I certify that at the point of admission it is my clinical judgment that the patient will require inpatient hospital care spanning beyond 2 midnights from the point of admission due to high intensity of service, high risk for further deterioration and high frequency of surveillance required.*    For questions or updates, please contact [facility] Please consult [email or web address] for contact info under        Signed, Cadence Ninfa Meeker, PA-C  01/13/2020 4:16 PM   Patient examined chart reviewed. Discussed care with patient, husband and PA. Exam with elderly female in no distress Basilar rales no murmur soft abdomen no thyromegaly and trace edema. New onset afib complicated by CHF and elevated BNP. Discussed drinking habits. She has too much beer / wine 3 days/week Continue iv cardizem and transition to PO in am. Lasix  20 mg iv bid for CHF. Eliquis 5 mg bid for anticoagulation no contraindications CHADVASC 3.  TTE in am if EF normal and no bad valve disease can consider 48 hours stay and d/c for outpatient [facility]. If EF is down may need to stay till Monday or early next week for TEE/DCC Add TSH/T4 to lab work   Jenkins Rouge MD [facility]

## 2020-01-13 NOTE — ED Provider Notes (Signed)
[facility] EMERGENCY DEPARTMENT Provider Note   CSN: [id number] Arrival date & time: 01/13/20  1347     History Chief Complaint  Patient presents with  . Shortness of Breath  . Abdominal Pain    Erin Wong is a 69 y.o. female.  69 y/o F with PMH of asthma presenting with palpitations, epigastric pain and generally feeling unwell which started one week ago. No hx of arrhythmia in the past. Endorses SOB. Denies fever, chills, n/v/d, dysuria.         Past Medical History:  Diagnosis Date  . Allergy   . Arthritis   . Asthma   . Benign essential HTN   . CKD (chronic kidney disease) stage 3, GFR 30-59 ml/min   . HLD (hyperlipidemia)     Patient Active Problem List   Diagnosis Date Noted  . Atrial fibrillation with rapid ventricular response ([place]) 01/13/2020  . CKD (chronic kidney disease) stage 3, GFR 30-59 ml/min   . Benign essential HTN   . HLD (hyperlipidemia)   . Hyperlipidemia 02/12/2017  . AKI (acute kidney injury) ([place])   . Chest pain 01/13/2017  . Chest pressure   . Acute kidney injury superimposed on CKD ([place])   . Osteoporosis 10/18/2015  . Vitamin D deficiency 07/20/2015  . HTN (hypertension) 07/17/2015  . GERD (gastroesophageal reflux disease) 01/12/2015  . Chronic constipation 01/12/2015  . History of colon surgery 01/12/2015  . History of total hysterectomy with bilateral salpingo-oophorectomy (BSO) 01/12/2015  . Allergy   . Asthma     Past Surgical History:  Procedure Laterality Date  . ABDOMINAL HYSTERECTOMY  10/04/1995   Hysterectomy and Bilateral oophorectomy  . COLON SURGERY  09/2009   hole in colon  . FRACTURE SURGERY Right 08/02/2011   Right Leg--Plates & Screws  . 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   at time of colon surgery     OB History   No obstetric history on file.     Family History  Problem Relation Age of Onset  . Arthritis Mother    . Cancer Mother        Lung Cancer--Had quit smoking for 25 years  . Miscarriages / [place] Mother   . Alcohol abuse Father   . Alcohol abuse Brother   . COPD Brother   . Asthma Maternal Aunt   . Diabetes Maternal Aunt     Social History   Tobacco Use  . Smoking status: Former Smoker    Quit date: 01/10/1991    Years since quitting: 29.0  . Smokeless tobacco: Never Used  Substance Use Topics  . Alcohol use: No  . Drug use: No    Home Medications Prior to Admission medications   Medication Sig Start Date End Date Taking? Authorizing Provider  acetaminophen (TYLENOL) 650 MG CR tablet Take 2 tablets (1,300 mg total) by mouth 2 (two) times daily as needed for pain. 02/12/17  Yes Dena Billet B, PA-C  albuterol (PROVENTIL HFA;VENTOLIN HFA) 108 (90 Base) MCG/ACT inhaler Inhale 1 puff into the lungs every 6 (six) hours as needed for wheezing or shortness of breath. 01/05/18  Yes Dena Billet B, PA-C  atorvastatin (LIPITOR) 40 MG tablet TAKE 1 TABLET BY MOUTH EVERY DAY Patient taking differently: Take 40 mg by mouth daily.  10/04/19  Yes [name], MD  benazepril (LOTENSIN) 20 MG tablet TAKE 1 TABLET BY MOUTH EVERY DAY Patient taking differently: Take  20 mg by mouth daily.  12/22/19  Yes [name], MD  Calcium 500 MG tablet Take 1 tablet (500 mg total) by mouth 3 (three) times daily. Patient taking differently: Take 500 mg by mouth daily.  08/05/16  Yes Orlena Sheldon, PA-C  cholecalciferol (VITAMIN D) 1000 units tablet Take 1 tablet (1,000 Units total) by mouth daily. 08/05/16  Yes Dena Billet B, PA-C  Fluticasone-Salmeterol (ADVAIR DISKUS) 100-50 MCG/DOSE AEPB INHALE 1 PUFF INTO THE LUNGS TWICE A DAY Patient taking differently: Inhale 1 puff into the lungs in the morning and at bedtime.  12/17/19  Yes [name], MD  isosorbide mononitrate (IMDUR) 30 MG 24 hr tablet TAKE 1 TABLET BY MOUTH EVERY DAY Patient taking differently: Take 30 mg by mouth daily.  09/20/19  Yes  [name], MD  omeprazole (PRILOSEC) 20 MG capsule Take 1 capsule (20 mg total) by mouth daily. 02/15/19  Yes [name], MD  polyethylene glycol powder (GLYCOLAX/MIRALAX) powder Take 255 g by mouth 1 day or 1 dose. Patient taking differently: Take 17 g by mouth daily.  08/05/16  Yes Dixon, Stanton Kidney B, PA-C  traMADol (ULTRAM) 50 MG tablet TAKE 2 TABLETS BY MOUTH 3 TIMES A DAY AS NEEDED FOR PAIN Patient taking differently: Take 100 mg by mouth 3 (three) times daily as needed for moderate pain.  11/22/19  Yes [name], MD  fluticasone (FLONASE) 50 MCG/ACT nasal spray Place 2 sprays into both nostrils daily. Patient not taking: Reported on 01/13/2020 08/05/16   Orlena Sheldon, PA-C    Allergies    Patient has no known allergies.  Review of Systems   Review of Systems  Constitutional: Negative for appetite change, chills and fever.  HENT: Negative for congestion.   Eyes: Negative for visual disturbance.  Respiratory: Positive for cough and shortness of breath.   Cardiovascular: Negative for chest pain, palpitations and leg swelling.  Gastrointestinal: Positive for abdominal pain. Negative for nausea and vomiting.  Genitourinary: Negative for dysuria.  Musculoskeletal: Negative for back pain.  Skin: Negative for rash.  Neurological: Negative for dizziness, light-headedness and headaches.  Hematological: Does not bruise/bleed easily.    Physical Exam Updated Vital Signs BP 117/72 (BP Location: Right Arm)   Pulse 80   Temp 98.2 F (36.8 C) (Oral)   Resp 16   Wt 69 kg   SpO2 98%   BMI 26.11 kg/m   Physical Exam Vitals and nursing note reviewed.  Constitutional:      General: She is not in acute distress.    Appearance: Normal appearance. She is well-developed. She is obese. She is not ill-appearing, toxic-appearing or diaphoretic.  HENT:     Head: Normocephalic.     Mouth/Throat:     Mouth: Mucous membranes are moist.  Eyes:     Conjunctiva/sclera: Conjunctivae  normal.     Pupils: Pupils are equal, round, and reactive to light.  Cardiovascular:     Rate and Rhythm: Tachycardia present. Rhythm irregular.  Pulmonary:     Effort: Pulmonary effort is normal.     Breath sounds: No decreased breath sounds, wheezing, rhonchi or rales.  Abdominal:     Tenderness: There is abdominal tenderness (epigastric).  Skin:    General: Skin is dry.  Neurological:     General: No focal deficit present.     Mental Status: She is alert.  Psychiatric:        Mood and Affect: Mood normal.  ED Results / Procedures / Treatments   Labs (all labs ordered are listed, but only abnormal results are displayed) Labs Reviewed  BASIC METABOLIC PANEL - Abnormal; Notable for the following components:      Result Value   Glucose, Bld 137 (*)    BUN 24 (*)    Creatinine, Ser 1.24 (*)    GFR calc non Af Amer 44 (*)    GFR calc Af Amer 51 (*)    All other components within normal limits  CBC - Abnormal; Notable for the following components:   WBC 14.2 (*)    MCV 101.7 (*)    nRBC 0.4 (*)    All other components within normal limits  BRAIN NATRIURETIC PEPTIDE - Abnormal; Notable for the following components:   B Natriuretic Peptide 983.1 (*)    All other components within normal limits  HEPATIC FUNCTION PANEL - Abnormal; Notable for the following components:   Total Protein 6.3 (*)    ALT 57 (*)    Indirect Bilirubin 1.0 (*)    All other components within normal limits  BASIC METABOLIC PANEL - Abnormal; Notable for the following components:   Sodium 134 (*)    Glucose, Bld 125 (*)    Creatinine, Ser 1.17 (*)    Calcium 8.8 (*)    GFR calc non Af Amer 48 (*)    GFR calc Af Amer 55 (*)    All other components within normal limits  CBC - Abnormal; Notable for the following components:   WBC 12.3 (*)    RBC 3.84 (*)    MCV 101.8 (*)    nRBC 0.4 (*)    All other components within normal limits  TROPONIN I (HIGH SENSITIVITY) - Abnormal; Notable for the  following components:   Troponin I (High Sensitivity) 28 (*)    All other components within normal limits  TROPONIN I (HIGH SENSITIVITY) - Abnormal; Notable for the following components:   Troponin I (High Sensitivity) 24 (*)    All other components within normal limits  SARS CORONAVIRUS 2 BY RT PCR (HOSPITAL ORDER, [facility])  TSH  MAGNESIUM  HIV ANTIBODY (ROUTINE TESTING W REFLEX)  MAGNESIUM  TSH  LIPID PANEL  BASIC METABOLIC PANEL    EKG EKG Interpretation  Date/Time:  Thursday Jan 13 2020 13:45:28 EDT Ventricular Rate:  138 PR Interval:    QRS Duration: 88 QT Interval:  316 QTC Calculation: 478 R Axis:   54 Text Interpretation: Atrial fibrillation with rapid ventricular response ST & T wave abnormality, consider inferolateral ischemia Abnormal ECG Confirmed by Davonna Belling [phone]) on 01/13/2020 3:21:02 PM   Radiology DG Chest Port 1 View  Result Date: 01/13/2020 CLINICAL DATA:  Short of breath and upper abdominal pain over the last several days. EXAM: PORTABLE CHEST 1 VIEW COMPARISON:  01/13/2017 FINDINGS: Mild cardiomegaly. Mild aortic atherosclerotic calcification. Pulmonary vascularity is normal. Chronic pleural and parenchymal scarring at the left base. No sign of active infiltrate or mass lesion. No acute bone finding. IMPRESSION: Mild cardiomegaly. Mild aortic atherosclerotic calcification. Chronic pleural and parenchymal scarring at the left base. No active process visible on this one view exam. Electronically Signed   By: Nelson Chimes M.D.   On: 01/13/2020 14:57   ECHOCARDIOGRAM COMPLETE  Result Date: 01/14/2020    ECHOCARDIOGRAM REPORT   Patient Name:   Erin Wong Date of Exam: 01/14/2020 Medical Rec #:  [id number]     Height:  64.0 in Accession #:    [id number]    Weight:       152.1 lb Date of Birth:  [birth year]     BSA:          1.742 m Patient Age:    46 years      BP:           117/72 mmHg Patient Gender: F             HR:            100 bpm. Exam Location:  Inpatient Procedure: 2D Echo and Intracardiac Opacification Agent Indications:     Atrial Fibrillation 427.31 / I48.91  History:         Patient has no prior history of Echocardiogram examinations.                  Signs/Symptoms:Shortness of Breath; Risk Factors:Hypertension                  and Dyslipidemia. Palpitations, Chronic kidney disease, Asthma.  Sonographer:     Darlina Sicilian RDCS Referring Phys:  [id number] [name] Diagnosing Phys: Fransico Him MD IMPRESSIONS  1. Left ventricular ejection fraction, by estimation, is 30 to 35%. The left ventricle has moderately decreased function. The left ventricle demonstrates global hypokinesis. The left ventricular internal cavity size was mildly dilated. Left ventricular diastolic parameters are indeterminate. There is akinesis of the left ventricular, mid-apical inferior wall, anterior wall, inferolateral wall, anterolateral wall and lateral wall. There is akinesis of the left ventricular, apical septal wall and apical segment.  2. Right ventricular systolic function is mildly reduced. The right ventricular size is mildly enlarged. There is moderately elevated pulmonary artery systolic pressure. The estimated right ventricular systolic pressure is [id number] mmHg.  3. Left atrial size was severely dilated.  4. The mitral valve is normal in structure. Mild to moderate mitral valve regurgitation. No evidence of mitral stenosis.  5. Tricuspid valve regurgitation is mild to moderate.  6. The aortic valve is normal in structure. Aortic valve regurgitation is trivial. No aortic stenosis is present.  7. The inferior vena cava is dilated in size with <50% respiratory variability, suggesting right atrial pressure of 15 mmHg. FINDINGS  Left Ventricle: Left ventricular ejection fraction, by estimation, is 30 to 35%. The left ventricle has moderately decreased function. The left ventricle demonstrates global hypokinesis. Definity contrast agent was  given IV to delineate the left ventricular endocardial borders. The left ventricular internal cavity size was mildly dilated. There is no left ventricular hypertrophy. Left ventricular diastolic parameters are indeterminate. Right Ventricle: The right ventricular size is mildly enlarged. No increase in right ventricular wall thickness. Right ventricular systolic function is mildly reduced. There is moderately elevated pulmonary artery systolic pressure. The tricuspid regurgitant velocity is 2.89 m/s, and with an assumed right atrial pressure of 15 mmHg, the estimated right ventricular systolic pressure is [id number] mmHg. Left Atrium: Left atrial size was severely dilated. Right Atrium: Right atrial size was normal in size. Pericardium: There is no evidence of pericardial effusion. Mitral Valve: The mitral valve is normal in structure. Normal mobility of the mitral valve leaflets. Mild to moderate mitral valve regurgitation. No evidence of mitral valve stenosis. Tricuspid Valve: The tricuspid valve is normal in structure. Tricuspid valve regurgitation is mild to moderate. No evidence of tricuspid stenosis. Aortic Valve: The aortic valve is normal in structure. Aortic valve regurgitation is trivial. No aortic stenosis is present. Pulmonic Valve: The pulmonic  valve was normal in structure. Pulmonic valve regurgitation is trivial. No evidence of pulmonic stenosis. Aorta: The aortic root is normal in size and structure. Venous: The inferior vena cava is dilated in size with less than 50% respiratory variability, suggesting right atrial pressure of 15 mmHg. IAS/Shunts: No atrial level shunt detected by color flow Doppler.  LEFT VENTRICLE PLAX 2D LVIDd:         5.50 cm LVIDs:         4.40 cm LV PW:         0.70 cm LV IVS:        1.00 cm LVOT diam:     2.20 cm LV SV:         39 LV SV Index:   22 LVOT Area:     3.80 cm  LV Volumes (MOD) LV vol d, MOD A2C: 103.0 ml LV vol d, MOD A4C: 123.0 ml LV vol s, MOD A2C: 80.5 ml LV vol s,  MOD A4C: 75.7 ml LV SV MOD A2C:     22.5 ml LV SV MOD A4C:     123.0 ml LV SV MOD BP:      35.8 ml RIGHT VENTRICLE TAPSE (M-mode): 1.0 cm LEFT ATRIUM             Index LA diam:        4.70 cm 2.70 cm/m LA Vol (A2C):   82.7 ml 47.49 ml/m LA Vol (A4C):   85.4 ml 49.04 ml/m LA Biplane Vol: 91.4 ml 52.48 ml/m  AORTIC VALVE LVOT Vmax:   64.00 cm/s LVOT Vmean:  43.600 cm/s LVOT VTI:    0.102 m  AORTA Ao Root diam: 3.10 cm Ao Asc diam:  3.50 cm MITRAL VALVE                 TRICUSPID VALVE MV Area (PHT): 5.21 cm      TR Peak grad:   33.4 mmHg MV Decel Time: 146 msec      TR Vmax:        289.00 cm/s MR Peak grad:    87.6 mmHg MR Mean grad:    61.0 mmHg   SHUNTS MR Vmax:         468.00 cm/s Systemic VTI:  0.10 m MR Vmean:        377.0 cm/s  Systemic Diam: 2.20 cm MR PISA:         1.01 cm MR PISA Eff ROA: 7 mm MR PISA Radius:  0.40 cm MV E velocity: 102.63 cm/s Fransico Him MD Electronically signed by Fransico Him MD Signature Date/Time: 01/14/2020/10:19:01 AM    Final (Updated)    US Abdomen Limited RUQ  Result Date: 01/13/2020 CLINICAL DATA:  Elevated liver function tests. EXAM: ULTRASOUND ABDOMEN LIMITED RIGHT UPPER QUADRANT COMPARISON:  None. FINDINGS: Gallbladder: No gallstones or wall thickening visualized. No sonographic Murphy sign noted by sonographer. Common bile duct: Diameter: 5 mm which is within normal limits. Liver: No focal lesion identified. Within normal limits in parenchymal echogenicity. Portal vein is patent on color Doppler imaging with normal direction of blood flow towards the liver. Other: None. IMPRESSION: No definite abnormality seen in the right upper quadrant of the abdomen. Electronically Signed   By: Marijo Conception M.D.   On: 01/13/2020 18:01    Procedures Procedures (including critical care time)  Medications Ordered in ED Medications  atorvastatin (LIPITOR) tablet 40 mg (40 mg Oral Given 01/13/20 1817)  pantoprazole (PROTONIX) EC tablet 40 mg (  40 mg Oral Given 01/14/20 1023)    fluticasone (FLONASE) 50 MCG/ACT nasal spray 2 spray (2 sprays Each Nare Given 01/14/20 1022)  mometasone-formoterol (DULERA) 100-5 MCG/ACT inhaler 2 puff (2 puffs Inhalation Given 01/14/20 0759)  acetaminophen (TYLENOL) tablet 650 mg (has no administration in time range)  ondansetron (ZOFRAN) injection 4 mg (has no administration in time range)  sodium chloride flush (NS) 0.9 % injection 3 mL (3 mLs Intravenous Given 01/14/20 1023)  sodium chloride flush (NS) 0.9 % injection 3 mL (has no administration in time range)  0.9 %  sodium chloride infusion (has no administration in time range)  apixaban (ELIQUIS) tablet 5 mg (5 mg Oral Given 01/14/20 1021)  zolpidem (AMBIEN) tablet 5 mg (has no administration in time range)  ALPRAZolam (XANAX) tablet 0.25 mg (has no administration in time range)  albuterol (PROVENTIL) (2.5 MG/3ML) 0.083% nebulizer solution 2.5 mg (has no administration in time range)  perflutren lipid microspheres (DEFINITY) IV suspension (2 mLs Intravenous Given 01/14/20 0939)  furosemide (LASIX) tablet 20 mg (20 mg Oral Given 01/14/20 1021)  sacubitril-valsartan (ENTRESTO) 24-26 mg per tablet (has no administration in time range)  carvedilol (COREG) tablet 6.25 mg (has no administration in time range)  sodium chloride flush (NS) 0.9 % injection 3 mL (3 mLs Intravenous Given 01/13/20 1456)  diltiazem (CARDIZEM) 1 mg/mL load via infusion 20 mg (20 mg Intravenous Bolus from Bag 01/13/20 1456)    ED Course  I have reviewed the triage vital signs and the nursing notes.  Pertinent labs & imaging results that were available during my care of the patient were reviewed by me and considered in my medical decision making (see chart for details).  Clinical Course as of Jan 14 1115  Thu Jan 13, 2020  1429 New onset afib RVR with rate of 130s-150s starting one week ago. Hemodynamically stable on arrival with complaints of epigastric pressure. Started on dilt bolus and drip.    [KM]  1615 Rate  responded to dilt drip and she is now around 90s-110s, still in afib. Still stable. Consulted with cardiology who will admit    [KM]    Clinical Course User Index [KM] Alveria Apley, PA-C   MDM Rules/Calculators/A&P     CHA2DS2-VASc Score: 3                 CRITICAL CARE Performed by: Alveria Apley   Total critical care time: 30 minutes  Critical care time was exclusive of separately billable procedures and treating other patients.  Critical care was necessary to treat or prevent imminent or life-threatening deterioration.  Critical care was time spent personally by me on the following activities: development of treatment plan with patient and/or surrogate as well as nursing, discussions with consultants, evaluation of patient's response to treatment, examination of patient, obtaining history from patient or surrogate, ordering and performing treatments and interventions, ordering and review of laboratory studies, ordering and review of radiographic studies, pulse oximetry and re-evaluation of patient's condition.  Final Clinical Impression(s) / ED Diagnoses Final diagnoses:  Atrial fibrillation with RVR [facility])    Rx / DC Orders ED Discharge Orders         Ordered    Amb referral to AFIB Clinic     01/13/20 1418           Kristine Royal 01/14/20 1116    Davonna Belling, MD 01/14/20 2337

## 2020-01-14 ENCOUNTER — Inpatient Hospital Stay (HOSPITAL_COMMUNITY): Payer: Medicare Other

## 2020-01-14 ENCOUNTER — Other Ambulatory Visit (HOSPITAL_COMMUNITY): Payer: Medicare Other

## 2020-01-14 DIAGNOSIS — I361 Nonrheumatic tricuspid (valve) insufficiency: Secondary | ICD-10-CM

## 2020-01-14 DIAGNOSIS — I340 Nonrheumatic mitral (valve) insufficiency: Secondary | ICD-10-CM

## 2020-01-14 LAB — LIPID PANEL
Cholesterol: 139 mg/dL (ref 0–200)
HDL: 70 mg/dL (ref >40)
LDL Cholesterol: 57 mg/dL (ref 0–99)
Total CHOL/HDL Ratio: 2 RATIO
Triglycerides: 58 mg/dL (ref <150)
VLDL: 12 mg/dL (ref 0–40)

## 2020-01-14 LAB — BASIC METABOLIC PANEL
Anion gap: 10 (ref 5–15)
Anion gap: 11 (ref 5–15)
BUN: 23 mg/dL (ref 8–23)
BUN: 26 mg/dL — ABNORMAL HIGH (ref 8–23)
CO2: 24 mmol/L (ref 22–32)
CO2: 25 mmol/L (ref 22–32)
Calcium: 8.8 mg/dL — ABNORMAL LOW (ref 8.9–10.3)
Calcium: 9 mg/dL (ref 8.9–10.3)
Chloride: 100 mmol/L (ref 98–111)
Chloride: 100 mmol/L (ref 98–111)
Creatinine, Ser: 1.17 mg/dL — ABNORMAL HIGH (ref 0.44–1.00)
Creatinine, Ser: 1.2 mg/dL — ABNORMAL HIGH (ref 0.44–1.00)
GFR calc Af Amer: 55 mL/min — ABNORMAL LOW (ref >60)
GFR calc Af Amer: 53 mL/min — ABNORMAL LOW (ref >60)
GFR calc non Af Amer: 48 mL/min — ABNORMAL LOW (ref >60)
GFR calc non Af Amer: 46 mL/min — ABNORMAL LOW (ref >60)
Glucose, Bld: 125 mg/dL — ABNORMAL HIGH (ref 70–99)
Glucose, Bld: 154 mg/dL — ABNORMAL HIGH (ref 70–99)
Potassium: 4 mmol/L (ref 3.5–5.1)
Potassium: 4 mmol/L (ref 3.5–5.1)
Sodium: 134 mmol/L — ABNORMAL LOW (ref 135–145)
Sodium: 136 mmol/L (ref 135–145)

## 2020-01-14 LAB — CBC
HCT: 39.1 % (ref 36.0–46.0)
Hemoglobin: 12.1 g/dL (ref 12.0–15.0)
MCH: 31.5 pg (ref 26.0–34.0)
MCHC: 30.9 g/dL (ref 30.0–36.0)
MCV: 101.8 fL — ABNORMAL HIGH (ref 80.0–100.0)
Platelets: 335 10*3/uL (ref 150–400)
RBC: 3.84 MIL/uL — ABNORMAL LOW (ref 3.87–5.11)
RDW: 14.1 % (ref 11.5–15.5)
WBC: 12.3 10*3/uL — ABNORMAL HIGH (ref 4.0–10.5)
nRBC: 0.4 % — ABNORMAL HIGH (ref 0.0–0.2)

## 2020-01-14 LAB — ECHOCARDIOGRAM COMPLETE: Weight: 2433.88 oz

## 2020-01-14 MED ORDER — CARVEDILOL 6.25 MG PO TABS
6.2500 mg | ORAL_TABLET | Freq: Two times a day (BID) | ORAL | Status: DC
  Administered 2020-01-14 – 2020-01-17 (×7): 6.25 mg via ORAL
  Filled 2020-01-14 (×7): qty 1

## 2020-01-14 MED ORDER — DILTIAZEM HCL ER COATED BEADS 180 MG PO CP24
360.0000 mg | ORAL_CAPSULE | Freq: Every day | ORAL | Status: DC
  Administered 2020-01-14: 360 mg via ORAL
  Filled 2020-01-14: qty 2

## 2020-01-14 MED ORDER — FUROSEMIDE 20 MG PO TABS
20.0000 mg | ORAL_TABLET | Freq: Every day | ORAL | Status: DC
  Administered 2020-01-14 – 2020-01-19 (×6): 20 mg via ORAL
  Filled 2020-01-14 (×6): qty 1

## 2020-01-14 MED ORDER — SACUBITRIL-VALSARTAN 24-26 MG PO TABS
1.0000 | ORAL_TABLET | Freq: Two times a day (BID) | ORAL | Status: DC
  Administered 2020-01-14 – 2020-01-19 (×9): 1 via ORAL
  Filled 2020-01-14 (×10): qty 1

## 2020-01-14 MED ORDER — PERFLUTREN LIPID MICROSPHERE
1.0000 mL | INTRAVENOUS | Status: AC | PRN
  Administered 2020-01-14: 2 mL via INTRAVENOUS
  Filled 2020-01-14: qty 10

## 2020-01-14 NOTE — Progress Notes (Signed)
Heart Failure Stewardship Pharmacist Progress Note   PCP: [name], MD PCP-Cardiologist: No primary care provider on file.    HPI:  [name] with PMH significant for chronic knee pain, low risk stress test in 2018, HLD, HTN, asthma, CKD III who was admitted on 5/13 for shortness of breath and palpitations for 1 week.   IV diltiazem was started in the ED for new onset afib RVR and that has now been transitioned to PO.  She had also been receiving furosemide 20 mg IV BID for fluid overload.   ECHO on 01/14/20 with new low LVEF of 30-35% and mildly reduced RV function.   Plan for TEE/DCCV on Monday  Current HF Medications: Furosemide 20 mg daily Isosorbide mononitrate 30 mg daily  Prior to admission HF Medications: Benazepril 20 mg daily Isosorbide mononitrate 30 mg daily  Pertinent Lab Values: . Serum creatinine 1.17, BUN 23, Potassium 4.0, Sodium 134, BNP 983.1, Magnesium 1.8   Vital Signs: . Weight: 152 lbs (estimated dry weight: 150 lbs) . Blood pressure: 110/70s  . Heart rate: 80-100s   Medication Assistance / Insurance Benefits Check: Does the patient have prescription insurance?   yes Type of insurance plan: Medicare - [employer]  Does the patient qualify for medication assistance through manufacturers or grants?   Pending household income information  Eligible grants and/or patient assistance programs: Pending  Medication assistance applications in progress: None  Medication assistance applications approved: None  Approved medication assistance renewals will be completed by: TBD  Outpatient Pharmacy:  Prior to admission outpatient pharmacy: [facility] Is the patient willing to use [facility] at discharge?   TBD Is the patient willing to transition their outpatient pharmacy to utilize a [facility] outpatient pharmacy?   TBD    Assessment: 1. Acute systolic CHF (EF 99991111). NYHA class II symptoms. Volume status improved on MD exam following  IV diuresis. - Continue furosemide 20 mg daily - Consider transitioning diltiazem to carvedilol or metoprolol for rate control with new low EF - Benazepril has been on hold for elevated SCr and some low BP. Consider transitioning to [facility] pending BP tolerability. Last dose of benazepril was 5/13 @ 11 am. - Consider adding spironolactone prior to discharge - Consider SGLT2i at discharge   Plan: 1) Medication changes recommended at this time: - Switch diltiazem to carvedilol or metoprolol for rate control with new low EF  - Add Entresto 24/26 mg BID  2) Patient assistance application(s): - None pending  3)  Education  - To be completed prior to discharge  Vertis Kelch, PharmD, BCPS Heart Failure Stewardship Pharmacist Phone [phone] 01/14/2020       10:42 AM

## 2020-01-14 NOTE — Progress Notes (Signed)
Subjective:  Denies SSCP, palpitations or Dyspnea Good urine output with lasix   Objective:  Vitals:   01/13/20 2029 01/14/20 0040 01/14/20 0523 01/14/20 0644  BP:  107/72 91/69 119/72  Pulse:  (!) 52 (!) 101 93  Resp:  18 18   Temp:  98.5 F (36.9 C) 98.6 F (37 C) 98.5 F (36.9 C)  TempSrc:  Oral Oral Oral  SpO2: 99% 94% 94%   Weight:   69 kg     Intake/Output from previous day:  Intake/Output Summary (Last 24 hours) at 01/14/2020 0750 Last data filed at 01/13/2020 2014 Gross per 24 hour  Intake 150 ml  Output --  Net 150 ml    Physical Exam: Affect appropriate Healthy:  appears stated age HEENT: normal Neck supple with no adenopathy JVP normal no bruits no thyromegaly Lungs clear with no wheezing and good diaphragmatic motion Heart:  S1/S2 no murmur, no rub, gallop or click PMI normal Abdomen: benighn, BS positve, no tenderness, no AAA no bruit.  No HSM or HJR Distal pulses intact with no bruits No edema Neuro non-focal Skin warm and dry No muscular weakness   Lab Results: Basic Metabolic Panel: Recent Labs    01/13/20 1403 01/13/20 2022 01/14/20 0601  NA 135  --  134*  K 4.1  --  4.0  CL 102  --  100  CO2 22  --  24  GLUCOSE 137*  --  125*  BUN 24*  --  23  CREATININE 1.24*  --  1.17*  CALCIUM 9.0  --  8.8*  MG 2.0 1.8  --    Liver Function Tests: Recent Labs    01/13/20 1403  AST 37  ALT 57*  ALKPHOS 75  BILITOT 1.2  PROT 6.3*  ALBUMIN 3.7   No results for input(s): LIPASE, AMYLASE in the last 72 hours. CBC: Recent Labs    01/13/20 1403 01/14/20 0601  WBC 14.2* 12.3*  HGB 13.0 12.1  HCT 41.4 39.1  MCV 101.7* 101.8*  PLT 348 335   Fasting Lipid Panel: Recent Labs    01/14/20 0601  CHOL 139  HDL 70  LDLCALC 57  TRIG 58  CHOLHDL 2.0   Thyroid Function Tests: Recent Labs    01/13/20 2022  TSH 1.525   Anemia Panel: No results for input(s): VITAMINB12, FOLATE, FERRITIN, TIBC, IRON, RETICCTPCT in the last 72  hours.  Imaging: DG Chest Port 1 View  Result Date: 01/13/2020 CLINICAL DATA:  Short of breath and upper abdominal pain over the last several days. EXAM: PORTABLE CHEST 1 VIEW COMPARISON:  01/13/2017 FINDINGS: Mild cardiomegaly. Mild aortic atherosclerotic calcification. Pulmonary vascularity is normal. Chronic pleural and parenchymal scarring at the left base. No sign of active infiltrate or mass lesion. No acute bone finding. IMPRESSION: Mild cardiomegaly. Mild aortic atherosclerotic calcification. Chronic pleural and parenchymal scarring at the left base. No active process visible on this one view exam. Electronically Signed   By: Nelson Chimes M.D.   On: 01/13/2020 14:57   US Abdomen Limited RUQ  Result Date: 01/13/2020 CLINICAL DATA:  Elevated liver function tests. EXAM: ULTRASOUND ABDOMEN LIMITED RIGHT UPPER QUADRANT COMPARISON:  None. FINDINGS: Gallbladder: No gallstones or wall thickening visualized. No sonographic Murphy sign noted by sonographer. Common bile duct: Diameter: 5 mm which is within normal limits. Liver: No focal lesion identified. Within normal limits in parenchymal echogenicity. Portal vein is patent on color Doppler imaging with normal direction of blood flow towards the liver.  Other: None. IMPRESSION: No definite abnormality seen in the right upper quadrant of the abdomen. Electronically Signed   By: Marijo Conception M.D.   On: 01/13/2020 18:01    Cardiac Studies:  ECG:  afib nonspecific ST changes    Telemetry:  afib rates in 80's   Echo: pending   Medications:   . apixaban  5 mg Oral BID  . atorvastatin  40 mg Oral Daily  . fluticasone  2 spray Each Nare Daily  . furosemide  20 mg Intravenous BID  . isosorbide mononitrate  30 mg Oral Daily  . mometasone-formoterol  2 puff Inhalation BID  . pantoprazole  40 mg Oral Daily  . sodium chloride flush  3 mL Intravenous Q12H     . sodium chloride    . diltiazem (CARDIZEM) infusion 12.5 mg/hr (01/14/20 0140)     Assessment/Plan:   1. Afib: new onset started on Eliquis CHADVASC 3 Discussed decreasing ETOH intake labs ok change to PO cardizem K/TSH/Cr ok will put on low dose oral lasix as she presented with mild CHF. Pending d/c today if echo shows normal EF and no bad valves. Consider GDMT and TEE/DCC Monday if her if is bad Jenkins Rouge 01/14/2020, 7:50 AM

## 2020-01-14 NOTE — Progress Notes (Signed)
  Echocardiogram 2D Echocardiogram with definity has been performed.  Erin Wong M 01/14/2020, 9:50 AM

## 2020-01-14 NOTE — Care Management (Signed)
1320 01-14-20 Case Manager received consult for medications Entresto and Farxiga. Benefits check submitted and Case Manager will follow for cost. Graves-Bigelow, Ocie Cornfield, RN,BSN Case Manager

## 2020-01-14 NOTE — Discharge Instructions (Addendum)
 Atrial Fibrillation  Atrial fibrillation is a type of irregular or rapid heartbeat (arrhythmia). In atrial fibrillation, the top part of the heart (atria) beats in an irregular pattern. This makes the heart unable to pump blood normally and effectively. The goal of treatment is to prevent blood clots from forming, control your heart rate, or restore your heartbeat to a normal rhythm. If this condition is not treated, it can cause serious problems, such as a weakened heart muscle (cardiomyopathy) or a stroke. What are the causes? This condition is often caused by medical conditions that damage the heart's electrical system. These include:  High blood pressure (hypertension). This is the most common cause.  Certain heart problems or conditions, such as heart failure, coronary artery disease, heart valve problems, or heart surgery.  Diabetes.  Overactive thyroid (hyperthyroidism).  Obesity.  Chronic kidney disease. In some cases, the cause of this condition is not known. What increases the risk? This condition is more likely to develop in:  Older people.  People who smoke.  Athletes who Erin Wong endurance exercise.  People who have a family history of atrial fibrillation.  Men.  People who use drugs.  People who drink a lot of alcohol.  People who have lung conditions, such as emphysema, pneumonia, or COPD.  People who have obstructive sleep apnea. What are the signs or symptoms? Symptoms of this condition include:  A feeling that your heart is racing or beating irregularly.  Discomfort or pain in your chest.  Shortness of breath.  Sudden light-headedness or weakness.  Tiring easily during exercise or activity.  Fatigue.  Syncope (fainting).  Sweating. In some cases, there are no symptoms. How is this diagnosed? Your health care provider may detect atrial fibrillation when taking your pulse. If detected, this condition may be diagnosed with:  An  electrocardiogram (ECG) to check electrical signals of the heart.  An ambulatory cardiac monitor to record your heart's activity for a few days.  A transthoracic echocardiogram (TTE) to create pictures of your heart.  A transesophageal echocardiogram (TEE) to create even closer pictures of your heart.  A stress test to check your blood supply while you exercise.  Imaging tests, such as a CT scan or chest X-ray.  Blood tests. How is this treated? Treatment depends on underlying conditions and how you feel when you experience atrial fibrillation. This condition may be treated with:  Medicines to prevent blood clots or to treat heart rate or heart rhythm problems.  Electrical cardioversion to reset the heart's rhythm.  A pacemaker to correct abnormal heart rhythm.  Ablation to remove the heart tissue that sends abnormal signals.  Left atrial appendage closure to seal the area where blood clots can form. In some cases, underlying conditions will be treated. Follow these instructions at home: Medicines  Take over-the counter and prescription medicines only as told by your health care provider.  Erin Wong not take any new medicines without talking to your health care provider.  If you are taking blood thinners: ? Talk with your health care provider before you take any medicines that contain aspirin or NSAIDs, such as ibuprofen. These medicines increase your risk for dangerous bleeding. ? Take your medicine exactly as told, at the same time every day. ? Avoid activities that could cause injury or bruising, and follow instructions about how to prevent falls. ? Wear a medical alert bracelet or carry a card that lists what medicines you take. Lifestyle      Erin Wong not use any   products that contain nicotine or tobacco, such as cigarettes, e-cigarettes, and chewing tobacco. If you need help quitting, ask your health care provider.  Eat heart-healthy foods. Talk with a dietitian to make an  eating plan that is right for you.  Exercise regularly as told by your health care provider.  Erin Wong not drink alcohol.  Lose weight if you are overweight.  Erin Wong not use drugs, including cannabis. General instructions  If you have obstructive sleep apnea, manage your condition as told by your health care provider.  Erin Wong not use diet pills unless your health care provider approves. Diet pills can make heart problems worse.  Keep all follow-up visits as told by your health care provider. This is important. Contact a health care provider if you:  Notice a change in the rate, rhythm, or strength of your heartbeat.  Are taking a blood thinner and you notice more bruising.  Tire more easily when you exercise or Erin Wong heavy work.  Have a sudden change in weight. Get help right away if you have:   Chest pain, abdominal pain, sweating, or weakness.  Trouble breathing.  Side effects of blood thinners, such as blood in your vomit, stool, or urine, or bleeding that cannot stop.  Any symptoms of a stroke. "BE FAST" is an easy way to remember the main warning signs of a stroke: ? B - Balance. Signs are dizziness, sudden trouble walking, or loss of balance. ? E - Eyes. Signs are trouble seeing or a sudden change in vision. ? F - Face. Signs are sudden weakness or numbness of the face, or the face or eyelid drooping on one side. ? A - Arms. Signs are weakness or numbness in an arm. This happens suddenly and usually on one side of the body. ? S - Speech. Signs are sudden trouble speaking, slurred speech, or trouble understanding what people say. ? T - Time. Time to call emergency services. Write down what time symptoms started.  Other signs of a stroke, such as: ? A sudden, severe headache with no known cause. ? Nausea or vomiting. ? Seizure. These symptoms may represent a serious problem that is an emergency. Erin Wong not wait to see if the symptoms will go away. Get medical help right away. Call your  local emergency services (911 in the [place] Erin Wong not drive yourself to the hospital. Summary  Atrial fibrillation is a type of irregular or rapid heartbeat (arrhythmia).  Symptoms include a feeling that your heart is beating fast or irregularly.  You may be given medicines to prevent blood clots or to treat heart rate or heart rhythm problems.  Get help right away if you have signs or symptoms of a stroke.  Get help right away if you cannot catch your breath or have chest pain or pressure. This information is not intended to replace advice given to you by your health care provider. Make sure you discuss any questions you have with your health care provider. Document Revised: 02/10/2019 Document Reviewed: 02/10/2019 Elsevier Patient Education  2020 Else[employer] Inc[employer] Information on my medicine - ELIQUIS (apixaban)  Why was Eliquis prescribed for you? Eliquis was prescribed for you to reduce the risk of a blood clot forming that can cause a stroke if you have a medical condition called atrial fibrillation (a type of irregular heartbeat).  What Erin Wong You need to know about Eliquis ? Take your Eliquis TWICE DAILY - one tablet in the morning and one tablet in the evening with or without   food. If you have difficulty swallowing the tablet whole please discuss with your pharmacist how to take the medication safely.  Take Eliquis exactly as prescribed by your doctor and Erin Wong NOT stop taking Eliquis without talking to the doctor who prescribed the medication.  Stopping may increase your risk of developing a stroke.  Refill your prescription before you run out.  After discharge, you should have regular check-up appointments with your healthcare provider that is prescribing your Eliquis.  In the future your dose may need to be changed if your kidney function or weight changes by a significant amount or as you get older.  What Erin Wong you Erin Wong if you miss a dose? If you miss a dose, take it as soon as you  remember on the same day and resume taking twice daily.  Erin Wong not take more than one dose of ELIQUIS at the same time to make up a missed dose.  Important Safety Information A possible side effect of Eliquis is bleeding. You should call your healthcare provider right away if you experience any of the following: ? Bleeding from an injury or your nose that does not stop. ? Unusual colored urine (red or dark brown) or unusual colored stools (red or black). ? Unusual bruising for unknown reasons. ? A serious fall or if you hit your head (even if there is no bleeding).  Some medicines may interact with Eliquis and might increase your risk of bleeding or clotting while on Eliquis. To help avoid this, consult your healthcare provider or pharmacist prior to using any new prescription or non-prescription medications, including herbals, vitamins, non-steroidal anti-inflammatory drugs (NSAIDs) and supplements.  This website has more information on Eliquis (apixaban): http://www.eliquis.com/eliquis/home  

## 2020-01-14 NOTE — TOC Benefit Eligibility Note (Signed)
Transition of Care (TOC) Benefit Eligibility Note    Patient Details  Name: Erin Wong MRN: [id number] Date of Birth: 07/04/1951   Medication/Dose: FARXIGA 5 MG DILY  CO-PAY- $35.00  Covered?: Yes  Tier: 3 Drug  Prescription Coverage [facility]erred [facility]acy: [facility]  and   [facility]  Spoke with Person/Company/Phone Number:: [employer] # [phone]  Co-Pay: $35.00  Prior Approval: No  Deductible: Met(/ OUT-OF-POCKET: UNMET)  Additional Notes: ENTRESTO 24/26 MG BID   COVER- YES  CO-PAY- $35.00  TIER- 3 DRUG  P/A- NO    Greenlee, Dora Phone Number: 01/14/2020, 2:17 PM     

## 2020-01-15 MED ORDER — PSYLLIUM 95 % PO PACK
1.0000 | PACK | Freq: Every day | ORAL | Status: DC
  Administered 2020-01-15 – 2020-01-19 (×5): 1 via ORAL
  Filled 2020-01-15 (×5): qty 1

## 2020-01-15 MED ORDER — SIMETHICONE 80 MG PO CHEW
80.0000 mg | CHEWABLE_TABLET | Freq: Four times a day (QID) | ORAL | Status: DC | PRN
  Administered 2020-01-15 – 2020-01-16 (×3): 80 mg via ORAL
  Filled 2020-01-15 (×3): qty 1

## 2020-01-15 NOTE — Progress Notes (Signed)
Subjective:  Denies SSCP, palpitations or Dyspnea  Primary concern is IBS and /constipation Has had issues since colon surgery   Objective:  Vitals:   01/14/20 2150 01/15/20 0557 01/15/20 0557 01/15/20 0811  BP:   129/74 125/63  Pulse: 94  75 100  Resp: 20  18   Temp:   98.3 F (36.8 C)   TempSrc:   Oral   SpO2: 94%  94%   Weight:  69.4 kg    Height:  5\' 4"  (1.626 m)      Intake/Output from previous day:  Intake/Output Summary (Last 24 hours) at 01/15/2020 1151 Last data filed at 01/15/2020 0600 Gross per 24 hour  Intake 603 ml  Output --  Net 603 ml    Physical Exam: Affect appropriate Healthy:  appears stated age HEENT: normal Neck supple with no adenopathy JVP normal no bruits no thyromegaly Lungs clear with no wheezing and good diaphragmatic motion Heart:  S1/S2 no murmur, no rub, gallop or click PMI normal Abdomen: benighn, BS positve, no tenderness, no AAA no bruit.  No HSM or HJR Distal pulses intact with no bruits No edema Neuro non-focal Skin warm and dry No muscular weakness   Lab Results: Basic Metabolic Panel: Recent Labs    01/13/20 1403 01/13/20 1403 01/13/20 2022 01/14/20 0601 01/14/20 1200  NA 135   < >  --  134* 136  K 4.1   < >  --  4.0 4.0  CL 102   < >  --  100 100  CO2 22   < >  --  24 25  GLUCOSE 137*   < >  --  125* 154*  BUN 24*   < >  --  23 26*  CREATININE 1.24*   < >  --  1.17* 1.20*  CALCIUM 9.0   < >  --  8.8* 9.0  MG 2.0  --  1.8  --   --    < > = values in this interval not displayed.   Liver Function Tests: Recent Labs    01/13/20 1403  AST 37  ALT 57*  ALKPHOS 75  BILITOT 1.2  PROT 6.3*  ALBUMIN 3.7   No results for input(s): LIPASE, AMYLASE in the last 72 hours. CBC: Recent Labs    01/13/20 1403 01/14/20 0601  WBC 14.2* 12.3*  HGB 13.0 12.1  HCT 41.4 39.1  MCV 101.7* 101.8*  PLT 348 335   Fasting Lipid Panel: Recent Labs    01/14/20 0601  CHOL 139  HDL 70  LDLCALC 57  TRIG 58   CHOLHDL 2.0   Thyroid Function Tests: Recent Labs    01/13/20 2022  TSH 1.525   Anemia Panel: No results for input(s): VITAMINB12, FOLATE, FERRITIN, TIBC, IRON, RETICCTPCT in the last 72 hours.  Imaging: DG Chest Port 1 View  Result Date: 01/13/2020 CLINICAL DATA:  Short of breath and upper abdominal pain over the last several days. EXAM: PORTABLE CHEST 1 VIEW COMPARISON:  01/13/2017 FINDINGS: Mild cardiomegaly. Mild aortic atherosclerotic calcification. Pulmonary vascularity is normal. Chronic pleural and parenchymal scarring at the left base. No sign of active infiltrate or mass lesion. No acute bone finding. IMPRESSION: Mild cardiomegaly. Mild aortic atherosclerotic calcification. Chronic pleural and parenchymal scarring at the left base. No active process visible on this one view exam. Electronically Signed   By: Nelson Chimes M.D.   On: 01/13/2020 14:57   ECHOCARDIOGRAM COMPLETE  Result Date: 01/14/2020  ECHOCARDIOGRAM REPORT   Patient Name:   Erin Wong Date of Exam: 01/14/2020 Medical Rec #:  [id number]     Height:       64.0 in Accession #:    [id number]    Weight:       152.1 lb Date of Birth:  December 16, 1950     BSA:          1.742 m Patient Age:    69 years      BP:           117/72 mmHg Patient Gender: F             HR:           100 bpm. Exam Location:  Inpatient Procedure: 2D Echo and Intracardiac Opacification Agent Indications:     Atrial Fibrillation 427.31 / I48.91  History:         Patient has no prior history of Echocardiogram examinations.                  Signs/Symptoms:Shortness of Breath; Risk Factors:Hypertension                  and Dyslipidemia. Palpitations, Chronic kidney disease, Asthma.  Sonographer:     Darlina Sicilian RDCS Referring Phys:  [id number] [name] Diagnosing Phys: Fransico Him MD IMPRESSIONS  1. Left ventricular ejection fraction, by estimation, is 30 to 35%. The left ventricle has moderately decreased function. The left ventricle demonstrates global  hypokinesis. The left ventricular internal cavity size was mildly dilated. Left ventricular diastolic parameters are indeterminate. There is akinesis of the left ventricular, mid-apical inferior wall, anterior wall, inferolateral wall, anterolateral wall and lateral wall. There is akinesis of the left ventricular, apical septal wall and apical segment.  2. Right ventricular systolic function is mildly reduced. The right ventricular size is mildly enlarged. There is moderately elevated pulmonary artery systolic pressure. The estimated right ventricular systolic pressure is [id number] mmHg.  3. Left atrial size was severely dilated.  4. The mitral valve is normal in structure. Mild to moderate mitral valve regurgitation. No evidence of mitral stenosis.  5. Tricuspid valve regurgitation is mild to moderate.  6. The aortic valve is normal in structure. Aortic valve regurgitation is trivial. No aortic stenosis is present.  7. The inferior vena cava is dilated in size with <50% respiratory variability, suggesting right atrial pressure of 15 mmHg. FINDINGS  Left Ventricle: Left ventricular ejection fraction, by estimation, is 30 to 35%. The left ventricle has moderately decreased function. The left ventricle demonstrates global hypokinesis. Definity contrast agent was given IV to delineate the left ventricular endocardial borders. The left ventricular internal cavity size was mildly dilated. There is no left ventricular hypertrophy. Left ventricular diastolic parameters are indeterminate. Right Ventricle: The right ventricular size is mildly enlarged. No increase in right ventricular wall thickness. Right ventricular systolic function is mildly reduced. There is moderately elevated pulmonary artery systolic pressure. The tricuspid regurgitant velocity is 2.89 m/s, and with an assumed right atrial pressure of 15 mmHg, the estimated right ventricular systolic pressure is [id number] mmHg. Left Atrium: Left atrial size was severely  dilated. Right Atrium: Right atrial size was normal in size. Pericardium: There is no evidence of pericardial effusion. Mitral Valve: The mitral valve is normal in structure. Normal mobility of the mitral valve leaflets. Mild to moderate mitral valve regurgitation. No evidence of mitral valve stenosis. Tricuspid Valve: The tricuspid valve is normal in structure. Tricuspid valve regurgitation is  mild to moderate. No evidence of tricuspid stenosis. Aortic Valve: The aortic valve is normal in structure. Aortic valve regurgitation is trivial. No aortic stenosis is present. Pulmonic Valve: The pulmonic valve was normal in structure. Pulmonic valve regurgitation is trivial. No evidence of pulmonic stenosis. Aorta: The aortic root is normal in size and structure. Venous: The inferior vena cava is dilated in size with less than 50% respiratory variability, suggesting right atrial pressure of 15 mmHg. IAS/Shunts: No atrial level shunt detected by color flow Doppler.  LEFT VENTRICLE PLAX 2D LVIDd:         5.50 cm LVIDs:         4.40 cm LV PW:         0.70 cm LV IVS:        1.00 cm LVOT diam:     2.20 cm LV SV:         39 LV SV Index:   22 LVOT Area:     3.80 cm  LV Volumes (MOD) LV vol d, MOD A2C: 103.0 ml LV vol d, MOD A4C: 123.0 ml LV vol s, MOD A2C: 80.5 ml LV vol s, MOD A4C: 75.7 ml LV SV MOD A2C:     22.5 ml LV SV MOD A4C:     123.0 ml LV SV MOD BP:      35.8 ml RIGHT VENTRICLE TAPSE (M-mode): 1.0 cm LEFT ATRIUM             Index LA diam:        4.70 cm 2.70 cm/m LA Vol (A2C):   82.7 ml 47.49 ml/m LA Vol (A4C):   85.4 ml 49.04 ml/m LA Biplane Vol: 91.4 ml 52.48 ml/m  AORTIC VALVE LVOT Vmax:   64.00 cm/s LVOT Vmean:  43.600 cm/s LVOT VTI:    0.102 m  AORTA Ao Root diam: 3.10 cm Ao Asc diam:  3.50 cm MITRAL VALVE                 TRICUSPID VALVE MV Area (PHT): 5.21 cm      TR Peak grad:   33.4 mmHg MV Decel Time: 146 msec      TR Vmax:        289.00 cm/s MR Peak grad:    87.6 mmHg MR Mean grad:    61.0 mmHg   SHUNTS  MR Vmax:         468.00 cm/s Systemic VTI:  0.10 m MR Vmean:        377.0 cm/s  Systemic Diam: 2.20 cm MR PISA:         1.01 cm MR PISA Eff ROA: 7 mm MR PISA Radius:  0.40 cm MV E velocity: 102.63 cm/s Fransico Him MD Electronically signed by Fransico Him MD Signature Date/Time: 01/14/2020/10:19:01 AM    Final (Updated)    US Abdomen Limited RUQ  Result Date: 01/13/2020 CLINICAL DATA:  Elevated liver function tests. EXAM: ULTRASOUND ABDOMEN LIMITED RIGHT UPPER QUADRANT COMPARISON:  None. FINDINGS: Gallbladder: No gallstones or wall thickening visualized. No sonographic Murphy sign noted by sonographer. Common bile duct: Diameter: 5 mm which is within normal limits. Liver: No focal lesion identified. Within normal limits in parenchymal echogenicity. Portal vein is patent on color Doppler imaging with normal direction of blood flow towards the liver. Other: None. IMPRESSION: No definite abnormality seen in the right upper quadrant of the abdomen. Electronically Signed   By: Marijo Conception M.D.   On: 01/13/2020 18:01  Cardiac Studies:  ECG:  afib nonspecific ST changes    Telemetry:  afib rates in 80's   Echo:  EF  30-35% severe LAE mild to moderate MR   Medications:   . apixaban  5 mg Oral BID  . atorvastatin  40 mg Oral Daily  . carvedilol  6.25 mg Oral BID WC  . fluticasone  2 spray Each Nare Daily  . furosemide  20 mg Oral Daily  . mometasone-formoterol  2 puff Inhalation BID  . pantoprazole  40 mg Oral Daily  . sacubitril-valsartan  1 tablet Oral BID  . sodium chloride flush  3 mL Intravenous Q12H     . sodium chloride      Assessment/Plan:   1. Afib: new onset started on Eliquis CHADVASC 3 Given decreased EF plan for TEE/DCC with me on Monday Procedure and risks discussed willing to proceed. Rate control good with coreg. For CHF continue lasix and entresto. Will need ischemic w/u once NSR restored Echo had Takatsubo like appearance. Hopefully with restoration of NSR and  decreased LV size MR will lessen as well Will give metamucil for constipation   Jenkins Rouge 01/15/2020, 11:51 AM

## 2020-01-15 NOTE — Plan of Care (Signed)

## 2020-01-15 NOTE — Progress Notes (Signed)
It was reported that the pt had a 7 beat run of vtach. On assessment pt was not experiencing any chest pain and VSS. MD aware.

## 2020-01-16 LAB — BASIC METABOLIC PANEL
Anion gap: 11 (ref 5–15)
BUN: 19 mg/dL (ref 8–23)
CO2: 26 mmol/L (ref 22–32)
Calcium: 9 mg/dL (ref 8.9–10.3)
Chloride: 95 mmol/L — ABNORMAL LOW (ref 98–111)
Creatinine, Ser: 1.3 mg/dL — ABNORMAL HIGH (ref 0.44–1.00)
GFR calc Af Amer: 48 mL/min — ABNORMAL LOW (ref >60)
GFR calc non Af Amer: 42 mL/min — ABNORMAL LOW (ref >60)
Glucose, Bld: 148 mg/dL — ABNORMAL HIGH (ref 70–99)
Potassium: 4.4 mmol/L (ref 3.5–5.1)
Sodium: 132 mmol/L — ABNORMAL LOW (ref 135–145)

## 2020-01-16 MED ORDER — SODIUM CHLORIDE 0.9 % IV SOLN: INTRAVENOUS | Status: DC

## 2020-01-16 NOTE — H&P (View-Only) (Signed)
Subjective:  Some fatigue Rates up when ambulating   Objective:  Vitals:   01/16/20 0535 01/16/20 0559 01/16/20 0739 01/16/20 0850  BP: 119/66 107/68  110/70  Pulse: (!) 122 60  (!) 108  Resp: (!) 22 18  16   Temp: 98.4 F (36.9 C) 98.6 F (37 C)  98 F (36.7 C)  TempSrc: Oral Oral    SpO2: 99% 95% 95% 95%  Weight: 70 kg     Height:        Intake/Output from previous day:  Intake/Output Summary (Last 24 hours) at 01/16/2020 0908 Last data filed at 01/15/2020 2200 Gross per 24 hour  Intake 3 ml  Output --  Net 3 ml    Physical Exam: Affect appropriate Healthy:  appears stated age HEENT: normal Neck supple with no adenopathy JVP normal no bruits no thyromegaly Lungs clear with no wheezing and good diaphragmatic motion Heart:  S1/S2 no murmur, no rub, gallop or click PMI normal Abdomen: benighn, BS positve, no tenderness, no AAA no bruit.  No HSM or HJR Distal pulses intact with no bruits No edema Neuro non-focal Skin warm and dry No muscular weakness   Lab Results: Basic Metabolic Panel: Recent Labs    01/13/20 1403 01/13/20 1403 01/13/20 2022 01/14/20 0601 01/14/20 1200  NA 135   < >  --  134* 136  K 4.1   < >  --  4.0 4.0  CL 102   < >  --  100 100  CO2 22   < >  --  24 25  GLUCOSE 137*   < >  --  125* 154*  BUN 24*   < >  --  23 26*  CREATININE 1.24*   < >  --  1.17* 1.20*  CALCIUM 9.0   < >  --  8.8* 9.0  MG 2.0  --  1.8  --   --    < > = values in this interval not displayed.   Liver Function Tests: Recent Labs    01/13/20 1403  AST 37  ALT 57*  ALKPHOS 75  BILITOT 1.2  PROT 6.3*  ALBUMIN 3.7   No results for input(s): LIPASE, AMYLASE in the last 72 hours. CBC: Recent Labs    01/13/20 1403 01/14/20 0601  WBC 14.2* 12.3*  HGB 13.0 12.1  HCT 41.4 39.1  MCV 101.7* 101.8*  PLT 348 335   Fasting Lipid Panel: Recent Labs    01/14/20 0601  CHOL 139  HDL 70  LDLCALC 57  TRIG 58  CHOLHDL 2.0   Thyroid Function  Tests: Recent Labs    01/13/20 2022  TSH 1.525   Anemia Panel: No results for input(s): VITAMINB12, FOLATE, FERRITIN, TIBC, IRON, RETICCTPCT in the last 72 hours.  Imaging: ECHOCARDIOGRAM COMPLETE  Result Date: 01/14/2020    ECHOCARDIOGRAM REPORT   Patient Name:   Erin Wong Date of Exam: 01/14/2020 Medical Rec #:  [id number]     Height:       64.0 in Accession #:    [id number]    Weight:       152.1 lb Date of Birth:  1951/05/24     BSA:          1.742 m Patient Age:    69 years      BP:           117/72 mmHg Patient Gender: F  HR:           100 bpm. Exam Location:  Inpatient Procedure: 2D Echo and Intracardiac Opacification Agent Indications:     Atrial Fibrillation 427.31 / I48.91  History:         Patient has no prior history of Echocardiogram examinations.                  Signs/Symptoms:Shortness of Breath; Risk Factors:Hypertension                  and Dyslipidemia. Palpitations, Chronic kidney disease, Asthma.  Sonographer:     Darlina Sicilian RDCS Referring Phys:  [id number] [name] Diagnosing Phys: Fransico Him MD IMPRESSIONS  1. Left ventricular ejection fraction, by estimation, is 30 to 35%. The left ventricle has moderately decreased function. The left ventricle demonstrates global hypokinesis. The left ventricular internal cavity size was mildly dilated. Left ventricular diastolic parameters are indeterminate. There is akinesis of the left ventricular, mid-apical inferior wall, anterior wall, inferolateral wall, anterolateral wall and lateral wall. There is akinesis of the left ventricular, apical septal wall and apical segment.  2. Right ventricular systolic function is mildly reduced. The right ventricular size is mildly enlarged. There is moderately elevated pulmonary artery systolic pressure. The estimated right ventricular systolic pressure is [id number] mmHg.  3. Left atrial size was severely dilated.  4. The mitral valve is normal in structure. Mild to moderate mitral valve  regurgitation. No evidence of mitral stenosis.  5. Tricuspid valve regurgitation is mild to moderate.  6. The aortic valve is normal in structure. Aortic valve regurgitation is trivial. No aortic stenosis is present.  7. The inferior vena cava is dilated in size with <50% respiratory variability, suggesting right atrial pressure of 15 mmHg. FINDINGS  Left Ventricle: Left ventricular ejection fraction, by estimation, is 30 to 35%. The left ventricle has moderately decreased function. The left ventricle demonstrates global hypokinesis. Definity contrast agent was given IV to delineate the left ventricular endocardial borders. The left ventricular internal cavity size was mildly dilated. There is no left ventricular hypertrophy. Left ventricular diastolic parameters are indeterminate. Right Ventricle: The right ventricular size is mildly enlarged. No increase in right ventricular wall thickness. Right ventricular systolic function is mildly reduced. There is moderately elevated pulmonary artery systolic pressure. The tricuspid regurgitant velocity is 2.89 m/s, and with an assumed right atrial pressure of 15 mmHg, the estimated right ventricular systolic pressure is [id number] mmHg. Left Atrium: Left atrial size was severely dilated. Right Atrium: Right atrial size was normal in size. Pericardium: There is no evidence of pericardial effusion. Mitral Valve: The mitral valve is normal in structure. Normal mobility of the mitral valve leaflets. Mild to moderate mitral valve regurgitation. No evidence of mitral valve stenosis. Tricuspid Valve: The tricuspid valve is normal in structure. Tricuspid valve regurgitation is mild to moderate. No evidence of tricuspid stenosis. Aortic Valve: The aortic valve is normal in structure. Aortic valve regurgitation is trivial. No aortic stenosis is present. Pulmonic Valve: The pulmonic valve was normal in structure. Pulmonic valve regurgitation is trivial. No evidence of pulmonic stenosis.  Aorta: The aortic root is normal in size and structure. Venous: The inferior vena cava is dilated in size with less than 50% respiratory variability, suggesting right atrial pressure of 15 mmHg. IAS/Shunts: No atrial level shunt detected by color flow Doppler.  LEFT VENTRICLE PLAX 2D LVIDd:         5.50 cm LVIDs:  4.40 cm LV PW:         0.70 cm LV IVS:        1.00 cm LVOT diam:     2.20 cm LV SV:         39 LV SV Index:   22 LVOT Area:     3.80 cm  LV Volumes (MOD) LV vol d, MOD A2C: 103.0 ml LV vol d, MOD A4C: 123.0 ml LV vol s, MOD A2C: 80.5 ml LV vol s, MOD A4C: 75.7 ml LV SV MOD A2C:     22.5 ml LV SV MOD A4C:     123.0 ml LV SV MOD BP:      35.8 ml RIGHT VENTRICLE TAPSE (M-mode): 1.0 cm LEFT ATRIUM             Index LA diam:        4.70 cm 2.70 cm/m LA Vol (A2C):   82.7 ml 47.49 ml/m LA Vol (A4C):   85.4 ml 49.04 ml/m LA Biplane Vol: 91.4 ml 52.48 ml/m  AORTIC VALVE LVOT Vmax:   64.00 cm/s LVOT Vmean:  43.600 cm/s LVOT VTI:    0.102 m  AORTA Ao Root diam: 3.10 cm Ao Asc diam:  3.50 cm MITRAL VALVE                 TRICUSPID VALVE MV Area (PHT): 5.21 cm      TR Peak grad:   33.4 mmHg MV Decel Time: 146 msec      TR Vmax:        289.00 cm/s MR Peak grad:    87.6 mmHg MR Mean grad:    61.0 mmHg   SHUNTS MR Vmax:         468.00 cm/s Systemic VTI:  0.10 m MR Vmean:        377.0 cm/s  Systemic Diam: 2.20 cm MR PISA:         1.01 cm MR PISA Eff ROA: 7 mm MR PISA Radius:  0.40 cm MV E velocity: 102.63 cm/s Fransico Him MD Electronically signed by Fransico Him MD Signature Date/Time: 01/14/2020/10:19:01 AM    Final (Updated)     Cardiac Studies:  ECG:  afib nonspecific ST changes    Telemetry:  afib rates in 80's but over 120 sitting up   Echo:  EF  30-35% severe LAE mild to moderate MR   Medications:   . apixaban  5 mg Oral BID  . atorvastatin  40 mg Oral Daily  . carvedilol  6.25 mg Oral BID WC  . fluticasone  2 spray Each Nare Daily  . furosemide  20 mg Oral Daily  . mometasone-formoterol   2 puff Inhalation BID  . pantoprazole  40 mg Oral Daily  . psyllium  1 packet Oral Daily  . sacubitril-valsartan  1 tablet Oral BID  . sodium chloride flush  3 mL Intravenous Q12H     . sodium chloride      Assessment/Plan:   1. Afib: new onset started on Eliquis CHADVASC 3 Given decreased EF plan for TEE/DCC with me on Monday Procedure and risks discussed willing to proceed. Rate control ok with coreg. For CHF continue lasix and entresto. Will need ischemic w/u once NSR restored Echo had Takatsubo like appearance. Hopefully with restoration of NSR and decreased LV size MR will lessen as well Metamucil given for constipation Check BMET in am  Jenkins Rouge 01/16/2020, 9:08 AM

## 2020-01-16 NOTE — Progress Notes (Signed)
Subjective:  Some fatigue Rates up when ambulating   Objective:  Vitals:   01/16/20 0535 01/16/20 0559 01/16/20 0739 01/16/20 0850  BP: 119/66 107/68  110/70  Pulse: (!) 122 60  (!) 108  Resp: (!) 22 18  16   Temp: 98.4 F (36.9 C) 98.6 F (37 C)  98 F (36.7 C)  TempSrc: Oral Oral    SpO2: 99% 95% 95% 95%  Weight: 70 kg     Height:        Intake/Output from previous day:  Intake/Output Summary (Last 24 hours) at 01/16/2020 0908 Last data filed at 01/15/2020 2200 Gross per 24 hour  Intake 3 ml  Output --  Net 3 ml    Physical Exam: Affect appropriate Healthy:  appears stated age HEENT: normal Neck supple with no adenopathy JVP normal no bruits no thyromegaly Lungs clear with no wheezing and good diaphragmatic motion Heart:  S1/S2 no murmur, no rub, gallop or click PMI normal Abdomen: benighn, BS positve, no tenderness, no AAA no bruit.  No HSM or HJR Distal pulses intact with no bruits No edema Neuro non-focal Skin warm and dry No muscular weakness   Lab Results: Basic Metabolic Panel: Recent Labs    01/13/20 1403 01/13/20 1403 01/13/20 2022 01/14/20 0601 01/14/20 1200  NA 135   < >  --  134* 136  K 4.1   < >  --  4.0 4.0  CL 102   < >  --  100 100  CO2 22   < >  --  24 25  GLUCOSE 137*   < >  --  125* 154*  BUN 24*   < >  --  23 26*  CREATININE 1.24*   < >  --  1.17* 1.20*  CALCIUM 9.0   < >  --  8.8* 9.0  MG 2.0  --  1.8  --   --    < > = values in this interval not displayed.   Liver Function Tests: Recent Labs    01/13/20 1403  AST 37  ALT 57*  ALKPHOS 75  BILITOT 1.2  PROT 6.3*  ALBUMIN 3.7   No results for input(s): LIPASE, AMYLASE in the last 72 hours. CBC: Recent Labs    01/13/20 1403 01/14/20 0601  WBC 14.2* 12.3*  HGB 13.0 12.1  HCT 41.4 39.1  MCV 101.7* 101.8*  PLT 348 335   Fasting Lipid Panel: Recent Labs    01/14/20 0601  CHOL 139  HDL 70  LDLCALC 57  TRIG 58  CHOLHDL 2.0   Thyroid Function  Tests: Recent Labs    01/13/20 2022  TSH 1.525   Anemia Panel: No results for input(s): VITAMINB12, FOLATE, FERRITIN, TIBC, IRON, RETICCTPCT in the last 72 hours.  Imaging: ECHOCARDIOGRAM COMPLETE  Result Date: 01/14/2020    ECHOCARDIOGRAM REPORT   Patient Name:   Erin Wong Date of Exam: 01/14/2020 Medical Rec #:  [id number]     Height:       64.0 in Accession #:    [id number]    Weight:       152.1 lb Date of Birth:  11/12/1950     BSA:          1.742 m Patient Age:    69 years      BP:           117/72 mmHg Patient Gender: F  HR:           100 bpm. Exam Location:  Inpatient Procedure: 2D Echo and Intracardiac Opacification Agent Indications:     Atrial Fibrillation 427.31 / I48.91  History:         Patient has no prior history of Echocardiogram examinations.                  Signs/Symptoms:Shortness of Breath; Risk Factors:Hypertension                  and Dyslipidemia. Palpitations, Chronic kidney disease, Asthma.  Sonographer:     Darlina Sicilian RDCS Referring Phys:  [id number] [name] Diagnosing Phys: Fransico Him MD IMPRESSIONS  1. Left ventricular ejection fraction, by estimation, is 30 to 35%. The left ventricle has moderately decreased function. The left ventricle demonstrates global hypokinesis. The left ventricular internal cavity size was mildly dilated. Left ventricular diastolic parameters are indeterminate. There is akinesis of the left ventricular, mid-apical inferior wall, anterior wall, inferolateral wall, anterolateral wall and lateral wall. There is akinesis of the left ventricular, apical septal wall and apical segment.  2. Right ventricular systolic function is mildly reduced. The right ventricular size is mildly enlarged. There is moderately elevated pulmonary artery systolic pressure. The estimated right ventricular systolic pressure is [id number] mmHg.  3. Left atrial size was severely dilated.  4. The mitral valve is normal in structure. Mild to moderate mitral valve  regurgitation. No evidence of mitral stenosis.  5. Tricuspid valve regurgitation is mild to moderate.  6. The aortic valve is normal in structure. Aortic valve regurgitation is trivial. No aortic stenosis is present.  7. The inferior vena cava is dilated in size with <50% respiratory variability, suggesting right atrial pressure of 15 mmHg. FINDINGS  Left Ventricle: Left ventricular ejection fraction, by estimation, is 30 to 35%. The left ventricle has moderately decreased function. The left ventricle demonstrates global hypokinesis. Definity contrast agent was given IV to delineate the left ventricular endocardial borders. The left ventricular internal cavity size was mildly dilated. There is no left ventricular hypertrophy. Left ventricular diastolic parameters are indeterminate. Right Ventricle: The right ventricular size is mildly enlarged. No increase in right ventricular wall thickness. Right ventricular systolic function is mildly reduced. There is moderately elevated pulmonary artery systolic pressure. The tricuspid regurgitant velocity is 2.89 m/s, and with an assumed right atrial pressure of 15 mmHg, the estimated right ventricular systolic pressure is [id number] mmHg. Left Atrium: Left atrial size was severely dilated. Right Atrium: Right atrial size was normal in size. Pericardium: There is no evidence of pericardial effusion. Mitral Valve: The mitral valve is normal in structure. Normal mobility of the mitral valve leaflets. Mild to moderate mitral valve regurgitation. No evidence of mitral valve stenosis. Tricuspid Valve: The tricuspid valve is normal in structure. Tricuspid valve regurgitation is mild to moderate. No evidence of tricuspid stenosis. Aortic Valve: The aortic valve is normal in structure. Aortic valve regurgitation is trivial. No aortic stenosis is present. Pulmonic Valve: The pulmonic valve was normal in structure. Pulmonic valve regurgitation is trivial. No evidence of pulmonic stenosis.  Aorta: The aortic root is normal in size and structure. Venous: The inferior vena cava is dilated in size with less than 50% respiratory variability, suggesting right atrial pressure of 15 mmHg. IAS/Shunts: No atrial level shunt detected by color flow Doppler.  LEFT VENTRICLE PLAX 2D LVIDd:         5.50 cm LVIDs:  4.40 cm LV PW:         0.70 cm LV IVS:        1.00 cm LVOT diam:     2.20 cm LV SV:         39 LV SV Index:   22 LVOT Area:     3.80 cm  LV Volumes (MOD) LV vol d, MOD A2C: 103.0 ml LV vol d, MOD A4C: 123.0 ml LV vol s, MOD A2C: 80.5 ml LV vol s, MOD A4C: 75.7 ml LV SV MOD A2C:     22.5 ml LV SV MOD A4C:     123.0 ml LV SV MOD BP:      35.8 ml RIGHT VENTRICLE TAPSE (M-mode): 1.0 cm LEFT ATRIUM             Index LA diam:        4.70 cm 2.70 cm/m LA Vol (A2C):   82.7 ml 47.49 ml/m LA Vol (A4C):   85.4 ml 49.04 ml/m LA Biplane Vol: 91.4 ml 52.48 ml/m  AORTIC VALVE LVOT Vmax:   64.00 cm/s LVOT Vmean:  43.600 cm/s LVOT VTI:    0.102 m  AORTA Ao Root diam: 3.10 cm Ao Asc diam:  3.50 cm MITRAL VALVE                 TRICUSPID VALVE MV Area (PHT): 5.21 cm      TR Peak grad:   33.4 mmHg MV Decel Time: 146 msec      TR Vmax:        289.00 cm/s MR Peak grad:    87.6 mmHg MR Mean grad:    61.0 mmHg   SHUNTS MR Vmax:         468.00 cm/s Systemic VTI:  0.10 m MR Vmean:        377.0 cm/s  Systemic Diam: 2.20 cm MR PISA:         1.01 cm MR PISA Eff ROA: 7 mm MR PISA Radius:  0.40 cm MV E velocity: 102.63 cm/s Fransico Him MD Electronically signed by Fransico Him MD Signature Date/Time: 01/14/2020/10:19:01 AM    Final (Updated)     Cardiac Studies:  ECG:  afib nonspecific ST changes    Telemetry:  afib rates in 80's but over 120 sitting up   Echo:  EF  30-35% severe LAE mild to moderate MR   Medications:   . apixaban  5 mg Oral BID  . atorvastatin  40 mg Oral Daily  . carvedilol  6.25 mg Oral BID WC  . fluticasone  2 spray Each Nare Daily  . furosemide  20 mg Oral Daily  . mometasone-formoterol   2 puff Inhalation BID  . pantoprazole  40 mg Oral Daily  . psyllium  1 packet Oral Daily  . sacubitril-valsartan  1 tablet Oral BID  . sodium chloride flush  3 mL Intravenous Q12H     . sodium chloride      Assessment/Plan:   1. Afib: new onset started on Eliquis CHADVASC 3 Given decreased EF plan for TEE/DCC with me on Monday Procedure and risks discussed willing to proceed. Rate control ok with coreg. For CHF continue lasix and entresto. Will need ischemic w/u once NSR restored Echo had Takatsubo like appearance. Hopefully with restoration of NSR and decreased LV size MR will lessen as well Metamucil given for constipation Check BMET in am  Jenkins Rouge 01/16/2020, 9:08 AM

## 2020-01-16 NOTE — Progress Notes (Signed)
   01/16/20 0850  Assess: MEWS Score  Temp 98 F (36.7 C)  BP 110/70  Pulse Rate (!) 108  ECG Heart Rate (!) 144  Resp 16  Level of Consciousness Alert  SpO2 95 %  O2 Device Room Air  Assess: MEWS Score  MEWS Temp 0  MEWS Systolic 0  MEWS Pulse 3  MEWS RR 0  MEWS LOC 0  MEWS Score 3  MEWS Score Color Yellow  Assess: if the MEWS score is Yellow or Red  Were vital signs taken at a resting state? No  Focused Assessment Documented focused assessment  Early Detection of Sepsis Score *See Row Information* Low  MEWS guidelines implemented *See Row Information* Yes  Treat  MEWS Interventions Administered scheduled meds/treatments  Take Vital Signs  Increase Vital Sign Frequency  Yellow: Q 2hr X 2 then Q 4hr X 2, if remains yellow, continue Q 4hrs  Escalate  MEWS: Escalate Yellow: discuss with charge nurse/RN and consider discussing with provider and RRT  Notify: Provider  Provider Name/Title Dr. Johnsie Cancel  Date Provider Notified 01/16/20  Time Provider Notified 0900  Notification Type Face-to-face  Notification Reason Other (Comment) (per protocol)  Response No new orders  Date of Provider Response 01/16/20  Time of Provider Response 0900  Document  Patient Outcome Stabilized after interventions;Other (Comment) (HR has been elevated d/t AFib w/ RVR. MD aware. DCCV planned)  Progress note created (see row info) Yes

## 2020-01-17 ENCOUNTER — Inpatient Hospital Stay (HOSPITAL_COMMUNITY): Payer: Medicare Other

## 2020-01-17 ENCOUNTER — Encounter (HOSPITAL_COMMUNITY)
Admission: EM | Disposition: A | Discharge status: Home / Self Care | Payer: Self-pay | Source: Home / Self Care | Attending: Cardiovascular Disease

## 2020-01-17 ENCOUNTER — Inpatient Hospital Stay (HOSPITAL_COMMUNITY): Payer: Medicare Other | Admitting: Certified Registered Nurse Anesthetist

## 2020-01-17 ENCOUNTER — Encounter (HOSPITAL_COMMUNITY): Payer: Self-pay | Admitting: Cardiovascular Disease

## 2020-01-17 DIAGNOSIS — I4891 Unspecified atrial fibrillation: Secondary | ICD-10-CM

## 2020-01-17 DIAGNOSIS — I340 Nonrheumatic mitral (valve) insufficiency: Secondary | ICD-10-CM

## 2020-01-17 DIAGNOSIS — I5021 Acute systolic (congestive) heart failure: Secondary | ICD-10-CM

## 2020-01-17 HISTORY — PX: TEE WITHOUT CARDIOVERSION: SHX5443

## 2020-01-17 HISTORY — PX: CARDIOVERSION: SHX1299

## 2020-01-17 SURGERY — ECHOCARDIOGRAM, TRANSESOPHAGEAL
Anesthesia: General

## 2020-01-17 MED ORDER — AMIODARONE HCL IN DEXTROSE 360-4.14 MG/200ML-% IV SOLN
30.0000 mg/h | INTRAVENOUS | Status: DC
  Administered 2020-01-17 – 2020-01-18 (×2): 30 mg/h via INTRAVENOUS
  Filled 2020-01-17: qty 200

## 2020-01-17 MED ORDER — CARVEDILOL 12.5 MG PO TABS
12.5000 mg | ORAL_TABLET | Freq: Two times a day (BID) | ORAL | Status: DC
  Administered 2020-01-18 – 2020-01-19 (×3): 12.5 mg via ORAL
  Filled 2020-01-17 (×3): qty 1

## 2020-01-17 MED ORDER — SODIUM CHLORIDE 0.9 % IV SOLN: INTRAVENOUS | Status: DC | PRN

## 2020-01-17 MED ORDER — AMIODARONE LOAD VIA INFUSION
150.0000 mg | Freq: Once | INTRAVENOUS | Status: DC
  Filled 2020-01-17: qty 83.34

## 2020-01-17 MED ORDER — PROPOFOL 500 MG/50ML IV EMUL
INTRAVENOUS | Status: DC | PRN
  Administered 2020-01-17: 125 ug/kg/min via INTRAVENOUS

## 2020-01-17 MED ORDER — METOPROLOL TARTRATE 5 MG/5ML IV SOLN
INTRAVENOUS | Status: AC
  Filled 2020-01-17: qty 5

## 2020-01-17 MED ORDER — PROPOFOL 10 MG/ML IV BOLUS
INTRAVENOUS | Status: DC | PRN
  Administered 2020-01-17: 50 mg via INTRAVENOUS

## 2020-01-17 MED ORDER — ALBUMIN HUMAN 5 % IV SOLN
INTRAVENOUS | Status: DC | PRN
Start: 2020-01-17 — End: 2020-01-17

## 2020-01-17 MED ORDER — PHENYLEPHRINE 40 MCG/ML (10ML) SYRINGE FOR IV PUSH (FOR BLOOD PRESSURE SUPPORT)
PREFILLED_SYRINGE | INTRAVENOUS | Status: DC | PRN
  Administered 2020-01-17: 160 ug via INTRAVENOUS

## 2020-01-17 MED ORDER — AMIODARONE HCL IN DEXTROSE 360-4.14 MG/200ML-% IV SOLN
60.0000 mg/h | INTRAVENOUS | Status: AC
  Administered 2020-01-17 (×2): 60 mg/h via INTRAVENOUS
  Filled 2020-01-17: qty 200

## 2020-01-17 MED ORDER — AMIODARONE LOAD VIA INFUSION
150.0000 mg | Freq: Once | INTRAVENOUS | Status: AC
  Administered 2020-01-17: 150 mg via INTRAVENOUS

## 2020-01-17 NOTE — CV Procedure (Signed)
TEE/DCC:  EF 30-35% in rapid afib Moderate MR RV dilated and moderate dysfuction Moderate bi atrial enlargement no LAA thrombus No ASD/PFO No effusion  DCC x 1  Afib rate 144-> NSR rate 90-102 with frequent and consecutive PAC;s BP low 0000000 mmHg systolic  Will give iv amiodarone to try to settle rhythm down She received am Coreg May need bolus short neo to support BP as anesthesia wears off  Discussed with Dr Marlou Porch who is following patient this week  Jenkins Rouge MD [facility]

## 2020-01-17 NOTE — Progress Notes (Signed)
*  PRELIMINARY RESULTS* Echocardiogram Echocardiogram Transesophageal has been performed.  Matilde Bash 01/17/2020, 2:17 PM

## 2020-01-17 NOTE — Anesthesia Postprocedure Evaluation (Signed)
Anesthesia Post Note  Patient: Erin Wong  Procedure(s) Performed: TRANSESOPHAGEAL ECHOCARDIOGRAM (TEE) (N/A ) CARDIOVERSION (N/A )     Patient location during evaluation: PACU Anesthesia Type: General Level of consciousness: awake Pain management: pain level controlled Vital Signs Assessment: post-procedure vital signs reviewed and stable Respiratory status: spontaneous breathing Cardiovascular status: stable Anesthetic complications: no    Last Vitals:  Vitals:   01/17/20 1505 01/17/20 1515  BP: 113/73 110/84  Pulse:  69  Resp: 17 20  Temp:    SpO2:  95%    Last Pain:  Vitals:   01/17/20 1515  TempSrc:   PainSc: 0-No pain                 Tyrell Brereton

## 2020-01-17 NOTE — Progress Notes (Signed)
  Amiodarone Drug - Drug Interaction Consult Note  Recommendations: - Monitor for myopathy with concurrent atorvastatin - Monitor for increased risk of bradycardia with concurrent carvedilol - Monitor for increased effect of Apixaban - monitor for bleeding - Avoid hypokalemia while on amiodarone and concurrent furosemide  Amiodarone is metabolized by the cytochrome P450 system and therefore has the potential to cause many drug interactions. Amiodarone has an average plasma half-life of 50 days (range 20 to 100 days).   There is potential for drug interactions to occur several weeks or months after stopping treatment and the onset of drug interactions may be slow after initiating amiodarone.   [x]  Statins: Increased risk of myopathy. Simvastatin- restrict dose to 20mg  daily. Other statins: counsel patients to report any muscle pain or weakness immediately.  [x]  Anticoagulants: Amiodarone can increase anticoagulant effect. Consider warfarin dose reduction. Patients should be monitored closely and the dose of anticoagulant altered accordingly, remembering that amiodarone levels take several weeks to stabilize.  []  Antiepileptics: Amiodarone can increase plasma concentration of phenytoin, the dose should be reduced. Note that small changes in phenytoin dose can result in large changes in levels. Monitor patient and counsel on signs of toxicity.  [x]  Beta blockers: increased risk of bradycardia, AV block and myocardial depression. Sotalol - avoid concomitant use.  []   Calcium channel blockers (diltiazem and verapamil): increased risk of bradycardia, AV block and myocardial depression.  []   Cyclosporine: Amiodarone increases levels of cyclosporine. Reduced dose of cyclosporine is recommended.  []  Digoxin dose should be halved when amiodarone is started.  [x]  Diuretics: increased risk of cardiotoxicity if hypokalemia occurs.  []  Oral hypoglycemic agents (glyburide, glipizide, glimepiride):  increased risk of hypoglycemia. Patient's glucose levels should be monitored closely when initiating amiodarone therapy.   []  Drugs that prolong the QT interval:  Torsades de pointes risk may be increased with concurrent use - avoid if possible.  Monitor QTc, also keep magnesium/potassium WNL if concurrent therapy can't be avoided. Marland Kitchen Antibiotics: e.g. fluoroquinolones, erythromycin. . Antiarrhythmics: e.g. quinidine, procainamide, disopyramide, sotalol. . Antipsychotics: e.g. phenothiazines, haloperidol.  . Lithium, tricyclic antidepressants, and methadone.  Thank you for allowing pharmacy to be a part of this patient's care.  Alycia Rossetti, PharmD, BCPS Clinical Pharmacist Clinical phone for 01/17/2020: [phone] 01/17/2020 3:40 PM   **Pharmacist phone directory can now be found on [email or web address] ([email or web address]).  Listed under [place].

## 2020-01-17 NOTE — Progress Notes (Signed)
Patient reverted back to afib post [facility] She was started on iv amiodarone post procedure due to frequent PAC;s She feels great but afib rate 120-130 bpm sitting in chair Will increase coreg to 12.5 bid BP's soft but currently [id number] mmHg systolic Would keep on iv amiodarone overnight  Change to 400 mg bid in am for a week then 200 bid for 2 weeks Consider d/c Wednesday with outpatient f/u in afib clinic Repeat [facility] in 4 weeks after decent amiodarone load  Jenkins Rouge MD [facility]

## 2020-01-17 NOTE — Anesthesia Postprocedure Evaluation (Signed)
Anesthesia Post Note  Patient: Erin Wong  Procedure(s) Performed: TRANSESOPHAGEAL ECHOCARDIOGRAM (TEE) (N/A ) CARDIOVERSION (N/A )     Anesthesia Post Evaluation  Last Vitals:  Vitals:   01/17/20 2014 01/17/20 2025  BP: 103/61   Pulse: (!) 109   Resp: 18   Temp: 36.8 C   SpO2: 96% 97%    Last Pain:  Vitals:   01/17/20 2014  TempSrc: Oral  PainSc:                  Angels

## 2020-01-17 NOTE — Anesthesia Procedure Notes (Signed)
Procedure Name: MAC Date/Time: 01/17/2020 1:37 PM Performed by: Leonor Liv, CRNA Pre-anesthesia Checklist: Patient identified, Emergency Drugs available, Suction available, Patient being monitored and Timeout performed Patient Re-evaluated:Patient Re-evaluated prior to induction Oxygen Delivery Method: Nasal cannula Airway Equipment and Method: Bite block Placement Confirmation: positive ETCO2 Dental Injury: Teeth and Oropharynx as per pre-operative assessment

## 2020-01-17 NOTE — Progress Notes (Addendum)
Progress Note  Patient Name: Erin Wong Date of Encounter: 01/17/2020  Primary Cardiologist: Jenkins Rouge, MD   Subjective   Pt overall feels well, but fatigued from her rate. Ready for DCCV today  Inpatient Medications    Scheduled Meds: . apixaban  5 mg Oral BID  . atorvastatin  40 mg Oral Daily  . carvedilol  6.25 mg Oral BID WC  . fluticasone  2 spray Each Nare Daily  . furosemide  20 mg Oral Daily  . mometasone-formoterol  2 puff Inhalation BID  . pantoprazole  40 mg Oral Daily  . psyllium  1 packet Oral Daily  . sacubitril-valsartan  1 tablet Oral BID  . sodium chloride flush  3 mL Intravenous Q12H   Continuous Infusions: . sodium chloride    . sodium chloride 20 mL/hr at 01/17/20 0344   PRN Meds: sodium chloride, acetaminophen, albuterol, ALPRAZolam, ondansetron (ZOFRAN) IV, simethicone, sodium chloride flush, zolpidem   Vital Signs    Vitals:   01/16/20 2009 01/17/20 0004 01/17/20 0339 01/17/20 0749  BP: 116/80 104/63 117/76 107/82  Pulse: (!) 111 (!) 103 69 (!) 136  Resp: 20 (!) 22 20   Temp: 98 F (36.7 C) 98 F (36.7 C) 98.3 F (36.8 C)   TempSrc: Oral Oral Oral   SpO2: 96% 96% 96%   Weight:   68.3 kg   Height:        Intake/Output Summary (Last 24 hours) at 01/17/2020 0806 Last data filed at 01/16/2020 2155 Gross per 24 hour  Intake 3 ml  Output --  Net 3 ml   Last 3 Weights 01/17/2020 01/16/2020 01/15/2020  Weight (lbs) 150 lb 8 oz 154 lb 5.2 oz 153 lb  Weight (kg) 68.266 kg 70 kg 69.4 kg      Telemetry    Afib with ventricular rates 120s-140s - Personally Reviewed  ECG    No new tracings - Personally Reviewed  Physical Exam   GEN: No acute distress.   Neck: No JVD Cardiac: irregular rhythm, tachycardic rate.  Respiratory: Clear to auscultation bilaterally. GI: Soft, nontender, non-distended  MS: No edema; No deformity. Neuro:  Nonfocal  Psych: Normal affect   Labs    High Sensitivity Troponin:   Recent Labs  Lab  01/13/20 1403 01/13/20 1556  TROPONINIHS 28* 24*      Chemistry Recent Labs  Lab 01/13/20 1403 01/13/20 1403 01/14/20 0601 01/14/20 1200 01/16/20 1056  NA 135   < > 134* 136 132*  K 4.1   < > 4.0 4.0 4.4  CL 102   < > 100 100 95*  CO2 22   < > 24 25 26   GLUCOSE 137*   < > 125* 154* 148*  BUN 24*   < > 23 26* 19  CREATININE 1.24*   < > 1.17* 1.20* 1.30*  CALCIUM 9.0   < > 8.8* 9.0 9.0  PROT 6.3*  --   --   --   --   ALBUMIN 3.7  --   --   --   --   AST 37  --   --   --   --   ALT 57*  --   --   --   --   ALKPHOS 75  --   --   --   --   BILITOT 1.2  --   --   --   --   GFRNONAA 44*   < > 48* 46* 42*  GFRAA 51*   < > 55* 53* 48*  ANIONGAP 11   < > 10 11 11    < > = values in this interval not displayed.     Hematology Recent Labs  Lab 01/13/20 1403 01/14/20 0601  WBC 14.2* 12.3*  RBC 4.07 3.84*  HGB 13.0 12.1  HCT 41.4 39.1  MCV 101.7* 101.8*  MCH 31.9 31.5  MCHC 31.4 30.9  RDW 13.9 14.1  PLT 348 335    BNP Recent Labs  Lab 01/13/20 1418  BNP 983.1*     DDimer No results for input(s): DDIMER in the last 168 hours.   Radiology    No results found.  Cardiac Studies   Echo 01/14/20  1. Left ventricular ejection fraction, by estimation, is 30 to 35%. The  left ventricle has moderately decreased function. The left ventricle  demonstrates global hypokinesis. The left ventricular internal cavity size  was mildly dilated. Left ventricular  diastolic parameters are indeterminate. There is akinesis of the left  ventricular, mid-apical inferior wall, anterior wall, inferolateral wall,  anterolateral wall and lateral wall. There is akinesis of the left  ventricular, apical septal wall and apical  segment.  2. Right ventricular systolic function is mildly reduced. The right  ventricular size is mildly enlarged. There is moderately elevated  pulmonary artery systolic pressure. The estimated right ventricular  systolic pressure is [id number] mmHg.  3. Left  atrial size was severely dilated.  4. The mitral valve is normal in structure. Mild to moderate mitral valve  regurgitation. No evidence of mitral stenosis.  5. Tricuspid valve regurgitation is mild to moderate.  6. The aortic valve is normal in structure. Aortic valve regurgitation is  trivial. No aortic stenosis is present.  7. The inferior vena cava is dilated in size with <50% respiratory  variability, suggesting right atrial pressure of 15 mmHg.   Patient Profile     69 y.o. female with history of chronic knee pain, low risk stress test in 2018, hyperlipidemia, HTN, asthma, CKD stage 3 who is being evaluated for sob and palpitations found to be in Afib RVR. Echo with EF of   Assessment & Plan    1. Atrial fibrillation with RVR - This patients CHA2DS2-VASc Score and unadjusted Ischemic Stroke Rate (% per year) is equal to 4.8 % stroke rate/year from a score of 4 (female, age, CHF, HTN) - continue eliquis - plan for TEE-guided cardioversion today - continue coreg - rates continue in the 120-140s - she is tolerating the rhythm and rate, but is fatigued   2. New onset systolic heart failure - echo with EF 30-35% with takotsubo-like appearance, per Dr. Kyla Balzarine note - increased right atrial pressure noted, severely dilated left atrium - continue carvedilol 6.25 mg BID ad entersto 24-26 mg BID - she believes her abdominal tightness is much improved - I&Os incomplete - weight is 150 lbs from 152 lbs at admission - will need a repeat echo outpatient in 4-6 weeks after sinus rhythm has been restored --> possible ischemic evaluation   3. Hypertension - medications as above - Pressure well controlled, if not marginal at times   4. 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 Agree with above.   69 year old  with newly discovered EF 30 to [id number], acute systolic heart failure of unknown etiology with atrial fibrillation rapid ventricular response, hypertension. Currently she is comfortable in bed, sitting upright. Husband in room. She states that over the last 2 weeks or so she has had abdominal bloating some increased fullness. Her weight is down from admission she states.  GEN: Well nourished, well developed, in no acute distress  HEENT: normal  Neck: no JVD, carotid bruits, or masses Cardiac: Irregularly irregular rapid rate; no murmurs, rubs, or gallops,no edema  Respiratory:  clear to auscultation bilaterally, normal work of breathing GI: soft, nontender, nondistended, + BS MS: no deformity or atrophy  Skin: warm and dry, no rash Neuro:  Alert and Oriented x 3, Strength and sensation are intact Psych: euthymic mood, full affect  Telemetry-atrial fibrillation heart rate in the 130 range personally reviewed.  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 It is possible that she is battling a tachycardia mediated cardiomyopathy. Obviously if she has continued reduction in her EF, we would proceed with cardiac catheterization at that time. This would also give [place] enough time for her to be on Eliquis at least 3 weeks post cardioversion.  Potential discharge later today.  Candee Furbish, MD

## 2020-01-17 NOTE — Interval H&P Note (Signed)
History and Physical Interval Note:  01/17/2020 1:20 PM  Erin Wong  has presented today for surgery, with the diagnosis of AFIB.  The various methods of treatment have been discussed with the patient and family. After consideration of risks, benefits and other options for treatment, the patient has consented to  Procedure(s): TRANSESOPHAGEAL ECHOCARDIOGRAM (TEE) (N/A) CARDIOVERSION (N/A) as a surgical intervention.  The patient's history has been reviewed, patient examined, no change in status, stable for surgery.  I have reviewed the patient's chart and labs.  Questions were answered to the patient's satisfaction.     Jenkins Rouge

## 2020-01-17 NOTE — Transfer of Care (Signed)
Immediate Anesthesia Transfer of Care Note  Patient: Erin Wong  Procedure(s) Performed: TRANSESOPHAGEAL ECHOCARDIOGRAM (TEE) (N/A ) CARDIOVERSION (N/A )  Patient Location: Endoscopy Unit  Anesthesia Type:MAC  Level of Consciousness: awake, alert  and oriented  Airway & Oxygen Therapy: Patient Spontanous Breathing and Patient connected to nasal cannula oxygen  Post-op Assessment: Report given to RN, Post -op Vital signs reviewed and stable and Patient moving all extremities  Post vital signs: Reviewed and stable  Last Vitals:  Vitals Value Taken Time  BP 116/65 01/17/20 1423  Temp 36.6 C 01/17/20 1405  Pulse 47 01/17/20 1428  Resp 19 01/17/20 1428  SpO2 100 % 01/17/20 1428  Vitals shown include unvalidated device data.  Last Pain:  Vitals:   01/17/20 1405  TempSrc: Temporal  PainSc: 0-No pain      Patients Stated Pain Goal: 0 (42/59/56 3875)  Complications: No apparent anesthesia complications

## 2020-01-17 NOTE — Anesthesia Preprocedure Evaluation (Addendum)
Anesthesia Evaluation  Patient identified by MRN, date of birth, ID band Patient awake    Reviewed: Allergy & Precautions, NPO status   Airway Mallampati: II  TM Distance: >3 FB     Dental   Pulmonary former smoker,    breath sounds clear to auscultation       Cardiovascular hypertension,  Rhythm:Regular Rate:Normal     Neuro/Psych    GI/Hepatic GERD  ,  Endo/Other    Renal/GU Renal disease     Musculoskeletal   Abdominal   Peds  Hematology   Anesthesia Other Findings   Reproductive/Obstetrics                            Anesthesia Physical Anesthesia Plan  ASA: III  Anesthesia Plan: General   Post-op Pain Management:    Induction: Intravenous  PONV Risk Score and Plan: 3 and Ondansetron and Dexamethasone  Airway Management Planned: Simple Face Mask and Nasal Cannula  Additional Equipment:   Intra-op Plan:   Post-operative Plan:   Informed Consent: I have reviewed the patients History and Physical, chart, labs and discussed the procedure including the risks, benefits and alternatives for the proposed anesthesia with the patient or authorized representative who has indicated his/her understanding and acceptance.     Dental advisory given  Plan Discussed with: Anesthesiologist and CRNA  Anesthesia Plan Comments:        Anesthesia Quick Evaluation

## 2020-01-17 NOTE — Progress Notes (Signed)
Heart Failure Stewardship Pharmacist Progress Note   PCP: Erin Frizzle, MD PCP-Cardiologist: Erin Rouge, MD    HPI:  [name] with PMH significant for chronic knee pain, low risk stress test in 2018, HLD, HTN, asthma, CKD III who was admitted on 5/13 for shortness of breath and palpitations for 1 week.   IV diltiazem was started in the ED for new onset afib RVR and that has now been transitioned to PO.  She had also been receiving furosemide 20 mg IV BID for fluid overload.   ECHO on 01/14/20 with new low LVEF of 30-35% and mildly reduced RV function.   Diltiazem has been transitioned to carvedilol with new low EF.  Plan for TEE/DCCV today  Current HF Medications: Furosemide 20 mg daily Carvedilol 6.25 mg BID Entresto 24/26 mg BID  Prior to admission HF Medications: Benazepril 20 mg daily Isosorbide mononitrate 30 mg daily  Pertinent Lab Values: . Serum creatinine 1.30, BUN 19, Potassium 4.4, Sodium 132, BNP 983.1  Vital Signs: . Weight: 150 lbs (estimated dry weight: 150 lbs) . Blood pressure: 110/70s  . Heart rate: 120s   Medication Assistance / Insurance Benefits Check: Does the patient have prescription insurance?   yes Type of insurance plan: Medicare - [employer]  Does the patient qualify for medication assistance through manufacturers or grants?   Yes, patient meets household income requirements for free medications through drug manufacturers  Eligible grants and/or patient assistance programs:   Lisabeth Register  Medication assistance applications in progress:   Entresto  Medication assistance applications approved: None  Approved medication assistance renewals will be completed by: Dr. Kyla Balzarine office  Outpatient Pharmacy:  Prior to admission outpatient pharmacy: [facility] Is the patient willing to use [facility] at discharge? Yes - pharmacy has been updated Is the patient willing to transition their outpatient pharmacy to utilize a  [facility] outpatient pharmacy? Yes, will call [facility] to enroll patient and her husband in mail order pharmacy    Assessment: 1. Acute systolic CHF (EF 99991111). NYHA class II symptoms. Volume status improved on MD exam following IV diuresis. DCCV today. - Continue furosemide 20 mg daily - Consider carvedilol 6.25 mg BID - Continue Entresto 24/26 mg BID - Consider adding spironolactone at discharge - Consider adding Farxiga at discharge   Plan: 1) Medication changes recommended at this time: - Add spironolactone or Farxiga at discharge if hemodynamically stable post cardioversion.  2) Patient assistance application(s): - [name] is $35 per month - patient provided with patient assistance forms to receive for free through [place] is $35 per month - patient provided with patient assistance forms to receive for free through [place]  3)  Education  - Patient has been educated on current HF medications (ARNI, BB, furosemide) and potential additions to HF medications (spironolactone, Farxiga) -Patient verbalizes understanding that over the next few months, these medication doses may change and more medications may be added to optimize HF regimen -Patient has been educated on basic disease state pathophysiology and goals of therapy -Time spent (30 mins)   Vertis Kelch, PharmD, BCPS  Heart Failure Stewardship Pharmacist Phone [phone] 01/17/2020       10:18 AM

## 2020-01-18 MED ORDER — AMIODARONE HCL 200 MG PO TABS
400.0000 mg | ORAL_TABLET | Freq: Every day | ORAL | Status: DC
  Administered 2020-01-18 – 2020-01-19 (×2): 400 mg via ORAL
  Filled 2020-01-18 (×2): qty 2

## 2020-01-18 NOTE — Progress Notes (Signed)
Heart Failure Stewardship Pharmacist Progress Note   PCP: [name], MD PCP-Cardiologist: Jenkins Rouge, MD    HPI:  [name] with PMH significant for chronic knee pain, low risk stress test in 2018, HLD, HTN, asthma, CKD III who was admitted on 5/13 for shortness of breath and palpitations for 1 week.   IV diltiazem was started in the ED for new onset afib RVR and that has now been transitioned to PO.  She had also been receiving IV furosemide for fluid overload but this has been transitioned back to PO.   ECHO on 01/14/20 with new low LVEF of 30-35% and mildly reduced RV function.   Diltiazem has been transitioned to carvedilol with new low EF.  S/p TEE/DCCV on 5/17 but has reverted back to afib.   Current HF Medications: Furosemide 20 mg daily Carvedilol 12.5 mg BID Entresto 24/26 mg BID  Prior to admission HF Medications: Benazepril 20 mg daily Isosorbide mononitrate 30 mg daily  Pertinent Lab Values: . Serum creatinine 1.30, BUN 19, Potassium 4.4, Sodium 132, BNP 983.1  Vital Signs: . Weight: 149 lbs (estimated dry weight: 150 lbs) . Blood pressure: 100/60s  . Heart rate: 120s   Medication Assistance / Insurance Benefits Check: Does the patient have prescription insurance?   yes Type of insurance plan: Medicare - [employer]  Does the patient qualify for medication assistance through manufacturers or grants?   Yes, patient meets household income requirements for free medications through drug manufacturers  Eligible grants and/or patient assistance programs:   Lisabeth Register  Medication assistance applications in progress:   Entresto  Medication assistance applications approved: None  Approved medication assistance renewals will be completed by: Dr. Kyla Balzarine office  Outpatient Pharmacy:  Prior to admission [facility] Is the patient willing to use [place] at discharge? Yes - pharmacy has been updated Is the patient  willing to transition their outpatient pharmacy to utilize a [facility] outpatient pharmacy? Yes, will call [facility] to enroll patient and her husband in mail order pharmacy    Assessment: 1. Acute systolic CHF (EF 99991111). NYHA class II symptoms. Volume status improved on MD exam following IV diuresis. No edema or JVD. - Continue furosemide 20 mg daily - Consider carvedilol 12.5 mg BID - Continue Entresto 24/26 mg BID - Last BMET on 5/16 - will recheck in AM - Consider adding spironolactone if BMET and BP stable - Consider adding Farxiga at discharge   Plan: 1) Medication changes recommended at this time: - None - BP soft with current regimen. Will check BMET in AM and may be able to add spironolactone vs Farxiga  2) Patient assistance application(s): - [name] is $35 per month - patient provided with patient assistance forms to receive for free through [place] is $35 per month - patient provided with patient assistance forms to receive for free through [employer]  3)  Education  - Patient has been educated on current HF medications (ARNI, BB, furosemide) and potential additions to HF medications (spironolactone, Farxiga) -Patient verbalizes understanding that over the next few months, these medication doses may change and more medications may be added to optimize HF regimen -Patient has been educated on basic disease state pathophysiology and goals of therapy -Time spent (30 mins)   Vertis Kelch, PharmD, BCPS  Heart Failure Stewardship Pharmacist Phone [phone] 01/18/2020       9:26 AM

## 2020-01-18 NOTE — Care Management Important Message (Signed)
Important Message  Patient Details  Name: Erin Wong MRN: [id number] Date of Birth: 1950-11-23   Medicare Important Message Given:  Yes     [name] 01/18/2020, 9:40 AM

## 2020-01-18 NOTE — Progress Notes (Signed)
Progress Note  Patient Name: Erin Wong Date of Encounter: 01/18/2020  Primary Cardiologist: Jenkins Rouge, MD   Subjective   Feels well today.  Sitting up in bed eating breakfast.  Husband in room as well.  No chest pain no shortness of breath.  Personally reviewed Dr. Johnsie Cancel notes  Inpatient Medications    Scheduled Meds: . amiodarone  150 mg Intravenous Once  . apixaban  5 mg Oral BID  . atorvastatin  40 mg Oral Daily  . carvedilol  12.5 mg Oral BID WC  . fluticasone  2 spray Each Nare Daily  . furosemide  20 mg Oral Daily  . mometasone-formoterol  2 puff Inhalation BID  . pantoprazole  40 mg Oral Daily  . psyllium  1 packet Oral Daily  . sacubitril-valsartan  1 tablet Oral BID  . sodium chloride flush  3 mL Intravenous Q12H   Continuous Infusions: . sodium chloride    . amiodarone 30 mg/hr (01/18/20 0437)   PRN Meds: sodium chloride, acetaminophen, albuterol, ALPRAZolam, ondansetron (ZOFRAN) IV, simethicone, sodium chloride flush, zolpidem   Vital Signs    Vitals:   01/18/20 0618 01/18/20 0817 01/18/20 0834 01/18/20 0937  BP: 108/80 100/63  110/63  Pulse: (!) 105 97  (!) 118  Resp: 18 16    Temp: 98.2 F (36.8 C) 98.1 F (36.7 C)    TempSrc: Oral Oral    SpO2: 98% 96% 98%   Weight: 68 kg     Height:        Intake/Output Summary (Last 24 hours) at 01/18/2020 0952 Last data filed at 01/18/2020 0300 Gross per 24 hour  Intake 865.94 ml  Output -  Net 865.94 ml   Last 3 Weights 01/18/2020 01/17/2020 01/17/2020  Weight (lbs) 149 lb 14.4 oz 150 lb 9.2 oz 150 lb 8 oz  Weight (kg) 67.994 kg 68.3 kg 68.266 kg      Telemetry    Atrial fibrillation heart rate currently in the 110-120 range- Personally Reviewed  ECG    Atrial fibrillation- Personally Reviewed  Physical Exam   GEN: No acute distress.   Neck: No JVD Cardiac:  Irregularly irregular, tachycardic, no murmurs, rubs, or gallops.  Respiratory: Clear to auscultation bilaterally. GI: Soft,  nontender, non-distended  MS: No edema; No deformity. Neuro:  Nonfocal  Psych: Normal affect   Labs    High Sensitivity Troponin:   Recent Labs  Lab 01/13/20 1403 01/13/20 1556  TROPONINIHS 28* 24*      Chemistry Recent Labs  Lab 01/13/20 1403 01/13/20 1403 01/14/20 0601 01/14/20 1200 01/16/20 1056  NA 135   < > 134* 136 132*  K 4.1   < > 4.0 4.0 4.4  CL 102   < > 100 100 95*  CO2 22   < > 24 25 26   GLUCOSE 137*   < > 125* 154* 148*  BUN 24*   < > 23 26* 19  CREATININE 1.24*   < > 1.17* 1.20* 1.30*  CALCIUM 9.0   < > 8.8* 9.0 9.0  PROT 6.3*  --   --   --   --   ALBUMIN 3.7  --   --   --   --   AST 37  --   --   --   --   ALT 57*  --   --   --   --   ALKPHOS 75  --   --   --   --  BILITOT 1.2  --   --   --   --   GFRNONAA 44*   < > 48* 46* 42*  GFRAA 51*   < > 55* 53* 48*  ANIONGAP 11   < > 10 11 11    < > = values in this interval not displayed.     Hematology Recent Labs  Lab 01/13/20 1403 01/14/20 0601  WBC 14.2* 12.3*  RBC 4.07 3.84*  HGB 13.0 12.1  HCT 41.4 39.1  MCV 101.7* 101.8*  MCH 31.9 31.5  MCHC 31.4 30.9  RDW 13.9 14.1  PLT 348 335    BNP Recent Labs  Lab 01/13/20 1418  BNP 983.1*     DDimer No results for input(s): DDIMER in the last 168 hours.   Radiology    ECHO TEE  Result Date: 01/17/2020    TRANSESOPHOGEAL ECHO REPORT   Patient Name:   Erin Wong Date of Exam: 01/17/2020 Medical Rec #:  [id number]     Height:       64.0 in Accession #:    [id number]    Weight:       150.6 lb Date of Birth:  05/22/1951     BSA:          1.734 m Patient Age:    69 years      BP:           95/66 mmHg Patient Gender: F             HR:           144 bpm. Exam Location:  Inpatient Procedure: Transesophageal Echo and TEE/DCC Indications:    Atrial fibrillation  History:        Patient has prior history of Echocardiogram examinations.                 Arrythmias:Atrial Fibrillation.  Sonographer:    Dustin Flock Referring Phys: [place]: The transesophogeal probe was passed without difficulty through the esophogus of the patient. Sedation performed by different physician. The patient's vital signs; including heart rate, blood pressure, and oxygen saturation; remained stable throughout the procedure. The patient developed no complications during the procedure. IMPRESSIONS  1. Patient in rapid afib. No LAA thrombus On Eliquis DCC x 1 150J biphasic converted wit NSR with PaC;s and salvo's of recurrent PACls BP somewhat low post conversions Rx with one bolus Neo and will start amiodarone drip.  2. Left ventricular ejection fraction, by estimation, is 30 to 35%. The left ventricle has moderately decreased function. The left ventricle demonstrates global hypokinesis. The left ventricular internal cavity size was moderately dilated. Left ventricular diastolic parameters are indeterminate.  3. Right ventricular systolic function is moderately reduced. The right ventricular size is moderately enlarged.  4. Left atrial size was moderately dilated. No left atrial/left atrial appendage thrombus was detected.  5. Right atrial size was moderately dilated.  6. The mitral valve is normal in structure. Moderate to severe mitral valve regurgitation. No evidence of mitral stenosis.  7. Tricuspid valve regurgitation is moderate.  8. The aortic valve is tricuspid. Aortic valve regurgitation is trivial. No aortic stenosis is present.  9. The inferior vena cava is normal in size with greater than 50% respiratory variability, suggesting right atrial pressure of 3 mmHg. Conclusion(s)/Recommendation(s): Normal biventricular function without evidence of hemodynamically significant valvular heart disease. FINDINGS  Left Ventricle: Left ventricular ejection fraction, by estimation, is 30 to 35%. The left ventricle has moderately  decreased function. The left ventricle demonstrates global hypokinesis. The left ventricular internal cavity size was moderately dilated.  There is no left ventricular hypertrophy. Left ventricular diastolic parameters are indeterminate. Right Ventricle: The right ventricular size is moderately enlarged. Right vetricular wall thickness was not assessed. Right ventricular systolic function is moderately reduced. Left Atrium: Left atrial size was moderately dilated. No left atrial/left atrial appendage thrombus was detected. Right Atrium: Right atrial size was moderately dilated. Pericardium: There is no evidence of pericardial effusion. Mitral Valve: The mitral valve is normal in structure. Normal mobility of the mitral valve leaflets. Moderate to severe mitral valve regurgitation. No evidence of mitral valve stenosis. Tricuspid Valve: The tricuspid valve is normal in structure. Tricuspid valve regurgitation is moderate . No evidence of tricuspid stenosis. Aortic Valve: The aortic valve is tricuspid. Aortic valve regurgitation is trivial. No aortic stenosis is present. Pulmonic Valve: The pulmonic valve was normal in structure. Pulmonic valve regurgitation is mild. No evidence of pulmonic stenosis. Aorta: The aortic root is normal in size and structure. Venous: The inferior vena cava is normal in size with greater than 50% respiratory variability, suggesting right atrial pressure of 3 mmHg. IAS/Shunts: No atrial level shunt detected by color flow Doppler.  MR Peak grad:   97.6 mmHg MR Mean grad:   56.0 mmHg MR Vmax:        494.00 cm/s MR Vmean:       338.0 cm/s MR PISA:        2.26 cm MR PISA Radius: 0.60 cm Jenkins Rouge MD Electronically signed by Jenkins Rouge MD Signature Date/Time: 01/17/2020/2:22:16 PM    Final     Cardiac Studies   Echo EF 30-35  Patient Profile     69 y.o. female here with atrial fibrillation rapid ventricular response with new onset cardiomyopathy EF 30 to 35% with chronic anticoagulation.  Assessment & Plan    Atrial fibrillation with rapid ventricular response, paroxysmal -Tried cardioversion on 01/17/2020 however  promptly reverted back to atrial fibrillation shortly thereafter. -Dr. Johnsie Cancel put her on IV amiodarone yesterday, we will convert to p.o. amiodarone 400 twice daily today for 1 week and then 200 twice daily for 2 weeks and then repeat cardioversion in 4 weeks after adequate amiodarone load. -We will keep her here today with discharge tomorrow. -Carvedilol was increased to 12.5 mg twice a day as well.  Blood pressure has been fairly soft.  New onset cardiomyopathy, acute systolic heart failure -Hopefully once we are able to establish rhythm control, her EF will follow.  This could potentially be a tachycardia mediated cardiomyopathy.  If her EF remains low however, further ischemic work-up will be necessary.  Hopeful discharge tomorrow.   For questions or updates, please contact [place] Please consult [email or web address] for contact info under        Signed, Candee Furbish, MD  01/18/2020, 9:52 AM

## 2020-01-19 ENCOUNTER — Other Ambulatory Visit: Payer: Self-pay

## 2020-01-19 ENCOUNTER — Encounter (HOSPITAL_COMMUNITY): Payer: Self-pay | Admitting: Cardiovascular Disease

## 2020-01-19 DIAGNOSIS — I5021 Acute systolic (congestive) heart failure: Secondary | ICD-10-CM

## 2020-01-19 LAB — BASIC METABOLIC PANEL
Anion gap: 10 (ref 5–15)
BUN: 26 mg/dL — ABNORMAL HIGH (ref 8–23)
CO2: 25 mmol/L (ref 22–32)
Calcium: 8.7 mg/dL — ABNORMAL LOW (ref 8.9–10.3)
Chloride: 100 mmol/L (ref 98–111)
Creatinine, Ser: 1.19 mg/dL — ABNORMAL HIGH (ref 0.44–1.00)
GFR calc Af Amer: 54 mL/min — ABNORMAL LOW (ref >60)
GFR calc non Af Amer: 47 mL/min — ABNORMAL LOW (ref >60)
Glucose, Bld: 115 mg/dL — ABNORMAL HIGH (ref 70–99)
Potassium: 3.9 mmol/L (ref 3.5–5.1)
Sodium: 135 mmol/L (ref 135–145)

## 2020-01-19 LAB — MAGNESIUM: Magnesium: 1.8 mg/dL (ref 1.7–2.4)

## 2020-01-19 MED ORDER — FUROSEMIDE 20 MG PO TABS: 20.0000 mg | ORAL_TABLET | Freq: Every day | ORAL | 0 refills | Status: DC

## 2020-01-19 MED ORDER — SACUBITRIL-VALSARTAN 24-26 MG PO TABS: 1.0000 | ORAL_TABLET | Freq: Two times a day (BID) | ORAL | 1 refills | Status: DC

## 2020-01-19 MED ORDER — APIXABAN 5 MG PO TABS: 5.0000 mg | ORAL_TABLET | Freq: Two times a day (BID) | ORAL | 2 refills | Status: DC

## 2020-01-19 MED ORDER — AMIODARONE HCL 200 MG PO TABS: ORAL_TABLET | ORAL | 0 refills | Status: DC

## 2020-01-19 MED ORDER — CARVEDILOL 12.5 MG PO TABS: 12.5000 mg | ORAL_TABLET | Freq: Two times a day (BID) | ORAL | 0 refills | Status: DC

## 2020-01-19 MED FILL — FUROSEMIDE 20 MG TAB: 20 | 30 days supply | Qty: 30 | Fill #0

## 2020-01-19 MED FILL — CARVEDILOL 12.5 MG TABLET: 12.5 | 30 days supply | Qty: 60 | Fill #0

## 2020-01-19 MED FILL — AMIODARONE HCL 200 MG TAB: 200 | 23 days supply | Qty: 60 | Fill #0

## 2020-01-19 MED FILL — ELIQUIS 5 MG TABLET: 5 | 30 days supply | Qty: 60 | Fill #0

## 2020-01-19 MED FILL — ENTRESTO 24 MG-26 MG TABLET: 24-26 | 30 days supply | Qty: 60 | Fill #0

## 2020-01-19 NOTE — Progress Notes (Signed)
Heart Failure Stewardship Pharmacist Progress Note   PCP: [name], MD PCP-Cardiologist: Jenkins Rouge, MD    HPI:  [name] with PMH significant for chronic knee pain, low risk stress test in 2018, HLD, HTN, asthma, CKD III who was admitted on 5/13 for shortness of breath and palpitations for 1 week.   IV diltiazem was started in the ED for new onset afib RVR and was transitioned to PO.  She had also been receiving IV furosemide for fluid overload but this has been transitioned back to PO.   ECHO on 01/14/20 with new low LVEF of 30-35% and mildly reduced RV function.   Diltiazem has been transitioned to carvedilol with new low EF.  S/p TEE/DCCV on 5/17 but has reverted back to afib.   Current HF Medications: Furosemide 20 mg daily Carvedilol 12.5 mg BID Entresto 24/26 mg BID  Prior to admission HF Medications: Benazepril 20 mg daily Isosorbide mononitrate 30 mg daily  Pertinent Lab Values: . Serum creatinine 1.19, BUN 26, Potassium 3.9, Sodium 135, BNP 983.1, Magnesium 1.8  Vital Signs: . Weight: 150 lbs (estimated dry weight: 150 lbs) . Blood pressure: 100/70s  . Heart rate: 110s   Medication Assistance / Insurance Benefits Check: Does the patient have prescription insurance?   yes Type of insurance plan: Medicare - [employer]  Does the patient qualify for medication assistance through manufacturers or grants?   Yes, patient meets household income requirements for free medications through drug manufacturers  Eligible grants and/or patient assistance programs:   Lisabeth Register  Medication assistance applications in progress:   Entresto  Medication assistance applications approved: None  Approved medication assistance renewals will be completed by: Dr. Kyla Balzarine office  Outpatient Pharmacy:  Prior to admission outpatient pharmacy: [facility] Is the patient willing to use [facility] at discharge? Yes - pharmacy has been updated Is the  patient willing to transition their outpatient pharmacy to utilize a [facility] outpatient pharmacy? Yes, patient will call [facility] to enroll in mail order    Assessment: 1. Acute systolic CHF (EF 99991111). NYHA class II symptoms. Volume status improved on MD exam following IV diuresis. No edema or JVD. - Continue furosemide 20 mg daily - Consider carvedilol 12.5 mg BID - Continue Entresto 24/26 mg BID - Consider adding spironolactone at hospital follow up appointment - Consider adding Farxiga at hospital follow up appointment   Plan: 1) Medication changes recommended at this time: - Start spironolactone or Farxiga after discharge  2) Patient assistance application(s): - [name] is $35 per month - patient provided with patient assistance forms to receive for free through [place] is $35 per month - patient provided with patient assistance forms to receive for free through [employer]  3)  Education  - Patient has been educated on current HF medications (ARNI, BB, furosemide) and potential additions to HF medications (spironolactone, Farxiga) -Patient verbalizes understanding that over the next few months, these medication doses may change and more medications may be added to optimize HF regimen -Patient has been educated on basic disease state pathophysiology and goals of therapy -Time spent (30 mins)   Vertis Kelch, PharmD, BCPS  Heart Failure Stewardship Pharmacist Phone [phone] 01/19/2020       10:24 AM

## 2020-01-19 NOTE — Progress Notes (Signed)
Progress Note  Patient Name: Erin Wong Date of Encounter: 01/19/2020  Primary Cardiologist: Jenkins Rouge, MD   Subjective   Feels well once again, no fevers chills nausea vomiting syncope bleeding orthopnea shortness of breath.  Inpatient Medications    Scheduled Meds: . amiodarone  400 mg Oral Daily  . apixaban  5 mg Oral BID  . atorvastatin  40 mg Oral Daily  . carvedilol  12.5 mg Oral BID WC  . fluticasone  2 spray Each Nare Daily  . furosemide  20 mg Oral Daily  . mometasone-formoterol  2 puff Inhalation BID  . pantoprazole  40 mg Oral Daily  . psyllium  1 packet Oral Daily  . sacubitril-valsartan  1 tablet Oral BID  . sodium chloride flush  3 mL Intravenous Q12H   Continuous Infusions: . sodium chloride     PRN Meds: sodium chloride, acetaminophen, albuterol, ALPRAZolam, ondansetron (ZOFRAN) IV, simethicone, sodium chloride flush, zolpidem   Vital Signs    Vitals:   01/19/20 0025 01/19/20 0525 01/19/20 0817 01/19/20 0847  BP: 108/74 106/73 100/60 103/73  Pulse: 95 93 (!) 42   Resp: 17 15    Temp: 98.3 F (36.8 C) 98.3 F (36.8 C) 98.9 F (37.2 C)   TempSrc: Oral Oral Oral   SpO2: 94% 96% 97%   Weight:  68.4 kg    Height:        Intake/Output Summary (Last 24 hours) at 01/19/2020 0925 Last data filed at 01/18/2020 1045 Gross per 24 hour  Intake 129.05 ml  Output --  Net 129.05 ml   Last 3 Weights 01/19/2020 01/18/2020 01/17/2020  Weight (lbs) 150 lb 11.2 oz 149 lb 14.4 oz 150 lb 9.2 oz  Weight (kg) 68.357 kg 67.994 kg 68.3 kg      Telemetry    A. fib 100s-140s personally Reviewed  ECG    No new- Personally Reviewed  Physical Exam   GEN: No acute distress.   Neck: No JVD Cardiac:  Irregularly irregular tachycardic, no murmurs, rubs, or gallops.  Respiratory: Clear to auscultation bilaterally. GI: Soft, nontender, non-distended  MS: No edema; No deformity. Neuro:  Nonfocal  Psych: Normal affect   Labs    High Sensitivity Troponin:    Recent Labs  Lab 01/13/20 1403 01/13/20 1556  TROPONINIHS 28* 24*      Chemistry Recent Labs  Lab 01/13/20 1403 01/14/20 0601 01/14/20 1200 01/16/20 1056 01/19/20 0444  NA 135   < > 136 132* 135  K 4.1   < > 4.0 4.4 3.9  CL 102   < > 100 95* 100  CO2 22   < > 25 26 25   GLUCOSE 137*   < > 154* 148* 115*  BUN 24*   < > 26* 19 26*  CREATININE 1.24*   < > 1.20* 1.30* 1.19*  CALCIUM 9.0   < > 9.0 9.0 8.7*  PROT 6.3*  --   --   --   --   ALBUMIN 3.7  --   --   --   --   AST 37  --   --   --   --   ALT 57*  --   --   --   --   ALKPHOS 75  --   --   --   --   BILITOT 1.2  --   --   --   --   GFRNONAA 44*   < > 46* 42* 47*  GFRAA 51*   < > 53* 48* 54*  ANIONGAP 11   < > 11 11 10    < > = values in this interval not displayed.     Hematology Recent Labs  Lab 01/13/20 1403 01/14/20 0601  WBC 14.2* 12.3*  RBC 4.07 3.84*  HGB 13.0 12.1  HCT 41.4 39.1  MCV 101.7* 101.8*  MCH 31.9 31.5  MCHC 31.4 30.9  RDW 13.9 14.1  PLT 348 335    BNP Recent Labs  Lab 01/13/20 1418  BNP 983.1*     DDimer No results for input(s): DDIMER in the last 168 hours.   Radiology    ECHO TEE  Result Date: 01/17/2020    TRANSESOPHOGEAL ECHO REPORT   Patient Name:   Erin Wong Date of Exam: 01/17/2020 Medical Rec #:  [id number]     Height:       64.0 in Accession #:    [id number]    Weight:       150.6 lb Date of Birth:  Oct 03, 1950     BSA:          1.734 m Patient Age:    69 years      BP:           95/66 mmHg Patient Gender: F             HR:           144 bpm. Exam Location:  Inpatient Procedure: Transesophageal Echo and TEE/DCC Indications:    Atrial fibrillation  History:        Patient has prior history of Echocardiogram examinations.                 Arrythmias:Atrial Fibrillation.  Sonographer:    Dustin Flock Referring Phys: [place]: The transesophogeal probe was passed without difficulty through the esophogus of the patient. Sedation performed by different  physician. The patient's vital signs; including heart rate, blood pressure, and oxygen saturation; remained stable throughout the procedure. The patient developed no complications during the procedure. IMPRESSIONS  1. Patient in rapid afib. No LAA thrombus On Eliquis DCC x 1 150J biphasic converted wit NSR with PaC;s and salvo's of recurrent PACls BP somewhat low post conversions Rx with one bolus Neo and will start amiodarone drip.  2. Left ventricular ejection fraction, by estimation, is 30 to 35%. The left ventricle has moderately decreased function. The left ventricle demonstrates global hypokinesis. The left ventricular internal cavity size was moderately dilated. Left ventricular diastolic parameters are indeterminate.  3. Right ventricular systolic function is moderately reduced. The right ventricular size is moderately enlarged.  4. Left atrial size was moderately dilated. No left atrial/left atrial appendage thrombus was detected.  5. Right atrial size was moderately dilated.  6. The mitral valve is normal in structure. Moderate to severe mitral valve regurgitation. No evidence of mitral stenosis.  7. Tricuspid valve regurgitation is moderate.  8. The aortic valve is tricuspid. Aortic valve regurgitation is trivial. No aortic stenosis is present.  9. The inferior vena cava is normal in size with greater than 50% respiratory variability, suggesting right atrial pressure of 3 mmHg. Conclusion(s)/Recommendation(s): Normal biventricular function without evidence of hemodynamically significant valvular heart disease. FINDINGS  Left Ventricle: Left ventricular ejection fraction, by estimation, is 30 to 35%. The left ventricle has moderately decreased function. The left ventricle demonstrates global hypokinesis. The left ventricular internal cavity size was moderately dilated. There is no left ventricular hypertrophy. Left ventricular  diastolic parameters are indeterminate. Right Ventricle: The right ventricular  size is moderately enlarged. Right vetricular wall thickness was not assessed. Right ventricular systolic function is moderately reduced. Left Atrium: Left atrial size was moderately dilated. No left atrial/left atrial appendage thrombus was detected. Right Atrium: Right atrial size was moderately dilated. Pericardium: There is no evidence of pericardial effusion. Mitral Valve: The mitral valve is normal in structure. Normal mobility of the mitral valve leaflets. Moderate to severe mitral valve regurgitation. No evidence of mitral valve stenosis. Tricuspid Valve: The tricuspid valve is normal in structure. Tricuspid valve regurgitation is moderate . No evidence of tricuspid stenosis. Aortic Valve: The aortic valve is tricuspid. Aortic valve regurgitation is trivial. No aortic stenosis is present. Pulmonic Valve: The pulmonic valve was normal in structure. Pulmonic valve regurgitation is mild. No evidence of pulmonic stenosis. Aorta: The aortic root is normal in size and structure. Venous: The inferior vena cava is normal in size with greater than 50% respiratory variability, suggesting right atrial pressure of 3 mmHg. IAS/Shunts: No atrial level shunt detected by color flow Doppler.  MR Peak grad:   97.6 mmHg MR Mean grad:   56.0 mmHg MR Vmax:        494.00 cm/s MR Vmean:       338.0 cm/s MR PISA:        2.26 cm MR PISA Radius: 0.60 cm Jenkins Rouge MD Electronically signed by Jenkins Rouge MD Signature Date/Time: 01/17/2020/2:22:16 PM    Final     Cardiac Studies   Echo EF 30 to 35%  Patient Profile     69 y.o. female here with atrial fibrillation rapid ventricular response with new onset cardiomyopathy EF 30 to 35% with chronic anticoagulation.  Assessment & Plan    Atrial fibrillation with rapid ventricular response, paroxysmal -Tried cardioversion on 01/17/2020 however promptly reverted back to atrial fibrillation shortly thereafter. -Dr. Johnsie Cancel put her on IV amiodarone yesterday, we will convert to  p.o. amiodarone 400 twice daily today for 1 week and then 200 twice daily for 2 weeks and then repeat cardioversion in 4 weeks after adequate amiodarone load. -Rate control remains suboptimal, however we will continue with amiodarone load and plan for cardioversion in 3 to 4 weeks.  She is asymptomatic currently.  New onset cardiomyopathy/acute systolic heart failure -Hopefully once we are able to establish rhythm control, her EF will follow.  This could potentially be a tachycardia mediated cardiomyopathy.  If her EF remains low however, further ischemic work-up will be necessary.  Okay for discharge based upon lack of current symptoms.  Ideally we would have optimal rate control however we will continue with increased dose of carvedilol as well as amiodarone.  For questions or updates, please contact [place] Please consult [email or web address] for contact info under        Signed, Candee Furbish, MD  01/19/2020, 9:25 AM

## 2020-01-19 NOTE — Progress Notes (Signed)
CARDIOLOGY OFFICE NOTE  Date:  01/26/2020    Erin Wong Date of Birth: 1950/10/11 Medical Record [id number]  PCP:  [name], MD  Cardiologist:  Johnsie Cancel ([place])   Chief Complaint  Patient presents with  . Hospitalization Follow-up    Seen for Dr. Johnsie Cancel    History of Present Illness: Erin Wong is a 69 y.o. female who presents today for a post hospital visit. Seen for Dr. Johnsie Cancel ([facility]).   She has a history of chronic knee pain, HLD, HTN, asthma, and CKD stage 3.   She presented earlier this month with SOB and palpitations.  [name] had a low risk stress test in 2018 with no signs of ischemia, EF 54%. She saw her PCP 02/2019 for chest pain and was referred to cardiology but neverfollowed-up.At that time she was started on Imdur. She has no past history of MI, stent, stroke, or DM. Her brother had congenital heart disease.Noted alcohol use on the weekends. She was noted to be in AF with RVR in the ER. She was started on IV dilt. Cardioversion yielded short term results - thus amiodarone was started. Plan was to attempt repeat cardioversion after 4 weeks of therapy. Anticoagulation was started. Echo with an EF of 30 to 35% - ?tachycardia mediated CM.   The patient does not have symptoms concerning for COVID-19 infection (fever, chills, cough, or new shortness of breath).   Comes in today. Here alone. We brought her husband in to the visit as well. She feels good. She is not having any chest pain. She is not short of breath. She has no awareness of her AF. Tolerating her medicines but cost is probably going to be an issue. She has patient assistance forms - for her [name] and for Wilder Glade (which she is not on). She is not diabetic. She is restricting her salt. She has had no alcohol as well. She is not lightheaded or dizzy.   Past Medical History:  Diagnosis Date  . Allergy   . Arthritis   . Asthma   . Benign essential HTN   . CKD (chronic kidney disease) stage 3,  GFR 30-59 ml/min   . HLD (hyperlipidemia)     Past Surgical History:  Procedure Laterality Date  . ABDOMINAL HYSTERECTOMY  10/04/1995   Hysterectomy and Bilateral oophorectomy  . CARDIOVERSION N/A 01/17/2020   Procedure: CARDIOVERSION;  Surgeon: [name], MD;  Location: [facility] ENDOSCOPY;  Service: Cardiovascular;  Laterality: N/A;  . COLON SURGERY  09/2009   hole in colon  . FRACTURE SURGERY Right 08/02/2011   Right Leg--Plates & Screws  . 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 TEE WITHOUT CARDIOVERSION N/A 01/17/2020   Procedure: TRANSESOPHAGEAL ECHOCARDIOGRAM (TEE);  Surgeon: [name], MD;  Location: [facility] ENDOSCOPY;  Service: Cardiovascular;  Laterality: N/A;     Medications: Current Meds  Medication Sig  . acetaminophen (TYLENOL) 650 MG CR tablet Take 2 tablets (1,300 mg total) by mouth 2 (two) times daily as needed for pain.  Marland Kitchen albuterol (PROVENTIL HFA;VENTOLIN HFA) 108 (90 Base) MCG/ACT inhaler Inhale 1 puff into the lungs every 6 (six) hours as needed for wheezing or shortness of breath.  Marland Kitchen amiodarone (PACERONE) 200 MG tablet Take 1 tablet (200mg ) twice daily  . apixaban (ELIQUIS) 5 MG TABS tablet Take 1 tablet (5 mg total) by mouth 2 (two) times daily.  Marland Kitchen  atorvastatin (LIPITOR) 40 MG tablet TAKE 1 TABLET BY MOUTH EVERY DAY  . calcium gluconate 500 MG tablet Take 1 tablet by mouth daily.  . carvedilol (COREG) 12.5 MG tablet Take 1 tablet (12.5 mg total) by mouth 2 (two) times daily with a meal.  . cholecalciferol (VITAMIN D) 1000 units tablet Take 1 tablet (1,000 Units total) by mouth daily.  . Fluticasone-Salmeterol (ADVAIR DISKUS) 100-50 MCG/DOSE AEPB INHALE 1 PUFF INTO THE LUNGS TWICE A DAY  . furosemide (LASIX) 20 MG tablet Take 1 tablet (20 mg total) by mouth daily.  Marland Kitchen omeprazole (PRILOSEC) 20 MG capsule Take 1 capsule (20 mg total) by mouth daily.  . polyethylene glycol  powder (GLYCOLAX/MIRALAX) powder Take 255 g by mouth 1 day or 1 dose.  . sacubitril-valsartan (ENTRESTO) 24-26 MG Take 1 tablet by mouth 2 (two) times daily.  . traMADol (ULTRAM) 50 MG tablet TAKE 2 TABLETS BY MOUTH 3 TIMES A DAY AS NEEDED FOR PAIN  . [DISCONTINUED] amiodarone (PACERONE) 200 MG tablet Take 2 tablets (400mg ) twice a day for one week, then 1 tablet (200mg ) twice daily for 2 weeks  . [DISCONTINUED] carvedilol (COREG) 12.5 MG tablet Take 1 tablet (12.5 mg total) by mouth 2 (two) times daily with a meal.  . [DISCONTINUED] furosemide (LASIX) 20 MG tablet Take 1 tablet (20 mg total) by mouth daily.     Allergies: No Known Allergies  Social History: The patient  reports that she quit smoking about 29 years ago. She has never used smokeless tobacco. She reports that she does not drink alcohol or use drugs.   Family History: The patient's family history includes Alcohol abuse in her brother and father; Arthritis in her mother; Asthma in her maternal aunt; COPD in her brother; Cancer in her mother; Diabetes in her maternal aunt; Miscarriages / Stillbirths in her mother.   Review of Systems: Please see the history of present illness.   All other systems are reviewed and negative.   Physical Exam: VS:  BP 100/70   Pulse (!) 104   Ht 5\' 4"  (1.626 m)   Wt 152 lb (68.9 kg)   SpO2 96%   BMI 26.09 kg/m  .  BMI Body mass index is 26.09 kg/m.  Wt Readings from Last 3 Encounters:  01/26/20 152 lb (68.9 kg)  01/19/20 150 lb 11.2 oz (68.4 kg)  01/03/20 156 lb (70.8 kg)    General: Pleasant. Alert and in no acute distress.   Cardiac: Irregular irregular rhythm. Her rate is a little fast.  No edema.  Respiratory:  Lungs are clear to auscultation bilaterally with normal work of breathing.  GI: Soft and nontender.  MS: No deformity or atrophy. Gait and ROM intact. She does walk with a limp.  Skin: Warm and dry. Color is normal.  Neuro:  Strength and sensation are intact and no gross  focal deficits noted.  Psych: Alert, appropriate and with normal affect.   LABORATORY DATA:  EKG:  EKG is ordered today.  Personally reviewed by me. This demonstrates AF - VR of 104 - diffuse T wave changes noted.  Lab Results  Component Value Date   WBC 12.3 (H) 01/14/2020   HGB 12.1 01/14/2020   HCT 39.1 01/14/2020   PLT 335 01/14/2020   GLUCOSE 115 (H) 01/19/2020   CHOL 139 01/14/2020   TRIG 58 01/14/2020   HDL 70 01/14/2020   LDLCALC 57 01/14/2020   ALT 57 (H) 01/13/2020   AST 37 01/13/2020   NA  135 01/19/2020   K 3.9 01/19/2020   CL 100 01/19/2020   CREATININE 1.19 (H) 01/19/2020   BUN 26 (H) 01/19/2020   CO2 25 01/19/2020   TSH 1.525 01/13/2020     BNP (last 3 results) Recent Labs    01/13/20 1418  BNP 983.1*    ProBNP (last 3 results) No results for input(s): PROBNP in the last 8760 hours.   Other Studies Reviewed Today:  Echo: 01/14/20 IMPRESSIONS   1. Left ventricular ejection fraction, by estimation, is 30 to 35%. The  left ventricle has moderately decreased function. The left ventricle  demonstrates global hypokinesis. The left ventricular internal cavity size  was mildly dilated. Left ventricular  diastolic parameters are indeterminate. There is akinesis of the left  ventricular, mid-apical inferior wall, anterior wall, inferolateral wall,  anterolateral wall and lateral wall. There is akinesis of the left  ventricular, apical septal wall and apical  segment.  2. Right ventricular systolic function is mildly reduced. The right  ventricular size is mildly enlarged. There is moderately elevated  pulmonary artery systolic pressure. The estimated right ventricular  systolic pressure is [id number] mmHg.  3. Left atrial size was severely dilated.  4. The mitral valve is normal in structure. Mild to moderate mitral valve  regurgitation. No evidence of mitral stenosis.  5. Tricuspid valve regurgitation is mild to moderate.  6. The aortic valve is  normal in structure. Aortic valve regurgitation is  trivial. No aortic stenosis is present. 7. The inferior vena cava is dilated in size with <50% respiratory  variability, suggesting right atrial pressure of 15 mmHg. _____________     Assessment/Plan:  1. PAF - short lived results with prior cardioversion - now loading with amiodarone - plan to repeat cardioversion in a few weeks - will go ahead and cut this to 200 mg BID - needs repeat lab today. CHADSVASC of 4  2. 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   3. Abnormal EKG  4. HLD - on statin by PCP  5. Chronic anticoagulation - on Eliquis - will try to get her some assistance with this - no problems tolerating. No bleeding.   6. HTN - BP is fine on this regimen. Had been on Benazepril and Imdur prior to admission.   7. CKD stage 3 - rechecking BMET today.   8. Prior chest pain - had never followed thru with cardiology last year - she was placed on Imdur - she may still need an ischemic work up.   9. COVID-19 Education: The signs and symptoms of COVID-19 were discussed with the patient and how to seek care for testing (follow up with PCP or arrange E-visit).  The importance of social distancing, staying at home, hand hygiene and wearing a mask when out in public were discussed today. She has been vaccinated.   Current medicines are reviewed with the patient today.  The patient does not have concerns regarding medicines other than what has been noted above.  The following changes have been made:  See above.  Labs/ tests ordered today include:    Orders Placed This Encounter  Procedures  . Basic metabolic panel  . Hepatic function panel  . EKG 12-Lead     Disposition:   FU with me in about 3 weeks with EKG - will arrange for cardioversion  if fails to convert. Cutting  amiodarone back to 200 mg BID as of tomorrow.     Patient is agreeable to this plan and will call if any problems develop in the interim.   SignedTruitt Merle, NP  01/26/2020 9:47 AM  [facility] [address] [place], [place]  [address] Phone: [phone] Fax: [phone]

## 2020-01-19 NOTE — Discharge Summary (Addendum)
Discharge Summary    Patient ID: Erin Wong,  MRN: [id number], DOB/AGE: [birth year] 69 y.o.  Admit date: 01/13/2020 Discharge date: 01/19/2020  Primary Care Provider: Jenna Luo Wong Primary Cardiologist: Erin Rouge, MD  Discharge Diagnoses    Principal Problem:   Atrial fibrillation with rapid ventricular response [facility]) Active Problems:   HTN (hypertension)   Hyperlipidemia   CKD (chronic kidney disease) stage 3, GFR 30-59 ml/min   Acute systolic heart failure ([place])   Allergies No Known Allergies  Diagnostic Studies/Procedures    Echo: 01/14/20  IMPRESSIONS    1. Left ventricular ejection fraction, by estimation, is 30 to 35%. The  left ventricle has moderately decreased function. The left ventricle  demonstrates global hypokinesis. The left ventricular internal cavity size  was mildly dilated. Left ventricular  diastolic parameters are indeterminate. There is akinesis of the left  ventricular, mid-apical inferior wall, anterior wall, inferolateral wall,  anterolateral wall and lateral wall. There is akinesis of the left  ventricular, apical septal wall and apical  segment.  2. Right ventricular systolic function is mildly reduced. The right  ventricular size is mildly enlarged. There is moderately elevated  pulmonary artery systolic pressure. The estimated right ventricular  systolic pressure is [id number] mmHg.  3. Left atrial size was severely dilated.  4. The mitral valve is normal in structure. Mild to moderate mitral valve  regurgitation. No evidence of mitral stenosis.  5. Tricuspid valve regurgitation is mild to moderate.  6. The aortic valve is normal in structure. Aortic valve regurgitation is  trivial. No aortic stenosis is present. 7. The inferior vena cava is dilated in size with <50% respiratory  variability, suggesting right atrial pressure of 15 mmHg. _____________   History of Present Illness     [name] is a 69 yo female with PMH  of chronic knee pain, HL, HTN, asthma, CKD stage 3 who presented with shortness of breath and palpitations. She had a low risk stress test in 2018 with no signs of ischemia, EF 54%. She saw her PCP 02/2019 for chest pain and was referred to cardiology but never followed-up. At that time she was started on Imdur. She takes atorvastatin for HLD. She takes Imdur and Lotensin for blood pressure. She has no past history of MI, stent, stroke, or DM. Her brother had congenital heart disease.   The patient presented to the ED for 01/13/20 for palpitations and sob. Symptoms started 1 week prior to admission. Noticed sob on exertion and had to stop and catch her breath. She also felt tired and more fatigued. They went to the beach and felt symptoms got worse. She also had some lower leg edema and some abdominal pain and fullness.  She did have some intermittent palpitations and dull chest discomfort. The morning of admission she woke and and could hardly walk to her bathroom she was so sob so she came into the ED. Denied recent fever, chills, illness. She quit smoking about 30 years ago. She drinks 10 beers on the weekends. No drug use.   In the ED BP 107/84, pulse 111, RR 28, 95% O2. EKG showed Afib RVR with rates up to 140. Labs showed glucose 137, creatinine 1.24, WBC 14.2, ALT 57, BNP 983. HS troponin 28. CXR showed mild cardiomegaly, mild aortic atherosclerotic calcification and no active process on exam. She was started on IV dilt and cardiology was consulted for possible admission.   Hospital Course     Consultants: None  1. Atrial fibrillation with rapid ventricular response, paroxysmal: She was admitted and initially started on IV diltiazem with some improvement in HR but remained elevated.  -Tried cardioversion on 01/17/2020 however promptly reverted back to atrial fibrillation shortly thereafter. -Dr.Nishanput her on IV amiodarone after attempted cardioversion. This was converted to p.o. amiodarone  400 twice daily today for 1 week and then 200 twice daily for 2 weeks. Per Dr. Marlou Porch will then plan to repeat cardioversion in 4 weeks after adequate amiodarone load. -Hear rate control remained suboptimal, however we will continue with amiodarone load and plan for cardioversion in 3 to 4 weeks.  She remained asymptomatic with her HR. -- started on Eliquis 5mg  BID given ChadsVasc of at least 4.   2. New onset cardiomyopathy/acute systolic heart failure: BNP 983 on admission. EF noted at 30-35% with global hypokinesis. She responded well to gentle diuresis. Transitioned to lasix 20mg  PO daily.  -Suspected this could potentially be a tachycardia mediated cardiomyopathy. If her EF remains low however, further ischemic work-up will be necessary. -- started on coreg and Entresto with blood pressures tolerating this admission.   3. CKD stage 3: monitored creatinine with diuretics, which improved to 1.19 prior to discharge.  4. HTN: was on Benazapril and Imdur prior to admission. ACE/Imdur were held on admission. Started on coreg which was titrated to 12.5mg  BID. Low dose Entresto was started and tolerated well  5. HLD: on home atorvastatin  _____________  Discharge Vitals Blood pressure 103/73, pulse (!)116, temperature 98.9 F (37.2 C), temperature source Oral, resp. rate 15, height 5\' 4"  (1.626 m), weight 68.4 kg, SpO2 97 %.  Filed Weights   01/17/20 1321 01/18/20 0618 01/19/20 0525  Weight: 68.3 kg 68 kg 68.4 kg    Labs & Radiologic Studies    CBC No results for input(s): WBC, NEUTROABS, HGB, HCT, MCV, PLT in the last 72 hours. Basic Metabolic Panel Recent Labs    01/19/20 0444  NA 135  K 3.9  CL 100  CO2 25  GLUCOSE 115*  BUN 26*  CREATININE 1.19*  CALCIUM 8.7*  MG 1.8   Liver Function Tests No results for input(s): AST, ALT, ALKPHOS, BILITOT, PROT, ALBUMIN in the last 72 hours. No results for input(s): LIPASE, AMYLASE in the last 72 hours. Cardiac Enzymes No results  for input(s): CKTOTAL, CKMB, CKMBINDEX, TROPONINI in the last 72 hours. BNP Invalid input(s): POCBNP D-Dimer No results for input(s): DDIMER in the last 72 hours. Hemoglobin A1C No results for input(s): HGBA1C in the last 72 hours. Fasting Lipid Panel No results for input(s): CHOL, HDL, LDLCALC, TRIG, CHOLHDL, LDLDIRECT in the last 72 hours. Thyroid Function Tests No results for input(s): TSH, T4TOTAL, T3FREE, THYROIDAB in the last 72 hours.  Invalid input(s): FREET3 _____________  Darletta Moll Chest Port 1 View  Result Date: 01/13/2020 CLINICAL DATA:  Short of breath and upper abdominal pain over the last several days. EXAM: PORTABLE CHEST 1 VIEW COMPARISON:  01/13/2017 FINDINGS: Mild cardiomegaly. Mild aortic atherosclerotic calcification. Pulmonary vascularity is normal. Chronic pleural and parenchymal scarring at the left base. No sign of active infiltrate or mass lesion. No acute bone finding. IMPRESSION: Mild cardiomegaly. Mild aortic atherosclerotic calcification. Chronic pleural and parenchymal scarring at the left base. No active process visible on this one view exam. Electronically Signed   By: Nelson Chimes M.D.   On: 01/13/2020 14:57   ECHOCARDIOGRAM COMPLETE  Result Date: 01/14/2020    ECHOCARDIOGRAM REPORT   Patient Name:   Erin Wong  Date of Exam: 01/14/2020 Medical Rec #:  [id number]     Height:       64.0 in Accession #:    [id number]    Weight:       152.1 lb Date of Birth:  24-Jun-1951     BSA:          1.742 m Patient Age:    8 years      BP:           117/72 mmHg Patient Gender: F             HR:           100 bpm. Exam Location:  Inpatient Procedure: 2D Echo and Intracardiac Opacification Agent Indications:     Atrial Fibrillation 427.31 / I48.91  History:         Patient has no prior history of Echocardiogram examinations.                  Signs/Symptoms:Shortness of Breath; Risk Factors:Hypertension                  and Dyslipidemia. Palpitations, Chronic kidney disease, Asthma.   Sonographer:     Darlina Sicilian RDCS Referring Phys:  [id number] [name] Diagnosing Phys: Fransico Him MD IMPRESSIONS  1. Left ventricular ejection fraction, by estimation, is 30 to 35%. The left ventricle has moderately decreased function. The left ventricle demonstrates global hypokinesis. The left ventricular internal cavity size was mildly dilated. Left ventricular diastolic parameters are indeterminate. There is akinesis of the left ventricular, mid-apical inferior wall, anterior wall, inferolateral wall, anterolateral wall and lateral wall. There is akinesis of the left ventricular, apical septal wall and apical segment.  2. Right ventricular systolic function is mildly reduced. The right ventricular size is mildly enlarged. There is moderately elevated pulmonary artery systolic pressure. The estimated right ventricular systolic pressure is [id number] mmHg.  3. Left atrial size was severely dilated.  4. The mitral valve is normal in structure. Mild to moderate mitral valve regurgitation. No evidence of mitral stenosis.  5. Tricuspid valve regurgitation is mild to moderate.  6. The aortic valve is normal in structure. Aortic valve regurgitation is trivial. No aortic stenosis is present.  7. The inferior vena cava is dilated in size with <50% respiratory variability, suggesting right atrial pressure of 15 mmHg. FINDINGS  Left Ventricle: Left ventricular ejection fraction, by estimation, is 30 to 35%. The left ventricle has moderately decreased function. The left ventricle demonstrates global hypokinesis. Definity contrast agent was given IV to delineate the left ventricular endocardial borders. The left ventricular internal cavity size was mildly dilated. There is no left ventricular hypertrophy. Left ventricular diastolic parameters are indeterminate. Right Ventricle: The right ventricular size is mildly enlarged. No increase in right ventricular wall thickness. Right ventricular systolic function is mildly reduced.  There is moderately elevated pulmonary artery systolic pressure. The tricuspid regurgitant velocity is 2.89 m/s, and with an assumed right atrial pressure of 15 mmHg, the estimated right ventricular systolic pressure is [id number] mmHg. Left Atrium: Left atrial size was severely dilated. Right Atrium: Right atrial size was normal in size. Pericardium: There is no evidence of pericardial effusion. Mitral Valve: The mitral valve is normal in structure. Normal mobility of the mitral valve leaflets. Mild to moderate mitral valve regurgitation. No evidence of mitral valve stenosis. Tricuspid Valve: The tricuspid valve is normal in structure. Tricuspid valve regurgitation is mild to moderate. No evidence of tricuspid stenosis. Aortic Valve: The  aortic valve is normal in structure. Aortic valve regurgitation is trivial. No aortic stenosis is present. Pulmonic Valve: The pulmonic valve was normal in structure. Pulmonic valve regurgitation is trivial. No evidence of pulmonic stenosis. Aorta: The aortic root is normal in size and structure. Venous: The inferior vena cava is dilated in size with less than 50% respiratory variability, suggesting right atrial pressure of 15 mmHg. IAS/Shunts: No atrial level shunt detected by color flow Doppler.  LEFT VENTRICLE PLAX 2D LVIDd:         5.50 cm LVIDs:         4.40 cm LV PW:         0.70 cm LV IVS:        1.00 cm LVOT diam:     2.20 cm LV SV:         39 LV SV Index:   22 LVOT Area:     3.80 cm  LV Volumes (MOD) LV vol d, MOD A2C: 103.0 ml LV vol d, MOD A4C: 123.0 ml LV vol s, MOD A2C: 80.5 ml LV vol s, MOD A4C: 75.7 ml LV SV MOD A2C:     22.5 ml LV SV MOD A4C:     123.0 ml LV SV MOD BP:      35.8 ml RIGHT VENTRICLE TAPSE (M-mode): 1.0 cm LEFT ATRIUM             Index LA diam:        4.70 cm 2.70 cm/m LA Vol (A2C):   82.7 ml 47.49 ml/m LA Vol (A4C):   85.4 ml 49.04 ml/m LA Biplane Vol: 91.4 ml 52.48 ml/m  AORTIC VALVE LVOT Vmax:   64.00 cm/s LVOT Vmean:  43.600 cm/s LVOT VTI:    0.102  m  AORTA Ao Root diam: 3.10 cm Ao Asc diam:  3.50 cm MITRAL VALVE                 TRICUSPID VALVE MV Area (PHT): 5.21 cm      TR Peak grad:   33.4 mmHg MV Decel Time: 146 msec      TR Vmax:        289.00 cm/s MR Peak grad:    87.6 mmHg MR Mean grad:    61.0 mmHg   SHUNTS MR Vmax:         468.00 cm/s Systemic VTI:  0.10 m MR Vmean:        377.0 cm/s  Systemic Diam: 2.20 cm MR PISA:         1.01 cm MR PISA Eff ROA: 7 mm MR PISA Radius:  0.40 cm MV E velocity: 102.63 cm/s Fransico Him MD Electronically signed by Fransico Him MD Signature Date/Time: 01/14/2020/10:19:01 AM    Final (Updated)    ECHO TEE  Result Date: 01/17/2020    TRANSESOPHOGEAL ECHO REPORT   Patient Name:   Erin Wong Date of Exam: 01/17/2020 Medical Rec #:  [id number]     Height:       64.0 in Accession #:    [id number]    Weight:       150.6 lb Date of Birth:  1951/02/19     BSA:          1.734 m Patient Age:    55 years      BP:           95/66 mmHg Patient Gender: F             HR:  144 bpm. Exam Location:  Inpatient Procedure: Transesophageal Echo and TEE/DCC Indications:    Atrial fibrillation  History:        Patient has prior history of Echocardiogram examinations.                 Arrythmias:Atrial Fibrillation.  Sonographer:    Dustin Flock Referring Phys: [place]: The transesophogeal probe was passed without difficulty through the esophogus of the patient. Sedation performed by different physician. The patient's vital signs; including heart rate, blood pressure, and oxygen saturation; remained stable throughout the procedure. The patient developed no complications during the procedure. IMPRESSIONS  1. Patient in rapid afib. No LAA thrombus On Eliquis DCC x 1 150J biphasic converted wit NSR with PaC;s and salvo's of recurrent PACls BP somewhat low post conversions Rx with one bolus Neo and will start amiodarone drip.  2. Left ventricular ejection fraction, by estimation, is 30 to 35%. The left  ventricle has moderately decreased function. The left ventricle demonstrates global hypokinesis. The left ventricular internal cavity size was moderately dilated. Left ventricular diastolic parameters are indeterminate.  3. Right ventricular systolic function is moderately reduced. The right ventricular size is moderately enlarged.  4. Left atrial size was moderately dilated. No left atrial/left atrial appendage thrombus was detected.  5. Right atrial size was moderately dilated.  6. The mitral valve is normal in structure. Moderate to severe mitral valve regurgitation. No evidence of mitral stenosis.  7. Tricuspid valve regurgitation is moderate.  8. The aortic valve is tricuspid. Aortic valve regurgitation is trivial. No aortic stenosis is present.  9. The inferior vena cava is normal in size with greater than 50% respiratory variability, suggesting right atrial pressure of 3 mmHg. Conclusion(s)/Recommendation(s): Normal biventricular function without evidence of hemodynamically significant valvular heart disease. FINDINGS  Left Ventricle: Left ventricular ejection fraction, by estimation, is 30 to 35%. The left ventricle has moderately decreased function. The left ventricle demonstrates global hypokinesis. The left ventricular internal cavity size was moderately dilated. There is no left ventricular hypertrophy. Left ventricular diastolic parameters are indeterminate. Right Ventricle: The right ventricular size is moderately enlarged. Right vetricular wall thickness was not assessed. Right ventricular systolic function is moderately reduced. Left Atrium: Left atrial size was moderately dilated. No left atrial/left atrial appendage thrombus was detected. Right Atrium: Right atrial size was moderately dilated. Pericardium: There is no evidence of pericardial effusion. Mitral Valve: The mitral valve is normal in structure. Normal mobility of the mitral valve leaflets. Moderate to severe mitral valve regurgitation.  No evidence of mitral valve stenosis. Tricuspid Valve: The tricuspid valve is normal in structure. Tricuspid valve regurgitation is moderate . No evidence of tricuspid stenosis. Aortic Valve: The aortic valve is tricuspid. Aortic valve regurgitation is trivial. No aortic stenosis is present. Pulmonic Valve: The pulmonic valve was normal in structure. Pulmonic valve regurgitation is mild. No evidence of pulmonic stenosis. Aorta: The aortic root is normal in size and structure. Venous: The inferior vena cava is normal in size with greater than 50% respiratory variability, suggesting right atrial pressure of 3 mmHg. IAS/Shunts: No atrial level shunt detected by color flow Doppler.  MR Peak grad:   97.6 mmHg MR Mean grad:   56.0 mmHg MR Vmax:        494.00 cm/s MR Vmean:       338.0 cm/s MR PISA:        2.26 cm MR PISA Radius: 0.60 cm Erin Rouge MD Electronically signed by Erin Rouge  MD Signature Date/Time: 01/17/2020/2:22:16 PM    Final    US Abdomen Limited RUQ  Result Date: 01/13/2020 CLINICAL DATA:  Elevated liver function tests. EXAM: ULTRASOUND ABDOMEN LIMITED RIGHT UPPER QUADRANT COMPARISON:  None. FINDINGS: Gallbladder: No gallstones or wall thickening visualized. No sonographic Murphy sign noted by sonographer. Common bile duct: Diameter: 5 mm which is within normal limits. Liver: No focal lesion identified. Within normal limits in parenchymal echogenicity. Portal vein is patent on color Doppler imaging with normal direction of blood flow towards the liver. Other: None. IMPRESSION: No definite abnormality seen in the right upper quadrant of the abdomen. Electronically Signed   By: Marijo Conception M.D.   On: 01/13/2020 18:01   Disposition   Pt is being discharged home today in good condition.  Follow-up Plans & Appointments    Follow-up Information    Burtis Junes, NP Follow up on 01/26/2020.   Specialties: Nurse Practitioner, Interventional Cardiology, Cardiology, Radiology Why: 9:15am,  arrive 15 min early Contact information: [place] [place] [address] [phone]          Discharge Instructions    Amb referral to AFIB Clinic   Complete by: As directed    Diet - low sodium heart healthy   Complete by: As directed    Discharge instructions   Complete by: As directed    If you notice any bleeding such as blood in stool, black tarry stools, blood in urine, nosebleeds or any other unusual bleeding, call your doctor immediately. It is not normal to have this kind of bleeding while on a blood thinner and usually indicates there is an underlying problem with one of your body systems that needs to be checked out.   Increase activity slowly   Complete by: As directed       Discharge Medications   Allergies as of 01/19/2020   No Known Allergies     Medication List    STOP taking these medications   benazepril 20 MG tablet Commonly known as: LOTENSIN   fluticasone 50 MCG/ACT nasal spray Commonly known as: FLONASE   isosorbide mononitrate 30 MG 24 hr tablet Commonly known as: IMDUR     TAKE these medications   acetaminophen 650 MG CR tablet Commonly known as: TYLENOL Take 2 tablets (1,300 mg total) by mouth 2 (two) times daily as needed for pain.   albuterol 108 (90 Base) MCG/ACT inhaler Commonly known as: VENTOLIN HFA Inhale 1 puff into the lungs every 6 (six) hours as needed for wheezing or shortness of breath.   amiodarone 200 MG tablet Commonly known as: PACERONE Take 2 tablets (400mg ) twice a day for one week, then 1 tablet (200mg ) twice daily for 2 weeks   apixaban 5 MG Tabs tablet Commonly known as: ELIQUIS Take 1 tablet (5 mg total) by mouth 2 (two) times daily.   atorvastatin 40 MG tablet Commonly known as: LIPITOR TAKE 1 TABLET BY MOUTH EVERY DAY   Calcium 500 MG tablet Take 1 tablet (500 mg total) by mouth 3 (three) times daily. What changed: when to take this   carvedilol 12.5 MG tablet Commonly known as:  COREG Take 1 tablet (12.5 mg total) by mouth 2 (two) times daily with a meal.   cholecalciferol 1000 units tablet Commonly known as: VITAMIN D Take 1 tablet (1,000 Units total) by mouth daily.   Fluticasone-Salmeterol 100-50 MCG/DOSE Aepb Commonly known as: Advair Diskus INHALE 1 PUFF INTO THE LUNGS TWICE A DAY  What changed:   how much to take  how to take this  when to take this  additional instructions   furosemide 20 MG tablet Commonly known as: LASIX Take 1 tablet (20 mg total) by mouth daily. Start taking on: Jan 20, 2020   omeprazole 20 MG capsule Commonly known as: PRILOSEC Take 1 capsule (20 mg total) by mouth daily.   polyethylene glycol powder 17 GM/SCOOP powder Commonly known as: GLYCOLAX/MIRALAX Take 255 g by mouth 1 day or 1 dose. What changed:   how much to take  when to take this   sacubitril-valsartan 24-26 MG Commonly known as: ENTRESTO Take 1 tablet by mouth 2 (two) times daily.   traMADol 50 MG tablet Commonly known as: ULTRAM TAKE 2 TABLETS BY MOUTH 3 TIMES A DAY AS NEEDED FOR PAIN What changed: See the new instructions.       Plan for outpatient DCCV in 3-4 weeks after loaded with amiodarone.   Duration of Discharge Encounter   Greater than 30 minutes including physician time.  Signed, Reino Bellis NP-C 01/19/2020, 11:36 AM  Personally seen and examined. Agree with above.   Feels well once again, no fevers chills nausea vomiting syncope bleeding orthopnea shortness of breath.  Inpatient Medications  Scheduled Meds:  . amiodarone 400 mg Oral Daily  . apixaban 5 mg Oral BID  . atorvastatin 40 mg Oral Daily  . carvedilol 12.5 mg Oral BID WC  . fluticasone 2 spray Each Nare Daily  . furosemide 20 mg Oral Daily  . mometasone-formoterol 2 puff Inhalation BID  . pantoprazole 40 mg Oral Daily  . psyllium 1 packet Oral Daily  . sacubitril-valsartan 1 tablet Oral BID  . sodium chloride flush 3 mL Intravenous Q12H   Continuous  Infusions:  . sodium chloride    PRN Meds:  sodium chloride, acetaminophen, albuterol, ALPRAZolam, ondansetron (ZOFRAN) IV, simethicone, sodium chloride flush, zolpidem  Vital Signs         Vitals:   01/19/20 0025 01/19/20 0525 01/19/20 0817 01/19/20 0847  BP: 108/74 106/73 100/60 103/73  Pulse: 95 93 (!) 42   Resp: 17 15    Temp: 98.3 F (36.8 C) 98.3 F (36.8 C) 98.9 F (37.2 C)   TempSrc: Oral Oral Oral   SpO2: 94% 96% 97%   Weight:  68.4 kg    Height:        Intake/Output Summary (Last 24 hours) at 01/19/2020 0925  Last data filed at 01/18/2020 1045     Gross per 24 hour  Intake 129.05 ml  Output --  Net 129.05 ml   Last 3 Weights 01/19/2020 01/18/2020 01/17/2020  Weight (lbs) 150 lb 11.2 oz 149 lb 14.4 oz 150 lb 9.2 oz  Weight (kg) 68.357 kg 67.994 kg 68.3 kg   Telemetry  A. fib 100s-140s personally Reviewed  ECG  No new- Personally Reviewed  Physical Exam  GEN: No acute distress.  Neck: No JVD  Cardiac: Irregularly irregular tachycardic, no murmurs, rubs, or gallops.  Respiratory: Clear to auscultation bilaterally.  GI: Soft, nontender, non-distended  MS: No edema; No deformity.  Neuro: Nonfocal  Psych: Normal affect  Labs  High Sensitivity Troponin:  Last Labs       Recent Labs  Lab 01/13/20  1403 01/13/20  1556  TROPONINIHS 28* 24*  Chemistry  Last Labs          Recent Labs  Lab 01/13/20  1403 01/14/20  0601 01/14/20  1200 01/16/20  1056 01/19/20  0444  NA 135 < > 136 132* 135  K 4.1 < > 4.0 4.4 3.9  CL 102 < > 100 95* 100  CO2 22 < > 25 26 25   GLUCOSE 137* < > 154* 148* 115*  BUN 24* < > 26* 19 26*  CREATININE 1.24* < > 1.20* 1.30* 1.19*  CALCIUM 9.0 < > 9.0 9.0 8.7*  PROT 6.3* --  --  --  --   ALBUMIN 3.7 --  --  --  --   AST 37 --  --  --  --   ALT 57* --  --  --  --   ALKPHOS 75 --  --  --  --   BILITOT 1.2 --  --  --  --   GFRNONAA 44* < > 46* 42* 47*  GFRAA 51* < > 53* 48* 54*  ANIONGAP 11 < > 11 11 10   < > = values in this  interval not displayed.  Hematology  Last Labs       Recent Labs  Lab 01/13/20  1403 01/14/20  0601  WBC 14.2* 12.3*  RBC 4.07 3.84*  HGB 13.0 12.1  HCT 41.4 39.1  MCV 101.7* 101.8*  MCH 31.9 31.5  MCHC 31.4 30.9  RDW 13.9 14.1  PLT 348 335  BNP  Last Labs      Recent Labs  Lab 01/13/20  1418  BNP 983.1*  DDimer  Last Labs   No results for input(s): DDIMER in the last 168 hours.   Radiology   Imaging Results (Last 48 hours)     Cardiac Studies  Echo EF 30 to 35%  Patient Profile   69 y.o. female here with atrial fibrillation rapid ventricular response with new  onset cardiomyopathy EF 30 to 35% with chronic anticoagulation.  Assessment & Plan   Atrial fibrillation with rapid ventricular response, paroxysmal  -Tried cardioversion on 01/17/2020 however promptly reverted back to atrial fibrillation shortly thereafter.  -Dr. Johnsie Cancel put her on IV amiodarone yesterday, we will convert to p.o. amiodarone 400 twice daily today for 1 week and then 200 twice daily for 2 weeks and then repeat cardioversion in 4 weeks after adequate amiodarone load.  -Rate control remains suboptimal, however we will continue with amiodarone load and plan for cardioversion in 3 to 4 weeks. She is asymptomatic currently.  New onset cardiomyopathy/acute systolic heart failure  -Hopefully once we are able to establish rhythm control, her EF will follow. This could potentially be a tachycardia mediated cardiomyopathy. If her EF remains low however, further ischemic work-up will be necessary.  Okay for discharge based upon lack of current symptoms. Ideally we would have optimal rate control however we will continue with increased dose of carvedilol as well as amiodarone.  For questions or updates, please contact [place]  Please consult [email or web address] for contact info under  Signed,  Candee Furbish, MD

## 2020-01-19 NOTE — Progress Notes (Signed)
Pt's stable, DC home via wheelchair 

## 2020-01-21 ENCOUNTER — Telehealth: Payer: Self-pay | Admitting: Nurse Practitioner

## 2020-01-21 NOTE — Telephone Encounter (Addendum)
Left a message for the patient explaining our current visitation policy and advised her to call back with any questions.

## 2020-01-21 NOTE — Telephone Encounter (Signed)
Will forward this call to [place] for further determination as to if the pts Husband may attend the appt with her, to see  [name] on 5/26.

## 2020-01-21 NOTE — Telephone Encounter (Signed)
New Message    Pt is calling and says her husband will need to assist her to her appt because he goes to all of her appointments     Please advise

## 2020-01-21 NOTE — Telephone Encounter (Signed)
New message   Patient husband states that he will be coming with her to the appt with Truitt Merle due to patient does not understand well.

## 2020-01-26 ENCOUNTER — Telehealth: Payer: Self-pay | Admitting: *Deleted

## 2020-01-26 ENCOUNTER — Other Ambulatory Visit: Payer: Self-pay

## 2020-01-26 ENCOUNTER — Telehealth (HOSPITAL_COMMUNITY): Payer: Self-pay

## 2020-01-26 ENCOUNTER — Ambulatory Visit (INDEPENDENT_AMBULATORY_CARE_PROVIDER_SITE_OTHER): Payer: Medicare Other | Admitting: Nurse Practitioner

## 2020-01-26 ENCOUNTER — Encounter: Payer: Self-pay | Admitting: Nurse Practitioner

## 2020-01-26 VITALS — BP 100/70 | HR 104 | Ht 64.0 in | Wt 152.0 lb

## 2020-01-26 DIAGNOSIS — I10 Essential (primary) hypertension: Secondary | ICD-10-CM | POA: Diagnosis not present

## 2020-01-26 DIAGNOSIS — E782 Mixed hyperlipidemia: Secondary | ICD-10-CM | POA: Diagnosis not present

## 2020-01-26 DIAGNOSIS — I480 Paroxysmal atrial fibrillation: Secondary | ICD-10-CM | POA: Diagnosis not present

## 2020-01-26 DIAGNOSIS — Z79899 Other long term (current) drug therapy: Secondary | ICD-10-CM

## 2020-01-26 DIAGNOSIS — Z7189 Other specified counseling: Secondary | ICD-10-CM | POA: Diagnosis not present

## 2020-01-26 DIAGNOSIS — I519 Heart disease, unspecified: Secondary | ICD-10-CM | POA: Diagnosis not present

## 2020-01-26 DIAGNOSIS — Z7901 Long term (current) use of anticoagulants: Secondary | ICD-10-CM

## 2020-01-26 LAB — BASIC METABOLIC PANEL
BUN/Creatinine Ratio: 17 (ref 12–28)
BUN: 19 mg/dL (ref 8–27)
CO2: 22 mmol/L (ref 20–29)
Calcium: 9 mg/dL (ref 8.7–10.3)
Chloride: 101 mmol/L (ref 96–106)
Creatinine, Ser: 1.14 mg/dL — ABNORMAL HIGH (ref 0.57–1.00)
GFR calc Af Amer: 57 mL/min/{1.73_m2} — ABNORMAL LOW (ref >59)
GFR calc non Af Amer: 49 mL/min/{1.73_m2} — ABNORMAL LOW (ref >59)
Glucose: 97 mg/dL (ref 65–99)
Potassium: 4.3 mmol/L (ref 3.5–5.2)
Sodium: 138 mmol/L (ref 134–144)

## 2020-01-26 LAB — HEPATIC FUNCTION PANEL
ALT: 25 IU/L (ref 0–32)
AST: 18 IU/L (ref 0–40)
Albumin: 4 g/dL (ref 3.8–4.8)
Alkaline Phosphatase: 74 IU/L (ref 48–121)
Bilirubin Total: 0.4 mg/dL (ref 0.0–1.2)
Bilirubin, Direct: 0.14 mg/dL (ref 0.00–0.40)
Total Protein: 6.1 g/dL (ref 6.0–8.5)

## 2020-01-26 MED ORDER — AMIODARONE HCL 200 MG PO TABS: ORAL_TABLET | ORAL | 3 refills | Status: DC

## 2020-01-26 MED ORDER — CARVEDILOL 12.5 MG PO TABS: 12.5000 mg | ORAL_TABLET | Freq: Two times a day (BID) | ORAL | 3 refills | Status: DC

## 2020-01-26 MED ORDER — FUROSEMIDE 20 MG PO TABS: 20.0000 mg | ORAL_TABLET | Freq: Every day | ORAL | 3 refills | Status: DC

## 2020-01-26 NOTE — Telephone Encounter (Signed)
Pt came in today for ov with Truitt Merle, NP with paperwork for entresto.  Paperwork was given to Janan Halter, Clinical supervisor to fax to [employer], LPN.  Claiborne Billings was going to put in Dr. Johnsie Cancel box after this was faxed to [facility].  Pt also came in with farxiga paperwork does not look like pt was started on this.

## 2020-01-26 NOTE — Telephone Encounter (Signed)
Dr. Johnsie Cancel is back in the office 6/2 and will have him sigh before scanning to [facility] to ensure all papers are correct.  I have given the papers to Howie Ill for MD to sign.

## 2020-01-26 NOTE — Progress Notes (Deleted)
CARDIOLOGY OFFICE NOTE  Date:  01/26/2020    [name] Date of Birth: [birth year] Medical Record [id number]  PCP:  [name], MD  Cardiologist:  Servando Snare & ***    No chief complaint on file.   History of Present Illness: AIMY STEPIEN is a 69 y.o. female who presents today for a *** Echo: 01/14/20  IMPRESSIONS    1. Left ventricular ejection fraction, by estimation, is 30 to 35%. The  left ventricle has moderately decreased function. The left ventricle  demonstrates global hypokinesis. The left ventricular internal cavity size  was mildly dilated. Left ventricular  diastolic parameters are indeterminate. There is akinesis of the left  ventricular, mid-apical inferior wall, anterior wall, inferolateral wall,  anterolateral wall and lateral wall. There is akinesis of the left  ventricular, apical septal wall and apical  segment.  2. Right ventricular systolic function is mildly reduced. The right  ventricular size is mildly enlarged. There is moderately elevated  pulmonary artery systolic pressure. The estimated right ventricular  systolic pressure is [id number] mmHg.  3. Left atrial size was severely dilated.  4. The mitral valve is normal in structure. Mild to moderate mitral valve  regurgitation. No evidence of mitral stenosis.  5. Tricuspid valve regurgitation is mild to moderate.  6. The aortic valve is normal in structure. Aortic valve regurgitation is  trivial. No aortic stenosis is present. 7. The inferior vena cava is dilated in size with <50% respiratory  variability, suggesting right atrial pressure of 15 mmHg. _____________   History of Present Illness     Ms.Motz is a 69 yo female with PMH of chronic knee pain, HL, HTN, asthma, CKD stage 3 who presented with shortness of breath and palpitations. Shehad a low risk stress test in 2018 with no signs of ischemia, EF 54%. She saw her PCP 02/2019 for chest pain and was referred to cardiology but  neverfollowed-up.At that time she was started on Imdur.She takes atorvastatin for HLD. She takes Imdur and Lotensin for blood pressure. She has no past history of MI, stent, stroke, or DM. Her brother had congenital heart disease.  The patient presented to the ED for 01/13/20 for palpitations and sob.Symptoms started 1 week prior to admission. Noticed sob on exertion and had to stop and catch her breath. She also felt tired and more fatigued. They went to the beach and felt symptoms got worse. She also had some lower leg edema and some abdominal pain and fullness.She did have some intermittent palpitations and dull chest discomfort.The morning of admission she woke and and could hardly walk to her bathroom she was so sob so she came into the ED. Denied recent fever, chills, illness. She quit smoking about 30 years ago. She drinks 10 beers on the weekends. No drug use.   In the ED BP 107/84, pulse 111, RR 28, 95% O2. EKG showed Afib RVR with rates up to 140. Labs showed glucose 137, creatinine 1.24, WBC 14.2, ALT 57, BNP 983. HS troponin 28.CXR showed mild cardiomegaly, mild aortic atherosclerotic calcification and no active process on exam.She was started on IV dilt and cardiology was consulted for possible admission.   Hospital Course     Consultants: None  1. Atrial fibrillation with rapid ventricular response, paroxysmal: She was admitted and initially started on IV diltiazem with some improvement in HR but remained elevated.  -Tried cardioversion on 01/17/2020 however promptly reverted back to atrial fibrillation shortly thereafter. -Dr.Nishanput her on IV  amiodarone after attempted cardioversion. This was converted to p.o. amiodarone 400 twice daily today for 1 week and then 200 twice daily for 2 weeks. Per Dr. Marlou Porch will then plan to repeat cardioversion in 4 weeks after adequate amiodarone load. -Hear rate control remained suboptimal, however we will continue with amiodarone load  and plan for cardioversion in 3 to 4 weeks. She remained asymptomatic with her HR. -- started on Eliquis 5mg  BID given ChadsVasc of at least 4.   2. New onset cardiomyopathy/acute systolic heart failure: BNP 983 on admission. EF noted at 30-35% with global hypokinesis. She responded well to gentle diuresis. Transitioned to lasix 20mg  PO daily.  -Suspected this could potentially be a tachycardia mediated cardiomyopathy. If her EF remains low however, further ischemic work-up will be necessary. -- started on coreg and Entresto with blood pressures tolerating this admission.   3. CKD stage 3: monitored creatinine with diuretics, which improved to 1.19 prior to discharge.  4. HTN: was on Benazapriland Imdur prior to admission. ACE/Imdur were held on admission. Started on coreg which was titrated to 12.5mg  BID. Low dose Entresto was started and tolerated well  5. HLD: on home atorvastatin   The patient {does/does not:200015} have symptoms concerning for COVID-19 infection (fever, chills, cough, or new shortness of breath).   Comes in today. Here with   Past Medical History:  Diagnosis Date  . Allergy   . Arthritis   . Asthma   . Benign essential HTN   . CKD (chronic kidney disease) stage 3, GFR 30-59 ml/min   . HLD (hyperlipidemia)     Past Surgical History:  Procedure Laterality Date  . ABDOMINAL HYSTERECTOMY  10/04/1995   Hysterectomy and Bilateral oophorectomy  . CARDIOVERSION N/A 01/17/2020   Procedure: CARDIOVERSION;  Surgeon: [name], MD;  Location[facility] ENDOSCOPY;  Service: Cardiovascular;  Laterality: N/A;  . COLON SURGERY  09/2009   hole in colon  . FRACTURE SURGERY Right 08/02/2011   Right Leg--Plates & Screws  . 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 TEE WITHOUT CARDIOVERSION N/A 01/17/2020   Procedure: TRANSESOPHAGEAL ECHOCARDIOGRAM (TEE);  Surgeon: [name], MD;  Location: [facility] ENDOSCOPY;  Service: Cardiovascular;  Laterality: N/A;     Medications: No outpatient medications have been marked as taking for the 02/02/20 encounter (Appointment) with Burtis Junes, NP.     Allergies: No Known Allergies  Social History: The patient  reports that she quit smoking about 29 years ago. She has never used smokeless tobacco. She reports that she does not drink alcohol or use drugs.   Family History: The patient's ***family history includes Alcohol abuse in her brother and father; Arthritis in her mother; Asthma in her maternal aunt; COPD in her brother; Cancer in her mother; Diabetes in her maternal aunt; Miscarriages / Stillbirths in her mother.   Review of Systems: Please see the history of present illness.   All other systems are reviewed and negative.   Physical Exam: VS:  There were no vitals taken for this visit. Marland Kitchen  BMI There is no height or weight on file to calculate BMI.  Wt Readings from Last 3 Encounters:  01/19/20 150 lb 11.2 oz (68.4 kg)  01/03/20 156 lb (70.8 kg)  09/23/19 152 lb (68.9 kg)    General: Pleasant. Well developed, well nourished and in no acute distress.   HEENT: Normal.  Neck:  Supple, no JVD, carotid bruits, or masses noted.  Cardiac: ***Regular rate and rhythm. No murmurs, rubs, or gallops. No edema.  Respiratory:  Lungs are clear to auscultation bilaterally with normal work of breathing.  GI: Soft and nontender.  MS: No deformity or atrophy. Gait and ROM intact.  Skin: Warm and dry. Color is normal.  Neuro:  Strength and sensation are intact and no gross focal deficits noted.  Psych: Alert, appropriate and with normal affect.   LABORATORY DATA:  EKG:  EKG {ACTION; IS/IS GI:[id number] ordered today.  Personally reviewed by me. This demonstrates ***.  Lab Results  Component Value Date   WBC 12.3 (H) 01/14/2020   HGB 12.1 01/14/2020   HCT 39.1 01/14/2020   PLT 335 01/14/2020   GLUCOSE 115 (H)  01/19/2020   CHOL 139 01/14/2020   TRIG 58 01/14/2020   HDL 70 01/14/2020   LDLCALC 57 01/14/2020   ALT 57 (H) 01/13/2020   AST 37 01/13/2020   NA 135 01/19/2020   K 3.9 01/19/2020   CL 100 01/19/2020   CREATININE 1.19 (H) 01/19/2020   BUN 26 (H) 01/19/2020   CO2 25 01/19/2020   TSH 1.525 01/13/2020     BNP (last 3 results) Recent Labs    01/13/20 1418  BNP 983.1*    ProBNP (last 3 results) No results for input(s): PROBNP in the last 8760 hours.   Other Studies Reviewed Today:   Assessment/Plan:  . COVID-19 Education: The signs and symptoms of COVID-19 were discussed with the patient and how to seek care for testing (follow up with PCP or arrange E-visit).  The importance of social distancing, staying at home, hand hygiene and wearing a mask when out in public were discussed today.  Current medicines are reviewed with the patient today.  The patient does not have concerns regarding medicines other than what has been noted above.  The following changes have been made:  See above.  Labs/ tests ordered today include:   No orders of the defined types were placed in this encounter.    Disposition:   FU with *** in {gen number [id number] {Days to years:10300}.   Patient is agreeable to this plan and will call if any problems develop in the interim.   SignedTruitt Merle, NP  01/26/2020 7:46 AM  [facility] [address] [place], [place]  [address] Phone: [phone] Fax: [phone]

## 2020-01-26 NOTE — Telephone Encounter (Signed)
Heart Failure Stewardship Pharmacist  Transitions of Care Follow-up Call   PCP: [name], MD PCP-Cardiologist: Jenkins Rouge, MD    HPI and Hospital Course:  [name] with PMH significant for chronic knee pain, low risk stress test in 2018, HLD, HTN, asthma, CKD III who was admitted on 5/13 for shortness of breath and palpitations for 1 week prior to admission. She was found to be in afib RVR in the ED and was started on IV diltiazem. This was then transitioned to PO. She had an ECHO on 5/14 that found new low LVEF of 30-35% and mildly reduced RV function. Her diltiazem was then transitioned to carvedilol. S/p TEE/DCCV on 5/17 but reverted back to afib. She also received IV furosemide for fluid overload and was transitioned to furosemide 20 mg daily on discharge. She was discharged from [facility] on 5/19.  At her follow up visit with Truitt Merle, NP on 01/26/20, her BP was 100/70 and she was continued on all HF medications. Her amiodarone was reduced to 200 mg BID. She provided her patient assistance paperwork for Entresto to be completed by provider.  Overall, she is feeling very well. She denies dizziness, lightheadedness, fatigue, chest pain and palpitations. She says her breathing is good and denies shortness of breath. She is able to complete all ADLs. She does not check her BP at home but says she will start today. She is taking furosemide 20 mg daily at home. Her reported weights at home are stable 145-146 lbs and she denies PND or orthopnea but does report minimal LE edema in her feet. Her appetite is good. She follows a low sodium diet and takes all medications as prescribed.   Pertinent Lab Values: . Serum creatinine 1.14, BUN 19, Potassium 4.3, Sodium 138, BNP 983.1  HF Medications: Furosemide 20 mg daily Carvedilol 12.5 mg BID Entresto 24/26 mg BID   Did the pt receive and understand the discharge instructions provided? yes  Have you obtained your medications from the pharmacy  after your hospital discharge? Yes, will discuss with her husband about transitioning her mediations over to [place] - provided her with pharmacy phone number  Are you experiencing any side effects to your medications: possibly - she reports a minor rash on her back after discharging from the hospital but says it is improving now  Medication Assistance / Insurance Benefits Check: Does the patient have prescription insurance?   yes Type of insurance plan: Medicare - [employer]  Does the patient qualify for medication assistance through manufacturers or grants?   Yes, patient meets household income requirements for free medications through drug manufacturers  Eligible grants and/or patient assistance programs:  Lisabeth Register - if this is started in the future for HFrEF  Medication assistance applications in progress:  Entresto  Medication assistance applications approved: None  Approved medication assistance renewals will be completed by: Dr. Kyla Balzarine office   Assessment: 1. New onset systolic CHF (EF [id number]). Tachycardia vs ischemic in etiology. NYHA class II symptoms. - Continue furosemide 20 mg daily - Consider carvedilol 12.5 mg BID - Continue Entresto 24/26 mg BID - Consider adding spironolactone at next appointment if BP improves - Consider adding Farxiga in a future visit. Patient was provided with [place] patient assistance paperwork in case this were to be added so she can receive for free through [place] - Instructed pt to keep a BP log and bring to next appointment   Plan: 1) Medication changes recommended at  this time: - Add spironolactone if BP improved at next visit -Provider contacted: Truitt Merle, NP  2) Patient assistance application(s): - [name] is $35 per month - patient provided with patient assistance forms to receive for free through [place] is $35 per month - patient provided with patient assistance forms to  receive for free through [place]  3) Education - Patient has been educated on current HF medications (Entresto, carvedilol, furosemide) and potential additions to HF medications (spironolactone, Wilder Glade) -Patient verbalizes understanding that over the next few months, these medication doses may change and more medications may be added to optimize HF regimen -Patient has been educated on basic disease state pathophysiology and goals of therapy -Time spent (30 mins)  4) Follow Up - Next appointment 6/14 with Truitt Merle, NP  Vertis Kelch, PharmD, BCPS HF Stewardship Pharmacist Phone [phone] 01/26/2020       9:26 AM

## 2020-01-26 NOTE — Patient Instructions (Addendum)
After Visit Summary:  We will be checking the following labs today - BMET and LFTs today  We will do lab at your next visit as well.    Medication Instructions:    Continue with your current medicines.   Samples of Entresto if available.   We will work on your patient assistance forms   Tomorrow you will cut your Amiodarone to 200 mg to take twice a day  I did send in refills for your Coreg, the Lasix and the Amiodarone today.    If you need a refill on your cardiac medications before your next appointment, please call your pharmacy.     Testing/Procedures To Be Arranged:  N/A  Follow-Up:   See [place] back in about 3 weeks with EKG - this will be to arrange for repeat cardioversion    At [facility], you and your health needs are our priority.  As part of our continuing mission to provide you with exceptional heart care, we have created designated Provider Care Teams.  These Care Teams include your primary Cardiologist (physician) and Advanced Practice Providers (APPs -  Physician Assistants and Nurse Practitioners) who all work together to provide you with the care you need, when you need it.  Special Instructions:  . Stay safe, stay home, wash your hands for at least 20 seconds and wear a mask when out in public.  . It was good to talk with you today.  Marland Kitchen Keep restricting your salt.  Marland Kitchen Keep restricting your alcohol . Check some blood pressures occasionally for [place].  Mamie Levers daily   Call the [place] office at [phone] if you have any questions, problems or concerns.

## 2020-01-27 ENCOUNTER — Telehealth: Payer: Self-pay

## 2020-01-27 ENCOUNTER — Other Ambulatory Visit: Payer: Self-pay | Admitting: Family Medicine

## 2020-01-27 NOTE — Telephone Encounter (Signed)
The patient has been notified of the lab result and verbalized understanding.  All questions (if any) were answered. Frederik Schmidt, RN 01/27/2020 8:11 AM

## 2020-01-27 NOTE — Telephone Encounter (Signed)
-----   Message from Burtis Junes, NP sent at 01/26/2020  5:30 PM EDT ----- Please report - Andee Poles is out of the office.   Her labs look good - stay on current regimen and will see back as planned.

## 2020-01-28 DIAGNOSIS — Z1212 Encounter for screening for malignant neoplasm of rectum: Secondary | ICD-10-CM | POA: Diagnosis not present

## 2020-01-28 DIAGNOSIS — Z1211 Encounter for screening for malignant neoplasm of colon: Secondary | ICD-10-CM | POA: Diagnosis not present

## 2020-01-28 LAB — COLOGUARD: Cologuard: POSITIVE — AB

## 2020-02-02 ENCOUNTER — Ambulatory Visit: Payer: Medicare Other | Admitting: Nurse Practitioner

## 2020-02-02 ENCOUNTER — Telehealth: Payer: Self-pay

## 2020-02-02 NOTE — Telephone Encounter (Signed)
Received email. Sent fax.

## 2020-02-02 NOTE — Telephone Encounter (Signed)
**Note De-Identified Bernie Ransford Obfuscation** The pt left a completed [employer] pt asst application at the office. I have completed the provider page of the application, scanned and emailed all to Dr Mariana Arn nurse so she can fax to [employer] pt asst foundation at [employer] written on cover letter included.

## 2020-02-02 NOTE — Progress Notes (Deleted)
CARDIOLOGY OFFICE NOTE  Date:  02/03/2020    Erin Wong Date of Birth: 09/19/1950 Medical Record [id number]  PCP:  [name], MD  Cardiologist:  Servando Snare & ***    No chief complaint on file.   History of Present Illness: Erin Wong is a 69 y.o. female who presents today for a 2 week check. Seen for Dr. Johnsie Cancel (NEW).   She has a history of chronic knee pain, HLD, HTN, asthma, and CKD stage 3.   She presented earlier this month with SOB and palpitations.  Erin Wong had a low risk stress test in 2018 with no signs of ischemia, EF 54%. She saw her PCP 02/2019 for chest pain and was referred to cardiology but neverfollowed-up.At that time she was started on Imdur. She has no past history of MI, stent, stroke, or DM. Her brother had congenital heart disease.Noted alcohol use on the weekends. She was noted to be in AF with RVR in the ER. She was started on IV dilt. Cardioversion yielded short term results - thus amiodarone was started. Plan was to attempt repeat cardioversion after 4 weeks of therapy. Anticoagulation was started. Echo with an EF of 30 to 35% - ?tachycardia mediated CM.   I then saw her for her post hospital visit. Feeling good. No awareness of her AF. Cost of medicines is going to be an issue. She was given patient assistance forms for [facility] but is not really on guideline therapy for her CHF. She was not having alcohol. Still need to consider an ischemic work up due to prior bouts of chest pain - started on Imdur in the past, etc.   The patient {does/does not:200015} have symptoms concerning for COVID-19 infection (fever, chills, cough, or new shortness of breath).   Comes in today. Here with   Past Medical History:  Diagnosis Date  . Allergy   . Arthritis   . Asthma   . Benign essential HTN   . CKD (chronic kidney disease) stage 3, GFR 30-59 ml/min   . HLD (hyperlipidemia)     Past Surgical History:  Procedure Laterality Date  . ABDOMINAL  HYSTERECTOMY  10/04/1995   Hysterectomy and Bilateral oophorectomy  . CARDIOVERSION N/A 01/17/2020   Procedure: CARDIOVERSION;  Surgeon: [name], MD;  Location: [facility] ENDOSCOPY;  Service: Cardiovascular;  Laterality: N/A;  . COLON SURGERY  09/2009   hole in colon  . FRACTURE SURGERY Right 08/02/2011   Right Leg--Plates & Screws  . 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 TEE WITHOUT CARDIOVERSION N/A 01/17/2020   Procedure: TRANSESOPHAGEAL ECHOCARDIOGRAM (TEE);  Surgeon: [name], MD;  Location: [facility] ENDOSCOPY;  Service: Cardiovascular;  Laterality: N/A;     Medications: No outpatient medications have been marked as taking for the 02/14/20 encounter (Appointment) with Burtis Junes, NP.     Allergies: No Known Allergies  Social History: The patient  reports that she quit smoking about 29 years ago. She has never used smokeless tobacco. She reports that she does not drink alcohol or use drugs.   Family History: The patient's ***family history includes Alcohol abuse in her brother and father; Arthritis in her mother; Asthma in her maternal aunt; COPD in her brother; Cancer in her mother; Diabetes in her maternal aunt; Miscarriages / Stillbirths in her mother.   Review of Systems: Please see the history of present  illness.   All other systems are reviewed and negative.   Physical Exam: VS:  There were no vitals taken for this visit. Marland Kitchen  BMI There is no height or weight on file to calculate BMI.  Wt Readings from Last 3 Encounters:  01/26/20 152 lb (68.9 kg)  01/19/20 150 lb 11.2 oz (68.4 kg)  01/03/20 156 lb (70.8 kg)    General: Pleasant. Well developed, well nourished and in no acute distress.   HEENT: Normal.  Neck: Supple, no JVD, carotid bruits, or masses noted.  Cardiac: ***Regular rate and rhythm. No murmurs, rubs, or gallops. No edema.  Respiratory:  Lungs are clear  to auscultation bilaterally with normal work of breathing.  GI: Soft and nontender.  MS: No deformity or atrophy. Gait and ROM intact.  Skin: Warm and dry. Color is normal.  Neuro:  Strength and sensation are intact and no gross focal deficits noted.  Psych: Alert, appropriate and with normal affect.   LABORATORY DATA:  EKG:  EKG {ACTION; IS/IS [id number] ordered today.  Personally reviewed by me. This demonstrates ***.  Lab Results  Component Value Date   WBC 12.3 (H) 01/14/2020   HGB 12.1 01/14/2020   HCT 39.1 01/14/2020   PLT 335 01/14/2020   GLUCOSE 97 01/26/2020   CHOL 139 01/14/2020   TRIG 58 01/14/2020   HDL 70 01/14/2020   LDLCALC 57 01/14/2020   ALT 25 01/26/2020   AST 18 01/26/2020   NA 138 01/26/2020   K 4.3 01/26/2020   CL 101 01/26/2020   CREATININE 1.14 (H) 01/26/2020   BUN 19 01/26/2020   CO2 22 01/26/2020   TSH 1.525 01/13/2020     BNP (last 3 results) Recent Labs    01/13/20 1418  BNP 983.1*    ProBNP (last 3 results) No results for input(s): PROBNP in the last 8760 hours.   Other Studies Reviewed Today:  Echo: 01/14/20 IMPRESSIONS   1. Left ventricular ejection fraction, by estimation, is 30 to 35%. The  left ventricle has moderately decreased function. The left ventricle  demonstrates global hypokinesis. The left ventricular internal cavity size  was mildly dilated. Left ventricular  diastolic parameters are indeterminate. There is akinesis of the left  ventricular, mid-apical inferior wall, anterior wall, inferolateral wall,  anterolateral wall and lateral wall. There is akinesis of the left  ventricular, apical septal wall and apical  segment.  2. Right ventricular systolic function is mildly reduced. The right  ventricular size is mildly enlarged. There is moderately elevated  pulmonary artery systolic pressure. The estimated right ventricular  systolic pressure is [id number] mmHg.  3. Left atrial size was severely dilated.  4.  The mitral valve is normal in structure. Mild to moderate mitral valve  regurgitation. No evidence of mitral stenosis.  5. Tricuspid valve regurgitation is mild to moderate.  6. The aortic valve is normal in structure. Aortic valve regurgitation is  trivial. No aortic stenosis is present. 7. The inferior vena cava is dilated in size with <50% respiratory  variability, suggesting right atrial pressure of 15 mmHg. _____________     Assessment/Plan:  1. PAF - short lived results with prior cardioversion - now loading with amiodarone - plan to repeat cardioversion in a few weeks - will go ahead and cut this to 200 mg BID - needs repeat lab today. CHADSVASC of 4  2. 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   3. Abnormal EKG  4. HLD - on statin by PCP  5. Chronic anticoagulation - on Eliquis - will try to get her some assistance with this - no problems tolerating. No bleeding.   6. HTN - BP is fine on this regimen. Had been on Benazepril and Imdur prior to admission.   7. CKD stage 3 - rechecking BMET today.   8. Prior chest pain - had never followed thru with cardiology last year - she was placed on Imdur - she may still need an ischemic work up.  Marland Kitchen COVID-19 Education: The signs and symptoms of COVID-19 were discussed with the patient and how to seek care for testing (follow up with PCP or arrange E-visit).  The importance of social distancing, staying at home, hand hygiene and wearing a mask when out in public were discussed today.  Current medicines are reviewed with the patient today.  The patient does not have concerns regarding medicines other than what has been noted above.  The following changes have been made:  See above.  Labs/ tests ordered today include:   No orders of the defined types were placed in this  encounter.    Disposition:   FU with *** in {gen number [id number] {Days to years:10300}.   Patient is agreeable to this plan and will call if any problems develop in the interim.   SignedTruitt Merle, NP  02/03/2020 8:30 AM  [facility] [address] [place], [place]  [address] Phone: [phone] Fax: [phone]

## 2020-02-09 NOTE — Telephone Encounter (Signed)
**Note De-Identified Erin Wong Obfuscation** Letter received from [place] stating that they did approve the pt for asst with her Entresto. Approval good for the remainder of this year (2021).  The letter states that they have notified the pt of this approval as well.

## 2020-02-10 ENCOUNTER — Ambulatory Visit (INDEPENDENT_AMBULATORY_CARE_PROVIDER_SITE_OTHER): Payer: Medicare Other | Admitting: Nurse Practitioner

## 2020-02-10 ENCOUNTER — Other Ambulatory Visit: Payer: Self-pay

## 2020-02-10 VITALS — BP 110/62 | HR 113 | Temp 97.6°F | Resp 18 | Wt 152.0 lb

## 2020-02-10 DIAGNOSIS — J069 Acute upper respiratory infection, unspecified: Secondary | ICD-10-CM | POA: Diagnosis not present

## 2020-02-10 MED ORDER — FLUTICASONE PROPIONATE 50 MCG/ACT NA SUSP: 2.0000 | Freq: Every day | NASAL | 6 refills | Status: DC

## 2020-02-10 MED ORDER — BENZONATATE 100 MG PO CAPS: 100.0000 mg | ORAL_CAPSULE | Freq: Two times a day (BID) | ORAL | 0 refills | Status: DC | PRN

## 2020-02-10 NOTE — Patient Instructions (Addendum)
Your symptoms are most consistent with viral upper respiratory infection and at this time an antibiotic would not be a benefit. If your sxs persist or worsen you should return as this viral illness may allow environment for bacterial infection that would warrant antibiotic.   Completed Covid testing in clinic today  drink plenty of water, get plenty of rest, treat symptoms with over-the-counter medications such as Tylenol, avoiding NSAIDs related to her medical history to include atrial fibrillation that is being currently managed by cardiology with an appointment on Monday, prescription of Flonase for rhinitis, Tessalon Perles for her cough.  use humidifier at night and seek medical attention for mild resolving or worsening symptoms.  continue using her inhalers as prescribed.

## 2020-02-10 NOTE — Progress Notes (Signed)
Established Patient Office Visit  Subjective:  Patient ID: Erin Wong, female    DOB: 08-Mar-1951  Age: 69 y.o. MRN: [id number]  CC:  Chief Complaint  Patient presents with  . Cough    chest congestion, sob, started 06/05, otc cough meds    HPI Erin Wong is a 69 year old female accompanied by her husband presenting for sxs of nasal congestion, clear nasal drainage, cough. Her sxs started 3-4 days ago. 2 days ago she reports that she has cough phlegm with some yellow color on occasion. She reports being covid vaccinated as of May. Her spouse as well. Both attended a birthday party over the weekend without wearing mask. They both have similar sxs. The pt has a recent onset of Afib and undergoing management /treatment with cardiology with f/u apt on Monday.   No cp, ct, gu/gi sxs, pain, sob, edema, palpitation, or falls recently.  Past Medical History:  Diagnosis Date  . Allergy   . Arthritis   . Asthma   . Benign essential HTN   . CKD (chronic kidney disease) stage 3, GFR 30-59 ml/min   . HLD (hyperlipidemia)     Past Surgical History:  Procedure Laterality Date  . ABDOMINAL HYSTERECTOMY  10/04/1995   Hysterectomy and Bilateral oophorectomy  . CARDIOVERSION N/A 01/17/2020   Procedure: CARDIOVERSION;  Surgeon: [name], MD;  Location: [facility] ENDOSCOPY;  Service: Cardiovascular;  Laterality: N/A;  . COLON SURGERY  09/2009   hole in colon  . FRACTURE SURGERY Right 08/02/2011   Right Leg--Plates & Screws  . 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 TEE WITHOUT CARDIOVERSION N/A 01/17/2020   Procedure: TRANSESOPHAGEAL ECHOCARDIOGRAM (TEE);  Surgeon: [name], MD;  Location: [facility] ENDOSCOPY;  Service: Cardiovascular;  Laterality: N/A;    Family History  Problem Relation Age of Onset  . Arthritis Mother   . Cancer Mother        Lung Cancer--Had quit smoking for 25 years  .  Miscarriages / [place] Mother   . Alcohol abuse Father   . Alcohol abuse Brother   . COPD Brother   . Asthma Maternal Aunt   . Diabetes Maternal Aunt     Social History   Socioeconomic History  . Marital status: Married    Spouse name: Not on file  . Number of children: Not on file  . Years of education: Not on file  . Highest education level: Not on file  Occupational History  . Not on file  Tobacco Use  . Smoking status: Former Smoker    Quit date: 01/10/1991    Years since quitting: 29.1  . Smokeless tobacco: Never Used  Vaping Use  . Vaping Use: Never used  Substance and Sexual Activity  . Alcohol use: No  . Drug use: No  . Sexual activity: Never  Other Topics Concern  . Not on file  Social History Narrative  . Not on file   Social Determinants of Health   Financial Resource Strain:   . Difficulty of Paying Living Expenses:   Food Insecurity:   . Worried About Charity fundraiser in the Last Year:   . Arboriculturist in the Last Year:   Transportation Needs:   . Film/video editor (Medical):   Marland Kitchen Lack of Transportation (Non-Medical):   Physical Activity:   . Days of Exercise per  Week:   . Minutes of Exercise per Session:   Stress:   . Feeling of Stress :   Social Connections:   . Frequency of Communication with Friends and Family:   . Frequency of Social Gatherings with Friends and Family:   . Attends Religious Services:   . Active Member of Clubs or Organizations:   . Attends Archivist Meetings:   Marland Kitchen Marital Status:   Intimate Partner Violence:   . Fear of Current or Ex-Partner:   . Emotionally Abused:   Marland Kitchen Physically Abused:   . Sexually Abused:     Outpatient Medications Prior to Visit  Medication Sig Dispense Refill  . acetaminophen (TYLENOL) 650 MG CR tablet Take 2 tablets (1,300 mg total) by mouth 2 (two) times daily as needed for pain. 120 tablet 3  . albuterol (PROVENTIL HFA;VENTOLIN HFA) 108 (90 Base) MCG/ACT inhaler Inhale  1 puff into the lungs every 6 (six) hours as needed for wheezing or shortness of breath. 18 g 3  . amiodarone (PACERONE) 200 MG tablet Take 1 tablet (200mg ) twice daily 60 tablet 3  . apixaban (ELIQUIS) 5 MG TABS tablet Take 1 tablet (5 mg total) by mouth 2 (two) times daily. 60 tablet 2  . atorvastatin (LIPITOR) 40 MG tablet TAKE 1 TABLET BY MOUTH EVERY DAY 90 tablet 1  . calcium gluconate 500 MG tablet Take 1 tablet by mouth daily.    . carvedilol (COREG) 12.5 MG tablet Take 1 tablet (12.5 mg total) by mouth 2 (two) times daily with a meal. 180 tablet 3  . cholecalciferol (VITAMIN D) 1000 units tablet Take 1 tablet (1,000 Units total) by mouth daily. 90 tablet 1  . Fluticasone-Salmeterol (ADVAIR DISKUS) 100-50 MCG/DOSE AEPB INHALE 1 PUFF INTO THE LUNGS TWICE A DAY 60 each 5  . furosemide (LASIX) 20 MG tablet Take 1 tablet (20 mg total) by mouth daily. 30 tablet 3  . omeprazole (PRILOSEC) 20 MG capsule TAKE 1 CAPSULE BY MOUTH EVERY DAY 90 capsule 3  . polyethylene glycol powder (GLYCOLAX/MIRALAX) powder Take 255 g by mouth 1 day or 1 dose. 255 g 5  . sacubitril-valsartan (ENTRESTO) 24-26 MG Take 1 tablet by mouth 2 (two) times daily. 60 tablet 1  . traMADol (ULTRAM) 50 MG tablet TAKE 2 TABLETS BY MOUTH 3 TIMES A DAY AS NEEDED FOR PAIN 180 tablet 2   No facility-administered medications prior to visit.    No Known Allergies  ROS Review of Systems  All other systems reviewed and are negative.     Objective:    Physical Exam  BP 110/62 (BP Location: Left Arm, Patient Position: Sitting, Cuff Size: Normal)   Pulse (!) 113   Temp 97.6 F (36.4 C) (Temporal)   Resp 18   Wt 152 lb (68.9 kg)   SpO2 93%   BMI 26.09 kg/m  Wt Readings from Last 3 Encounters:  02/10/20 152 lb (68.9 kg)  01/26/20 152 lb (68.9 kg)  01/19/20 150 lb 11.2 oz (68.4 kg)     Health Maintenance Due  Topic Date Due  . COLONOSCOPY  Never done    There are no preventive care reminders to display for this  patient.  Lab Results  Component Value Date   TSH 1.525 01/13/2020   Lab Results  Component Value Date   WBC 12.3 (H) 01/14/2020   HGB 12.1 01/14/2020   HCT 39.1 01/14/2020   MCV 101.8 (H) 01/14/2020   PLT 335 01/14/2020   Lab Results  Component Value Date   NA 138 01/26/2020   K 4.3 01/26/2020   CO2 22 01/26/2020   GLUCOSE 97 01/26/2020   BUN 19 01/26/2020   CREATININE 1.14 (H) 01/26/2020   BILITOT 0.4 01/26/2020   ALKPHOS 74 01/26/2020   AST 18 01/26/2020   ALT 25 01/26/2020   PROT 6.1 01/26/2020   ALBUMIN 4.0 01/26/2020   CALCIUM 9.0 01/26/2020   ANIONGAP 10 01/19/2020   Lab Results  Component Value Date   CHOL 139 01/14/2020   Lab Results  Component Value Date   HDL 70 01/14/2020   Lab Results  Component Value Date   LDLCALC 57 01/14/2020   Lab Results  Component Value Date   TRIG 58 01/14/2020   Lab Results  Component Value Date   CHOLHDL 2.0 01/14/2020   No results found for: HGBA1C    Assessment & Plan:  While patient had Covid vaccine last month she did have a birthday party this past weekend and started having symptoms the day after her husband also had symptoms he did not wear a mask to the party.  Symptoms are consistent with viral URI.  Completed Covid testing in clinic today  Patient instructed to drink plenty of water, get plenty of rest, treat symptoms with over-the-counter medications such as Tylenol, avoiding NSAIDs related to her medical history to include atrial fibrillation that is being currently managed by cardiology with an appointment on Monday, prescription of Flonase for rhinitis, Tessalon Perles for her cough.  Patient encouraged to use humidifier at night and to seek medical attention for mild resolving or worsening symptoms.  Patient to continue using her inhalers as prescribed.   Problem List Items Addressed This Visit    None    Visit Diagnoses    Viral URI with cough    -  Primary   Relevant Medications    fluticasone (FLONASE) 50 MCG/ACT nasal spray   benzonatate (TESSALON) 100 MG capsule   Other Relevant Orders   SARS-COV-2 RNA,(COVID-19) QUAL NAAT      Meds ordered this encounter  Medications  . fluticasone (FLONASE) 50 MCG/ACT nasal spray    Sig: Place 2 sprays into both nostrils daily.    Dispense:  16 g    Refill:  6  . benzonatate (TESSALON) 100 MG capsule    Sig: Take 1 capsule (100 mg total) by mouth 2 (two) times daily as needed for cough.    Dispense:  20 capsule    Refill:  0    Follow-up: Return if symptoms worsen or fail to improve.    Annie Main, FNP

## 2020-02-11 ENCOUNTER — Inpatient Hospital Stay (HOSPITAL_COMMUNITY)
Admit: 2020-02-11 | Discharge: 2020-02-16 | DRG: 291 | Disposition: A | Discharge status: Home / Self Care | Payer: Medicare Other | Attending: Internal Medicine | Admitting: Internal Medicine

## 2020-02-11 ENCOUNTER — Other Ambulatory Visit: Payer: Self-pay

## 2020-02-11 ENCOUNTER — Encounter (HOSPITAL_COMMUNITY): Payer: Self-pay | Admitting: Student

## 2020-02-11 ENCOUNTER — Emergency Department (HOSPITAL_COMMUNITY): Payer: Medicare Other

## 2020-02-11 DIAGNOSIS — Z87891 Personal history of nicotine dependence: Secondary | ICD-10-CM

## 2020-02-11 DIAGNOSIS — Z20822 Contact with and (suspected) exposure to covid-19: Secondary | ICD-10-CM | POA: Diagnosis present

## 2020-02-11 DIAGNOSIS — R05 Cough: Secondary | ICD-10-CM | POA: Diagnosis not present

## 2020-02-11 DIAGNOSIS — I429 Cardiomyopathy, unspecified: Secondary | ICD-10-CM | POA: Diagnosis present

## 2020-02-11 DIAGNOSIS — Z90710 Acquired absence of both cervix and uterus: Secondary | ICD-10-CM

## 2020-02-11 DIAGNOSIS — J219 Acute bronchiolitis, unspecified: Secondary | ICD-10-CM

## 2020-02-11 DIAGNOSIS — Z7901 Long term (current) use of anticoagulants: Secondary | ICD-10-CM

## 2020-02-11 DIAGNOSIS — I5043 Acute on chronic combined systolic (congestive) and diastolic (congestive) heart failure: Secondary | ICD-10-CM | POA: Diagnosis present

## 2020-02-11 DIAGNOSIS — I4819 Other persistent atrial fibrillation: Secondary | ICD-10-CM | POA: Diagnosis present

## 2020-02-11 DIAGNOSIS — J9601 Acute respiratory failure with hypoxia: Secondary | ICD-10-CM | POA: Diagnosis present

## 2020-02-11 DIAGNOSIS — I517 Cardiomegaly: Secondary | ICD-10-CM | POA: Diagnosis not present

## 2020-02-11 DIAGNOSIS — J209 Acute bronchitis, unspecified: Secondary | ICD-10-CM | POA: Diagnosis present

## 2020-02-11 DIAGNOSIS — Z9081 Acquired absence of spleen: Secondary | ICD-10-CM

## 2020-02-11 DIAGNOSIS — E871 Hypo-osmolality and hyponatremia: Secondary | ICD-10-CM | POA: Diagnosis present

## 2020-02-11 DIAGNOSIS — R0602 Shortness of breath: Secondary | ICD-10-CM | POA: Diagnosis not present

## 2020-02-11 DIAGNOSIS — I5021 Acute systolic (congestive) heart failure: Secondary | ICD-10-CM | POA: Diagnosis present

## 2020-02-11 DIAGNOSIS — J189 Pneumonia, unspecified organism: Secondary | ICD-10-CM | POA: Diagnosis present

## 2020-02-11 DIAGNOSIS — E785 Hyperlipidemia, unspecified: Secondary | ICD-10-CM | POA: Diagnosis present

## 2020-02-11 DIAGNOSIS — R0600 Dyspnea, unspecified: Secondary | ICD-10-CM

## 2020-02-11 DIAGNOSIS — I4891 Unspecified atrial fibrillation: Secondary | ICD-10-CM | POA: Diagnosis present

## 2020-02-11 DIAGNOSIS — J069 Acute upper respiratory infection, unspecified: Secondary | ICD-10-CM

## 2020-02-11 DIAGNOSIS — N183 Chronic kidney disease, stage 3 unspecified: Secondary | ICD-10-CM | POA: Diagnosis present

## 2020-02-11 DIAGNOSIS — I130 Hypertensive heart and chronic kidney disease with heart failure and stage 1 through stage 4 chronic kidney disease, or unspecified chronic kidney disease: Secondary | ICD-10-CM | POA: Diagnosis not present

## 2020-02-11 DIAGNOSIS — E878 Other disorders of electrolyte and fluid balance, not elsewhere classified: Secondary | ICD-10-CM | POA: Diagnosis present

## 2020-02-11 DIAGNOSIS — I10 Essential (primary) hypertension: Secondary | ICD-10-CM | POA: Diagnosis present

## 2020-02-11 DIAGNOSIS — J45909 Unspecified asthma, uncomplicated: Secondary | ICD-10-CM | POA: Diagnosis present

## 2020-02-11 DIAGNOSIS — Z79899 Other long term (current) drug therapy: Secondary | ICD-10-CM

## 2020-02-11 LAB — LIPASE, BLOOD: Lipase: 20 U/L (ref 11–51)

## 2020-02-11 LAB — BASIC METABOLIC PANEL
Anion gap: 11 (ref 5–15)
BUN: 10 mg/dL (ref 8–23)
CO2: 23 mmol/L (ref 22–32)
Calcium: 8.5 mg/dL — ABNORMAL LOW (ref 8.9–10.3)
Chloride: 92 mmol/L — ABNORMAL LOW (ref 98–111)
Creatinine, Ser: 0.93 mg/dL (ref 0.44–1.00)
GFR calc Af Amer: >60 mL/min (ref >60)
GFR calc non Af Amer: >60 mL/min (ref >60)
Glucose, Bld: 128 mg/dL — ABNORMAL HIGH (ref 70–99)
Potassium: 3.6 mmol/L (ref 3.5–5.1)
Sodium: 126 mmol/L — ABNORMAL LOW (ref 135–145)

## 2020-02-11 LAB — CBC
HCT: 40 % (ref 36.0–46.0)
Hemoglobin: 13 g/dL (ref 12.0–15.0)
MCH: 31.6 pg (ref 26.0–34.0)
MCHC: 32.5 g/dL (ref 30.0–36.0)
MCV: 97.3 fL (ref 80.0–100.0)
Platelets: 388 10*3/uL (ref 150–400)
RBC: 4.11 MIL/uL (ref 3.87–5.11)
RDW: 13.5 % (ref 11.5–15.5)
WBC: 17.2 10*3/uL — ABNORMAL HIGH (ref 4.0–10.5)
nRBC: 0 % (ref 0.0–0.2)

## 2020-02-11 LAB — SARS-COV-2 RNA,(COVID-19) QUALITATIVE NAAT: SARS CoV2 RNA: NOT DETECTED

## 2020-02-11 NOTE — ED Triage Notes (Signed)
Pt here for eval of productive cough and shob x 2 days. Tested for covid yesterday, results pending. Also endorses upper abdominal pain, but not n/v/d.

## 2020-02-11 NOTE — ED Provider Notes (Signed)
[facility] DEPART[facility] Provider Note   CSN: [id number] Arrival date & time: 02/11/20  1652     History Chief Complaint  Patient presents with  . Shortness of Breath    Erin Wong is a 69 y.o. female with a history of hypertension, hyperlipidemia, CKD, asthma, afib on eliquis, and GERD who presents to the ED with complaints of cough and dyspnea which have been progressively worsening over the past 6 days. Patient states she has had nasal congestion, cough, dyspnea, & lower chest/upper abdominal discomfort. Patient relays that cough is intermittently productive of yellow phlegm sputum, but she is having some trouble getting the sputum up, her dyspnea is constant worse with activity and with laying supine, and that the pressure in her lower chest/upper abdomen is constant, worse with coughing & deep breathing, no alleviating factors. Also has had some symmetric swelling in her feet. She saw PCP office yesterday, had COVID swab which was negative, and was given cough medicines. She has been taking the cough medicine & using nasal spray without much relief. Of note she recently hospitalized for afib, attempted cardioversion without success, discharged in afib on 01/19/20- followed by afib clinic. She has had both of her COVID 19 vaccine doses. Denies  hemoptysis, recent surgery/trauma, recent long travel, hormone use, personal hx of cancer, or hx of DVT/PE. Her husband @ bedside provides additional history.   HPI     Past Medical History:  Diagnosis Date  . Allergy   . Arthritis   . Asthma   . Benign essential HTN   . CKD (chronic kidney disease) stage 3, GFR 30-59 ml/min   . HLD (hyperlipidemia)     Patient Active Problem List   Diagnosis Date Noted  . Acute systolic heart failure ([place]) 01/19/2020  . Atrial fibrillation with rapid ventricular response ([place]) 01/13/2020  . CKD (chronic kidney disease) stage 3, GFR 30-59 ml/min   . Benign essential HTN   .  HLD (hyperlipidemia)   . Hyperlipidemia 02/12/2017  . AKI (acute kidney injury) ([place])   . Chest pain 01/13/2017  . Chest pressure   . Acute kidney injury superimposed on CKD ([place])   . Osteoporosis 10/18/2015  . Vitamin D deficiency 07/20/2015  . HTN (hypertension) 07/17/2015  . GERD (gastroesophageal reflux disease) 01/12/2015  . Chronic constipation 01/12/2015  . History of colon surgery 01/12/2015  . History of total hysterectomy with bilateral salpingo-oophorectomy (BSO) 01/12/2015  . Allergy   . Asthma     Past Surgical History:  Procedure Laterality Date  . ABDOMINAL HYSTERECTOMY  10/04/1995   Hysterectomy and Bilateral oophorectomy  . CARDIOVERSION N/A 01/17/2020   Procedure: CARDIOVERSION;  Surgeon: [name], MD;  Location: [facility] ENDOSCOPY;  Service: Cardiovascular;  Laterality: N/A;  . COLON SURGERY  09/2009   hole in colon  . FRACTURE SURGERY Right 08/02/2011   Right Leg--Plates & Screws  . 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 TEE WITHOUT CARDIOVERSION N/A 01/17/2020   Procedure: TRANSESOPHAGEAL ECHOCARDIOGRAM (TEE);  Surgeon: [name], MD;  Location: [facility] ENDOSCOPY;  Service: Cardiovascular;  Laterality: N/A;     OB History   No obstetric history on file.     Family History  Problem Relation Age of Onset  . Arthritis Mother   . Cancer Mother        Lung Cancer--Had quit smoking for 25 years  .  Miscarriages / [place] Mother   . Alcohol abuse Father   . Alcohol abuse Brother   . COPD Brother   . Asthma Maternal Aunt   . Diabetes Maternal Aunt     Social History   Tobacco Use  . Smoking status: Former Smoker    Quit date: 01/10/1991    Years since quitting: 29.1  . Smokeless tobacco: Never Used  Vaping Use  . Vaping Use: Never used  Substance Use Topics  . Alcohol use: No  . Drug use: No    Home Medications Prior to Admission medications     Medication Sig Start Date End Date Taking? Authorizing Provider  acetaminophen (TYLENOL) 650 MG CR tablet Take 2 tablets (1,300 mg total) by mouth 2 (two) times daily as needed for pain. 02/12/17   Orlena Sheldon, PA-C  albuterol (PROVENTIL HFA;VENTOLIN HFA) 108 (90 Base) MCG/ACT inhaler Inhale 1 puff into the lungs every 6 (six) hours as needed for wheezing or shortness of breath. 01/05/18   Orlena Sheldon, PA-C  amiodarone (PACERONE) 200 MG tablet Take 1 tablet (200mg ) twice daily 01/26/20   Burtis Junes, NP  apixaban (ELIQUIS) 5 MG TABS tablet Take 1 tablet (5 mg total) by mouth 2 (two) times daily. 01/19/20   Cheryln Manly, NP  atorvastatin (LIPITOR) 40 MG tablet TAKE 1 TABLET BY MOUTH EVERY DAY 10/04/19   [name], MD  benzonatate (TESSALON) 100 MG capsule Take 1 capsule (100 mg total) by mouth 2 (two) times daily as needed for cough. 02/10/20   Annie Main, FNP  calcium gluconate 500 MG tablet Take 1 tablet by mouth daily.    [provider]  carvedilol (COREG) 12.5 MG tablet Take 1 tablet (12.5 mg total) by mouth 2 (two) times daily with a meal. 01/26/20   Burtis Junes, NP  cholecalciferol (VITAMIN D) 1000 units tablet Take 1 tablet (1,000 Units total) by mouth daily. 08/05/16   Dena Billet B, PA-C  fluticasone (FLONASE) 50 MCG/ACT nasal spray Place 2 sprays into both nostrils daily. 02/10/20   Ishmael Holter A, FNP  Fluticasone-Salmeterol (ADVAIR DISKUS) 100-50 MCG/DOSE AEPB INHALE 1 PUFF INTO THE LUNGS TWICE A DAY 12/17/19   [name], MD  furosemide (LASIX) 20 MG tablet Take 1 tablet (20 mg total) by mouth daily. 01/26/20   Burtis Junes, NP  omeprazole (PRILOSEC) 20 MG capsule TAKE 1 CAPSULE BY MOUTH EVERY DAY 01/27/20   [name], MD  polyethylene glycol powder (GLYCOLAX/MIRALAX) powder Take 255 g by mouth 1 day or 1 dose. 08/05/16   Dixon, Lonie Peak, PA-C  sacubitril-valsartan (ENTRESTO) 24-26 MG Take 1 tablet by mouth 2 (two) times daily. 01/19/20    Cheryln Manly, NP  traMADol (ULTRAM) 50 MG tablet TAKE 2 TABLETS BY MOUTH 3 TIMES A DAY AS NEEDED FOR PAIN 11/22/19   [name], MD    Allergies    Patient has no known allergies.  Review of Systems   Review of Systems  Constitutional: Negative for chills and fever.  HENT: Positive for congestion. Negative for ear pain and sore throat.   Respiratory: Positive for cough and shortness of breath.   Cardiovascular: Positive for chest pain and leg swelling (feet).  Gastrointestinal: Positive for abdominal pain. Negative for diarrhea, nausea and vomiting.  Genitourinary: Negative for dysuria.  Neurological: Negative for syncope.  All other systems reviewed and are negative.   Physical Exam Updated Vital Signs BP 125/86  Pulse (!) 112   Temp 98.2 F (36.8 C)   Resp 15   SpO2 98%   Physical Exam Vitals and nursing note reviewed.  Constitutional:      General: She is not in acute distress.    Appearance: She is well-developed. She is not toxic-appearing.  HENT:     Head: Normocephalic and atraumatic.     Mouth/Throat:     Mouth: Mucous membranes are moist.     Comments: Small vesicles noted in the posterior oropharynx. No exudate or tonsillar edema. Eyes:     General:        Right eye: No discharge.        Left eye: No discharge.     Conjunctiva/sclera: Conjunctivae normal.     Pupils: Pupils are equal, round, and reactive to light.  Cardiovascular:     Comments: Tachycardic, irregularly irregular. Pulmonary:     Effort: No respiratory distress.     Breath sounds: Decreased breath sounds (Mildly at the bases.) present. No wheezing, rhonchi or rales.     Comments: SPO2 92% on room air. Chest:     Chest wall: Tenderness (Lower anterior chest wall.) present.  Abdominal:     General: There is no distension.     Palpations: Abdomen is soft.     Tenderness: There is abdominal tenderness (Upper abdomen, more so in the epigastrium.). There is no guarding or rebound.   Musculoskeletal:     Cervical back: Neck supple.     Comments: 1+ symmetric pitting edema to the bilateral lower legs.  Skin:    General: Skin is warm and dry.     Findings: No rash.  Neurological:     Mental Status: She is alert.     Comments: Clear speech.   Psychiatric:        Behavior: Behavior normal.    ED Results / Procedures / Treatments   Labs (all labs ordered are listed, but only abnormal results are displayed) Labs Reviewed  CBC - Abnormal; Notable for the following components:      Result Value   WBC 17.2 (*)    All other components within normal limits  BASIC METABOLIC PANEL - Abnormal; Notable for the following components:   Sodium 126 (*)    Chloride 92 (*)    Glucose, Bld 128 (*)    Calcium 8.5 (*)    All other components within normal limits  LIPASE, BLOOD    EKG EKG Interpretation  Date/Time:  Friday February 11 2020 16:52:30 EDT Ventricular Rate:  110 PR Interval:    QRS Duration: 100 QT Interval:  440 QTC Calculation: 595 R Axis:   60 Text Interpretation: Atrial fibrillation with rapid ventricular response ST & T wave abnormality, consider lateral ischemia Abnormal ECG Baseline wander Confirmed by Pryor Curia [phone]) on 02/12/2020 12:10:49 AM   Radiology DG Chest 2 View  Result Date: 02/11/2020 CLINICAL DATA:  Shortness of breath and productive cough for 2 days. EXAM: CHEST - 2 VIEW COMPARISON:  01/13/2020, 01/13/2017 FINDINGS: Mild cardiomegaly, unchanged from prior allowing for differences in technique. No pulmonary edema. Chronic left basilar pleuroparenchymal scarring. Mild biapical pleuroparenchymal scarring. No acute airspace disease. No pneumothorax. No significant pleural fluid or confluent airspace disease. Osseous structures are intact. IMPRESSION: 1. No acute abnormality. 2. Stable mild cardiomegaly and left basilar pleuroparenchymal scarring. Electronically Signed   By: Keith Rake M.D.   On: 02/11/2020 18:21   CT Angio Chest PE  W/Cm &/Or Wo Cm  Result Date: 02/12/2020 CLINICAL DATA:  Shortness of breath. EXAM: CT ANGIOGRAPHY CHEST WITH CONTRAST TECHNIQUE: Multidetector CT imaging of the chest was performed using the standard protocol during bolus administration of intravenous contrast. Multiplanar CT image reconstructions and MIPs were obtained to evaluate the vascular anatomy. CONTRAST:  153mL OMNIPAQUE IOHEXOL 350 MG/ML SOLN COMPARISON:  None. FINDINGS: Cardiovascular: Contrast injection is sufficient to demonstrate satisfactory opacification of the pulmonary arteries to the segmental level. There is no pulmonary embolus or evidence of right heart strain. The size of the main pulmonary artery is normal. Heart size is normal, with no pericardial effusion. There are coronary artery calcifications. There are atherosclerotic changes of the thoracic aorta without evidence for an aneurysm there is significant reflux of contrast into the IVC. Mediastinum/Nodes: -- No mediastinal lymphadenopathy. -- No hilar lymphadenopathy. -- No axillary lymphadenopathy. -- No supraclavicular lymphadenopathy. -- Normal thyroid gland where visualized. -  Unremarkable esophagus. Lungs/Pleura: There are emphysematous changes. There is consolidation in the right upper lobe and left lower lobe with areas of bronchial wall thickening and mucus plugging. There is no pneumothorax. There is no significant pleural effusion. Upper Abdomen: Contrast bolus timing is not optimized for evaluation of the abdominal organs. The visualized portions of the organs of the upper abdomen are normal. Musculoskeletal: The patient is status post prior splenectomy. There are few small splenules in left upper quadrant. There is an apparent large ventral wall hernia containing fat. However, this hernia is only partially visualized. Review of the MIP images confirms the above findings. IMPRESSION: 1. No acute pulmonary embolism. 2. Findings concerning for infectious bronchiolitis (viral  or bacterial) with areas of consolidation involving the right upper and left lower lobes. 3. Significant reflux of contrast into the IVC, suggestive of right heart dysfunction. Aortic Atherosclerosis (ICD10-I70.0) and Emphysema (ICD10-J43.9). Electronically Signed   By: Constance Holster M.D.   On: 02/12/2020 01:43    Procedures .Critical Care Performed by: Amaryllis Dyke, PA-C Authorized by: Amaryllis Dyke, PA-C    CRITICAL CARE Performed by: Kennith Maes   Total critical care time: 30 minutes  Critical care time was exclusive of separately billable procedures and treating other patients.  Critical care was necessary to treat or prevent imminent or life-threatening deterioration.  Critical care was time spent personally by me on the following activities: development of treatment plan with patient and/or surrogate as well as nursing, discussions with consultants, evaluation of patient's response to treatment, examination of patient, obtaining history from patient or surrogate, ordering and performing treatments and interventions, ordering and review of laboratory studies, ordering and review of radiographic studies, pulse oximetry and re-evaluation of patient's condition.    (including critical care time)  Medications Ordered in ED Medications  diltiazem (CARDIZEM) 125 mg in dextrose 5% 125 mL (1 mg/mL) infusion (5 mg/hr Intravenous New Bag/Given 02/12/20 0139)  furosemide (LASIX) injection 20 mg (has no administration in time range)  potassium chloride SA (KLOR-CON) CR tablet 20 mEq (has no administration in time range)  cefTRIAXone (ROCEPHIN) 1 g in sodium chloride 0.9 % 100 mL IVPB (has no administration in time range)  azithromycin (ZITHROMAX) 500 mg in sodium chloride 0.9 % 250 mL IVPB (has no administration in time range)  fentaNYL (SUBLIMAZE) injection 50 mcg (has no administration in time range)  benzonatate (TESSALON) capsule 100 mg (has no  administration in time range)  albuterol (VENTOLIN HFA) 108 (90 Base) MCG/ACT inhaler 2 puff (2 puffs Inhalation Given 02/12/20 0059)  AeroChamber Plus Flo-Vu Large MISC  1 each (1 each Other Given 02/12/20 0059)  iohexol (OMNIPAQUE) 350 MG/ML injection 100 mL (100 mLs Intravenous Contrast Given 02/12/20 0129)    ED Course  I have reviewed the triage vital signs and the nursing notes.  Pertinent labs & imaging results that were available during my care of the patient were reviewed by me and considered in my medical decision making (see chart for details).  SINDY MCCUNE was evaluated in Emergency Department on 02/12/2020 for the symptoms described in the history of present illness. He/she was evaluated in the context of the global COVID-19 pandemic, which necessitated consideration that the patient might be at risk for infection with the SARS-CoV-2 virus that causes COVID-19. Institutional protocols and algorithms that pertain to the evaluation of patients at risk for COVID-19 are in a state of rapid change based on information released by regulatory bodies including the CDC and federal and state organizations. These policies and algorithms were followed during the patient's care in the ED.    MDM Rules/Calculators/A&P                         Patient presents to the ED with complaints of cough & dyspnea. She is in afib, tachycardic, SpO2 borderline @ 92% at rest, does not appear in respiratory distress. Afebrile. Decreased bibasilar breath sounds. Reproducibility of her chest/abdominal discomfort with palpation. LE symmetric edema noted.   Ddx: Pneumonia, bronchitis, viral illness, pulmonary embolism, pneumothorax, CHF, asthma exacerbation, critical anemia, intra-abdominal process.  Additional history obtained:  Additional history obtained from chart review & patient's husband. Previous records obtained and reviewed.   Lab Tests:  Reviewed and interpreted labs, which included:  CBC: Leukocytosis  at 17.2 does seem to have elevated white blood cell count at baseline but this is increased. No significant anemia.  BMP: Hyponatremia and hypochloremia as well as hypocalcemia. Renal function preserved. Lipase: Within normal limits Hepatic function panel: Mild hypoalbuminemia. LFTs WNL.  BNP: Elevated Troponin: 12 COVID: Negative  EKG: Afib Imaging Studies ordered:  I ordered imaging studies which included CXR, I independently visualized and interpreted imaging which showed 1. No acute abnormality. 2. Stable mild cardiomegaly and left basilar pleuroparenchymal scarring  I have ordered CTA of the chest: 1. No acute pulmonary embolism. 2. Findings concerning for infectious bronchiolitis (viral or bacterial) with areas of consolidation involving the right upper and left lower lobes. 3. Significant reflux of contrast into the IVC, suggestive of right heart dysfunction. Aortic Atherosclerosis and Emphysema   ED Course:  0015: Patient with dyspnea, cough, and chest/abdominal pressure. She is in A. fib with RVR, will start diltiazem drip. We will trial albuterol inhaler for symptomatic relief.   Troponin is not elevated, low suspicion for ACS at this time. CT angio negative for pulmonary embolism.  CTA does show findings of viral versus bacterial infectious bronchiolitis as well as right heart dysfunction.  Her SPO2 occasionally drops into the upper 80s, improved with application of O2 via [place].  Suspect multifactorial presentation with afib with rvr, bronchiolitis, and CHF. We will proceed with antibiotics and Lasix with consult hospitalist service for admission. Patient & her husband updated on results & plan of care- in agreement.   Findings and plan of care discussed with supervising physician Dr. Leonides Schanz who has evaluated the patient & is in agreement.   02:50: CONSULT: Discussed with hospitalist Dr. Hal Hope- accepts admission.   Portions of this note were generated with [employer].  Dictation errors  may occur despite best attempts at proofreading.  Final Clinical Impression(s) / ED Diagnoses Final diagnoses:  Acute hypoxemic respiratory failure ([place])  Atrial fibrillation with rapid ventricular response [facility])  Bronchiolitis    Rx / DC Orders ED Discharge Orders    None       Amaryllis Dyke, PA-C 02/12/20 0308    Ward, Delice Bison, DO 02/12/20 [phone]

## 2020-02-12 ENCOUNTER — Emergency Department (HOSPITAL_COMMUNITY): Payer: Medicare Other

## 2020-02-12 ENCOUNTER — Encounter (HOSPITAL_COMMUNITY): Payer: Self-pay | Admitting: Internal Medicine

## 2020-02-12 DIAGNOSIS — J90 Pleural effusion, not elsewhere classified: Secondary | ICD-10-CM | POA: Diagnosis not present

## 2020-02-12 DIAGNOSIS — I5043 Acute on chronic combined systolic (congestive) and diastolic (congestive) heart failure: Secondary | ICD-10-CM | POA: Diagnosis present

## 2020-02-12 DIAGNOSIS — E871 Hypo-osmolality and hyponatremia: Secondary | ICD-10-CM | POA: Diagnosis present

## 2020-02-12 DIAGNOSIS — Z90710 Acquired absence of both cervix and uterus: Secondary | ICD-10-CM | POA: Diagnosis not present

## 2020-02-12 DIAGNOSIS — E785 Hyperlipidemia, unspecified: Secondary | ICD-10-CM | POA: Diagnosis present

## 2020-02-12 DIAGNOSIS — Z79899 Other long term (current) drug therapy: Secondary | ICD-10-CM | POA: Diagnosis not present

## 2020-02-12 DIAGNOSIS — Z20822 Contact with and (suspected) exposure to covid-19: Secondary | ICD-10-CM | POA: Diagnosis present

## 2020-02-12 DIAGNOSIS — I4891 Unspecified atrial fibrillation: Secondary | ICD-10-CM | POA: Diagnosis not present

## 2020-02-12 DIAGNOSIS — I429 Cardiomyopathy, unspecified: Secondary | ICD-10-CM | POA: Diagnosis present

## 2020-02-12 DIAGNOSIS — J45909 Unspecified asthma, uncomplicated: Secondary | ICD-10-CM | POA: Diagnosis present

## 2020-02-12 DIAGNOSIS — I10 Essential (primary) hypertension: Secondary | ICD-10-CM | POA: Diagnosis not present

## 2020-02-12 DIAGNOSIS — J9601 Acute respiratory failure with hypoxia: Secondary | ICD-10-CM | POA: Diagnosis present

## 2020-02-12 DIAGNOSIS — Z9189 Other specified personal risk factors, not elsewhere classified: Secondary | ICD-10-CM | POA: Diagnosis not present

## 2020-02-12 DIAGNOSIS — J209 Acute bronchitis, unspecified: Secondary | ICD-10-CM | POA: Diagnosis present

## 2020-02-12 DIAGNOSIS — Z7901 Long term (current) use of anticoagulants: Secondary | ICD-10-CM | POA: Diagnosis not present

## 2020-02-12 DIAGNOSIS — Z87891 Personal history of nicotine dependence: Secondary | ICD-10-CM | POA: Diagnosis not present

## 2020-02-12 DIAGNOSIS — N183 Chronic kidney disease, stage 3 unspecified: Secondary | ICD-10-CM | POA: Diagnosis present

## 2020-02-12 DIAGNOSIS — J9811 Atelectasis: Secondary | ICD-10-CM | POA: Diagnosis not present

## 2020-02-12 DIAGNOSIS — E878 Other disorders of electrolyte and fluid balance, not elsewhere classified: Secondary | ICD-10-CM | POA: Diagnosis present

## 2020-02-12 DIAGNOSIS — Z9081 Acquired absence of spleen: Secondary | ICD-10-CM | POA: Diagnosis not present

## 2020-02-12 DIAGNOSIS — J189 Pneumonia, unspecified organism: Secondary | ICD-10-CM | POA: Diagnosis present

## 2020-02-12 DIAGNOSIS — R0602 Shortness of breath: Secondary | ICD-10-CM | POA: Diagnosis not present

## 2020-02-12 DIAGNOSIS — I4819 Other persistent atrial fibrillation: Secondary | ICD-10-CM | POA: Diagnosis present

## 2020-02-12 DIAGNOSIS — I5021 Acute systolic (congestive) heart failure: Secondary | ICD-10-CM

## 2020-02-12 DIAGNOSIS — I517 Cardiomegaly: Secondary | ICD-10-CM | POA: Diagnosis not present

## 2020-02-12 DIAGNOSIS — I130 Hypertensive heart and chronic kidney disease with heart failure and stage 1 through stage 4 chronic kidney disease, or unspecified chronic kidney disease: Secondary | ICD-10-CM | POA: Diagnosis present

## 2020-02-12 LAB — HEPATIC FUNCTION PANEL
ALT: 33 U/L (ref 0–44)
AST: 31 U/L (ref 15–41)
Albumin: 3.1 g/dL — ABNORMAL LOW (ref 3.5–5.0)
Alkaline Phosphatase: 59 U/L (ref 38–126)
Bilirubin, Direct: 0.2 mg/dL (ref 0.0–0.2)
Indirect Bilirubin: 0.4 mg/dL (ref 0.3–0.9)
Total Bilirubin: 0.6 mg/dL (ref 0.3–1.2)
Total Protein: 5.7 g/dL — ABNORMAL LOW (ref 6.5–8.1)

## 2020-02-12 LAB — C DIFFICILE QUICK SCREEN W PCR REFLEX
C Diff antigen: NEGATIVE
C Diff interpretation: NOT DETECTED
C Diff toxin: NEGATIVE

## 2020-02-12 LAB — BRAIN NATRIURETIC PEPTIDE: B Natriuretic Peptide: 602 pg/mL — ABNORMAL HIGH (ref 0.0–100.0)

## 2020-02-12 LAB — TROPONIN I (HIGH SENSITIVITY)
Troponin I (High Sensitivity): 12 ng/L (ref <18)
Troponin I (High Sensitivity): 17 ng/L (ref <18)

## 2020-02-12 LAB — EXPECTORATED SPUTUM ASSESSMENT W GRAM STAIN, RFLX TO RESP C

## 2020-02-12 LAB — SARS CORONAVIRUS 2 BY RT PCR (HOSPITAL ORDER, PERFORMED IN ~~LOC~~ HOSPITAL LAB): SARS Coronavirus 2: NEGATIVE

## 2020-02-12 LAB — MAGNESIUM: Magnesium: 1.3 mg/dL — ABNORMAL LOW (ref 1.7–2.4)

## 2020-02-12 MED ORDER — SODIUM CHLORIDE 0.9 % IV SOLN
500.0000 mg | INTRAVENOUS | Status: DC
  Administered 2020-02-13: 500 mg via INTRAVENOUS
  Filled 2020-02-12: qty 500

## 2020-02-12 MED ORDER — SODIUM CHLORIDE 0.9 % IV SOLN
2.0000 g | INTRAVENOUS | Status: DC
  Administered 2020-02-12 – 2020-02-15 (×4): 2 g via INTRAVENOUS
  Filled 2020-02-12 (×2): qty 20
  Filled 2020-02-12 (×3): qty 2

## 2020-02-12 MED ORDER — TRAMADOL HCL 50 MG PO TABS: 100.0000 mg | ORAL_TABLET | Freq: Three times a day (TID) | ORAL | Status: DC | PRN

## 2020-02-12 MED ORDER — BENZONATATE 100 MG PO CAPS
100.0000 mg | ORAL_CAPSULE | Freq: Once | ORAL | Status: AC
  Administered 2020-02-12: 100 mg via ORAL
  Filled 2020-02-12: qty 1

## 2020-02-12 MED ORDER — APIXABAN 5 MG PO TABS
5.0000 mg | ORAL_TABLET | Freq: Two times a day (BID) | ORAL | Status: DC
  Administered 2020-02-12 – 2020-02-16 (×9): 5 mg via ORAL
  Filled 2020-02-12 (×9): qty 1

## 2020-02-12 MED ORDER — ONDANSETRON HCL 4 MG PO TABS: 4.0000 mg | ORAL_TABLET | Freq: Four times a day (QID) | ORAL | Status: DC | PRN

## 2020-02-12 MED ORDER — FUROSEMIDE 10 MG/ML IJ SOLN
20.0000 mg | Freq: Every day | INTRAMUSCULAR | Status: DC
  Administered 2020-02-12 – 2020-02-16 (×5): 20 mg via INTRAVENOUS
  Filled 2020-02-12 (×5): qty 2

## 2020-02-12 MED ORDER — SODIUM CHLORIDE 0.9 % IV SOLN
1.0000 g | Freq: Once | INTRAVENOUS | Status: AC
  Administered 2020-02-12: 1 g via INTRAVENOUS
  Filled 2020-02-12: qty 10

## 2020-02-12 MED ORDER — DM-GUAIFENESIN ER 30-600 MG PO TB12
1.0000 | ORAL_TABLET | Freq: Two times a day (BID) | ORAL | Status: DC
  Administered 2020-02-12 – 2020-02-16 (×8): 1 via ORAL
  Filled 2020-02-12 (×8): qty 1

## 2020-02-12 MED ORDER — CALCIUM GLUCONATE 500 MG PO TABS
1.0000 | ORAL_TABLET | Freq: Every day | ORAL | Status: DC
  Filled 2020-02-12: qty 1

## 2020-02-12 MED ORDER — SACUBITRIL-VALSARTAN 24-26 MG PO TABS
1.0000 | ORAL_TABLET | Freq: Two times a day (BID) | ORAL | Status: DC
  Administered 2020-02-12 – 2020-02-16 (×9): 1 via ORAL
  Filled 2020-02-12 (×9): qty 1

## 2020-02-12 MED ORDER — AEROCHAMBER PLUS FLO-VU LARGE MISC
1.0000 | Freq: Once | Status: AC
  Administered 2020-02-12: 1

## 2020-02-12 MED ORDER — MAGNESIUM SULFATE 4 GM/100ML IV SOLN
4.0000 g | Freq: Once | INTRAVENOUS | Status: AC
  Administered 2020-02-12: 4 g via INTRAVENOUS
  Filled 2020-02-12: qty 100

## 2020-02-12 MED ORDER — FENTANYL CITRATE (PF) 100 MCG/2ML IJ SOLN
50.0000 ug | Freq: Once | INTRAMUSCULAR | Status: AC
  Administered 2020-02-12: 50 ug via INTRAVENOUS
  Filled 2020-02-12: qty 2

## 2020-02-12 MED ORDER — POTASSIUM CHLORIDE CRYS ER 20 MEQ PO TBCR
20.0000 meq | EXTENDED_RELEASE_TABLET | Freq: Once | ORAL | Status: AC
  Administered 2020-02-12: 20 meq via ORAL
  Filled 2020-02-12: qty 1

## 2020-02-12 MED ORDER — MOMETASONE FURO-FORMOTEROL FUM 100-5 MCG/ACT IN AERO
2.0000 | INHALATION_SPRAY | Freq: Two times a day (BID) | RESPIRATORY_TRACT | Status: DC
  Administered 2020-02-12 – 2020-02-16 (×9): 2 via RESPIRATORY_TRACT
  Filled 2020-02-12: qty 8.8

## 2020-02-12 MED ORDER — METHYLPREDNISOLONE SODIUM SUCC 40 MG IJ SOLR
40.0000 mg | Freq: Every day | INTRAMUSCULAR | Status: DC
  Administered 2020-02-12 – 2020-02-16 (×5): 40 mg via INTRAVENOUS
  Filled 2020-02-12 (×5): qty 1

## 2020-02-12 MED ORDER — ATORVASTATIN CALCIUM 40 MG PO TABS
40.0000 mg | ORAL_TABLET | Freq: Every day | ORAL | Status: DC
  Administered 2020-02-12 – 2020-02-16 (×5): 40 mg via ORAL
  Filled 2020-02-12 (×5): qty 1

## 2020-02-12 MED ORDER — IOHEXOL 350 MG/ML SOLN
100.0000 mL | Freq: Once | INTRAVENOUS | Status: AC | PRN
  Administered 2020-02-12: 100 mL via INTRAVENOUS

## 2020-02-12 MED ORDER — SODIUM CHLORIDE 0.9 % IV SOLN
500.0000 mg | Freq: Once | INTRAVENOUS | Status: AC
  Administered 2020-02-12: 500 mg via INTRAVENOUS
  Filled 2020-02-12: qty 500

## 2020-02-12 MED ORDER — CARVEDILOL 25 MG PO TABS
25.0000 mg | ORAL_TABLET | Freq: Two times a day (BID) | ORAL | Status: DC
  Administered 2020-02-12 – 2020-02-16 (×8): 25 mg via ORAL
  Filled 2020-02-12 (×8): qty 1

## 2020-02-12 MED ORDER — BENZONATATE 100 MG PO CAPS
200.0000 mg | ORAL_CAPSULE | Freq: Three times a day (TID) | ORAL | Status: AC | PRN
  Administered 2020-02-12 – 2020-02-16 (×3): 200 mg via ORAL
  Filled 2020-02-12 (×3): qty 2

## 2020-02-12 MED ORDER — DILTIAZEM HCL-DEXTROSE 125-5 MG/125ML-% IV SOLN (PREMIX)
5.0000 mg/h | INTRAVENOUS | Status: DC
  Administered 2020-02-12: 5 mg/h via INTRAVENOUS
  Filled 2020-02-12: qty 125

## 2020-02-12 MED ORDER — ALBUTEROL SULFATE HFA 108 (90 BASE) MCG/ACT IN AERS
2.0000 | INHALATION_SPRAY | Freq: Once | RESPIRATORY_TRACT | Status: AC
  Administered 2020-02-12: 2 via RESPIRATORY_TRACT
  Filled 2020-02-12: qty 6.7

## 2020-02-12 MED ORDER — AMIODARONE HCL 200 MG PO TABS
200.0000 mg | ORAL_TABLET | Freq: Every day | ORAL | Status: DC
  Administered 2020-02-12 – 2020-02-13 (×2): 200 mg via ORAL
  Filled 2020-02-12 (×2): qty 1

## 2020-02-12 MED ORDER — POLYETHYLENE GLYCOL 3350 17 G PO PACK
17.0000 g | PACK | Freq: Every day | ORAL | Status: DC
  Administered 2020-02-13 – 2020-02-14 (×2): 17 g via ORAL
  Filled 2020-02-12 (×4): qty 1

## 2020-02-12 MED ORDER — PANTOPRAZOLE SODIUM 40 MG PO TBEC
40.0000 mg | DELAYED_RELEASE_TABLET | Freq: Every day | ORAL | Status: DC
  Administered 2020-02-12 – 2020-02-16 (×5): 40 mg via ORAL
  Filled 2020-02-12 (×5): qty 1

## 2020-02-12 MED ORDER — FUROSEMIDE 10 MG/ML IJ SOLN
20.0000 mg | Freq: Once | INTRAMUSCULAR | Status: AC
  Administered 2020-02-12: 20 mg via INTRAVENOUS
  Filled 2020-02-12: qty 2

## 2020-02-12 MED ORDER — CARVEDILOL 12.5 MG PO TABS
12.5000 mg | ORAL_TABLET | Freq: Two times a day (BID) | ORAL | Status: DC
  Administered 2020-02-12: 12.5 mg via ORAL
  Filled 2020-02-12: qty 1

## 2020-02-12 MED ORDER — CALCIUM CARBONATE ANTACID 500 MG PO CHEW
0.5000 | CHEWABLE_TABLET | Freq: Every day | ORAL | Status: DC
  Administered 2020-02-12 – 2020-02-16 (×5): 100 mg via ORAL
  Filled 2020-02-12 (×5): qty 1

## 2020-02-12 MED ORDER — FLUTICASONE PROPIONATE 50 MCG/ACT NA SUSP
2.0000 | Freq: Every day | NASAL | Status: DC
  Administered 2020-02-12 – 2020-02-16 (×4): 2 via NASAL
  Filled 2020-02-12: qty 16

## 2020-02-12 MED ORDER — ONDANSETRON HCL 4 MG/2ML IJ SOLN: 4.0000 mg | Freq: Four times a day (QID) | INTRAMUSCULAR | Status: DC | PRN

## 2020-02-12 MED ORDER — LEVALBUTEROL HCL 0.63 MG/3ML IN NEBU
0.6300 mg | INHALATION_SOLUTION | Freq: Four times a day (QID) | RESPIRATORY_TRACT | Status: DC
  Administered 2020-02-12 – 2020-02-15 (×12): 0.63 mg via RESPIRATORY_TRACT
  Filled 2020-02-12 (×15): qty 3

## 2020-02-12 NOTE — ED Notes (Signed)
Constant coughing pt returned from c-t  cardizem hung

## 2020-02-12 NOTE — Progress Notes (Signed)
PROGRESS NOTE  INOCENCIA MURTAUGH ZOX:[id number] DOB: 1951-01-28 DOA: 02/11/2020 PCP: [name], MD  HPI/Recap of past 24 hours: HPI from Dr Guy Sandifer is a 69 y.o. female with history of A. fib recently diagnosed cardiomyopathy admitted last month for A. fib with RVR with CHF and was placed on amiodarone and Eliquis at that time. Pt has been having productive cough of yellowish sputum and shortness of breath for the last 1 week had gone to her PCP about 2 days ago and was advised symptomatic treatment.  Patient states that over the last 2 days patient has become more acutely SOB, with some lower extremity edema. In the ED, patient was noted to be in A. fib with RVR was started on Cardizem drip.  CT angiogram of the chest done shows features concerning for bronchiolitis viral versus bacterial with some consolidation in the right upper and lower lobes. Covid test is negative.  Labs show sodium 126 LFTs unremarkable WBC count is elevated at 17.2. Pt admitted for further management.   Today, pt still SOB, with productive cough, denies any chest pain, N/V, abdominal pain, fever/chills, noted about 3 episodes of loose stools this morning.    Assessment/Plan: Principal Problem:   Acute respiratory failure with hypoxia (HCC) Active Problems:   Asthma   CKD (chronic kidney disease) stage 3, GFR 30-59 ml/min   Benign essential HTN   Atrial fibrillation with rapid ventricular response (HCC)   Acute systolic heart failure (HCC)   Acute bronchitis  Acute hypoxic respiratory failure likely 2/2 pneumonia with underlying asthma and CHF In the ED, drop sats to the 80s Sats currently above 90% on 2L of oxygen, plan to wean off Currently afebrile with leukocytosis (on steroids) CTA chest concerning for possible viral bronchiolitis versus bacterial with some consolidation in the right upper and lower lobes Continue azithromycin, ceftriaxone (will escalate to cover for HCAP if patient  deteriorates) Continue supplemental oxygen, duonebs, inhalers, steroids Monitor closely  Paroxysmal A. fib with RVR Failed DCCV started on amiodarone Status post diltiazem drip, discontinued due to low EF Cardiology on board, continue amiodarone, increase Coreg to 25 twice daily, continue Eliquis, plan for DCCV on 6/14 pending respiratory improvement Telemetry  Acute on chronic systolic HF/cardiomyopathy possibly tachy-mediated BNP elevated TEE done 01/2020 showed EF of 30 to 35% with global hypokinesis, moderate to severe MR and TR Continue Lasix, Coreg, Entresto Strict I's and O's, daily weights  Hyponatremia/hypomagnesemia Replace prn Daily BMP  Asthma Management as above  HLD Continue statins          Malnutrition Type:      Malnutrition Characteristics:      Nutrition Interventions:       Estimated body mass index is 26.09 kg/m as calculated from the following:   Height as of 01/26/20: 5\' 4"  (1.626 m).   Weight as of 02/10/20: 68.9 kg.     Code Status: Full  Family Communication: Husband at bedside  Disposition Plan: Status is: Inpatient  Remains inpatient appropriate because:Inpatient level of care appropriate due to severity of illness   Dispo: The patient is from: Home              Anticipated d/c is to: Home              Anticipated d/c date is: 3 days              Patient currently is not medically stable to d/c.    Consultants:  Cardiology  Procedures:  None  Antimicrobials:  Ceftriaxone  Azithromycin  DVT prophylaxis: Eliquis   Objective: Vitals:   02/12/20 0821 02/12/20 0839 02/12/20 1140 02/12/20 1321  BP:  112/71 103/63   Pulse:  88 84   Resp:  18 19   Temp:  98.1 F (36.7 C) 98.3 F (36.8 C)   TempSrc:  Oral Oral   SpO2: 92% 94% 94% 95%    Intake/Output Summary (Last 24 hours) at 02/12/2020 1335 Last data filed at 02/12/2020 8366 Gross per 24 hour  Intake 600 ml  Output --  Net 600 ml   There were  no vitals filed for this visit.  Exam:  General: NAD   Cardiovascular: S1, S2 present  Respiratory: Diminished BS bilaterally  Abdomen: Soft, nontender, nondistended, bowel sounds present  Musculoskeletal: Trace bilateral pedal edema noted  Skin: Normal  Psychiatry: Normal mood    Data Reviewed: CBC: Recent Labs  Lab 02/11/20 1703  WBC 17.2*  HGB 13.0  HCT 40.0  MCV 97.3  PLT 294   Basic Metabolic Panel: Recent Labs  Lab 02/11/20 1703 02/12/20 1007  NA 126*  --   K 3.6  --   CL 92*  --   CO2 23  --   GLUCOSE 128*  --   BUN 10  --   CREATININE 0.93  --   CALCIUM 8.5*  --   MG  --  1.3*   GFR: Estimated Creatinine Clearance: 54.4 mL/min (by C-G formula based on SCr of 0.93 mg/dL). Liver Function Tests: Recent Labs  Lab 02/12/20 0030  AST 31  ALT 33  ALKPHOS 59  BILITOT 0.6  PROT 5.7*  ALBUMIN 3.1*   Recent Labs  Lab 02/11/20 1703  LIPASE 20   No results for input(s): AMMONIA in the last 168 hours. Coagulation Profile: No results for input(s): INR, PROTIME in the last 168 hours. Cardiac Enzymes: No results for input(s): CKTOTAL, CKMB, CKMBINDEX, TROPONINI in the last 168 hours. BNP (last 3 results) No results for input(s): PROBNP in the last 8760 hours. HbA1C: No results for input(s): HGBA1C in the last 72 hours. CBG: No results for input(s): GLUCAP in the last 168 hours. Lipid Profile: No results for input(s): CHOL, HDL, LDLCALC, TRIG, CHOLHDL, LDLDIRECT in the last 72 hours. Thyroid Function Tests: No results for input(s): TSH, T4TOTAL, FREET4, T3FREE, THYROIDAB in the last 72 hours. Anemia Panel: No results for input(s): VITAMINB12, FOLATE, FERRITIN, TIBC, IRON, RETICCTPCT in the last 72 hours. Urine analysis:    Component Value Date/Time   COLORURINE STRAW (A) 01/13/2017 2028   APPEARANCEUR CLEAR 01/13/2017 2028   LABSPEC 1.012 01/13/2017 2028   PHURINE 5.0 01/13/2017 2028   GLUCOSEU NEGATIVE 01/13/2017 2028   HGBUR NEGATIVE  01/13/2017 2028   BILIRUBINUR NEGATIVE 01/13/2017 2028   KETONESUR 5 (A) 01/13/2017 2028   PROTEINUR NEGATIVE 01/13/2017 2028   NITRITE NEGATIVE 01/13/2017 2028   LEUKOCYTESUR NEGATIVE 01/13/2017 2028   Sepsis Labs: @LABRCNTIP (procalcitonin:4,lacticidven:4)  ) Recent Results (from the past 240 hour(s))  SARS-COV-2 RNA,(COVID-19) QUAL NAAT     Status: None   Collection Time: 02/10/20  9:34 AM   Specimen: Respiratory  Result Value Ref Range Status   SARS CoV2 RNA NOT DETECTED NOT DETECT Final    Comment: . A Not Detected (negative) test result for this test means that SARS-CoV-2 RNA was not present in the specimen above the limit of detection. A negative result does not rule out the possibility of COVID-19 and should  not be used as the sole basis for treatment or patient management  decisions.  If COVID-19 is still suspected, based on  exposure history together with other clinical findings, re-testing should be considered in consultation with public health authorities. Laboratory test results should always be considered in the context of clinical  observations and epidemiological data in making a final diagnosis and patient management decisions. . This patient specimen was tested using an FDA EUA pooling method. . Patient specimens with low viral loads may not be detected in sample pools due to the decreased sensitivity of  pooled testing. . Please review the "Fact Sheets" and FDA authorized labeling available for health care providers and patients using the foll owing websites: https://www.[email or web address]/[email or web address]/fact-sheet2  [email or web address]://[email or web address]/home/Covid-19/Patients/NAAT/ fact-sheet2 . This test has been authorized by the FDA under an  Emergency Use Authorization (EUA) for use by authorized laboratories. . Due to the current public health emergency, [facility] is receiving a high volume of samples from a wide variety of swabs  and media for COVID-19 testing. In order to serve patients during this public health crisis, samples from appropriate clinical sources are  being tested. Negative test results derived from specimens received in non-commercially manufactured viral collection and transport media, or in media and sample collection kits not yet authorized by FDA for COVID-19 testing should be cautiously evaluated and the patient potentially subjected to extra precautions such as additional clinical monitoring, including collection of an additional specimen. . Methodology:  Nucle ic Acid Amplification Test (NAAT) includes RT-PCR or TMA . Additional information about COVID-19 can be found at the [employer] website: www.[email or web address].   SARS Coronavirus 2 by RT PCR (hospital order, performed in [facility] hospital lab) Nasopharyngeal Nasopharyngeal Swab     Status: None   Collection Time: 02/12/20  1:08 AM   Specimen: Nasopharyngeal Swab  Result Value Ref Range Status   SARS Coronavirus 2 NEGATIVE NEGATIVE Final    Comment: (NOTE) SARS-CoV-2 target nucleic acids are NOT DETECTED.  The SARS-CoV-2 RNA is generally detectable in upper and lower respiratory specimens during the acute phase of infection. The lowest concentration of SARS-CoV-2 viral copies this assay can detect is 250 copies / mL. A negative result does not preclude SARS-CoV-2 infection and should not be used as the sole basis for treatment or other patient management decisions.  A negative result may occur with improper specimen collection / handling, submission of specimen other than nasopharyngeal swab, presence of viral mutation(s) within the areas targeted by this assay, and inadequate number of viral copies (<250 copies / mL). A negative result must be combined with clinical observations, patient history, and epidemiological information.  Fact Sheet for Patients:    StrictlyIdeas.no  Fact Sheet for Healthcare Providers: BankingDealers.co.za  This test is not yet approved or  cleared by the [place] FDA and has been authorized for detection and/or diagnosis of SARS-CoV-2 by FDA under an Emergency Use Authorization (EUA).  This EUA will remain in effect (meaning this test can be used) for the duration of the COVID-19 declaration under Section 564(b)(1) of the Act, 21 U.S.C. section 360bbb-3(b)(1), unless the authorization is terminated or revoked sooner.  Performed at [facility], [place] [address], [place], [place] [address]       Studies: DG Chest 2 View  Result Date: 02/11/2020 CLINICAL DATA:  Shortness of breath and productive cough for 2 days. EXAM: CHEST - 2 VIEW COMPARISON:  01/13/2020, 01/13/2017 FINDINGS: Mild cardiomegaly, unchanged from prior allowing for differences in  technique. No pulmonary edema. Chronic left basilar pleuroparenchymal scarring. Mild biapical pleuroparenchymal scarring. No acute airspace disease. No pneumothorax. No significant pleural fluid or confluent airspace disease. Osseous structures are intact. IMPRESSION: 1. No acute abnormality. 2. Stable mild cardiomegaly and left basilar pleuroparenchymal scarring. Electronically Signed   By: Keith Rake M.D.   On: 02/11/2020 18:21   CT Angio Chest PE W/Cm &/Or Wo Cm  Result Date: 02/12/2020 CLINICAL DATA:  Shortness of breath. EXAM: CT ANGIOGRAPHY CHEST WITH CONTRAST TECHNIQUE: Multidetector CT imaging of the chest was performed using the standard protocol during bolus administration of intravenous contrast. Multiplanar CT image reconstructions and MIPs were obtained to evaluate the vascular anatomy. CONTRAST:  118mL OMNIPAQUE IOHEXOL 350 MG/ML SOLN COMPARISON:  None. FINDINGS: Cardiovascular: Contrast injection is sufficient to demonstrate satisfactory opacification of the pulmonary arteries to the segmental level.  There is no pulmonary embolus or evidence of right heart strain. The size of the main pulmonary artery is normal. Heart size is normal, with no pericardial effusion. There are coronary artery calcifications. There are atherosclerotic changes of the thoracic aorta without evidence for an aneurysm there is significant reflux of contrast into the IVC. Mediastinum/Nodes: -- No mediastinal lymphadenopathy. -- No hilar lymphadenopathy. -- No axillary lymphadenopathy. -- No supraclavicular lymphadenopathy. -- Normal thyroid gland where visualized. -  Unremarkable esophagus. Lungs/Pleura: There are emphysematous changes. There is consolidation in the right upper lobe and left lower lobe with areas of bronchial wall thickening and mucus plugging. There is no pneumothorax. There is no significant pleural effusion. Upper Abdomen: Contrast bolus timing is not optimized for evaluation of the abdominal organs. The visualized portions of the organs of the upper abdomen are normal. Musculoskeletal: The patient is status post prior splenectomy. There are few small splenules in left upper quadrant. There is an apparent large ventral wall hernia containing fat. However, this hernia is only partially visualized. Review of the MIP images confirms the above findings. IMPRESSION: 1. No acute pulmonary embolism. 2. Findings concerning for infectious bronchiolitis (viral or bacterial) with areas of consolidation involving the right upper and left lower lobes. 3. Significant reflux of contrast into the IVC, suggestive of right heart dysfunction. Aortic Atherosclerosis (ICD10-I70.0) and Emphysema (ICD10-J43.9). Electronically Signed   By: Constance Holster M.D.   On: 02/12/2020 01:43    Scheduled Meds: . amiodarone  200 mg Oral Daily  . apixaban  5 mg Oral BID  . atorvastatin  40 mg Oral Daily  . calcium carbonate  0.5 tablet Oral Daily  . carvedilol  25 mg Oral BID WC  . fluticasone  2 spray Each Nare Daily  . furosemide  20 mg  Intravenous Daily  . levalbuterol  0.63 mg Nebulization Q6H  . methylPREDNISolone (SOLU-MEDROL) injection  40 mg Intravenous Daily  . mometasone-formoterol  2 puff Inhalation BID  . pantoprazole  40 mg Oral Daily  . polyethylene glycol  17 g Oral Daily  . sacubitril-valsartan  1 tablet Oral BID    Continuous Infusions: . [START ON 02/13/2020] azithromycin    . [START ON 02/13/2020] cefTRIAXone (ROCEPHIN)  IV       LOS: 0 days     Alma Friendly, MD [facility]  If 7PM-7AM, please contact night-coverage [email or web address] 02/12/2020, 1:35 PM

## 2020-02-12 NOTE — ED Notes (Signed)
Coughing non-productive  Pt has asthma

## 2020-02-12 NOTE — ED Notes (Signed)
Nasal 02 at 2  sats 91 % on nasal 02

## 2020-02-12 NOTE — H&P (Signed)
History and Physical    Erin Wong:[id number] DOB: [birth year] DOA: 02/11/2020  PCP: [name], MD  Patient coming from: Home.  Chief Complaint: Shortness of breath.  HPI: Erin Wong is a 69 y.o. female with history of A. fib recently diagnosed cardiomyopathy admitted last month for A. fib with RVR with CHF and was placed on amiodarone and Eliquis at that time has been having productive cough and shortness of breath for the last 1 week had gone to her primary care physician about 2 days ago and was advised symptomatic treatment.  Patient states that over the last 2 days patient has become more acutely short of breath no chest pain productive cough sometimes discolored yellowish.  Has lower extremity edema patient has been taking Lasix 20 mg daily.  ED Course: In the ER patient was in A. fib with RVR was started on Cardizem drip.  CT angiogram of the chest done shows features concerning for bronchiolitis viral versus bacterial with some consolidation in the right upper and lower lobes.  Patient had antibiotic started empirically.  On exam patient is also wheezing and has lower extremity edema.  Covid test is negative.  Labs show sodium 126 LFTs unremarkable WBC count is elevated at 17.2.  Review of Systems: As per HPI, rest all negative.   Past Medical History:  Diagnosis Date  . Allergy   . Arthritis   . Asthma   . Benign essential HTN   . CKD (chronic kidney disease) stage 3, GFR 30-59 ml/min   . HLD (hyperlipidemia)     Past Surgical History:  Procedure Laterality Date  . ABDOMINAL HYSTERECTOMY  10/04/1995   Hysterectomy and Bilateral oophorectomy  . CARDIOVERSION N/A 01/17/2020   Procedure: CARDIOVERSION;  Surgeon: [name], MD;  Location: [facility] ENDOSCOPY;  Service: Cardiovascular;  Laterality: N/A;  . COLON SURGERY  09/2009   hole in colon  . FRACTURE SURGERY Right 08/02/2011   Right Leg--Plates & Screws  . 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 TEE WITHOUT CARDIOVERSION N/A 01/17/2020   Procedure: TRANSESOPHAGEAL ECHOCARDIOGRAM (TEE);  Surgeon: [name], MD;  Location: [facility] ENDOSCOPY;  Service: Cardiovascular;  Laterality: N/A;     reports that she quit smoking about 29 years ago. She has never used smokeless tobacco. She reports that she does not drink alcohol and does not use drugs.  No Known Allergies  Family History  Problem Relation Age of Onset  . Arthritis Mother   . Cancer Mother        Lung Cancer--Had quit smoking for 25 years  . Miscarriages / [place] Mother   . Alcohol abuse Father   . Alcohol abuse Brother   . COPD Brother   . Asthma Maternal Aunt   . Diabetes Maternal Aunt     Prior to Admission medications   Medication Sig Start Date End Date Taking? Authorizing Provider  acetaminophen (TYLENOL) 650 MG CR tablet Take 2 tablets (1,300 mg total) by mouth 2 (two) times daily as needed for pain. 02/12/17  Yes Dena Billet B, PA-C  albuterol (PROVENTIL HFA;VENTOLIN HFA) 108 (90 Base) MCG/ACT inhaler Inhale 1 puff into the lungs every 6 (six) hours as needed for wheezing or shortness of breath. 01/05/18  Yes Orlena Sheldon, PA-C  amiodarone (PACERONE) 200 MG tablet Take 1 tablet (200mg ) twice daily Patient taking differently: Take 200 mg  by mouth 2 (two) times daily.  01/26/20  Yes Burtis Junes, NP  apixaban (ELIQUIS) 5 MG TABS tablet Take 1 tablet (5 mg total) by mouth 2 (two) times daily. 01/19/20  Yes Reino Bellis B, NP  atorvastatin (LIPITOR) 40 MG tablet TAKE 1 TABLET BY MOUTH EVERY DAY Patient taking differently: Take 40 mg by mouth daily.  10/04/19  Yes [name], MD  benzonatate (TESSALON) 100 MG capsule Take 1 capsule (100 mg total) by mouth 2 (two) times daily as needed for cough. 02/10/20  Yes Bates, Crystal A, FNP  calcium gluconate 500 MG tablet Take 1 tablet by mouth daily.   Yes [provider]  carvedilol (COREG) 12.5 MG tablet Take 1 tablet (12.5 mg total) by mouth 2 (two) times daily with a meal. 01/26/20  Yes Burtis Junes, NP  cholecalciferol (VITAMIN D) 1000 units tablet Take 1 tablet (1,000 Units total) by mouth daily. 08/05/16  Yes Dena Billet B, PA-C  fluticasone (FLONASE) 50 MCG/ACT nasal spray Place 2 sprays into both nostrils daily. 02/10/20  Yes Bates, Crystal A, FNP  Fluticasone-Salmeterol (ADVAIR DISKUS) 100-50 MCG/DOSE AEPB INHALE 1 PUFF INTO THE LUNGS TWICE A DAY Patient taking differently: Inhale 1 puff into the lungs in the morning and at bedtime.  12/17/19  Yes [name], MD  furosemide (LASIX) 20 MG tablet Take 1 tablet (20 mg total) by mouth daily. 01/26/20  Yes Burtis Junes, NP  omeprazole (PRILOSEC) 20 MG capsule TAKE 1 CAPSULE BY MOUTH EVERY DAY Patient taking differently: Take 20 mg by mouth daily.  01/27/20  Yes [name], MD  polyethylene glycol (MIRALAX / GLYCOLAX) 17 g packet Take 17 g by mouth daily.   Yes [provider]  sacubitril-valsartan (ENTRESTO) 24-26 MG Take 1 tablet by mouth 2 (two) times daily. 01/19/20  Yes Cheryln Manly, NP  traMADol (ULTRAM) 50 MG tablet TAKE 2 TABLETS BY MOUTH 3 TIMES A DAY AS NEEDED FOR PAIN Patient taking differently: Take 100 mg by mouth 3 (three) times daily as needed for moderate pain.  11/22/19  Yes [name], MD  polyethylene glycol powder (GLYCOLAX/MIRALAX) powder Take 255 g by mouth 1 day or 1 dose. Patient not taking: Reported on 02/11/2020 08/05/16   Orlena Sheldon, PA-C    Physical Exam: Constitutional: Moderately built and nourished. Vitals:   02/12/20 0357 02/12/20 0358 02/12/20 0359 02/12/20 0400  BP:      Pulse: 78 90 89 85  Resp: (!) 21 20 (!) 22 18  Temp:      TempSrc:      SpO2: 91% 91% 92% 92%   Eyes: Anicteric no pallor. ENMT: No discharge from the ears eyes nose or mouth. Neck: JVD elevated. Respiratory: Bilateral expiratory wheeze at present.   No crepitations. Cardiovascular: S1-S2 heard. Abdomen: Soft nontender bowel sounds present. Musculoskeletal: Bilateral lower extremity edema present. Skin: No rash. Neurologic: Alert awake oriented time place and person.  Moves all extremities. Psychiatric: Appears normal per normal affect.   Labs on Admission: I have personally reviewed following labs and imaging studies  CBC: Recent Labs  Lab 02/11/20 1703  WBC 17.2*  HGB 13.0  HCT 40.0  MCV 97.3  PLT 073   Basic Metabolic Panel: Recent Labs  Lab 02/11/20 1703  NA 126*  K 3.6  CL 92*  CO2 23  GLUCOSE 128*  BUN 10  CREATININE 0.93  CALCIUM 8.5*   GFR: Estimated Creatinine Clearance: 54.4 mL/min (  by C-G formula based on SCr of 0.93 mg/dL). Liver Function Tests: Recent Labs  Lab 02/12/20 0030  AST 31  ALT 33  ALKPHOS 59  BILITOT 0.6  PROT 5.7*  ALBUMIN 3.1*   Recent Labs  Lab 02/11/20 1703  LIPASE 20   No results for input(s): AMMONIA in the last 168 hours. Coagulation Profile: No results for input(s): INR, PROTIME in the last 168 hours. Cardiac Enzymes: No results for input(s): CKTOTAL, CKMB, CKMBINDEX, TROPONINI in the last 168 hours. BNP (last 3 results) No results for input(s): PROBNP in the last 8760 hours. HbA1C: No results for input(s): HGBA1C in the last 72 hours. CBG: No results for input(s): GLUCAP in the last 168 hours. Lipid Profile: No results for input(s): CHOL, HDL, LDLCALC, TRIG, CHOLHDL, LDLDIRECT in the last 72 hours. Thyroid Function Tests: No results for input(s): TSH, T4TOTAL, FREET4, T3FREE, THYROIDAB in the last 72 hours. Anemia Panel: No results for input(s): VITAMINB12, FOLATE, FERRITIN, TIBC, IRON, RETICCTPCT in the last 72 hours. Urine analysis:    Component Value Date/Time   COLORURINE STRAW (A) 01/13/2017 2028   APPEARANCEUR CLEAR 01/13/2017 2028   LABSPEC 1.012 01/13/2017 2028   PHURINE 5.0 01/13/2017 2028   GLUCOSEU NEGATIVE 01/13/2017 2028   HGBUR NEGATIVE  01/13/2017 2028   BILIRUBINUR NEGATIVE 01/13/2017 2028   KETONESUR 5 (A) 01/13/2017 2028   PROTEINUR NEGATIVE 01/13/2017 2028   NITRITE NEGATIVE 01/13/2017 2028   LEUKOCYTESUR NEGATIVE 01/13/2017 2028   Sepsis Labs: @LABRCNTIP (procalcitonin:4,lacticidven:4) ) Recent Results (from the past 240 hour(s))  SARS-COV-2 RNA,(COVID-19) QUAL NAAT     Status: None   Collection Time: 02/10/20  9:34 AM   Specimen: Respiratory  Result Value Ref Range Status   SARS CoV2 RNA NOT DETECTED NOT DETECT Final    Comment: . A Not Detected (negative) test result for this test means that SARS-CoV-2 RNA was not present in the specimen above the limit of detection. A negative result does not rule out the possibility of COVID-19 and should not be used as the sole basis for treatment or patient management  decisions.  If COVID-19 is still suspected, based on  exposure history together with other clinical findings, re-testing should be considered in consultation with public health authorities. Laboratory test results should always be considered in the context of clinical  observations and epidemiological data in making a final diagnosis and patient management decisions. . This patient specimen was tested using an FDA EUA pooling method. . Patient specimens with low viral loads may not be detected in sample pools due to the decreased sensitivity of  pooled testing. . Please review the "Fact Sheets" and FDA authorized labeling available for health care providers and patients using the foll owing websites: https://www.[email or web address]/home/Covid-19/[email or web address]/fact-sheet2  [email or web address]://www.[facility].com/home/Covid-19/Patients/NAAT/ fact-sheet2 . This test has been authorized by the FDA under an  Emergency Use Authorization (EUA) for use by authorized laboratories. . Due to the current public health emergency, [facility] is receiving a high volume of samples from a wide variety of swabs  and media for COVID-19 testing. In order to serve patients during this public health crisis, samples from appropriate clinical sources are  being tested. Negative test results derived from specimens received in non-commercially manufactured viral collection and transport media, or in media and sample collection kits not yet authorized by FDA for COVID-19 testing should be cautiously evaluated and the patient potentially subjected to extra precautions such as additional clinical monitoring, including collection of an additional specimen. . Methodology:  Nucle ic Acid  Amplification Test (NAAT) includes RT-PCR or TMA . Additional information about COVID-19 can be found at the [employer] website: www.[email or web address].com/[email or web address].   SARS Coronavirus 2 by RT PCR (hospital order, performed in [facility] hospital lab) Nasopharyngeal Nasopharyngeal Swab     Status: None   Collection Time: 02/12/20  1:08 AM   Specimen: Nasopharyngeal Swab  Result Value Ref Range Status   SARS Coronavirus 2 NEGATIVE NEGATIVE Final    Comment: (NOTE) SARS-CoV-2 target nucleic acids are NOT DETECTED.  The SARS-CoV-2 RNA is generally detectable in upper and lower respiratory specimens during the acute phase of infection. The lowest concentration of SARS-CoV-2 viral copies this assay can detect is 250 copies / mL. A negative result does not preclude SARS-CoV-2 infection and should not be used as the sole basis for treatment or other patient management decisions.  A negative result may occur with improper specimen collection / handling, submission of specimen other than nasopharyngeal swab, presence of viral mutation(s) within the areas targeted by this assay, and inadequate number of viral copies (<250 copies / mL). A negative result must be combined with clinical observations, patient history, and epidemiological information.  Fact Sheet for Patients:    StrictlyIdeas.no  Fact Sheet for Healthcare Providers: BankingDealers.co.za  This test is not yet approved or  cleared by the [place] FDA and has been authorized for detection and/or diagnosis of SARS-CoV-2 by FDA under an Emergency Use Authorization (EUA).  This EUA will remain in effect (meaning this test can be used) for the duration of the COVID-19 declaration under Section 564(b)(1) of the Act, 21 U.S.C. section 360bbb-3(b)(1), unless the authorization is terminated or revoked sooner.  Performed at [facility], [place] [address] [place], [place] [address]      Radiological Exams on Admission: DG Chest 2 View  Result Date: 02/11/2020 CLINICAL DATA:  Shortness of breath and productive cough for 2 days. EXAM: CHEST - 2 VIEW COMPARISON:  01/13/2020, 01/13/2017 FINDINGS: Mild cardiomegaly, unchanged from prior allowing for differences in technique. No pulmonary edema. Chronic left basilar pleuroparenchymal scarring. Mild biapical pleuroparenchymal scarring. No acute airspace disease. No pneumothorax. No significant pleural fluid or confluent airspace disease. Osseous structures are intact. IMPRESSION: 1. No acute abnormality. 2. Stable mild cardiomegaly and left basilar pleuroparenchymal scarring. Electronically Signed   By: Keith Rake M.D.   On: 02/11/2020 18:21   CT Angio Chest PE W/Cm &/Or Wo Cm  Result Date: 02/12/2020 CLINICAL DATA:  Shortness of breath. EXAM: CT ANGIOGRAPHY CHEST WITH CONTRAST TECHNIQUE: Multidetector CT imaging of the chest was performed using the standard protocol during bolus administration of intravenous contrast. Multiplanar CT image reconstructions and MIPs were obtained to evaluate the vascular anatomy. CONTRAST:  111mL OMNIPAQUE IOHEXOL 350 MG/ML SOLN COMPARISON:  None. FINDINGS: Cardiovascular: Contrast injection is sufficient to demonstrate satisfactory opacification of the pulmonary arteries to  the segmental level. There is no pulmonary embolus or evidence of right heart strain. The size of the main pulmonary artery is normal. Heart size is normal, with no pericardial effusion. There are coronary artery calcifications. There are atherosclerotic changes of the thoracic aorta without evidence for an aneurysm there is significant reflux of contrast into the IVC. Mediastinum/Nodes: -- No mediastinal lymphadenopathy. -- No hilar lymphadenopathy. -- No axillary lymphadenopathy. -- No supraclavicular lymphadenopathy. -- Normal thyroid gland where visualized. -  Unremarkable esophagus. Lungs/Pleura: There are emphysematous changes. There is consolidation in the right upper lobe and left lower lobe with areas of bronchial wall thickening and  mucus plugging. There is no pneumothorax. There is no significant pleural effusion. Upper Abdomen: Contrast bolus timing is not optimized for evaluation of the abdominal organs. The visualized portions of the organs of the upper abdomen are normal. Musculoskeletal: The patient is status post prior splenectomy. There are few small splenules in left upper quadrant. There is an apparent large ventral wall hernia containing fat. However, this hernia is only partially visualized. Review of the MIP images confirms the above findings. IMPRESSION: 1. No acute pulmonary embolism. 2. Findings concerning for infectious bronchiolitis (viral or bacterial) with areas of consolidation involving the right upper and left lower lobes. 3. Significant reflux of contrast into the IVC, suggestive of right heart dysfunction. Aortic Atherosclerosis (ICD10-I70.0) and Emphysema (ICD10-J43.9). Electronically Signed   By: Constance Holster M.D.   On: 02/12/2020 01:43    EKG: Independently reviewed.  A. fib with RVR.  Assessment/Plan Principal Problem:   Acute respiratory failure with hypoxia (HCC) Active Problems:   Asthma   CKD (chronic kidney disease) stage 3, GFR 30-59 ml/min   Benign  essential HTN   Atrial fibrillation with rapid ventricular response (HCC)   Acute systolic heart failure (HCC)   Acute bronchitis    1. Acute respiratory failure with hypoxia likely a combination of CHF and bronchitis/pneumonia. 2. Possible pneumonia versus bronchitis for which I have placed patient on empiric antibiotics sputum cultures and also since patient is wheezing we will keep patient on nebulizer steroids. 3. Acute on chronic CHF recent EF 30 to 35% in May last month.  Patient is on Lasix 20 mg IV daily.  Likely precipitated by A. fib with RVR and pneumonia. 4. A. fib with RVR was started on Cardizem drip.  Patient is on amiodarone and metoprolol when taken we will try to wean off the Cardizem drip.  Eliquis for anticoagulation. 5. History of asthma presently actively wheezing.  On IV steroids and nebulizer and inhaled steroids.  Given that patient has acute CHF and pneumonia with A. fib with RVR will need more than 2 midnight stay in inpatient status.   DVT prophylaxis: Eliquis. Code Status: Full code. Family Communication: Patient's husband. Disposition Plan: Home. Consults called: Cardiology. Admission status: Inpatient.   Rise Patience MD [facility] Pager [phone].  If 7PM-7AM, please contact night-coverage [email or web address] Password [facility]  02/12/2020, 4:06 AM

## 2020-02-12 NOTE — Consult Note (Addendum)
Cardiology Consultation:   Patient ID: Erin Wong MRN: [id number]; DOB: 04/18/1951  Admit date: 02/11/2020 Date of Consult: 02/12/2020  Primary Care Provider: [name], MD [facility] Cardiologist: Erin Rouge, MD  [place] Electrophysiologist:  None    Patient Profile:   Erin Wong is a 69 y.o. 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 Hal Wong.  History of Present Illness:   Ms. Erin Wong is a 69 year old female with the above history. Patient recently admitted from 01/13/2020 to 01/19/2020 for atrial fibrillation with RVR and new onset systolic CHF. Echo showed LVEF of 30-35% with global hypokinesis. 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 She was started on IV Cardizem and underwent TEE/DCCV on 01/17/2020. Unfortunately, she promptly reverted back to atrial fibrillation so was started on Amiodarone with plans to repeat cardioversion in 4 weeks after adequate Amiodarone load. She also was diuresed and started on GDMT for CHF. She was discharged on Amiodarone, Coreg, Entresto, and Eliquis. She was seen by Erin Merle, NP, for follow-up on 01/26/2020 at which time she wad doing well and was not aware of her atrial fibrillation. She was tolerating medications well.   Patient presented to the [facility] ED on 02/11/2020 for evaluation of shortness of breath and cough as well as upper abdominal pain. Patient reports shortness of breath and productive cough that started about 1 week ago. She states she just feels short of breath all the times and states it is worse when lying flat. She describes orthopnea, having to sleep in a recliner  one night this week, and some PND. Weights are stable and no significant lower extremity edema. She only has chest pain when she coughs. Also has some upper abdominal pain but attributes this to significant coughing as well. She states she has had some off and on fevers at home with temp as high at 99.9 but denies any body aches or chills. No palpitations, lightheadedness, dizziness, or syncope. No abnormal bleeding including hemoptysis, hematuria, or hematochezia.   In the ED, patient mildly tachycardic and tachypneic at times. EKG showed atrial fibrillation, rate 110 bmp, with a lot of baseline wandering but ST depression in leads V4-V5. High-sensitivity troponin negative x2. BNP elevated at 602 (983 during admission in 01/2020). Chest x-ray showed stable mild cardiomegaly and left basilar pleuroparenchymal scarring but no acute findings. Chest CTA negative for PE but concerning for infectious bronchiolitis with areas of consolidation involving the right upper and left lower lobes. Also showed significant reflux of contrast into the IVC, suggestive of right heart dysfunction. WBC 17.2, Hgb 13.0, Plts 388. Na 126, K 3.6, Glucose 128, BUN 10, Cr 0.93. Lipase normal. Albumin slightly low at 3.1 but LFTs normal. COVID-19 negative. Patient was admitted and started on empiric antibiotics, IV Lasix, and IV Cardizem.    Past Medical History:  Diagnosis Date   Allergy    Arthritis    Asthma    Benign essential HTN    CKD (chronic kidney disease) stage 3, GFR 30-59 ml/min    HLD (hyperlipidemia)     Past Surgical History:  Procedure Laterality Date   ABDOMINAL HYSTERECTOMY  10/04/1995   Hysterectomy and Bilateral oophorectomy   CARDIOVERSION  N/A 01/17/2020   Procedure: CARDIOVERSION;  Surgeon: [name], MD;  Location: [facility] ENDOSCOPY;  Service: Cardiovascular;  Laterality: N/A;   COLON SURGERY  09/2009   hole in colon   FRACTURE SURGERY Right 08/02/2011   Right Leg--Plates & Screws   HAND SURGERY  Right 09/02/2006   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--Arthritis   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 Authorizing Provider  acetaminophen (TYLENOL) 650 MG CR tablet Take 2 tablets (1,300 mg total) by mouth 2 (two) times daily as needed for pain. 02/12/17  Yes Dena Billet B, PA-C  albuterol (PROVENTIL HFA;VENTOLIN HFA) 108 (90 Base) MCG/ACT inhaler Inhale 1 puff into the lungs every 6 (six) hours as needed for wheezing or shortness of breath. 01/05/18  Yes Orlena Sheldon, PA-C  amiodarone (PACERONE) 200 MG tablet Take 1 tablet (200mg ) twice daily Patient taking differently: Take 200 mg by mouth 2 (two) times daily.  01/26/20  Yes Burtis Junes, NP  apixaban (ELIQUIS) 5 MG TABS tablet Take 1 tablet (5 mg total) by mouth 2 (two) times daily. 01/19/20  Yes Reino Bellis B, NP  atorvastatin (LIPITOR) 40 MG tablet TAKE 1 TABLET BY MOUTH EVERY DAY Patient taking differently: Take 40 mg by mouth daily.  10/04/19  Yes Erin Frizzle, MD  benzonatate (TESSALON) 100 MG capsule Take 1 capsule (100 mg total) by mouth 2 (two) times daily as needed for cough. 02/10/20  Yes Bates, Crystal A, FNP  calcium gluconate 500 MG tablet Take 1 tablet by mouth daily.   Yes [provider]  carvedilol (COREG) 12.5 MG tablet Take 1 tablet (12.5 mg total) by mouth 2 (two) times daily with a meal. 01/26/20  Yes Burtis Junes, NP  cholecalciferol (VITAMIN D) 1000 units tablet Take 1 tablet (1,000 Units total) by mouth daily. 08/05/16  Yes Dena Billet B, PA-C  fluticasone (FLONASE) 50 MCG/ACT nasal spray Place 2 sprays into both nostrils daily. 02/10/20  Yes Bates, Crystal A, FNP  Fluticasone-Salmeterol (ADVAIR  DISKUS) 100-50 MCG/DOSE AEPB INHALE 1 PUFF INTO THE LUNGS TWICE A DAY Patient taking differently: Inhale 1 puff into the lungs in the morning and at bedtime.  12/17/19  Yes Erin Frizzle, MD  furosemide (LASIX) 20 MG tablet Take 1 tablet (20 mg total) by mouth daily. 01/26/20  Yes Burtis Junes, NP  omeprazole (PRILOSEC) 20 MG capsule TAKE 1 CAPSULE BY MOUTH EVERY DAY Patient taking differently: Take 20 mg by mouth daily.  01/27/20  Yes Erin Frizzle, MD  polyethylene glycol (MIRALAX / GLYCOLAX) 17 g packet Take 17 g by mouth daily.   Yes [provider]  sacubitril-valsartan (ENTRESTO) 24-26 MG Take 1 tablet by mouth 2 (two) times daily. 01/19/20  Yes Cheryln Manly, NP  traMADol (ULTRAM) 50 MG tablet TAKE 2 TABLETS BY MOUTH 3 TIMES A DAY AS NEEDED FOR PAIN Patient taking differently: Take 100 mg by mouth 3 (three) times daily as needed for moderate pain.  11/22/19  Yes Erin Frizzle, MD  polyethylene glycol powder (GLYCOLAX/MIRALAX) powder Take 255 g by mouth 1 day or 1 dose. Patient not taking: Reported on 02/11/2020 08/05/16   Orlena Sheldon,  PA-C    Inpatient Medications: Scheduled Meds:  amiodarone  200 mg Oral Daily   apixaban  5 mg Oral BID   atorvastatin  40 mg Oral Daily   calcium carbonate  0.5 tablet Oral Daily   carvedilol  12.5 mg Oral BID WC   fluticasone  2 spray Each Nare Daily   furosemide  20 mg Intravenous Daily   levalbuterol  0.63 mg Nebulization Q6H   methylPREDNISolone (SOLU-MEDROL) injection  40 mg Intravenous Daily   mometasone-formoterol  2 puff Inhalation BID   pantoprazole  40 mg Oral Daily   polyethylene glycol  17 g Oral Daily   sacubitril-valsartan  1 tablet Oral BID   Continuous Infusions:  [START ON 02/13/2020] azithromycin     [START ON 02/13/2020] cefTRIAXone (ROCEPHIN)  IV     diltiazem (CARDIZEM) infusion 7.5 mg/hr (02/12/20 0315)   PRN Meds: ondansetron **OR** ondansetron (ZOFRAN) IV, traMADol  Allergies:   No Known  Allergies  Social History:   Social History   Socioeconomic History   Marital status: Married    Spouse name: Not on file   Number of children: Not on file   Years of education: Not on file   Highest education level: Not on file  Occupational History   Not on file  Tobacco Use   Smoking status: Former Smoker    Quit date: 01/10/1991    Years since quitting: 29.1   Smokeless tobacco: Never Used  Vaping Use   Vaping Use: Never used  Substance and Sexual Activity   Alcohol use: No   Drug use: No   Sexual activity: Never  Other Topics Concern   Not on file  Social History Narrative   Not on file   Social Determinants of Health   Financial Resource Strain:    Difficulty of Paying Living Expenses:   Food Insecurity:    Worried About Charity fundraiser in the Last Year:    Arboriculturist in the Last Year:   Transportation Needs:    Film/video editor (Medical):    Lack of Transportation (Non-Medical):   Physical Activity:    Days of Exercise per Week:    Minutes of Exercise per Session:   Stress:    Feeling of Stress :   Social Connections:    Frequency of Communication with Friends and Family:    Frequency of Social Gatherings with Friends and Family:    Attends Religious Services:    Active Member of Clubs or Organizations:    Attends Music therapist:    Marital Status:   Intimate Partner Violence:    Fear of Current or Ex-Partner:    Emotionally Abused:    Physically Abused:    Sexually Abused:     Family History:    Family History  Problem Relation Age of Onset   Arthritis Mother    Cancer Mother        Lung Cancer--Had quit smoking for 55 years   71 / [place] Mother    Alcohol abuse Father    Alcohol abuse Brother    COPD Brother    Asthma Maternal Aunt    Diabetes Maternal Aunt      ROS:  Please see the history of present illness.  All other ROS reviewed and negative.     Physical Exam/Data:   Vitals:    02/12/20 0405 02/12/20 0406 02/12/20 0407 02/12/20 0448  BP:    123/74  Pulse: (!) 106 (!)  117 85 (!) 102  Resp: (!) 23 (!) 24 (!) 27 (!) 22  Temp:    98.6 F (37 C)  TempSrc:      SpO2: 91% 92% 94% 93%    Intake/Output Summary (Last 24 hours) at 02/12/2020 0815 Last data filed at 02/12/2020 0448 Gross per 24 hour  Intake 360 ml  Output --  Net 360 ml   Last 3 Weights 02/10/2020 01/26/2020 01/19/2020  Weight (lbs) 152 lb 152 lb 150 lb 11.2 oz  Weight (kg) 68.947 kg 68.947 kg 68.357 kg     There is no height or weight on file to calculate BMI.  General: 69 y.o. female resting comfortably in no acute distress. HEENT: Normocephalic and atraumatic. Sclera clear.  Neck: Supple. No carotid bruits. No JVD. Heart: Irregularly irregular rhythm with normal heart rate. Distinct S1 and S2. No murmurs, gallops, or rubs. Radial and distal pedal pulses 2+ and equal bilaterally. Lungs: No significant increased work of breathing. Course breath sounds throughout with some wheezing. Abdomen: Soft and non-distended with some upper abdominal tenderness to palpation. Bowel sounds present. MSK: Normal strength and tone for age. Extremities: No clubbing, cyanosis, or edema.    Skin: Warm and dry. Neuro: Alert and oriented x3. No focal deficits. Psych: Normal affect. Responds appropriately.  EKG:  The EKG was personally reviewed and demonstrates:  Atrial fibrillation, rate 110 bpm, with a lot of baseline wandering but some ST depression in V4-V5.   Telemetry:  Telemetry was personally reviewed and demonstrates:  Atrial fibrillation with rates in the 80's to low 100's.  Relevant CV Studies:  Transthoracic Echocardiogram 01/14/2020: Impressions:  1. Left ventricular ejection fraction, by estimation, is 30 to 35%. The  left ventricle has moderately decreased function. The left ventricle  demonstrates global hypokinesis. The left ventricular internal cavity size  was mildly dilated. Left ventricular   diastolic parameters are indeterminate. There is akinesis of the left  ventricular, mid-apical inferior wall, anterior wall, inferolateral wall,  anterolateral wall and lateral wall. There is akinesis of the left  ventricular, apical septal wall and apical  segment.   2. Right ventricular systolic function is mildly reduced. The right  ventricular size is mildly enlarged. There is moderately elevated  pulmonary artery systolic pressure. The estimated right ventricular  systolic pressure is 13.2 mmHg.   3. Left atrial size was severely dilated.   4. The mitral valve is normal in structure. Mild to moderate mitral valve  regurgitation. No evidence of mitral stenosis.   5. Tricuspid valve regurgitation is mild to moderate.   6. The aortic valve is normal in structure. Aortic valve regurgitation is  trivial. No aortic stenosis is present.   7. The inferior vena cava is dilated in size with <50% respiratory  variability, suggesting right atrial pressure of 15 mmHg.  _______________  Transesophageal Echocardiogram 01/17/2020: Impressions:  1. Patient in rapid afib. No LAA thrombus On Eliquis DCC x 1 150J  biphasic converted wit NSR with PaC;s and salvo's of recurrent PACls BP  somewhat low post conversions Rx with one bolus Neo and will start  amiodarone drip.   2. Left ventricular ejection fraction, by estimation, is 30 to 35%. The  left ventricle has moderately decreased function. The left ventricle  demonstrates global hypokinesis. The left ventricular internal cavity size  was moderately dilated. Left  ventricular diastolic parameters are indeterminate.   3. Right ventricular systolic function is moderately reduced. The right  ventricular size is moderately enlarged.   4.  Left atrial size was moderately dilated. No left atrial/left atrial  appendage thrombus was detected.   5. Right atrial size was moderately dilated.   6. The mitral valve is normal in structure. Moderate to severe  mitral  valve regurgitation. No evidence of mitral stenosis.   7. Tricuspid valve regurgitation is moderate.   8. The aortic valve is tricuspid. Aortic valve regurgitation is trivial.  No aortic stenosis is present.   9. The inferior vena cava is normal in size with greater than 50%  respiratory variability, suggesting right atrial pressure of 3 mmHg.   Conclusion(s)/Recommendation(s): Normal biventricular function without  evidence of hemodynamically significant valvular heart disease.  Laboratory Data:  High Sensitivity Troponin:   Recent Labs  Lab 01/13/20 1403 01/13/20 1556 02/12/20 0030 02/12/20 0502  TROPONINIHS 28* 24* 12 17     Chemistry Recent Labs  Lab 02/11/20 1703  NA 126*  K 3.6  CL 92*  CO2 23  GLUCOSE 128*  BUN 10  CREATININE 0.93  CALCIUM 8.5*  GFRNONAA >60  GFRAA >60  ANIONGAP 11    Recent Labs  Lab 02/12/20 0030  PROT 5.7*  ALBUMIN 3.1*  AST 31  ALT 33  ALKPHOS 59  BILITOT 0.6   Hematology Recent Labs  Lab 02/11/20 1703  WBC 17.2*  RBC 4.11  HGB 13.0  HCT 40.0  MCV 97.3  MCH 31.6  MCHC 32.5  RDW 13.5  PLT 388   BNP Recent Labs  Lab 02/12/20 0030  BNP 602.0*    DDimer No results for input(s): DDIMER in the last 168 hours.   Radiology/Studies:  DG Chest 2 View  Result Date: 02/11/2020 CLINICAL DATA:  Shortness of breath and productive cough for 2 days. EXAM: CHEST - 2 VIEW COMPARISON:  01/13/2020, 01/13/2017 FINDINGS: Mild cardiomegaly, unchanged from prior allowing for differences in technique. No pulmonary edema. Chronic left basilar pleuroparenchymal scarring. Mild biapical pleuroparenchymal scarring. No acute airspace disease. No pneumothorax. No significant pleural fluid or confluent airspace disease. Osseous structures are intact. IMPRESSION: 1. No acute abnormality. 2. Stable mild cardiomegaly and left basilar pleuroparenchymal scarring. Electronically Signed   By: Keith Rake M.D.   On: 02/11/2020 18:21   CT  Angio Chest PE W/Cm &/Or Wo Cm  Result Date: 02/12/2020 CLINICAL DATA:  Shortness of breath. EXAM: CT ANGIOGRAPHY CHEST WITH CONTRAST TECHNIQUE: Multidetector CT imaging of the chest was performed using the standard protocol during bolus administration of intravenous contrast. Multiplanar CT image reconstructions and MIPs were obtained to evaluate the vascular anatomy. CONTRAST:  133mL OMNIPAQUE IOHEXOL 350 MG/ML SOLN COMPARISON:  None. FINDINGS: Cardiovascular: Contrast injection is sufficient to demonstrate satisfactory opacification of the pulmonary arteries to the segmental level. There is no pulmonary embolus or evidence of right heart strain. The size of the main pulmonary artery is normal. Heart size is normal, with no pericardial effusion. There are coronary artery calcifications. There are atherosclerotic changes of the thoracic aorta without evidence for an aneurysm there is significant reflux of contrast into the IVC. Mediastinum/Nodes: -- No mediastinal lymphadenopathy. -- No hilar lymphadenopathy. -- No axillary lymphadenopathy. -- No supraclavicular lymphadenopathy. -- Normal thyroid gland where visualized. -  Unremarkable esophagus. Lungs/Pleura: There are emphysematous changes. There is consolidation in the right upper lobe and left lower lobe with areas of bronchial wall thickening and mucus plugging. There is no pneumothorax. There is no significant pleural effusion. Upper Abdomen: Contrast bolus timing is not optimized for evaluation of the abdominal organs. The visualized portions of  the organs of the upper abdomen are normal. Musculoskeletal: The patient is status post prior splenectomy. There are few small splenules in left upper quadrant. There is an apparent large ventral wall hernia containing fat. However, this hernia is only partially visualized. Review of the MIP images confirms the above findings. IMPRESSION: 1. No acute pulmonary embolism. 2. Findings concerning for infectious  bronchiolitis (viral or bacterial) with areas of consolidation involving the right upper and left lower lobes. 3. Significant reflux of contrast into the IVC, suggestive of right heart dysfunction. Aortic Atherosclerosis (ICD10-I70.0) and Emphysema (ICD10-J43.9). Electronically Signed   By: Constance Holster M.D.   On: 02/12/2020 01:43    Assessment and Plan:   Persistent Atrial Fibrillation with RVR - Failed DCCV last month and was started on Amiodarone with plans for repeat DCCV after adequate load. - Rates currently in the 80's to low 100's. - Potassium 3.6. Goal >4.0. Supplement as needed.  - Will check Magnesium. - LVEF of 30-35% on Echo in 01/2020.  - Currently on IV Cardizem. Will discontinue this given reduced EF and increase Coreg to 25mg  twice daily.  - Continue Amiodarone 200mg  daily. - Continue Eliquis 5mg  twice daily. - Will tentatively plan for DCCV on Monday but this may need to be postponed for further treatment of pneumonia. Unfortunately, patient missed one dose of Eliquis last night while in the ED. Will discuss with MD about wether repeat TEE is needed.  Acute on Chronic Systolic CHF - BNP elevated in the 600's.  - No overt edema on chest x-ray on CT scan. - TEE in 01/2020 showed LVEF of 30-35% with global hypokinesis. RV also moderately enlarged with moderated reduced systolic function. Also noted to have moderate to severe MR and moderate TR. - Currently on IV Lasix 20mg  daily. Continue for now. - Continue Coreg and Entresto. - Monitor daily weight, strict I/O's, and renal function.  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 Patient will need repeat Echo after restoration of sinus rhythm. If EF has not improved, will need ischemic work-up. No angina.  Bronchitis/Possible Pneumonia - Continue antibiotics per primary team.  Asthma - Wheezing on exam - Continue IV steroids and breathing treatments per primary team.   Hyperlipidemia - Continue statin.  For questions  or updates, please contact [place] Please consult [email or web address] for contact info under    Signed, Eppie Gibson  02/12/2020 8:15 AM  Patient examined chart reviewed Discussed care with patient , husband and PA.  Exam with course cough and congestion JVP normal PMI increased MR murmur rhonchi bilateral Telemetry with afib. Rates 100 bpm Agree with d/c cardizem given low EF. Increase coreg She did miss one dose eliquis last night but TEE 01/17/20 no LAA thrombus Has had good amiodarone load. Would not consider repeat TEE with pneumonia Likely ok for [facility] Monday / Tuesday if pneumonia clears. On Azythromycin and Rocephin per primary team Continue BID lasix with trace edema and low EF   Erin Rouge MD [place]

## 2020-02-13 DIAGNOSIS — I10 Essential (primary) hypertension: Secondary | ICD-10-CM

## 2020-02-13 LAB — PROTIME-INR
INR: 1.4 — ABNORMAL HIGH (ref 0.8–1.2)
Prothrombin Time: 16.3 seconds — ABNORMAL HIGH (ref 11.4–15.2)

## 2020-02-13 LAB — CBC WITH DIFFERENTIAL/PLATELET
Abs Immature Granulocytes: 0.07 10*3/uL (ref 0.00–0.07)
Basophils Absolute: 0 10*3/uL (ref 0.0–0.1)
Basophils Relative: 0 %
Eosinophils Absolute: 0 10*3/uL (ref 0.0–0.5)
Eosinophils Relative: 0 %
HCT: 36.4 % (ref 36.0–46.0)
Hemoglobin: 12.3 g/dL (ref 12.0–15.0)
Immature Granulocytes: 1 %
Lymphocytes Relative: 11 %
Lymphs Abs: 1.7 10*3/uL (ref 0.7–4.0)
MCH: 32.3 pg (ref 26.0–34.0)
MCHC: 33.8 g/dL (ref 30.0–36.0)
MCV: 95.5 fL (ref 80.0–100.0)
Monocytes Absolute: 1.3 10*3/uL — ABNORMAL HIGH (ref 0.1–1.0)
Monocytes Relative: 9 %
Neutro Abs: 12 10*3/uL — ABNORMAL HIGH (ref 1.7–7.7)
Neutrophils Relative %: 79 %
Platelets: 330 10*3/uL (ref 150–400)
RBC: 3.81 MIL/uL — ABNORMAL LOW (ref 3.87–5.11)
RDW: 13.5 % (ref 11.5–15.5)
WBC: 15.1 10*3/uL — ABNORMAL HIGH (ref 4.0–10.5)
nRBC: 0 % (ref 0.0–0.2)

## 2020-02-13 LAB — BASIC METABOLIC PANEL
Anion gap: 12 (ref 5–15)
Anion gap: 12 (ref 5–15)
BUN: 11 mg/dL (ref 8–23)
BUN: 11 mg/dL (ref 8–23)
CO2: 23 mmol/L (ref 22–32)
CO2: 22 mmol/L (ref 22–32)
Calcium: 7.8 mg/dL — ABNORMAL LOW (ref 8.9–10.3)
Calcium: 8.2 mg/dL — ABNORMAL LOW (ref 8.9–10.3)
Chloride: 92 mmol/L — ABNORMAL LOW (ref 98–111)
Chloride: 97 mmol/L — ABNORMAL LOW (ref 98–111)
Creatinine, Ser: 0.82 mg/dL (ref 0.44–1.00)
Creatinine, Ser: 0.9 mg/dL (ref 0.44–1.00)
GFR calc Af Amer: >60 mL/min (ref >60)
GFR calc Af Amer: >60 mL/min (ref >60)
GFR calc non Af Amer: >60 mL/min (ref >60)
GFR calc non Af Amer: >60 mL/min (ref >60)
Glucose, Bld: 133 mg/dL — ABNORMAL HIGH (ref 70–99)
Glucose, Bld: 162 mg/dL — ABNORMAL HIGH (ref 70–99)
Potassium: 3.2 mmol/L — ABNORMAL LOW (ref 3.5–5.1)
Potassium: 3.5 mmol/L (ref 3.5–5.1)
Sodium: 127 mmol/L — ABNORMAL LOW (ref 135–145)
Sodium: 131 mmol/L — ABNORMAL LOW (ref 135–145)

## 2020-02-13 LAB — MAGNESIUM: Magnesium: 2.3 mg/dL (ref 1.7–2.4)

## 2020-02-13 MED ORDER — POTASSIUM CHLORIDE CRYS ER 20 MEQ PO TBCR
40.0000 meq | EXTENDED_RELEASE_TABLET | Freq: Two times a day (BID) | ORAL | Status: DC
  Administered 2020-02-13 (×2): 40 meq via ORAL
  Filled 2020-02-13 (×2): qty 2

## 2020-02-13 MED ORDER — AZITHROMYCIN 250 MG PO TABS
500.0000 mg | ORAL_TABLET | Freq: Every day | ORAL | Status: DC
  Administered 2020-02-14 – 2020-02-16 (×3): 500 mg via ORAL
  Filled 2020-02-13 (×3): qty 2

## 2020-02-13 NOTE — Social Work (Signed)
Acknowledge consult for  SNF/HH awaiting PT is recommending no PT follow up.  Consult is being closed out at this time.

## 2020-02-13 NOTE — Progress Notes (Signed)
Occupational Therapy Evaluation Patient Details Name: KOLLEEN OCHSNER MRN: [id number] DOB: May 05, 1951 Today's Date: 02/13/2020    History of Present Illness Patient is a 69 yo female who presents for Acute hypoxic respiratory failure likely 2/2 pneumonia with underlying asthma and CHF   Clinical Impression   Prior to hospitalization, pt was living with her husband in a 1-level house with 5 steps to enter and railing on both sides. Pt was Independent with ADLs/IADLs, Independent with functional mobility within her home, and Mod I for functional mobility for further distances using rollator for support. Pt reports that her husband mostly drives. Pt admitted for above and limited by decreased activity tolerance, decreased balance, and decreased safety awareness. Today, pt received sitting on toilet attempting to have a BM. Pt's husband sitting at bedside. Pt requires supervision for all functional transfers and assistance managing lines. Pt performed perineal care, toilet transfer, LB dressing, and grooming at sink with supervision for safety. Pt ambulated to/from bathroom with supervision and no mobility device. [place] with 2L of O2 donned throughout entire evaluation, registering in the upper 90s. Pt would benefit from continued skilled OT services acutely to address activity tolerance, BUE HEP for strength, and ADLs/IADLs. Depending on how well pt progress, pt may benefit from tub transfer bench at home. Will continue to follow pt acutely as able.      Follow Up Recommendations  No OT follow up;Supervision - Intermittent (secondary to decreased SPO2)    Equipment Recommendations  Other (comment) (possibly tub transfer bench- decreased activity tolerance)    Recommendations for Other Services       Precautions / Restrictions Precautions Precaution Comments: watch O2 Restrictions Weight Bearing Restrictions: No      Mobility Bed Mobility Overal bed mobility: Modified Independent                 Transfers Overall transfer level: Modified independent Equipment used: None             General transfer comment: increased time to come to standing, no assist or cues required    Balance Overall balance assessment: Mild deficits observed, not formally tested                                         ADL either performed or assessed with clinical judgement   ADL Overall ADL's : Needs assistance/impaired Eating/Feeding: Independent;Sitting   Grooming: Wash/dry hands;Supervision/safety;Standing   Upper Body Bathing: Set up;Sitting;Standing   Lower Body Bathing: Min guard;Sit to/from stand   Upper Body Dressing : Set up;Sitting   Lower Body Dressing: Supervision/safety;Sitting/lateral leans;Sit to/from stand   Toilet Transfer: Supervision/safety;Ambulation;Regular Toilet;Grab bars   Toileting- Clothing Manipulation and Hygiene: Supervision/safety;Cueing for safety;Sitting/lateral lean;Sit to/from stand       Functional mobility during ADLs: Supervision/safety General ADL Comments: pt found sitting on toilet upon arrival; pt performs BADLs with supervision and requires assistance with line management     Vision Baseline Vision/History: Wears glasses Wears Glasses: Reading only Patient Visual Report: No change from baseline       Perception     Praxis      Pertinent Vitals/Pain Pain Assessment: No/denies pain     Hand Dominance Right   Extremity/Trunk Assessment Upper Extremity Assessment Upper Extremity Assessment: Overall WFL for tasks assessed   Lower Extremity Assessment Lower Extremity Assessment: Defer to PT evaluation   Cervical / Trunk Assessment Cervical /  Trunk Assessment: Normal   Communication Communication Communication: No difficulties   Cognition Arousal/Alertness: Awake/alert Behavior During Therapy: WFL for tasks assessed/performed Overall Cognitive Status: Within Functional Limits for tasks assessed                                      General Comments  husband at bedside; confirms being avail 24/7 to supervise pt at home    Exercises     Shoulder Instructions      Home Living Family/patient expects to be discharged to:: Private residence Living Arrangements: Spouse/significant other Available Help at Discharge: Family Type of Home: House Home Access: Stairs to enter Technical brewer of Steps: 5 Entrance Stairs-Rails: Can reach both [place]: One level     Bathroom Shower/Tub: Teacher, early years/pre: [place]: Grab bars - toilet;Grab bars - tub/shower          Prior Functioning/Environment Level of Independence: Independent        Comments: utilizes rollator for longer distances (e.g. grocery shopping)        OT Problem List: Decreased activity tolerance;Impaired balance (sitting and/or standing);Decreased safety awareness      OT Treatment/Interventions: Self-care/ADL training;Therapeutic exercise;Energy conservation;Therapeutic activities;Patient/family education    OT Goals(Current goals can be found in the care plan section) Acute Rehab OT Goals Patient Stated Goal: to return home  OT Frequency: Min 2X/week   Barriers to D/C:            Co-evaluation              AM-PAC OT "6 Clicks" Daily Activity     Outcome Measure Help from another person eating meals?: None Help from another person taking care of personal grooming?: None Help from another person toileting, which includes using toliet, bedpan, or urinal?: None Help from another person bathing (including washing, rinsing, drying)?: A Little Help from another person to put on and taking off regular upper body clothing?: None Help from another person to put on and taking off regular lower body clothing?: None 6 Click Score: 23   End of Session Equipment Utilized During Treatment: Gait belt;Oxygen Nurse Communication: Mobility  status  Activity Tolerance: Patient tolerated treatment well Patient left: in bed;with call bell/phone within reach  OT Visit Diagnosis: Unsteadiness on feet (R26.81);Muscle weakness (generalized) (M62.81)                Time: 8325-4982 OT Time Calculation (min): 14 min Charges:  OT General Charges $OT Visit: 1 Visit OT Evaluation $OT Eval Moderate Complexity: 1 Mod  Michel Bickers, OTR/L [facility] [phone]  Francesca Jewett 02/13/2020, 3:55 PM

## 2020-02-13 NOTE — Evaluation (Signed)
Physical Therapy Evaluation Patient Details Name: Erin Wong MRN: [id number] DOB: 1951/05/14 Today's Date: 02/13/2020   History of Present Illness  Patient is a 69 yo female who presents for Acute hypoxic respiratory failure likely 2/2 pneumonia with underlying asthma and CHF  Clinical Impression  Orders received for PT evaluation. Patient demonstrates deficits in functional mobility as indicated below. Will benefit from continued skilled PT to address deficits and maximize function. Will see as indicated and progress as tolerated.  Of note: patient with increased DOE 4/4 with 56ft ambulation on room air, saturations to 88%, did not rebound, required 2 liters [place] to rebound above 90% with rest.    Follow Up Recommendations No PT follow up    Equipment Recommendations  None recommended by PT    Recommendations for Other Services       Precautions / Restrictions Precautions Precaution Comments: watch O2 Restrictions Weight Bearing Restrictions: No      Mobility  Bed Mobility Overal bed mobility: Modified Independent                Transfers Overall transfer level: Modified independent Equipment used: None             General transfer comment: increased time to come to standing, no assist or cues required  Ambulation/Gait Ambulation/Gait assistance: Supervision;Min guard Gait Distance (Feet): 80 Feet Assistive device: IV Pole;None Gait Pattern/deviations: Step-through pattern;Decreased step length - right;Decreased stance time - right;Wide base of support Gait velocity: decreased Gait velocity interpretation: <1.31 ft/sec, indicative of household ambulator General Gait Details: altered gait pattern at baseline with RLE deviation and circumduction. increased SOB with mobility DOE 4/4 with distance and 1 standing rest break with PLB. Saturations to 88% HR stable  Stairs            Wheelchair Mobility    Modified Rankin (Stroke Patients Only)        Balance Overall balance assessment: Mild deficits observed, not formally tested                                           Pertinent Vitals/Pain Pain Assessment: No/denies pain    Home Living Family/patient expects to be discharged to:: Private residence Living Arrangements: Spouse/significant other Available Help at Discharge: Family Type of Home: House Home Access: Stairs to enter Entrance Stairs-Rails: Can reach both Entrance Stairs-Number of Steps: 5 Home Layout: One level Home Equipment: None      Prior Function Level of Independence: Independent         Comments: does not use assistive device at home     Hand Dominance   Dominant Hand: Right    Extremity/Trunk Assessment   Upper Extremity Assessment Upper Extremity Assessment: Overall WFL for tasks assessed    Lower Extremity Assessment Lower Extremity Assessment: Overall WFL for tasks assessed (noted RLE deformity with altered gait at baseline)       Communication      Cognition Arousal/Alertness: Awake/alert Behavior During Therapy: WFL for tasks assessed/performed Overall Cognitive Status: Within Functional Limits for tasks assessed                                        General Comments      Exercises     Assessment/Plan    PT Assessment Patient  needs continued PT services  PT Problem List Decreased strength;Decreased activity tolerance;Decreased mobility;Cardiopulmonary status limiting activity       PT Treatment Interventions Gait training;Stair training;Functional mobility training;Therapeutic activities;Therapeutic exercise;Patient/family education    PT Goals (Current goals can be found in the Care Plan section)  Acute Rehab PT Goals Patient Stated Goal: to breath better PT Goal Formulation: With patient Time For Goal Achievement: 02/27/20 Potential to Achieve Goals: Good    Frequency Min 3X/week   Barriers to discharge         Co-evaluation               AM-PAC PT "6 Clicks" Mobility  Outcome Measure Help needed turning from your back to your side while in a flat bed without using bedrails?: None Help needed moving from lying on your back to sitting on the side of a flat bed without using bedrails?: None Help needed moving to and from a bed to a chair (including a wheelchair)?: A Little Help needed standing up from a chair using your arms (e.g., wheelchair or bedside chair)?: A Little Help needed to walk in hospital room?: A Little Help needed climbing 3-5 steps with a railing? : A Lot 6 Click Score: 19    End of Session Equipment Utilized During Treatment: Oxygen Activity Tolerance: Patient limited by fatigue Patient left: in bed;with call bell/phone within reach;with family/visitor present (sitting EOB with breakfast) Nurse Communication: Mobility status PT Visit Diagnosis: Difficulty in walking, not elsewhere classified (R26.2)    Time: 0950-1009 PT Time Calculation (min) (ACUTE ONLY): 19 min   Charges:   PT Evaluation $PT Eval Moderate Complexity: [place], PT DPT  Board Certified Neurologic Specialist Acute Rehabilitation Services Office [place] 02/13/2020, 10:13 AM

## 2020-02-13 NOTE — Progress Notes (Signed)
PROGRESS NOTE  Erin Wong MGQ:[id number] DOB: 1951/03/12 DOA: 02/11/2020 PCP: [name], MD  HPI/Recap of past 24 hours: HPI from Dr Guy Sandifer is a 69 y.o. female with history of A. fib recently diagnosed cardiomyopathy admitted last month for A. fib with RVR with CHF and was placed on amiodarone and Eliquis at that time. Pt has been having productive cough of yellowish sputum and shortness of breath for the last 1 week had gone to her PCP about 2 days ago and was advised symptomatic treatment.  Patient states that over the last 2 days patient has become more acutely SOB, with some lower extremity edema. In the ED, patient was noted to be in A. fib with RVR was started on Cardizem drip.  CT angiogram of the chest done shows features concerning for bronchiolitis viral versus bacterial with some consolidation in the right upper and lower lobes. Covid test is negative.  Labs show sodium 126 LFTs unremarkable WBC count is elevated at 17.2. Pt admitted for further management.    Today, patient still complaining of SOB, cough, denies any chest pain, palpitations, fever/chills, N/V, diarrhea. Wants her husband to stay overnight with her as he is her support system and "he helps me calm down".     Assessment/Plan: Principal Problem:   Acute respiratory failure with hypoxia (HCC) Active Problems:   Asthma   CKD (chronic kidney disease) stage 3, GFR 30-59 ml/min   Benign essential HTN   Atrial fibrillation with rapid ventricular response (HCC)   Acute systolic heart failure (HCC)   Acute bronchitis  Acute hypoxic respiratory failure likely 2/2 pneumonia with underlying asthma and CHF In the ED, drop sats to the 80s Sats currently above 90% on 2L of oxygen, plan to wean off Currently afebrile with leukocytosis (on steroids) CTA chest concerning for possible viral bronchiolitis versus bacterial with some consolidation in the right upper and lower lobes Continue  azithromycin, ceftriaxone (will escalate to cover for HCAP if no sig improvement or patient deteriorates) Continue supplemental oxygen, duonebs, inhalers, steroids Monitor closely  Paroxysmal A. fib with RVR HR fluctuating Failed DCCV started on amiodarone Status post diltiazem drip, discontinued due to low EF Cardiology on board, continue amiodarone, increase Coreg to 25 twice daily, continue Eliquis, plan for a repeat DCCV on 6/14 pending respiratory improvement/schedule Telemetry  Acute on chronic systolic HF/cardiomyopathy possibly tachy-mediated BNP elevated TEE done 01/2020 showed EF of 30 to 35% with global hypokinesis, moderate to severe MR and TR Continue Lasix, Coreg, Entresto Strict I's and O's, daily weights  Hyponatremia/hypomagnesemia Replace prn Daily BMP  Asthma Management as above  HLD Continue statins          Malnutrition Type:      Malnutrition Characteristics:      Nutrition Interventions:       Estimated body mass index is 25.97 kg/m as calculated from the following:   Height as of 01/26/20: 5\' 4"  (1.626 m).   Weight as of this encounter: 68.6 kg.     Code Status: Full  Family Communication: Spoke to husband at bedside on 02/12/20  Disposition Plan: Status is: Inpatient  Remains inpatient appropriate because:Inpatient level of care appropriate due to severity of illness   Dispo: The patient is from: Home              Anticipated d/c is to: Home              Anticipated d/c date is: 3 days  Patient currently is not medically stable to d/c.    Consultants:  Cardiology  Procedures:  None  Antimicrobials:  Ceftriaxone  Azithromycin  DVT prophylaxis: Eliquis   Objective: Vitals:   02/12/20 2123 02/13/20 0535 02/13/20 0846 02/13/20 0849  BP: 102/78 109/68  (!) 123/104  Pulse: 89 92  (!) 109  Resp: 19 19    Temp: (!) 97.5 F (36.4 C) 98 F (36.7 C)    TempSrc: Oral Oral    SpO2: 94% 97% 92% 98%    Weight:  68.6 kg      Intake/Output Summary (Last 24 hours) at 02/13/2020 1232 Last data filed at 02/13/2020 0849 Gross per 24 hour  Intake 989.05 ml  Output --  Net 989.05 ml   Filed Weights   02/13/20 0535  Weight: 68.6 kg    Exam:  General: NAD   Cardiovascular: S1, S2 present  Respiratory: Diminished BS bilaterally  Abdomen: Soft, nontender, nondistended, bowel sounds present  Musculoskeletal: Trace bilateral pedal edema noted  Skin: Normal  Psychiatry: Normal mood    Data Reviewed: CBC: Recent Labs  Lab 02/11/20 1703 02/13/20 0644  WBC 17.2* 15.1*  NEUTROABS  --  12.0*  HGB 13.0 12.3  HCT 40.0 36.4  MCV 97.3 95.5  PLT 388 263   Basic Metabolic Panel: Recent Labs  Lab 02/11/20 1703 02/12/20 1007 02/13/20 0644 02/13/20 1103  NA 126*  --  127* 131*  K 3.6  --  3.2* 3.5  CL 92*  --  92* 97*  CO2 23  --  23 22  GLUCOSE 128*  --  133* 162*  BUN 10  --  11 11  CREATININE 0.93  --  0.82 0.90  CALCIUM 8.5*  --  7.8* 8.2*  MG  --  1.3* 2.3  --    GFR: Estimated Creatinine Clearance: 56.2 mL/min (by C-G formula based on SCr of 0.9 mg/dL). Liver Function Tests: Recent Labs  Lab 02/12/20 0030  AST 31  ALT 33  ALKPHOS 59  BILITOT 0.6  PROT 5.7*  ALBUMIN 3.1*   Recent Labs  Lab 02/11/20 1703  LIPASE 20   No results for input(s): AMMONIA in the last 168 hours. Coagulation Profile: No results for input(s): INR, PROTIME in the last 168 hours. Cardiac Enzymes: No results for input(s): CKTOTAL, CKMB, CKMBINDEX, TROPONINI in the last 168 hours. BNP (last 3 results) No results for input(s): PROBNP in the last 8760 hours. HbA1C: No results for input(s): HGBA1C in the last 72 hours. CBG: No results for input(s): GLUCAP in the last 168 hours. Lipid Profile: No results for input(s): CHOL, HDL, LDLCALC, TRIG, CHOLHDL, LDLDIRECT in the last 72 hours. Thyroid Function Tests: No results for input(s): TSH, T4TOTAL, FREET4, T3FREE, THYROIDAB in the  last 72 hours. Anemia Panel: No results for input(s): VITAMINB12, FOLATE, FERRITIN, TIBC, IRON, RETICCTPCT in the last 72 hours. Urine analysis:    Component Value Date/Time   COLORURINE STRAW (A) 01/13/2017 2028   APPEARANCEUR CLEAR 01/13/2017 2028   LABSPEC 1.012 01/13/2017 2028   PHURINE 5.0 01/13/2017 2028   GLUCOSEU NEGATIVE 01/13/2017 2028   HGBUR NEGATIVE 01/13/2017 2028   BILIRUBINUR NEGATIVE 01/13/2017 2028   KETONESUR 5 (A) 01/13/2017 2028   PROTEINUR NEGATIVE 01/13/2017 2028   NITRITE NEGATIVE 01/13/2017 2028   LEUKOCYTESUR NEGATIVE 01/13/2017 2028   Sepsis Labs: @LABRCNTIP (procalcitonin:4,lacticidven:4)  ) Recent Results (from the past 240 hour(s))  SARS-COV-2 RNA,(COVID-19) QUAL NAAT     Status: None   Collection Time: 02/10/20  9:34 AM   Specimen: Respiratory  Result Value Ref Range Status   SARS CoV2 RNA NOT DETECTED NOT DETECT Final    Comment: . A Not Detected (negative) test result for this test means that SARS-CoV-2 RNA was not present in the specimen above the limit of detection. A negative result does not rule out the possibility of COVID-19 and should not be used as the sole basis for treatment or patient management  decisions.  If COVID-19 is still suspected, based on  exposure history together with other clinical findings, re-testing should be considered in consultation with public health authorities. Laboratory test results should always be considered in the context of clinical  observations and epidemiological data in making a final diagnosis and patient management decisions. . This patient specimen was tested using an FDA EUA pooling method. . Patient specimens with low viral loads may not be detected in sample pools due to the decreased sensitivity of  pooled testing. . Please review the "Fact Sheets" and FDA authorized labeling available for health care providers and patients using the foll owing  websites: https://[email or web address]/fact-[email or web address]  [email or web address]/ fact-sheet2 . This test has been authorized by the FDA under an  Emergency Use Authorization (EUA) for use by authorized laboratories. . Due to the current public health emergency, [facility] is receiving a high volume of samples from a wide variety of swabs and media for COVID-19 testing. In order to serve patients during this public health crisis, samples from appropriate clinical sources are  being tested. Negative test results derived from specimens received in non-commercially manufactured viral collection and transport media, or in media and sample collection kits not yet authorized by FDA for COVID-19 testing should be cautiously evaluated and the patient potentially subjected to extra precautions such as additional clinical monitoring, including collection of an additional specimen. . Methodology:  Nucle ic Acid Amplification Test (NAAT) includes RT-PCR or TMA . Additional information about COVID-19 can be found at the [employer] website: [email or web address].   SARS Coronavirus 2 by RT PCR (hospital order, performed in [facility] hospital lab) Nasopharyngeal Nasopharyngeal Swab     Status: None   Collection Time: 02/12/20  1:08 AM   Specimen: Nasopharyngeal Swab  Result Value Ref Range Status   SARS Coronavirus 2 NEGATIVE NEGATIVE Final    Comment: (NOTE) SARS-CoV-2 target nucleic acids are NOT DETECTED.  The SARS-CoV-2 RNA is generally detectable in upper and lower respiratory specimens during the acute phase of infection. The lowest concentration of SARS-CoV-2 viral copies this assay can detect is 250 copies / mL. A negative result does not preclude SARS-CoV-2 infection and should not be used as the sole basis for treatment or other patient management decisions.  A negative result may occur  with improper specimen collection / handling, submission of specimen other than nasopharyngeal swab, presence of viral mutation(s) within the areas targeted by this assay, and inadequate number of viral copies (<250 copies / mL). A negative result must be combined with clinical observations, patient history, and epidemiological information.  Fact Sheet for Patients:   StrictlyIdeas.no  Fact Sheet for Healthcare Providers: BankingDealers.co.za  This test is not yet approved or  cleared by the [place] FDA and has been authorized for detection and/or diagnosis of SARS-CoV-2 by FDA under an Emergency Use Authorization (EUA).  This EUA will remain in effect (meaning this test can be used) for the duration of the COVID-19 declaration under Section 564(b)(1) of the Act, 21 U.S.C. section 360bbb-3(b)(1), unless the authorization is  terminated or revoked sooner.  Performed at [facility], [place] [address] [place], [place] [address]   Culture, sputum-assessment     Status: None   Collection Time: 02/12/20 10:11 AM   Specimen: Expectorated Sputum  Result Value Ref Range Status   Specimen Description Expect. Sput  Final   Special Requests NONE  Final   Sputum evaluation   Final    THIS SPECIMEN IS ACCEPTABLE FOR SPUTUM CULTURE Performed at [facility], [address] [place], [place] [address]    Report Status 02/12/2020 FINAL  Final  Culture, respiratory     Status: None (Preliminary result)   Collection Time: 02/12/20 10:11 AM  Result Value Ref Range Status   Specimen Description Expect. Sput  Final   Special Requests NONE Reflexed from [id number]  Final   Gram Stain   Final    RARE WBC PRESENT,BOTH PMN AND MONONUCLEAR RARE GRAM POSITIVE COCCI IN PAIRS RARE GRAM POSITIVE RODS RARE GRAM NEGATIVE RODS    Culture   Final    CULTURE REINCUBATED FOR BETTER GROWTH Performed at [facility], [place] [address]  [place], [place] [address]    Report Status PENDING  Incomplete  C Difficile Quick Screen w PCR reflex     Status: None   Collection Time: 02/12/20  2:43 PM   Specimen: STOOL  Result Value Ref Range Status   C Diff antigen NEGATIVE NEGATIVE Final   C Diff toxin NEGATIVE NEGATIVE Final   C Diff interpretation No C. difficile detected.  Final    Comment: Performed at [facility], [place] [address], [place], [place] [address]      Studies: No results found.  Scheduled Meds: . amiodarone  200 mg Oral Daily  . apixaban  5 mg Oral BID  . atorvastatin  40 mg Oral Daily  . [START ON 02/14/2020] azithromycin  500 mg Oral Daily  . calcium carbonate  0.5 tablet Oral Daily  . carvedilol  25 mg Oral BID WC  . dextromethorphan-guaiFENesin  1 tablet Oral BID  . fluticasone  2 spray Each Nare Daily  . furosemide  20 mg Intravenous Daily  . levalbuterol  0.63 mg Nebulization Q6H  . methylPREDNISolone (SOLU-MEDROL) injection  40 mg Intravenous Daily  . mometasone-formoterol  2 puff Inhalation BID  . pantoprazole  40 mg Oral Daily  . polyethylene glycol  17 g Oral Daily  . potassium chloride  40 mEq Oral BID  . sacubitril-valsartan  1 tablet Oral BID    Continuous Infusions: . cefTRIAXone (ROCEPHIN)  IV 2 g (02/12/20 2305)     LOS: 1 day     Alma Friendly, MD [facility]  If 7PM-7AM, please contact night-coverage [email or web address] 02/13/2020, 12:32 PM

## 2020-02-13 NOTE — Progress Notes (Signed)
Subjective:  No palpitations, chest pain Dyspnea and cough better   Objective:  Vitals:   02/12/20 2123 02/13/20 0535 02/13/20 0846 02/13/20 0849  BP: 102/78 109/68  (!) 123/104  Pulse: 89 92  (!) 109  Resp: 19 19    Temp: (!) 97.5 F (36.4 C) 98 F (36.7 C)    TempSrc: Oral Oral    SpO2: 94% 97% 92% 98%  Weight:  68.6 kg      Intake/Output from previous day:  Intake/Output Summary (Last 24 hours) at 02/13/2020 1027 Last data filed at 02/13/2020 6761 Gross per 24 hour  Intake 869.05 ml  Output --  Net 869.05 ml    Physical Exam: Affect appropriate Elderly white female  HEENT: normal Neck supple with no adenopathy JVP normal no bruits no thyromegaly Lungs clear with no wheezing and good diaphragmatic motion Heart:  S1/S2 MR  murmur, no rub, gallop or click PMI normal Abdomen: benighn, BS positve, no tenderness, no AAA no bruit.  No HSM or HJR Distal pulses intact with no bruits No edema Neuro non-focal Skin warm and dry No muscular weakness   Lab Results: Basic Metabolic Panel: Recent Labs    02/11/20 1703 02/12/20 1007 02/13/20 0644  NA 126*  --  127*  K 3.6  --  3.2*  CL 92*  --  92*  CO2 23  --  23  GLUCOSE 128*  --  133*  BUN 10  --  11  CREATININE 0.93  --  0.82  CALCIUM 8.5*  --  7.8*  MG  --  1.3* 2.3   Liver Function Tests: Recent Labs    02/12/20 0030  AST 31  ALT 33  ALKPHOS 59  BILITOT 0.6  PROT 5.7*  ALBUMIN 3.1*   Recent Labs    02/11/20 1703  LIPASE 20   CBC: Recent Labs    02/11/20 1703 02/13/20 0644  WBC 17.2* 15.1*  NEUTROABS  --  12.0*  HGB 13.0 12.3  HCT 40.0 36.4  MCV 97.3 95.5  PLT 388 330    Imaging: DG Chest 2 View  Result Date: 02/11/2020 CLINICAL DATA:  Shortness of breath and productive cough for 2 days. EXAM: CHEST - 2 VIEW COMPARISON:  01/13/2020, 01/13/2017 FINDINGS: Mild cardiomegaly, unchanged from prior allowing for differences in technique. No pulmonary edema. Chronic left basilar  pleuroparenchymal scarring. Mild biapical pleuroparenchymal scarring. No acute airspace disease. No pneumothorax. No significant pleural fluid or confluent airspace disease. Osseous structures are intact. IMPRESSION: 1. No acute abnormality. 2. Stable mild cardiomegaly and left basilar pleuroparenchymal scarring. Electronically Signed   By: Keith Rake M.D.   On: 02/11/2020 18:21   CT Angio Chest PE W/Cm &/Or Wo Cm  Result Date: 02/12/2020 CLINICAL DATA:  Shortness of breath. EXAM: CT ANGIOGRAPHY CHEST WITH CONTRAST TECHNIQUE: Multidetector CT imaging of the chest was performed using the standard protocol during bolus administration of intravenous contrast. Multiplanar CT image reconstructions and MIPs were obtained to evaluate the vascular anatomy. CONTRAST:  167mL OMNIPAQUE IOHEXOL 350 MG/ML SOLN COMPARISON:  None. FINDINGS: Cardiovascular: Contrast injection is sufficient to demonstrate satisfactory opacification of the pulmonary arteries to the segmental level. There is no pulmonary embolus or evidence of right heart strain. The size of the main pulmonary artery is normal. Heart size is normal, with no pericardial effusion. There are coronary artery calcifications. There are atherosclerotic changes of the thoracic aorta without evidence for an aneurysm there is significant reflux of contrast into the IVC.  Mediastinum/Nodes: -- No mediastinal lymphadenopathy. -- No hilar lymphadenopathy. -- No axillary lymphadenopathy. -- No supraclavicular lymphadenopathy. -- Normal thyroid gland where visualized. -  Unremarkable esophagus. Lungs/Pleura: There are emphysematous changes. There is consolidation in the right upper lobe and left lower lobe with areas of bronchial wall thickening and mucus plugging. There is no pneumothorax. There is no significant pleural effusion. Upper Abdomen: Contrast bolus timing is not optimized for evaluation of the abdominal organs. The visualized portions of the organs of the upper  abdomen are normal. Musculoskeletal: The patient is status post prior splenectomy. There are few small splenules in left upper quadrant. There is an apparent large ventral wall hernia containing fat. However, this hernia is only partially visualized. Review of the MIP images confirms the above findings. IMPRESSION: 1. No acute pulmonary embolism. 2. Findings concerning for infectious bronchiolitis (viral or bacterial) with areas of consolidation involving the right upper and left lower lobes. 3. Significant reflux of contrast into the IVC, suggestive of right heart dysfunction. Aortic Atherosclerosis (ICD10-I70.0) and Emphysema (ICD10-J43.9). Electronically Signed   By: Constance Holster M.D.   On: 02/12/2020 01:43    Cardiac Studies:  ECG:    Telemetry: afib rates 90-100 bpm   Echo: TEE 5/17 no LAA thrombus EF 30-35% moderate to severe MR Moderate LAE   Medications:   . amiodarone  200 mg Oral Daily  . apixaban  5 mg Oral BID  . atorvastatin  40 mg Oral Daily  . calcium carbonate  0.5 tablet Oral Daily  . carvedilol  25 mg Oral BID WC  . dextromethorphan-guaiFENesin  1 tablet Oral BID  . fluticasone  2 spray Each Nare Daily  . furosemide  20 mg Intravenous Daily  . levalbuterol  0.63 mg Nebulization Q6H  . methylPREDNISolone (SOLU-MEDROL) injection  40 mg Intravenous Daily  . mometasone-formoterol  2 puff Inhalation BID  . pantoprazole  40 mg Oral Daily  . polyethylene glycol  17 g Oral Daily  . sacubitril-valsartan  1 tablet Oral BID     . azithromycin 500 mg (02/13/20 0333)  . cefTRIAXone (ROCEPHIN)  IV 2 g (02/12/20 2305)    Assessment/Plan:   1. Afib:  Rates ok has been loaded with amiodarone since TEE 5/17 No LAA thrombus From [facility] : [facility] she did receive 2 doses of eliquis on 12 th ? Only one dose Friday I think she is ok for [facility] with no TEE especially given pneumonia and respiratory status. Continue amiodarone Cardizem d/c due to low EF and coreg increased to 25 bid. Possible [place]  with Dr Gus Height tomorrow if schedule permits Orders written and PA notified to add on schedule Supplement K   2. CHF:  EF 30-35% with moderate to severe MR while in rapid afib continue lasix , entresto and coreg compensated will need ischemic evaluation after NSR restored   3. Pneumonia:  RUL improved symptoms will write for CXR in am CT with no PE continue Azithromycin and rocephin WBC down also on steroids   Jenkins Rouge 02/13/2020, 10:27 AM

## 2020-02-14 ENCOUNTER — Telehealth: Payer: Self-pay | Admitting: Family Medicine

## 2020-02-14 ENCOUNTER — Ambulatory Visit: Payer: Medicare Other | Admitting: Nurse Practitioner

## 2020-02-14 ENCOUNTER — Inpatient Hospital Stay (HOSPITAL_COMMUNITY): Payer: Medicare Other

## 2020-02-14 DIAGNOSIS — R195 Other fecal abnormalities: Secondary | ICD-10-CM

## 2020-02-14 DIAGNOSIS — I5043 Acute on chronic combined systolic (congestive) and diastolic (congestive) heart failure: Secondary | ICD-10-CM

## 2020-02-14 DIAGNOSIS — I429 Cardiomyopathy, unspecified: Secondary | ICD-10-CM

## 2020-02-14 DIAGNOSIS — Z9189 Other specified personal risk factors, not elsewhere classified: Secondary | ICD-10-CM

## 2020-02-14 LAB — MAGNESIUM: Magnesium: 2.1 mg/dL (ref 1.7–2.4)

## 2020-02-14 LAB — CBC WITH DIFFERENTIAL/PLATELET
Abs Immature Granulocytes: 0.07 10*3/uL (ref 0.00–0.07)
Basophils Absolute: 0 10*3/uL (ref 0.0–0.1)
Basophils Relative: 0 %
Eosinophils Absolute: 0 10*3/uL (ref 0.0–0.5)
Eosinophils Relative: 0 %
HCT: 37.3 % (ref 36.0–46.0)
Hemoglobin: 12.2 g/dL (ref 12.0–15.0)
Immature Granulocytes: 1 %
Lymphocytes Relative: 10 %
Lymphs Abs: 1.5 10*3/uL (ref 0.7–4.0)
MCH: 31.8 pg (ref 26.0–34.0)
MCHC: 32.7 g/dL (ref 30.0–36.0)
MCV: 97.1 fL (ref 80.0–100.0)
Monocytes Absolute: 1.2 10*3/uL — ABNORMAL HIGH (ref 0.1–1.0)
Monocytes Relative: 8 %
Neutro Abs: 12.5 10*3/uL — ABNORMAL HIGH (ref 1.7–7.7)
Neutrophils Relative %: 81 %
Platelets: 349 10*3/uL (ref 150–400)
RBC: 3.84 MIL/uL — ABNORMAL LOW (ref 3.87–5.11)
RDW: 13.9 % (ref 11.5–15.5)
WBC: 15.3 10*3/uL — ABNORMAL HIGH (ref 4.0–10.5)
nRBC: 0 % (ref 0.0–0.2)

## 2020-02-14 LAB — BASIC METABOLIC PANEL
Anion gap: 12 (ref 5–15)
BUN: 13 mg/dL (ref 8–23)
CO2: 24 mmol/L (ref 22–32)
Calcium: 8.2 mg/dL — ABNORMAL LOW (ref 8.9–10.3)
Chloride: 97 mmol/L — ABNORMAL LOW (ref 98–111)
Creatinine, Ser: 0.88 mg/dL (ref 0.44–1.00)
GFR calc Af Amer: >60 mL/min (ref >60)
GFR calc non Af Amer: >60 mL/min (ref >60)
Glucose, Bld: 123 mg/dL — ABNORMAL HIGH (ref 70–99)
Potassium: 4.7 mmol/L (ref 3.5–5.1)
Sodium: 133 mmol/L — ABNORMAL LOW (ref 135–145)

## 2020-02-14 LAB — CULTURE, RESPIRATORY W GRAM STAIN: Culture: NORMAL

## 2020-02-14 MED ORDER — AMIODARONE HCL 200 MG PO TABS
200.0000 mg | ORAL_TABLET | Freq: Two times a day (BID) | ORAL | Status: DC
  Administered 2020-02-14 – 2020-02-16 (×5): 200 mg via ORAL
  Filled 2020-02-14 (×5): qty 1

## 2020-02-14 NOTE — Telephone Encounter (Signed)
Please consult gi.

## 2020-02-14 NOTE — Progress Notes (Addendum)
Patient has had high heart rates in atrial fibrillation. Spoke to [place] and physician did not want to change anything at that time. Blood pressures have been low. Patient up to the bathroom with heart rate up to 140's, asymptomatic. Will monitor to ensure heart rate decreases when at rest and give scheduled coreg.

## 2020-02-14 NOTE — Telephone Encounter (Signed)
Received patient's cologuard results back from [employer], cologuard results were positive. Ok to refer to GI?

## 2020-02-14 NOTE — Progress Notes (Signed)
Patient reported having a bloody nose. Assessed and patient had 2 tissues with white blood tinged nasal discharge she had. O2 at 2 liters. Discontinued oxygen and will monitor O2sats. If oxygen needed will restart with humified oxygen. O2 sats 93-95% at this time. Patients cardiac rhythm is increasing but blood pressures have been low. Coreg given at 555 this morning for tachy rhythm. Atrial fibrillation 90's to 120's at this time with last blood pressure at 1528 103/72 with a MAP of 82. Will notify Cadence Furth PA of above information.

## 2020-02-14 NOTE — Progress Notes (Signed)
Progress Note  Patient Name: Erin Wong Date of Encounter: 02/14/2020  [facility] Cardiologist: Jenkins Rouge, MD   Subjective   Slept well overnight, sleeping flat on my arrival. Continues to cough up phlegm. We discussed options for procedures for afib, see below. No chest pain, breathing slightly improved.  Inpatient Medications    Scheduled Meds: . amiodarone  200 mg Oral BID  . apixaban  5 mg Oral BID  . atorvastatin  40 mg Oral Daily  . azithromycin  500 mg Oral Daily  . calcium carbonate  0.5 tablet Oral Daily  . carvedilol  25 mg Oral BID WC  . dextromethorphan-guaiFENesin  1 tablet Oral BID  . fluticasone  2 spray Each Nare Daily  . furosemide  20 mg Intravenous Daily  . levalbuterol  0.63 mg Nebulization Q6H  . methylPREDNISolone (SOLU-MEDROL) injection  40 mg Intravenous Daily  . mometasone-formoterol  2 puff Inhalation BID  . pantoprazole  40 mg Oral Daily  . polyethylene glycol  17 g Oral Daily  . sacubitril-valsartan  1 tablet Oral BID   Continuous Infusions: . cefTRIAXone (ROCEPHIN)  IV 2 g (02/14/20 0045)   PRN Meds: benzonatate, traMADol   Vital Signs    Vitals:   02/14/20 0551 02/14/20 0643 02/14/20 0902 02/14/20 0908  BP:      Pulse: (!) 111 91    Resp:      Temp:      TempSrc:      SpO2: 96% 97% 96% 96%  Weight:      Height:        Intake/Output Summary (Last 24 hours) at 02/14/2020 0925 Last data filed at 02/14/2020 0300 Gross per 24 hour  Intake 240 ml  Output --  Net 240 ml   Last 3 Weights 02/14/2020 02/13/2020 02/10/2020  Weight (lbs) 151 lb 4.8 oz 151 lb 4.8 oz 152 lb  Weight (kg) 68.629 kg 68.629 kg 68.947 kg      Telemetry    Atrial fibrillation, rates 80s-110s - Personally Reviewed  ECG    02/14/20  Atrial fibrillation with RVR at 111 bpm- Personally Reviewed  Physical Exam   GEN: Well nourished, well developed in no acute distress. Nasal cannula inplace HEENT: Normal, moist mucous membranes NECK: No JVD  visible CARDIAC: tachycardic, irregularly irregular rhythm, normal S1 and S2, no rubs or gallops. 3/6 systolic murmur. VASCULAR: Radial and DP pulses 2+ bilaterally. No carotid bruits RESPIRATORY:  Coarse but no appreciated wheezing or crackles ABDOMEN: Soft, non-tender, non-distended MUSCULOSKELETAL:  Ambulates independently SKIN: Warm and dry, no edema NEUROLOGIC:  Alert and oriented x 3. No focal neuro deficits noted. PSYCHIATRIC:  Normal affect   Labs    High Sensitivity Troponin:   Recent Labs  Lab 02/12/20 0030 02/12/20 0502  TROPONINIHS 12 17      Chemistry Recent Labs  Lab 02/11/20 1703 02/12/20 0030 02/13/20 0644 02/13/20 1103 02/14/20 0449  NA   < >  --  127* 131* 133*  K   < >  --  3.2* 3.5 4.7  CL   < >  --  92* 97* 97*  CO2   < >  --  23 22 24   GLUCOSE   < >  --  133* 162* 123*  BUN   < >  --  11 11 13   CREATININE   < >  --  0.82 0.90 0.88  CALCIUM   < >  --  7.8* 8.2* 8.2*  PROT  --  5.7*  --   --   --   ALBUMIN  --  3.1*  --   --   --   AST  --  31  --   --   --   ALT  --  33  --   --   --   ALKPHOS  --  59  --   --   --   BILITOT  --  0.6  --   --   --   GFRNONAA   < >  --  >60 >60 >60  GFRAA   < >  --  >60 >60 >60  ANIONGAP   < >  --  12 12 12    < > = values in this interval not displayed.     Hematology Recent Labs  Lab 02/11/20 1703 02/13/20 0644 02/14/20 0449  WBC 17.2* 15.1* 15.3*  RBC 4.11 3.81* 3.84*  HGB 13.0 12.3 12.2  HCT 40.0 36.4 37.3  MCV 97.3 95.5 97.1  MCH 31.6 32.3 31.8  MCHC 32.5 33.8 32.7  RDW 13.5 13.5 13.9  PLT 388 330 349    BNP Recent Labs  Lab 02/12/20 0030  BNP 602.0*     DDimer No results for input(s): DDIMER in the last 168 hours.   Radiology    DG CHEST PORT 1 VIEW  Result Date: 02/14/2020 CLINICAL DATA:  Shortness of breath EXAM: PORTABLE CHEST 1 VIEW COMPARISON:  February 11, 2020 FINDINGS: There is airspace opacity in the right upper lobe, new. There is atelectatic change in the left base with  minimal left pleural effusion. Heart is borderline enlarged with pulmonary vascularity normal. No adenopathy. No bone lesions. IMPRESSION: Airspace opacity consistent with pneumonia right upper lobe. Stable atelectasis base with minimal left pleural effusion. Borderline cardiomegaly. No adenopathy. Electronically Signed   By: Lowella Grip III M.D.   On: 02/14/2020 08:18    Cardiac Studies   Echo 01/14/20 and TEE 01/17/20 personally reviewed  Patient Profile     69 y.o. 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  Assessment & Plan    Atrial fibrillation with RVR: -increasing back to home amiodarone dose of 200 mg BID -on carvedilol 25 mg BID -no diltiazem given low EF -Aim for resting HR <90 or <110 with exertion. Likely driven by pneumonia -she has some fatigue but no palpitations -we discussed options for management today. I am uncomfortable with cardioversion alone (recommended over the weekend by Dr. Johnsie Cancel) given missed apixaban, despite normal TEE last month. She is also uncomfortable with this plan. We discussed TEE-CV as an alternate, discussed risk of respiratory complications with her pneumonia. She overall does not feel poorly in her afib, and she is well perfused. After shared decision making, we will continue medical management and allow her to heal from her pneumonia. Can then consider cardioversion alone as outpatient -continue apixaban -if rate remain elevated, will consider digoxin -K 4.7 today, Cr 0.88. Monitor K closely, will D/C standing potassium orders, supplement PRN while admitted  Cardiomyopathy, acute on chronic systolic and diastolic heart failure -continue carvedilol,  entresto, furosemide -plan had been for repeat echo once in NSR, if EF still reduced then planned for cath -I/O not well charted this admission  Hypertension: normal to borderline low this admission  Hyperlipidemia: -continue atorvastatin 40 mg daily  Pneumonia vs bronchitis: -per primary team -on azithromycin, ceftriaxone, and steroids  Multiple QT prolonging medications -QT stable today on ECG -would check daily in the hospital while on azithromycin and amiodarone. I discontinued zofran  For questions or updates, please contact [facility] Please consult [email or web address] for contact info under     Signed, Buford Dresser, MD  02/14/2020, 9:25 AM

## 2020-02-14 NOTE — Plan of Care (Signed)
  Problem: Education: Goal: Ability to demonstrate management of disease process will improve Outcome: Progressing Goal: Ability to verbalize understanding of medication therapies will improve Outcome: Progressing   Problem: Activity: Goal: Capacity to carry out activities will improve Outcome: Progressing   Problem: Cardiac: Goal: Ability to achieve and maintain adequate cardiopulmonary perfusion will improve Outcome: Progressing   

## 2020-02-14 NOTE — Progress Notes (Signed)
PROGRESS NOTE  Erin Wong IWL:[id number] DOB: 1951-03-20 DOA: 02/11/2020 PCP: [name], MD  HPI/Recap of past 24 hours: HPI from Dr Guy Sandifer is a 69 y.o. female with history of A. fib recently diagnosed cardiomyopathy admitted last month for A. fib with RVR with CHF and was placed on amiodarone and Eliquis at that time. Pt has been having productive cough of yellowish sputum and shortness of breath for the last 1 week had gone to her PCP about 2 days ago and was advised symptomatic treatment.  Patient states that over the last 2 days patient has become more acutely SOB, with some lower extremity edema. In the ED, patient was noted to be in A. fib with RVR was started on Cardizem drip.  CT angiogram of the chest done shows features concerning for bronchiolitis viral versus bacterial with some consolidation in the right upper and lower lobes. Covid test is negative.  Labs show sodium 126 LFTs unremarkable WBC count is elevated at 17.2. Pt admitted for further management.    Today, pt denies any worsening SOB, chest pain, still coughing. Denies any other new complaints    Assessment/Plan: Principal Problem:   Acute respiratory failure with hypoxia (HCC) Active Problems:   Asthma   CKD (chronic kidney disease) stage 3, GFR 30-59 ml/min   Benign essential HTN   Atrial fibrillation with rapid ventricular response (HCC)   Acute systolic heart failure (HCC)   Acute bronchitis   Acute hypoxic respiratory failure likely 2/2 pneumonia with underlying asthma and CHF In the ED, drop sats to the 80s Sats currently above 90% on 2L of oxygen, plan to wean off Currently afebrile with leukocytosis (on steroids) CTA chest concerning for possible viral bronchiolitis versus bacterial with some consolidation in the right upper and lower lobes Continue azithromycin, ceftriaxone (will escalate to cover for HCAP if no sig improvement or patient deteriorates) Continue supplemental  oxygen, duonebs, inhalers, steroids Monitor closely  Paroxysmal A. fib with RVR HR fluctuating Failed DCCV during last admission, was started on amiodarone Status post diltiazem drip, discontinued due to low EF Cardiology on board, continue amiodarone, increase Coreg to 25 twice daily, continue Eliquis Plan for a repeat DCCV as an outpt Telemetry  Acute on chronic systolic HF/cardiomyopathy possibly tachy-mediated BNP elevated TEE done 01/2020 showed EF of 30 to 35% with global hypokinesis, moderate to severe MR and TR Continue Lasix, Coreg, Entresto Strict I's and O's, daily weights  Hyponatremia/hypomagnesemia Replace prn Daily BMP  Asthma Management as above  HLD Continue statins          Malnutrition Type:      Malnutrition Characteristics:      Nutrition Interventions:       Estimated body mass index is 25.97 kg/m as calculated from the following:   Height as of this encounter: 5\' 4"  (1.626 m).   Weight as of this encounter: 68.6 kg.     Code Status: Full  Family Communication: Spoke to husband at bedside on 02/14/20  Disposition Plan: Status is: Inpatient  Remains inpatient appropriate because:Inpatient level of care appropriate due to severity of illness   Dispo: The patient is from: Home              Anticipated d/c is to: Home              Anticipated d/c date is: 2 days              Patient currently is not  medically stable to d/c.    Consultants:  Cardiology  Procedures:  None  Antimicrobials:  Ceftriaxone  Azithromycin  DVT prophylaxis: Eliquis   Objective: Vitals:   02/14/20 0551 02/14/20 0643 02/14/20 0902 02/14/20 0908  BP:      Pulse: (!) 111 91    Resp:      Temp:      TempSrc:      SpO2: 96% 97% 96% 96%  Weight:      Height:        Intake/Output Summary (Last 24 hours) at 02/14/2020 1351 Last data filed at 02/14/2020 0300 Gross per 24 hour  Intake 240 ml  Output --  Net 240 ml   Filed Weights    02/13/20 0535 02/14/20 0500  Weight: 68.6 kg 68.6 kg    Exam:  General: NAD   Cardiovascular: S1, S2 present  Respiratory: Diminished BS bilaterally  Abdomen: Soft, nontender, nondistended, bowel sounds present  Musculoskeletal: Trace bilateral pedal edema noted  Skin: Normal  Psychiatry: Normal mood    Data Reviewed: CBC: Recent Labs  Lab 02/11/20 1703 02/13/20 0644 02/14/20 0449  WBC 17.2* 15.1* 15.3*  NEUTROABS  --  12.0* 12.5*  HGB 13.0 12.3 12.2  HCT 40.0 36.4 37.3  MCV 97.3 95.5 97.1  PLT 388 330 211   Basic Metabolic Panel: Recent Labs  Lab 02/11/20 1703 02/12/20 1007 02/13/20 0644 02/13/20 1103 02/14/20 0449  NA 126*  --  127* 131* 133*  K 3.6  --  3.2* 3.5 4.7  CL 92*  --  92* 97* 97*  CO2 23  --  23 22 24   GLUCOSE 128*  --  133* 162* 123*  BUN 10  --  11 11 13   CREATININE 0.93  --  0.82 0.90 0.88  CALCIUM 8.5*  --  7.8* 8.2* 8.2*  MG  --  1.3* 2.3  --  2.1   GFR: Estimated Creatinine Clearance: 57.4 mL/min (by C-G formula based on SCr of 0.88 mg/dL). Liver Function Tests: Recent Labs  Lab 02/12/20 0030  AST 31  ALT 33  ALKPHOS 59  BILITOT 0.6  PROT 5.7*  ALBUMIN 3.1*   Recent Labs  Lab 02/11/20 1703  LIPASE 20   No results for input(s): AMMONIA in the last 168 hours. Coagulation Profile: Recent Labs  Lab 02/13/20 2034  INR 1.4*   Cardiac Enzymes: No results for input(s): CKTOTAL, CKMB, CKMBINDEX, TROPONINI in the last 168 hours. BNP (last 3 results) No results for input(s): PROBNP in the last 8760 hours. HbA1C: No results for input(s): HGBA1C in the last 72 hours. CBG: No results for input(s): GLUCAP in the last 168 hours. Lipid Profile: No results for input(s): CHOL, HDL, LDLCALC, TRIG, CHOLHDL, LDLDIRECT in the last 72 hours. Thyroid Function Tests: No results for input(s): TSH, T4TOTAL, FREET4, T3FREE, THYROIDAB in the last 72 hours. Anemia Panel: No results for input(s): VITAMINB12, FOLATE, FERRITIN, TIBC, IRON,  RETICCTPCT in the last 72 hours. Urine analysis:    Component Value Date/Time   COLORURINE STRAW (A) 01/13/2017 2028   APPEARANCEUR CLEAR 01/13/2017 2028   LABSPEC 1.012 01/13/2017 2028   PHURINE 5.0 01/13/2017 2028   GLUCOSEU NEGATIVE 01/13/2017 2028   HGBUR NEGATIVE 01/13/2017 2028   BILIRUBINUR NEGATIVE 01/13/2017 2028   KETONESUR 5 (A) 01/13/2017 2028   PROTEINUR NEGATIVE 01/13/2017 2028   NITRITE NEGATIVE 01/13/2017 2028   LEUKOCYTESUR NEGATIVE 01/13/2017 2028   Sepsis Labs: @LABRCNTIP (procalcitonin:4,lacticidven:4)  ) Recent Results (from the past 240 hour(s))  SARS-COV-2 RNA,(COVID-19) QUAL NAAT     Status: None   Collection Time: 02/10/20  9:34 AM   Specimen: Respiratory  Result Value Ref Range Status   SARS CoV2 RNA NOT DETECTED NOT DETECT Final    Comment: . A Not Detected (negative) test result for this test means that SARS-CoV-2 RNA was not present in the specimen above the limit of detection. A negative result does not rule out the possibility of COVID-19 and should not be used as the sole basis for treatment or patient management  decisions.  If COVID-19 is still suspected, based on  exposure history together with other clinical findings, re-testing should be considered in consultation with public health authorities. Laboratory test results should always be considered in the context of clinical  observations and epidemiological data in making a final diagnosis and patient management decisions. . This patient specimen was tested using an FDA EUA pooling method. . Patient specimens with low viral loads may not be detected in sample pools due to the decreased sensitivity of  pooled testing. . Please review the "Fact Sheets" and FDA authorized labeling available for health care providers and patients using the foll owing websites: https://www.[email or web address]/home/Covid-19/[email or web address]/fact-sheet2    https://www.[facility].com/home/Covid-19/Patients/NAAT/ fact-sheet2 . This test has been authorized by the FDA under an  Emergency Use Authorization (EUA) for use by authorized laboratories. . Due to the current public health emergency, [facility] is receiving a high volume of samples from a wide variety of swabs and media for COVID-19 testing. In order to serve patients during this public health crisis, samples from appropriate clinical sources are  being tested. Negative test results derived from specimens received in non-commercially manufactured viral collection and transport media, or in media and sample collection kits not yet authorized by FDA for COVID-19 testing should be cautiously evaluated and the patient potentially subjected to extra precautions such as additional clinical monitoring, including collection of an additional specimen. . Methodology:  Nucle ic Acid Amplification Test (NAAT) includes RT-PCR or TMA . Additional information about COVID-19 can be found at the [employer] website: www.[email or web address].com/Covid19.   SARS Coronavirus 2 by RT PCR (hospital order, performed in [facility] hospital lab) Nasopharyngeal Nasopharyngeal Swab     Status: None   Collection Time: 02/12/20  1:08 AM   Specimen: Nasopharyngeal Swab  Result Value Ref Range Status   SARS Coronavirus 2 NEGATIVE NEGATIVE Final    Comment: (NOTE) SARS-CoV-2 target nucleic acids are NOT DETECTED.  The SARS-CoV-2 RNA is generally detectable in upper and lower respiratory specimens during the acute phase of infection. The lowest concentration of SARS-CoV-2 viral copies this assay can detect is 250 copies / mL. A negative result does not preclude SARS-CoV-2 infection and should not be used as the sole basis for treatment or other patient management decisions.  A negative result may occur with improper specimen collection / handling, submission of specimen other than  nasopharyngeal swab, presence of viral mutation(s) within the areas targeted by this assay, and inadequate number of viral copies (<250 copies / mL). A negative result must be combined with clinical observations, patient history, and epidemiological information.  Fact Sheet for Patients:   StrictlyIdeas.no  Fact Sheet for Healthcare Providers: BankingDealers.co.za  This test is not yet approved or  cleared by the [place] FDA and has been authorized for detection and/or diagnosis of SARS-CoV-2 by FDA under an Emergency Use Authorization (EUA).  This EUA will remain in effect (meaning this test can be used) for the duration of  the COVID-19 declaration under Section 564(b)(1) of the Act, 21 U.S.C. section 360bbb-3(b)(1), unless the authorization is terminated or revoked sooner.  Performed at [facility], [place] [address] [place], [place] [address]   Culture, sputum-assessment     Status: None   Collection Time: 02/12/20 10:11 AM   Specimen: Expectorated Sputum  Result Value Ref Range Status   Specimen Description Expect. Sput  Final   Special Requests NONE  Final   Sputum evaluation   Final    THIS SPECIMEN IS ACCEPTABLE FOR SPUTUM CULTURE Performed at [facility], [address], [place], [place] [address]    Report Status 02/12/2020 FINAL  Final  Culture, respiratory     Status: None   Collection Time: 02/12/20 10:11 AM  Result Value Ref Range Status   Specimen Description Expect. Sput  Final   Special Requests NONE Reflexed from [id number]  Final   Gram Stain   Final    RARE WBC PRESENT,BOTH PMN AND MONONUCLEAR RARE GRAM POSITIVE COCCI IN PAIRS RARE GRAM POSITIVE RODS RARE GRAM NEGATIVE RODS    Culture   Final    Consistent with normal respiratory flora. Performed at [facility], [place] [address] [place], [place] [address]    Report Status 02/14/2020 FINAL  Final  C Difficile Quick Screen w PCR reflex      Status: None   Collection Time: 02/12/20  2:43 PM   Specimen: STOOL  Result Value Ref Range Status   C Diff antigen NEGATIVE NEGATIVE Final   C Diff toxin NEGATIVE NEGATIVE Final   C Diff interpretation No C. difficile detected.  Final    Comment: Performed at [facility], [place] [address] [place], [place] [address]      Studies: DG CHEST PORT 1 VIEW  Result Date: 02/14/2020 CLINICAL DATA:  Shortness of breath EXAM: PORTABLE CHEST 1 VIEW COMPARISON:  February 11, 2020 FINDINGS: There is airspace opacity in the right upper lobe, new. There is atelectatic change in the left base with minimal left pleural effusion. Heart is borderline enlarged with pulmonary vascularity normal. No adenopathy. No bone lesions. IMPRESSION: Airspace opacity consistent with pneumonia right upper lobe. Stable atelectasis base with minimal left pleural effusion. Borderline cardiomegaly. No adenopathy. Electronically Signed   By: Lowella Grip III M.D.   On: 02/14/2020 08:18    Scheduled Meds: . amiodarone  200 mg Oral BID  . apixaban  5 mg Oral BID  . atorvastatin  40 mg Oral Daily  . azithromycin  500 mg Oral Daily  . calcium carbonate  0.5 tablet Oral Daily  . carvedilol  25 mg Oral BID WC  . dextromethorphan-guaiFENesin  1 tablet Oral BID  . fluticasone  2 spray Each Nare Daily  . furosemide  20 mg Intravenous Daily  . levalbuterol  0.63 mg Nebulization Q6H  . methylPREDNISolone (SOLU-MEDROL) injection  40 mg Intravenous Daily  . mometasone-formoterol  2 puff Inhalation BID  . pantoprazole  40 mg Oral Daily  . polyethylene glycol  17 g Oral Daily  . sacubitril-valsartan  1 tablet Oral BID    Continuous Infusions: . cefTRIAXone (ROCEPHIN)  IV 2 g (02/14/20 0045)     LOS: 2 days     Alma Friendly, MD [facility]  If 7PM-7AM, please contact night-coverage [email or web address] 02/14/2020, 1:51 PM

## 2020-02-14 NOTE — Discharge Instructions (Addendum)

## 2020-02-15 LAB — BASIC METABOLIC PANEL
Anion gap: 13 (ref 5–15)
BUN: 18 mg/dL (ref 8–23)
CO2: 23 mmol/L (ref 22–32)
Calcium: 8.4 mg/dL — ABNORMAL LOW (ref 8.9–10.3)
Chloride: 99 mmol/L (ref 98–111)
Creatinine, Ser: 0.95 mg/dL (ref 0.44–1.00)
GFR calc Af Amer: >60 mL/min (ref >60)
GFR calc non Af Amer: >60 mL/min (ref >60)
Glucose, Bld: 131 mg/dL — ABNORMAL HIGH (ref 70–99)
Potassium: 4 mmol/L (ref 3.5–5.1)
Sodium: 135 mmol/L (ref 135–145)

## 2020-02-15 LAB — CBC WITH DIFFERENTIAL/PLATELET
Abs Immature Granulocytes: 0.05 10*3/uL (ref 0.00–0.07)
Basophils Absolute: 0 10*3/uL (ref 0.0–0.1)
Basophils Relative: 0 %
Eosinophils Absolute: 0 10*3/uL (ref 0.0–0.5)
Eosinophils Relative: 0 %
HCT: 36.9 % (ref 36.0–46.0)
Hemoglobin: 12.2 g/dL (ref 12.0–15.0)
Immature Granulocytes: 0 %
Lymphocytes Relative: 10 %
Lymphs Abs: 1.5 10*3/uL (ref 0.7–4.0)
MCH: 32.2 pg (ref 26.0–34.0)
MCHC: 33.1 g/dL (ref 30.0–36.0)
MCV: 97.4 fL (ref 80.0–100.0)
Monocytes Absolute: 1.2 10*3/uL — ABNORMAL HIGH (ref 0.1–1.0)
Monocytes Relative: 9 %
Neutro Abs: 11.5 10*3/uL — ABNORMAL HIGH (ref 1.7–7.7)
Neutrophils Relative %: 81 %
Platelets: 360 10*3/uL (ref 150–400)
RBC: 3.79 MIL/uL — ABNORMAL LOW (ref 3.87–5.11)
RDW: 14.1 % (ref 11.5–15.5)
WBC: 14.3 10*3/uL — ABNORMAL HIGH (ref 4.0–10.5)
nRBC: 0 % (ref 0.0–0.2)

## 2020-02-15 MED ORDER — DIGOXIN 125 MCG PO TABS
0.1250 mg | ORAL_TABLET | Freq: Every day | ORAL | Status: DC
  Administered 2020-02-15 – 2020-02-16 (×2): 0.125 mg via ORAL
  Filled 2020-02-15 (×2): qty 1

## 2020-02-15 MED ORDER — STUDY - DAPA TIMI 68 - DAPAGLIFLOZIN (FARXIGA) 10 MG OR PLACEBO TABLET (PI-DALTON MCLEAN)
1.0000 | ORAL_TABLET | Freq: Every day | ORAL | Status: DC
  Administered 2020-02-15 – 2020-02-16 (×2): 1 via ORAL
  Filled 2020-02-15 (×2): qty 1

## 2020-02-15 MED ORDER — STUDY - DAPA TIMI 68 - DAPAGLIFLOZIN (FARXIGA) 10 MG OR PLACEBO TABLET (PI-DALTON MCLEAN)
1.0000 | ORAL_TABLET | Freq: Every day | ORAL | Status: DC
  Filled 2020-02-15: qty 1

## 2020-02-15 MED ORDER — DIGOXIN 125 MCG PO TABS: 0.1250 mg | ORAL_TABLET | Freq: Every day | ORAL | Status: DC

## 2020-02-15 MED ORDER — DIGOXIN 0.25 MG/ML IJ SOLN
0.2500 mg | Freq: Once | INTRAMUSCULAR | Status: AC
  Administered 2020-02-15: 0.25 mg via INTRAVENOUS
  Filled 2020-02-15: qty 2

## 2020-02-15 NOTE — Research (Addendum)
Subject Name: Erin Wong  Subject met inclusion and exclusion criteria.  The informed consent form, study requirements and expectations were reviewed with the subject and questions and concerns were addressed prior to the signing of the consent form. The subject verbalized understanding of the trial requirements.  The subject agreed to participate in the [place] ACT HF trial and signed the informed consent at 0930 on 02/15/2020. The informed consent was obtained prior to performance of any protocol-specific procedures for the subject.  A copy of the signed informed consent was given to the subject and a copy was placed in the subject's medical record.   [facility] Dawn   First dose give on 02/15/2020 at 11:18 am.

## 2020-02-15 NOTE — Progress Notes (Signed)
Progress Note  Patient Name: Erin Wong Date of Encounter: 02/15/2020  [facility] Cardiologist: Jenkins Rouge, MD   Subjective   Heart rate continues to go up with minimal exertion. Already on amiodarone, carvedilol. Discussed adding digoxin while she recovers from her lung infection to assist with rate control. She does feel better. Had questions regarding dapagliflozin, considering enrolling in clinical trial.   Inpatient Medications    Scheduled Meds: . amiodarone  200 mg Oral BID  . apixaban  5 mg Oral BID  . atorvastatin  40 mg Oral Daily  . azithromycin  500 mg Oral Daily  . calcium carbonate  0.5 tablet Oral Daily  . carvedilol  25 mg Oral BID WC  . dextromethorphan-guaiFENesin  1 tablet Oral BID  . digoxin  0.25 mg Intravenous Once  . digoxin  0.125 mg Oral Daily  . fluticasone  2 spray Each Nare Daily  . furosemide  20 mg Intravenous Daily  . levalbuterol  0.63 mg Nebulization Q6H  . methylPREDNISolone (SOLU-MEDROL) injection  40 mg Intravenous Daily  . mometasone-formoterol  2 puff Inhalation BID  . pantoprazole  40 mg Oral Daily  . polyethylene glycol  17 g Oral Daily  . sacubitril-valsartan  1 tablet Oral BID   Continuous Infusions: . cefTRIAXone (ROCEPHIN)  IV 2 g (02/14/20 2231)   PRN Meds: benzonatate, traMADol   Vital Signs    Vitals:   02/14/20 2325 02/15/20 0251 02/15/20 0341 02/15/20 0800  BP:   110/66   Pulse: 93   (!) 136  Resp:   20 20  Temp:   97.8 F (36.6 C)   TempSrc:   Oral   SpO2: 95% 98%  98%  Weight:   67.4 kg   Height:        Intake/Output Summary (Last 24 hours) at 02/15/2020 0949 Last data filed at 02/14/2020 2345 Gross per 24 hour  Intake 362.46 ml  Output --  Net 362.46 ml   Last 3 Weights 02/15/2020 02/14/2020 02/13/2020  Weight (lbs) 148 lb 8 oz 151 lb 4.8 oz 151 lb 4.8 oz  Weight (kg) 67.359 kg 68.629 kg 68.629 kg      Telemetry    Atrial fibrillation, rates 80s-140s with activity - Personally Reviewed  ECG     02/15/20  Atrial fibrillation with RVR at 115 bpm- Personally Reviewed  Physical Exam   GEN: Well nourished, well developed in no acute distress HEENT: Normal, moist mucous membranes NECK: No JVD CARDIAC: tachycardic, irregularly irregular rhythm, normal S1 and S2, no rubs or gallops. No murmur. VASCULAR: Radial and DP pulses 2+ bilaterally. No carotid bruits RESPIRATORY:  Coarse throughout but slightly improved from yesterday ABDOMEN: Soft, non-tender, non-distended MUSCULOSKELETAL:  Ambulates independently SKIN: Warm and dry, no edema NEUROLOGIC:  Alert and oriented x 3. No focal neuro deficits noted. PSYCHIATRIC:  Normal affect   Labs    High Sensitivity Troponin:   Recent Labs  Lab 02/12/20 0030 02/12/20 0502  TROPONINIHS 12 17      Chemistry Recent Labs  Lab 02/12/20 0030 02/13/20 0644 02/13/20 1103 02/14/20 0449 02/15/20 0448  NA  --    < > 131* 133* 135  K  --    < > 3.5 4.7 4.0  CL  --    < > 97* 97* 99  CO2  --    < > 22 24 23   GLUCOSE  --    < > 162* 123* 131*  BUN  --    < >  11 13 18   CREATININE  --    < > 0.90 0.88 0.95  CALCIUM  --    < > 8.2* 8.2* 8.4*  PROT 5.7*  --   --   --   --   ALBUMIN 3.1*  --   --   --   --   AST 31  --   --   --   --   ALT 33  --   --   --   --   ALKPHOS 59  --   --   --   --   BILITOT 0.6  --   --   --   --   GFRNONAA  --    < > >60 >60 >60  GFRAA  --    < > >60 >60 >60  ANIONGAP  --    < > 12 12 13    < > = values in this interval not displayed.     Hematology Recent Labs  Lab 02/13/20 0644 02/14/20 0449 02/15/20 0448  WBC 15.1* 15.3* 14.3*  RBC 3.81* 3.84* 3.79*  HGB 12.3 12.2 12.2  HCT 36.4 37.3 36.9  MCV 95.5 97.1 97.4  MCH 32.3 31.8 32.2  MCHC 33.8 32.7 33.1  RDW 13.5 13.9 14.1  PLT 330 349 360    BNP Recent Labs  Lab 02/12/20 0030  BNP 602.0*     DDimer No results for input(s): DDIMER in the last 168 hours.   Radiology    DG CHEST PORT 1 VIEW  Result Date: 02/14/2020 CLINICAL DATA:   Shortness of breath EXAM: PORTABLE CHEST 1 VIEW COMPARISON:  February 11, 2020 FINDINGS: There is airspace opacity in the right upper lobe, new. There is atelectatic change in the left base with minimal left pleural effusion. Heart is borderline enlarged with pulmonary vascularity normal. No adenopathy. No bone lesions. IMPRESSION: Airspace opacity consistent with pneumonia right upper lobe. Stable atelectasis base with minimal left pleural effusion. Borderline cardiomegaly. No adenopathy. Electronically Signed   By: Lowella Grip III M.D.   On: 02/14/2020 08:18    Cardiac Studies   Echo 01/14/20 and TEE 01/17/20 personally reviewed  Patient Profile     69 y.o. 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  Assessment & Plan    Atrial fibrillation with RVR: need improved heart rate control with exertion -adding digoxin today -continue amiodarone dose of 200 mg BID -on carvedilol 25 mg BID -no diltiazem given low EF -Aim for resting HR <90 or <110 with exertion. Likely driven by pneumonia -she has some fatigue but no palpitations -see note from yesterday. She would prefer to manage medically while recovering from lung infection. Consider cardioversion at outpatient follow up if still in afib -continue apixaban -K 4.0 today, Cr 0.95.   Cardiomyopathy, acute on chronic systolic and diastolic heart failure -continue carvedilol, entresto, furosemide -plan had been for repeat echo as an outpatient once in NSR, if EF still reduced then planned for cath -I/O not well charted this admission -discussing dapagliflozin as part of a clinical trial  Hypertension: normal to borderline low for most of this  admission -meds as above, no room to uptitrate entresto based on blood pressure  Hyperlipidemia: -continue atorvastatin 40 mg daily  Pneumonia vs bronchitis: -per primary team -on  azithromycin, ceftriaxone, and steroids  Multiple QT prolonging medications -QT stable today on ECG -would check daily in the hospital while on azithromycin and amiodarone. Avoid QT prolonging meds  Total time of encounter: 40 minutes spent in face-to-face patient care.   Buford Dresser, MD, PhD [facility]   For questions or updates, please contact [place] Please consult [email or web address] for contact info under     Signed, Buford Dresser, MD  02/15/2020, 9:49 AM

## 2020-02-15 NOTE — Progress Notes (Signed)
PROGRESS NOTE  Erin Wong VFI:[id number] DOB: Dec 04, 1950 DOA: 02/11/2020 PCP: [name], MD  HPI/Recap of past 24 hours: HPI from Dr Guy Sandifer is a 69 y.o. female with history of A. fib recently diagnosed cardiomyopathy admitted last month for A. fib with RVR with CHF and was placed on amiodarone and Eliquis at that time. Pt has been having productive cough of yellowish sputum and shortness of breath for the last 1 week had gone to her PCP about 2 days ago and was advised symptomatic treatment.  Patient states that over the last 2 days patient has become more acutely SOB, with some lower extremity edema. In the ED, patient was noted to be in A. fib with RVR was started on Cardizem drip.  CT angiogram of the chest done shows features concerning for bronchiolitis viral versus bacterial with some consolidation in the right upper and lower lobes. Covid test is negative.  Labs show sodium 126 LFTs unremarkable WBC count is elevated at 17.2. Pt admitted for further management.    Today patient denies any worsening shortness of breath, still coughing but has slightly improved.  Denies any chest pain, abdominal pain, nausea/vomiting, palpitations, fever/chills.    Assessment/Plan: Principal Problem:   Acute respiratory failure with hypoxia (HCC) Active Problems:   Asthma   CKD (chronic kidney disease) stage 3, GFR 30-59 ml/min   Benign essential HTN   Atrial fibrillation with rapid ventricular response (HCC)   Acute systolic heart failure (HCC)   Acute bronchitis   Acute hypoxic respiratory failure likely 2/2 pneumonia with underlying asthma and CHF In the ED, drop sats to the 80s Sats currently above 90% on 2L of oxygen, plan to wean off Currently afebrile with leukocytosis (on steroids) CTA chest concerning for possible viral bronchiolitis versus bacterial with some consolidation in the right upper and lower lobes Continue azithromycin, ceftriaxone Continue  supplemental oxygen, duonebs, inhalers, steroids Monitor closely  Paroxysmal A. fib with RVR HR fluctuating, still uncontrolled Failed DCCV during last admission, was started on amiodarone Status post diltiazem drip, discontinued due to low EF Cardiology on board, continue amiodarone, increase Coreg to 25 twice daily, continue Eliquis Plan for a repeat DCCV as an outpt Plan for repeat echo as an outpatient, if EF still reduced, will plan for ischemic work-up as per cardiology Telemetry  Acute on chronic systolic HF/cardiomyopathy possibly tachy-mediated BNP elevated TEE done 01/2020 showed EF of 30 to 35% with global hypokinesis, moderate to severe MR and TR Continue Lasix, Coreg, Entresto Strict I's and O's, daily weights  Hyponatremia/hypomagnesemia Replace prn Daily BMP  Asthma Management as above  HLD Continue statins          Malnutrition Type:      Malnutrition Characteristics:      Nutrition Interventions:       Estimated body mass index is 25.49 kg/m as calculated from the following:   Height as of this encounter: 5\' 4"  (1.626 m).   Weight as of this encounter: 67.4 kg.     Code Status: Full  Family Communication: Spoke to husband at bedside on 02/15/20  Disposition Plan: Status is: Inpatient  Remains inpatient appropriate because:Inpatient level of care appropriate due to severity of illness   Dispo: The patient is from: Home              Anticipated d/c is to: Home              Anticipated d/c date is: 1 day  Patient currently is not medically stable to d/c.,  Cardiology signed off, still requiring IV Lasix    Consultants:  Cardiology  Procedures:  None  Antimicrobials:  Ceftriaxone  Azithromycin  DVT prophylaxis: Eliquis   Objective: Vitals:   02/15/20 0341 02/15/20 0800 02/15/20 1300 02/15/20 1336  BP: 110/66  112/72   Pulse:  (!) 136 (!) 105 91  Resp: 20 20 19 18   Temp: 97.8 F (36.6 C)  98 F  (36.7 C)   TempSrc: Oral  Oral   SpO2:  98%  94%  Weight: 67.4 kg     Height:        Intake/Output Summary (Last 24 hours) at 02/15/2020 1557 Last data filed at 02/15/2020 1300 Gross per 24 hour  Intake 482.46 ml  Output --  Net 482.46 ml   Filed Weights   02/13/20 0535 02/14/20 0500 02/15/20 0341  Weight: 68.6 kg 68.6 kg 67.4 kg    Exam:  General: NAD   Cardiovascular: S1, S2 present  Respiratory: Diminished BS bilaterally  Abdomen: Soft, nontender, nondistended, bowel sounds present  Musculoskeletal: Trace bilateral pedal edema noted  Skin: Normal  Psychiatry: Normal mood    Data Reviewed: CBC: Recent Labs  Lab 02/11/20 1703 02/13/20 0644 02/14/20 0449 02/15/20 0448  WBC 17.2* 15.1* 15.3* 14.3*  NEUTROABS  --  12.0* 12.5* 11.5*  HGB 13.0 12.3 12.2 12.2  HCT 40.0 36.4 37.3 36.9  MCV 97.3 95.5 97.1 97.4  PLT 388 330 349 048   Basic Metabolic Panel: Recent Labs  Lab 02/11/20 1703 02/12/20 1007 02/13/20 0644 02/13/20 1103 02/14/20 0449 02/15/20 0448  NA 126*  --  127* 131* 133* 135  K 3.6  --  3.2* 3.5 4.7 4.0  CL 92*  --  92* 97* 97* 99  CO2 23  --  23 22 24 23   GLUCOSE 128*  --  133* 162* 123* 131*  BUN 10  --  11 11 13 18   CREATININE 0.93  --  0.82 0.90 0.88 0.95  CALCIUM 8.5*  --  7.8* 8.2* 8.2* 8.4*  MG  --  1.3* 2.3  --  2.1  --    GFR: Estimated Creatinine Clearance: 52.8 mL/min (by C-G formula based on SCr of 0.95 mg/dL). Liver Function Tests: Recent Labs  Lab 02/12/20 0030  AST 31  ALT 33  ALKPHOS 59  BILITOT 0.6  PROT 5.7*  ALBUMIN 3.1*   Recent Labs  Lab 02/11/20 1703  LIPASE 20   No results for input(s): AMMONIA in the last 168 hours. Coagulation Profile: Recent Labs  Lab 02/13/20 2034  INR 1.4*   Cardiac Enzymes: No results for input(s): CKTOTAL, CKMB, CKMBINDEX, TROPONINI in the last 168 hours. BNP (last 3 results) No results for input(s): PROBNP in the last 8760 hours. HbA1C: No results for input(s):  HGBA1C in the last 72 hours. CBG: No results for input(s): GLUCAP in the last 168 hours. Lipid Profile: No results for input(s): CHOL, HDL, LDLCALC, TRIG, CHOLHDL, LDLDIRECT in the last 72 hours. Thyroid Function Tests: No results for input(s): TSH, T4TOTAL, FREET4, T3FREE, THYROIDAB in the last 72 hours. Anemia Panel: No results for input(s): VITAMINB12, FOLATE, FERRITIN, TIBC, IRON, RETICCTPCT in the last 72 hours. Urine analysis:    Component Value Date/Time   COLORURINE STRAW (A) 01/13/2017 2028   APPEARANCEUR CLEAR 01/13/2017 2028   LABSPEC 1.012 01/13/2017 2028   PHURINE 5.0 01/13/2017 2028   GLUCOSEU NEGATIVE 01/13/2017 2028   HGBUR NEGATIVE 01/13/2017 2028  BILIRUBINUR NEGATIVE 01/13/2017 2028   KETONESUR 5 (A) 01/13/2017 2028   PROTEINUR NEGATIVE 01/13/2017 2028   NITRITE NEGATIVE 01/13/2017 2028   LEUKOCYTESUR NEGATIVE 01/13/2017 2028   Sepsis Labs: @LABRCNTIP (procalcitonin:4,lacticidven:4)  ) Recent Results (from the past 240 hour(s))  SARS-COV-2 RNA,(COVID-19) QUAL NAAT     Status: None   Collection Time: 02/10/20  9:34 AM   Specimen: Respiratory  Result Value Ref Range Status   SARS CoV2 RNA NOT DETECTED NOT DETECT Final    Comment: . A Not Detected (negative) test result for this test means that SARS-CoV-2 RNA was not present in the specimen above the limit of detection. A negative result does not rule out the possibility of COVID-19 and should not be used as the sole basis for treatment or patient management  decisions.  If COVID-19 is still suspected, based on  exposure history together with other clinical findings, re-testing should be considered in consultation with public health authorities. Laboratory test results should always be considered in the context of clinical  observations and epidemiological data in making a final diagnosis and patient management decisions. . This patient specimen was tested using an FDA EUA pooling method. . Patient  specimens with low viral loads may not be detected in sample pools due to the decreased sensitivity of  pooled testing. . Please review the "Fact Sheets" and FDA authorized labeling available for health care providers and patients using the foll owing websites: https://www.[email or web address]/[email or web address]/fact-[email or web address]  [email or web address]://[email or web address]/home/Covid-19/Patients/NAAT/ fact-sheet2 . This test has been authorized by the FDA under an  Emergency Use Authorization (EUA) for use by authorized laboratories. . Due to the current public health emergency, [facility] is receiving a high volume of samples from a wide variety of swabs and media for COVID-19 testing. In order to serve patients during this public health crisis, samples from appropriate clinical sources are  being tested. Negative test results derived from specimens received in non-commercially manufactured viral collection and transport media, or in media and sample collection kits not yet authorized by FDA for COVID-19 testing should be cautiously evaluated and the patient potentially subjected to extra precautions such as additional clinical monitoring, including collection of an additional specimen. . Methodology:  Nucle ic Acid Amplification Test (NAAT) includes RT-PCR or TMA . Additional information about COVID-19 can be found at the [employer] website: www.[email or web address].com/[email or web address].   SARS Coronavirus 2 by RT PCR (hospital order, performed in [facility] hospital lab) Nasopharyngeal Nasopharyngeal Swab     Status: None   Collection Time: 02/12/20  1:08 AM   Specimen: Nasopharyngeal Swab  Result Value Ref Range Status   SARS Coronavirus 2 NEGATIVE NEGATIVE Final    Comment: (NOTE) SARS-CoV-2 target nucleic acids are NOT DETECTED.  The SARS-CoV-2 RNA is generally detectable in upper and lower respiratory specimens during the acute phase of infection. The lowest concentration  of SARS-CoV-2 viral copies this assay can detect is 250 copies / mL. A negative result does not preclude SARS-CoV-2 infection and should not be used as the sole basis for treatment or other patient management decisions.  A negative result may occur with improper specimen collection / handling, submission of specimen other than nasopharyngeal swab, presence of viral mutation(s) within the areas targeted by this assay, and inadequate number of viral copies (<250 copies / mL). A negative result must be combined with clinical observations, patient history, and epidemiological information.  Fact Sheet for Patients:   StrictlyIdeas.no  Fact Sheet for Healthcare Providers: BankingDealers.co.za  This test is not yet approved  or  cleared by the [place] and has been authorized for detection and/or diagnosis of SARS-CoV-2 by FDA under an Emergency Use Authorization (EUA).  This EUA will remain in effect (meaning this test can be used) for the duration of the COVID-19 declaration under Section 564(b)(1) of the Act, 21 U.S.C. section 360bbb-3(b)(1), unless the authorization is terminated or revoked sooner.  Performed at [facility], [place] [address] [place], [place] [address]   Culture, sputum-assessment     Status: None   Collection Time: 02/12/20 10:11 AM   Specimen: Expectorated Sputum  Result Value Ref Range Status   Specimen Description Expect. Sput  Final   Special Requests NONE  Final   Sputum evaluation   Final    THIS SPECIMEN IS ACCEPTABLE FOR SPUTUM CULTURE Performed at [facility], [address], [place], [place] [address]    Report Status 02/12/2020 FINAL  Final  Culture, respiratory     Status: None   Collection Time: 02/12/20 10:11 AM  Result Value Ref Range Status   Specimen Description Expect. Sput  Final   Special Requests NONE Reflexed from [id number]  Final   Gram Stain   Final    RARE WBC  PRESENT,BOTH PMN AND MONONUCLEAR RARE GRAM POSITIVE COCCI IN PAIRS RARE GRAM POSITIVE RODS RARE GRAM NEGATIVE RODS    Culture   Final    Consistent with normal respiratory flora. Performed at [facility], [place] [address] [place], [place] [address]    Report Status 02/14/2020 FINAL  Final  C Difficile Quick Screen w PCR reflex     Status: None   Collection Time: 02/12/20  2:43 PM   Specimen: STOOL  Result Value Ref Range Status   C Diff antigen NEGATIVE NEGATIVE Final   C Diff toxin NEGATIVE NEGATIVE Final   C Diff interpretation No C. difficile detected.  Final    Comment: Performed at [facility], [place] [address], [place], [place] [address]      Studies: No results found.  Scheduled Meds: . amiodarone  200 mg Oral BID  . apixaban  5 mg Oral BID  . atorvastatin  40 mg Oral Daily  . azithromycin  500 mg Oral Daily  . calcium carbonate  0.5 tablet Oral Daily  . carvedilol  25 mg Oral BID WC  . dextromethorphan-guaiFENesin  1 tablet Oral BID  . digoxin  0.125 mg Oral Daily  . fluticasone  2 spray Each Nare Daily  . furosemide  20 mg Intravenous Daily  . levalbuterol  0.63 mg Nebulization Q6H  . methylPREDNISolone (SOLU-MEDROL) injection  40 mg Intravenous Daily  . mometasone-formoterol  2 puff Inhalation BID  . pantoprazole  40 mg Oral Daily  . polyethylene glycol  17 g Oral Daily  . sacubitril-valsartan  1 tablet Oral BID  . DAPA TIMI 68 dapagliflozin or placebo  1 tablet Oral Daily    Continuous Infusions: . cefTRIAXone (ROCEPHIN)  IV 2 g (02/14/20 2231)     LOS: 3 days     Alma Friendly, MD [facility]  If 7PM-7AM, please contact night-coverage [email or web address] 02/15/2020, 3:57 PM

## 2020-02-15 NOTE — Telephone Encounter (Signed)
Referral orders placed. Left message return call to inform patient

## 2020-02-15 NOTE — Progress Notes (Signed)
Physical Therapy Treatment Patient Details Name: Erin Wong MRN: [id number] DOB: 1951-05-24 Today's Date: 02/15/2020    History of Present Illness Patient is a 69 yo female who presents for Acute hypoxic respiratory failure likely 2/2 pneumonia with underlying asthma and CHF    PT Comments    Pt reports feeling much better since she was given "new heart medication". Pt husband and granddaughter in room very support of going for a walk. Pt is limited in safe mobility by long standing leg length difference from prior injury which compromises pt's balance, as well as generalized weakness from hospitalization. Pt is getting close to her baseline level of function. Pt is independent in transfers and min guard for ambulation and ascent/descent of 7 steps with rail on L. D/c plans remain appropriate at this time. PT will continue to follow acutely.    Follow Up Recommendations  No PT follow up     Equipment Recommendations  None recommended by PT       Precautions / Restrictions Precautions Precaution Comments: watch O2 Restrictions Weight Bearing Restrictions: No    Mobility  Bed Mobility               General bed mobility comments: sitting EoB on entry   Transfers Overall transfer level: Independent Equipment used: None             General transfer comment: good power and steadying   Ambulation/Gait Ambulation/Gait assistance: Supervision;Min guard Gait Distance (Feet): 350 Feet Assistive device: None Gait Pattern/deviations: Step-through pattern;Decreased step length - right;Decreased stance time - right;Wide base of support Gait velocity: decreased Gait velocity interpretation: 1.31 - 2.62 ft/sec, indicative of limited community ambulator General Gait Details: min guard progressing to supervision, pt with reagular altered gait pattern which creastes mild instability, however pt with no overt LoB   Stairs Stairs: Yes Stairs assistance: Min guard Stair  Management: One rail Left;Step to pattern;Forwards Number of Stairs: 7 General stair comments: pt utilizes a step to pattern due to her prior R LE injury, min guard for safety        Balance Overall balance assessment: Mild deficits observed, not formally tested                                          Cognition Arousal/Alertness: Awake/alert Behavior During Therapy: WFL for tasks assessed/performed Overall Cognitive Status: Within Functional Limits for tasks assessed                                           General Comments General comments (skin integrity, edema, etc.): husband and grandaughter at bedside, SaO2 on RA throughout session >90%O2 max HR noted 120 bpm after stair training      Pertinent Vitals/Pain Pain Assessment: No/denies pain           PT Goals (current goals can now be found in the care plan section) Acute Rehab PT Goals Patient Stated Goal: to breath better PT Goal Formulation: With patient Time For Goal Achievement: 02/27/20 Potential to Achieve Goals: Good Progress towards PT goals: Progressing toward goals    Frequency    Min 3X/week      PT Plan Current plan remains appropriate       AM-PAC PT "6 Clicks" Mobility   Outcome  Measure  Help needed turning from your back to your side while in a flat bed without using bedrails?: None Help needed moving from lying on your back to sitting on the side of a flat bed without using bedrails?: None Help needed moving to and from a bed to a chair (including a wheelchair)?: A Little Help needed standing up from a chair using your arms (e.g., wheelchair or bedside chair)?: A Little Help needed to walk in hospital room?: A Little Help needed climbing 3-5 steps with a railing? : A Lot 6 Click Score: 19    End of Session Equipment Utilized During Treatment: Gait belt Activity Tolerance: Patient tolerated treatment well Patient left: in bed;with call bell/phone  within reach;with family/visitor present (sitting EOB ) Nurse Communication: Mobility status PT Visit Diagnosis: Difficulty in walking, not elsewhere classified (R26.2)     Time: 1351-1405 PT Time Calculation (min) (ACUTE ONLY): 14 min  Charges:  $Gait Training: 8-22 mins                     Mikaili Flippin B. Migdalia Dk PT, DPT Acute Rehabilitation Services Pager [phone] Office [phone]    [place] 02/15/2020, 2:19 PM

## 2020-02-15 NOTE — Plan of Care (Signed)
?  Problem: Activity: ?Goal: Capacity to carry out activities will improve ?Outcome: Progressing ?  ?

## 2020-02-15 NOTE — Progress Notes (Signed)
Occupational Therapy Treatment + D/C today Patient Details Name: Erin Wong MRN: [id number] DOB: June 13, 1951 Today's Date: 02/15/2020    History of present illness Patient is a 69 yo female who presents for Acute hypoxic respiratory failure likely 2/2 pneumonia with underlying asthma and CHF   OT comments  Pt progressing well enough for OT d/c today. Pt able to ambulate in bathroom; simulate tub transfer and reports that her spouse can assist as needed. Pt able to reach feet for LB dressing. Pt education provided for energy conservation with good recall. O2 >93% with exertion on RA throughout session. Other VSS. Pt does not require continued OT skilled services. OT signing off.    Follow Up Recommendations  No OT follow up;Supervision - Intermittent    Equipment Recommendations  None recommended by OT    Recommendations for Other Services      Precautions / Restrictions Precautions Precautions: Other (comment) Precaution Comments: watch O2 Restrictions Weight Bearing Restrictions: No       Mobility Bed Mobility Overal bed mobility: Modified Independent             General bed mobility comments: sitting EoB on entry   Transfers Overall transfer level: Independent Equipment used: None             General transfer comment: no assist required    Balance Overall balance assessment: No apparent balance deficits (not formally assessed)                                         ADL either performed or assessed with clinical judgement   ADL Overall ADL's : Modified independent;At baseline                                     Functional mobility during ADLs: Supervision/safety General ADL Comments: Pt able to ambulate in bathroom; simulate tub transfer and reports that her spouse can assist as needed. Pt able to reach feet for LB dressing. Pt education provided for energy conservation with good recall.      Vision        Perception     Praxis      Cognition Arousal/Alertness: Awake/alert Behavior During Therapy: WFL for tasks assessed/performed Overall Cognitive Status: Within Functional Limits for tasks assessed                                          Exercises     Shoulder Instructions       General Comments spouse in room    Pertinent Vitals/ Pain       Pain Assessment: No/denies pain  Home Living                                          Prior Functioning/Environment              Frequency           Progress Toward Goals  OT Goals(current goals can now be found in the care plan section)  Progress towards OT goals: Goals met/education completed, patient discharged from OT  Acute Rehab OT Goals  Patient Stated Goal: to breathe better ADL Goals Pt Will Perform Grooming: with modified independence Pt Will Perform Upper Body Bathing: with modified independence Pt Will Transfer to Toilet: with modified independence Pt Will Perform Tub/Shower Transfer: with min guard assist Pt/caregiver will Perform Home Exercise Program: With Supervision  Plan Discharge plan remains appropriate;All goals met and education completed, patient discharged from OT services    Co-evaluation                 AM-PAC OT "6 Clicks" Daily Activity     Outcome Measure   Help from another person eating meals?: None Help from another person taking care of personal grooming?: None Help from another person toileting, which includes using toliet, bedpan, or urinal?: None Help from another person bathing (including washing, rinsing, drying)?: A Little Help from another person to put on and taking off regular upper body clothing?: None Help from another person to put on and taking off regular lower body clothing?: None 6 Click Score: 23    End of Session    OT Visit Diagnosis: Unsteadiness on feet (R26.81);Muscle weakness (generalized) (M62.81)   Activity  Tolerance Patient tolerated treatment well   Patient Left in bed;with call bell/phone within reach;with family/visitor present   Nurse Communication Mobility status        Time: 3112-1624 OT Time Calculation (min): 13 min  Charges: OT General Charges $OT Visit: 1 Visit OT Treatments $Self Care/Home Management : 8-22 mins  Erin Wong, OTR/L Acute Rehabilitation Services Pager: [phone] Office: [phone]    Erin Wong C 02/15/2020, 5:05 PM

## 2020-02-16 ENCOUNTER — Encounter: Payer: Self-pay | Admitting: Family Medicine

## 2020-02-16 ENCOUNTER — Telehealth: Payer: Self-pay | Admitting: Cardiovascular Disease

## 2020-02-16 LAB — CBC WITH DIFFERENTIAL/PLATELET
Abs Immature Granulocytes: 0.07 10*3/uL (ref 0.00–0.07)
Basophils Absolute: 0 10*3/uL (ref 0.0–0.1)
Basophils Relative: 0 %
Eosinophils Absolute: 0 10*3/uL (ref 0.0–0.5)
Eosinophils Relative: 0 %
HCT: 36.8 % (ref 36.0–46.0)
Hemoglobin: 12.1 g/dL (ref 12.0–15.0)
Immature Granulocytes: 1 %
Lymphocytes Relative: 14 %
Lymphs Abs: 1.7 10*3/uL (ref 0.7–4.0)
MCH: 32 pg (ref 26.0–34.0)
MCHC: 32.9 g/dL (ref 30.0–36.0)
MCV: 97.4 fL (ref 80.0–100.0)
Monocytes Absolute: 1.3 10*3/uL — ABNORMAL HIGH (ref 0.1–1.0)
Monocytes Relative: 11 %
Neutro Abs: 8.9 10*3/uL — ABNORMAL HIGH (ref 1.7–7.7)
Neutrophils Relative %: 74 %
Platelets: 343 10*3/uL (ref 150–400)
RBC: 3.78 MIL/uL — ABNORMAL LOW (ref 3.87–5.11)
RDW: 13.7 % (ref 11.5–15.5)
WBC: 11.9 10*3/uL — ABNORMAL HIGH (ref 4.0–10.5)
nRBC: 0 % (ref 0.0–0.2)

## 2020-02-16 LAB — BASIC METABOLIC PANEL
Anion gap: 10 (ref 5–15)
BUN: 23 mg/dL (ref 8–23)
CO2: 24 mmol/L (ref 22–32)
Calcium: 8.3 mg/dL — ABNORMAL LOW (ref 8.9–10.3)
Chloride: 101 mmol/L (ref 98–111)
Creatinine, Ser: 0.92 mg/dL (ref 0.44–1.00)
GFR calc Af Amer: >60 mL/min (ref >60)
GFR calc non Af Amer: >60 mL/min (ref >60)
Glucose, Bld: 113 mg/dL — ABNORMAL HIGH (ref 70–99)
Potassium: 3.7 mmol/L (ref 3.5–5.1)
Sodium: 135 mmol/L (ref 135–145)

## 2020-02-16 MED ORDER — AMOXICILLIN-POT CLAVULANATE 875-125 MG PO TABS
1.0000 | ORAL_TABLET | Freq: Two times a day (BID) | ORAL | 0 refills | Status: AC
Start: 2020-02-16 — End: 2020-02-18

## 2020-02-16 MED ORDER — LEVALBUTEROL HCL 0.63 MG/3ML IN NEBU
0.6300 mg | INHALATION_SOLUTION | Freq: Two times a day (BID) | RESPIRATORY_TRACT | Status: DC
  Administered 2020-02-16: 0.63 mg via RESPIRATORY_TRACT
  Filled 2020-02-16: qty 3

## 2020-02-16 MED ORDER — DM-GUAIFENESIN ER 30-600 MG PO TB12: 1.0000 | ORAL_TABLET | Freq: Two times a day (BID) | ORAL | 0 refills | Status: DC

## 2020-02-16 MED ORDER — DIGOXIN 125 MCG PO TABS: 0.1250 mg | ORAL_TABLET | Freq: Every day | ORAL | 0 refills | Status: DC

## 2020-02-16 MED ORDER — STUDY - INVESTIGATIONAL MEDICATION
0 refills | Status: DC
Start: 2020-02-16 — End: 2020-04-18

## 2020-02-16 MED ORDER — FUROSEMIDE 20 MG PO TABS: 20.0000 mg | ORAL_TABLET | Freq: Every day | ORAL | Status: DC

## 2020-02-16 MED ORDER — CARVEDILOL 25 MG PO TABS: 25.0000 mg | ORAL_TABLET | Freq: Two times a day (BID) | ORAL | 1 refills | Status: DC

## 2020-02-16 MED ORDER — BENZONATATE 100 MG PO CAPS: 100.0000 mg | ORAL_CAPSULE | Freq: Three times a day (TID) | ORAL | 0 refills | Status: DC | PRN

## 2020-02-16 MED ORDER — PREDNISONE 20 MG PO TABS: ORAL_TABLET | ORAL | 0 refills | Status: DC

## 2020-02-16 NOTE — Care Management Important Message (Signed)
Important Message  Patient Details  Name: Erin Wong MRN: [id number] Date of Birth: 05/31/1951   Medicare Important Message Given:  Yes     [name] 02/16/2020, 10:39 AM

## 2020-02-16 NOTE — Progress Notes (Signed)
Progress Note  Patient Name: Erin Wong Date of Encounter: 02/16/2020  [facility] Cardiologist: Jenkins Rouge, MD   Subjective   I/Os not complete but weight is down 1 lb. She is in afib with rates around 100bpm. Overall feeling good. No chest pain.   Inpatient Medications    Scheduled Meds:  amiodarone  200 mg Oral BID   apixaban  5 mg Oral BID   atorvastatin  40 mg Oral Daily   azithromycin  500 mg Oral Daily   calcium carbonate  0.5 tablet Oral Daily   carvedilol  25 mg Oral BID WC   dextromethorphan-guaiFENesin  1 tablet Oral BID   digoxin  0.125 mg Oral Daily   fluticasone  2 spray Each Nare Daily   furosemide  20 mg Intravenous Daily   levalbuterol  0.63 mg Nebulization BID   methylPREDNISolone (SOLU-MEDROL) injection  40 mg Intravenous Daily   mometasone-formoterol  2 puff Inhalation BID   pantoprazole  40 mg Oral Daily   polyethylene glycol  17 g Oral Daily   sacubitril-valsartan  1 tablet Oral BID   DAPA TIMI 68 dapagliflozin or placebo  1 tablet Oral Daily   Continuous Infusions:  cefTRIAXone (ROCEPHIN)  IV 2 g (02/15/20 2247)   PRN Meds: traMADol   Vital Signs    Vitals:   02/15/20 2027 02/16/20 0000 02/16/20 0252 02/16/20 0725  BP:  112/74 116/76   Pulse:    (!) 106  Resp:  18 18 20   Temp:  97.8 F (36.6 C) 98 F (36.7 C)   TempSrc:  Oral Oral   SpO2: 99%   100%  Weight:   67.1 kg   Height:        Intake/Output Summary (Last 24 hours) at 02/16/2020 0732 Last data filed at 02/15/2020 1300 Gross per 24 hour  Intake 120 ml  Output --  Net 120 ml   Last 3 Weights 02/16/2020 02/15/2020 02/14/2020  Weight (lbs) 147 lb 14.9 oz 148 lb 8 oz 151 lb 4.8 oz  Weight (kg) 67.1 kg 67.359 kg 68.629 kg      Telemetry    Afib HR 90-110 - Personally Reviewed  ECG    Aifb 111 bpm, nonspecific T wave changes lateral leads - Personally Reviewed  Physical Exam   GEN: No acute distress.   Neck: No JVD Cardiac: RRR, no murmurs,  rubs, or gallops.  Respiratory: Clear to auscultation bilaterally. GI: Soft, nontender, non-distended  MS: No edema; No deformity. Neuro:  Nonfocal  Psych: Normal affect   Labs    High Sensitivity Troponin:   Recent Labs  Lab 02/12/20 0030 02/12/20 0502  TROPONINIHS 12 17      Chemistry Recent Labs  Lab 02/12/20 0030 02/13/20 0644 02/14/20 0449 02/15/20 0448 02/16/20 0433  NA  --    < > 133* 135 135  K  --    < > 4.7 4.0 3.7  CL  --    < > 97* 99 101  CO2  --    < > 24 23 24   GLUCOSE  --    < > 123* 131* 113*  BUN  --    < > 13 18 23   CREATININE  --    < > 0.88 0.95 0.92  CALCIUM  --    < > 8.2* 8.4* 8.3*  PROT 5.7*  --   --   --   --   ALBUMIN 3.1*  --   --   --   --  AST 31  --   --   --   --   ALT 33  --   --   --   --   ALKPHOS 59  --   --   --   --   BILITOT 0.6  --   --   --   --   GFRNONAA  --    < > >60 >60 >60  GFRAA  --    < > >60 >60 >60  ANIONGAP  --    < > 12 13 10    < > = values in this interval not displayed.     Hematology Recent Labs  Lab 02/14/20 0449 02/15/20 0448 02/16/20 0433  WBC 15.3* 14.3* 11.9*  RBC 3.84* 3.79* 3.78*  HGB 12.2 12.2 12.1  HCT 37.3 36.9 36.8  MCV 97.1 97.4 97.4  MCH 31.8 32.2 32.0  MCHC 32.7 33.1 32.9  RDW 13.9 14.1 13.7  PLT 349 360 343    BNP Recent Labs  Lab 02/12/20 0030  BNP 602.0*     DDimer No results for input(s): DDIMER in the last 168 hours.   Radiology    No results found.  Cardiac Studies   Echo 01/14/20  1. Left ventricular ejection fraction, by estimation, is 30 to 35%. The  left ventricle has moderately decreased function. The left ventricle  demonstrates global hypokinesis. The left ventricular internal cavity size  was mildly dilated. Left ventricular  diastolic parameters are indeterminate. There is akinesis of the left  ventricular, mid-apical inferior wall, anterior wall, inferolateral wall,  anterolateral wall and lateral wall. There is akinesis of the left  ventricular,  apical septal wall and apical  segment.  2. Right ventricular systolic function is mildly reduced. The right  ventricular size is mildly enlarged. There is moderately elevated  pulmonary artery systolic pressure. The estimated right ventricular  systolic pressure is 84.1 mmHg.  3. Left atrial size was severely dilated.  4. The mitral valve is normal in structure. Mild to moderate mitral valve  regurgitation. No evidence of mitral stenosis.  5. Tricuspid valve regurgitation is mild to moderate.  6. The aortic valve is normal in structure. Aortic valve regurgitation is  trivial. No aortic stenosis is present.  7. The inferior vena cava is dilated in size with <50% respiratory  variability, suggesting right atrial pressure of 15 mmHg.   Echo TEE 01/17/20 1. Left ventricular ejection fraction, by estimation, is 30 to 35%. The  left ventricle has moderately decreased function. The left ventricle  demonstrates global hypokinesis. The left ventricular internal cavity size  was mildly dilated. Left ventricular  diastolic parameters are indeterminate. There is akinesis of the left  ventricular, mid-apical inferior wall, anterior wall, inferolateral wall,  anterolateral wall and lateral wall. There is akinesis of the left  ventricular, apical septal wall and apical  segment.  2. Right ventricular systolic function is mildly reduced. The right  ventricular size is mildly enlarged. There is moderately elevated  pulmonary artery systolic pressure. The estimated right ventricular  systolic pressure is 32.4 mmHg.  3. Left atrial size was severely dilated.  4. The mitral valve is normal in structure. Mild to moderate mitral valve  regurgitation. No evidence of mitral stenosis.  5. Tricuspid valve regurgitation is mild to moderate.  6. The aortic valve is normal in structure. Aortic valve regurgitation is  trivial. No aortic stenosis is present.  7. The inferior vena cava is dilated in  size with <50% respiratory  variability, suggesting right atrial pressure of 15 mmHg.    Patient Profile     69 y.o. 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  Assessment & Plan    afib RVR - Heart rates elevated in the setting of PNA - Digoxin added for better rate control - amiodarone 200 mg BID - coreg 25 mg BID - no Dilt with low EF  - Patient remains in afib with rates around 100 - DCCV was discussed but patient would prefer medical management. Can consider DCCV as OP if still in afib - continue Eliquis  Cardiomyopathy/Acute on chronic combined CHF - EF 30-35%, unsure if CM is tachymediated - Plan was to re-check EF and if still low than plan for cath - continue coreg, entresto - IV lasix 20 mg daily - I/Os not correct - wt. 151>147lbs since admission - creatinine stable - Euvolemic on exam, can likely switch to PO lasix  HTN - entresto, coreg - pressure this AM 116/76  HLD - atorvastatin 40 mg daily  PNA vs bronchitis - abx per primary  QT prolonging meds - QTc 446 ms  For questions or updates, please contact [facility] Please consult [email or web address] for contact info under        Signed, Lekha Dancer Ninfa Meeker, PA-C  02/16/2020, 7:32 AM

## 2020-02-16 NOTE — Telephone Encounter (Signed)
Patient is currently admitted. Try TOC call 06/17

## 2020-02-16 NOTE — Discharge Summary (Signed)
Physician Discharge Summary  Erin Wong KPT:[id number] DOB: Mar 17, 1951 DOA: 02/11/2020  PCP: Erin Frizzle, MD  Admit date: 02/11/2020 Discharge date: 02/16/2020  Admitted From: Home.  Disposition: Home.   Recommendations for Outpatient Follow-up:  1. Follow up with PCP in 1-2 weeks 2. Please obtain BMP/CBC in one week 3. Please follow up with cardiology as scheduled. 4. Please follow up with a CXR in 2 to 4 weeks   Home Health:none.   Discharge Condition: STABLE.  CODE STATUS: FULL CODE.  Diet recommendation: Heart Healthy  Brief/Interim Summary: Erin Wong a 69 y.o.femalewithhistory of A. fib recently diagnosed cardiomyopathy admitted last month for A. fib with RVR with CHF and was placed on amiodarone and Eliquis at that time. Pt has been having productive cough of yellowish sputum and shortness of breath for the last 1 week had gone to her PCP about 2 days ago and was advised symptomatic treatment. Patient states that over the last 2 days patient has become more acutely SOB, with some lower extremity edema. In the ED, patient was noted to be in A. fib with RVR was started on Cardizem drip. CT angiogram of the chest done shows features concerning for bronchiolitis viral versus bacterial with some consolidation in the right upper and lower lobes. Covid test is negative. Labs show sodium 126 LFTs unremarkable WBC count is elevated at 17.2. Pt admitted for further management. She was started on IV antibiotics and cardiology consulted.  She was transitioned to oral antibiotics, weaned off oxygen and transitioned to oral digoxin on discharge.   Discharge Diagnoses:  Principal Problem:   Acute respiratory failure with hypoxia (HCC) Active Problems:   Asthma   CKD (chronic kidney disease) stage 3, GFR 30-59 ml/min   Benign essential HTN   Atrial fibrillation with rapid ventricular response (HCC)   Acute systolic heart failure (HCC)   Acute bronchitis  Acute hypoxic  respiratory failure likely 2/2 pneumonia with underlying asthma and CHF Much improved, weaned off oxygen. With oxygen sats between 92 to 95% on RA.  Currently afebrile with leukocytosis (on steroids) CTA chest concerning for possible viral bronchiolitis versus bacterial with some consolidation in the right upper and lower lobes  she was started on azithromycin, ceftriaxone, completed 5 days of IV antibiotics, plan for another 2 days of Augmentin on discharge.  Continue supplemental oxygen, duonebs, inhalers, and taper steroids on discharge.    Paroxysmal A. fib with RVR Better controlled today on digoxin.  Failed DCCV during last admission, was started on amiodarone Status post diltiazem drip, discontinued due to low EF Cardiology on board, continue amiodarone, increase Coreg to 25 twice daily, continue Eliquis Plan for a repeat DCCV as an outpt Plan for repeat echo as an outpatient, if EF still reduced, plan for ischemic work-up as per cardiology   Acute on chronic systolic HF/cardiomyopathy possibly tachy-mediated BNP elevated TEE done 01/2020 showed EF of 30 to 35% with global hypokinesis, moderate to severe MR and TR Continue Lasix, Coreg, Entresto Strict I's and O's, daily weights  Hyponatremia/hypomagnesemia Replaced.   Asthma Management as above  HLD Continue statins     Discharge Instructions  Discharge Instructions    Diet - low sodium heart healthy   Complete by: As directed    Discharge instructions   Complete by: As directed    Please follow up with cardiology as scheduled.  Please obtain CXR in 2 to 4 weeks to evaluate for resolution of the pneumonia.     Allergies  as of 02/16/2020   No Known Allergies     Medication List    TAKE these medications   acetaminophen 650 MG CR tablet Commonly known as: TYLENOL Take 2 tablets (1,300 mg total) by mouth 2 (two) times daily as needed for pain.   albuterol 108 (90 Base) MCG/ACT inhaler Commonly known  as: VENTOLIN HFA Inhale 1 puff into the lungs every 6 (six) hours as needed for wheezing or shortness of breath.   amiodarone 200 MG tablet Commonly known as: PACERONE Take 1 tablet (200mg ) twice daily What changed:   how much to take  how to take this  when to take this  additional instructions   amoxicillin-clavulanate 875-125 MG tablet Commonly known as: Augmentin Take 1 tablet by mouth every 12 (twelve) hours for 2 days.   apixaban 5 MG Tabs tablet Commonly known as: ELIQUIS Take 1 tablet (5 mg total) by mouth 2 (two) times daily.   atorvastatin 40 MG tablet Commonly known as: LIPITOR TAKE 1 TABLET BY MOUTH EVERY DAY   benzonatate 100 MG capsule Commonly known as: TESSALON Take 1 capsule (100 mg total) by mouth 3 (three) times daily as needed for cough. What changed: when to take this   calcium gluconate 500 MG tablet Take 1 tablet by mouth daily.   carvedilol 25 MG tablet Commonly known as: COREG Take 1 tablet (25 mg total) by mouth 2 (two) times daily with a meal. What changed:   medication strength  how much to take   cholecalciferol 1000 units tablet Commonly known as: VITAMIN D Take 1 tablet (1,000 Units total) by mouth daily.   dextromethorphan-guaiFENesin 30-600 MG 12hr tablet Commonly known as: MUCINEX DM Take 1 tablet by mouth 2 (two) times daily.   digoxin 0.125 MG tablet Commonly known as: LANOXIN Take 1 tablet (0.125 mg total) by mouth daily. Start taking on: February 17, 2020   fluticasone 50 MCG/ACT nasal spray Commonly known as: FLONASE Place 2 sprays into both nostrils daily.   Fluticasone-Salmeterol 100-50 MCG/DOSE Aepb Commonly known as: Advair Diskus INHALE 1 PUFF INTO THE LUNGS TWICE A DAY What changed:   how much to take  how to take this  when to take this  additional instructions   furosemide 20 MG tablet Commonly known as: LASIX Take 1 tablet (20 mg total) by mouth daily.   Investigational - Study Medication Study  - DAPA TIMI 68 - dapagliflozin (FARXIGA) 10 mg or placebo tablet (PI-Dalton McLean): 1 tablet, Oral, Daily, First dose (after last modification) on Tue 02/15/20 at 1100   omeprazole 20 MG capsule Commonly known as: PRILOSEC TAKE 1 CAPSULE BY MOUTH EVERY DAY What changed: how much to take   polyethylene glycol 17 g packet Commonly known as: MIRALAX / GLYCOLAX Take 17 g by mouth daily. What changed: Another medication with the same name was removed. Continue taking this medication, and follow the directions you see here.   predniSONE 20 MG tablet Commonly known as: Deltasone Prednisone 40 mg daily for 3 days followed by  Prednisone 20 mg daily for 3 days.   sacubitril-valsartan 24-26 MG Commonly known as: ENTRESTO Take 1 tablet by mouth 2 (two) times daily.   traMADol 50 MG tablet Commonly known as: ULTRAM TAKE 2 TABLETS BY MOUTH 3 TIMES A DAY AS NEEDED FOR PAIN What changed: See the new instructions.       Follow-up Information    Darreld Mclean, PA-C Follow up on 02/25/2020.   Specialties: Librarian, academic,  Cardiology Why: @ 2:30 Contact information: [place] [address] [phone]              No Known Allergies  Consultations: Cardiology.   Procedures/Studies: DG Chest 2 View  Result Date: 02/11/2020 CLINICAL DATA:  Shortness of breath and productive cough for 2 days. EXAM: CHEST - 2 VIEW COMPARISON:  01/13/2020, 01/13/2017 FINDINGS: Mild cardiomegaly, unchanged from prior allowing for differences in technique. No pulmonary edema. Chronic left basilar pleuroparenchymal scarring. Mild biapical pleuroparenchymal scarring. No acute airspace disease. No pneumothorax. No significant pleural fluid or confluent airspace disease. Osseous structures are intact. IMPRESSION: 1. No acute abnormality. 2. Stable mild cardiomegaly and left basilar pleuroparenchymal scarring. Electronically Signed   By: Keith Rake M.D.   On: 02/11/2020  18:21   CT Angio Chest PE W/Cm &/Or Wo Cm  Result Date: 02/12/2020 CLINICAL DATA:  Shortness of breath. EXAM: CT ANGIOGRAPHY CHEST WITH CONTRAST TECHNIQUE: Multidetector CT imaging of the chest was performed using the standard protocol during bolus administration of intravenous contrast. Multiplanar CT image reconstructions and MIPs were obtained to evaluate the vascular anatomy. CONTRAST:  134mL OMNIPAQUE IOHEXOL 350 MG/ML SOLN COMPARISON:  None. FINDINGS: Cardiovascular: Contrast injection is sufficient to demonstrate satisfactory opacification of the pulmonary arteries to the segmental level. There is no pulmonary embolus or evidence of right heart strain. The size of the main pulmonary artery is normal. Heart size is normal, with no pericardial effusion. There are coronary artery calcifications. There are atherosclerotic changes of the thoracic aorta without evidence for an aneurysm there is significant reflux of contrast into the IVC. Mediastinum/Nodes: -- No mediastinal lymphadenopathy. -- No hilar lymphadenopathy. -- No axillary lymphadenopathy. -- No supraclavicular lymphadenopathy. -- Normal thyroid gland where visualized. -  Unremarkable esophagus. Lungs/Pleura: There are emphysematous changes. There is consolidation in the right upper lobe and left lower lobe with areas of bronchial wall thickening and mucus plugging. There is no pneumothorax. There is no significant pleural effusion. Upper Abdomen: Contrast bolus timing is not optimized for evaluation of the abdominal organs. The visualized portions of the organs of the upper abdomen are normal. Musculoskeletal: The patient is status post prior splenectomy. There are few small splenules in left upper quadrant. There is an apparent large ventral wall hernia containing fat. However, this hernia is only partially visualized. Review of the MIP images confirms the above findings. IMPRESSION: 1. No acute pulmonary embolism. 2. Findings concerning for  infectious bronchiolitis (viral or bacterial) with areas of consolidation involving the right upper and left lower lobes. 3. Significant reflux of contrast into the IVC, suggestive of right heart dysfunction. Aortic Atherosclerosis (ICD10-I70.0) and Emphysema (ICD10-J43.9). Electronically Signed   By: Constance Holster M.D.   On: 02/12/2020 01:43   DG CHEST PORT 1 VIEW  Result Date: 02/14/2020 CLINICAL DATA:  Shortness of breath EXAM: PORTABLE CHEST 1 VIEW COMPARISON:  February 11, 2020 FINDINGS: There is airspace opacity in the right upper lobe, new. There is atelectatic change in the left base with minimal left pleural effusion. Heart is borderline enlarged with pulmonary vascularity normal. No adenopathy. No bone lesions. IMPRESSION: Airspace opacity consistent with pneumonia right upper lobe. Stable atelectasis base with minimal left pleural effusion. Borderline cardiomegaly. No adenopathy. Electronically Signed   By: Lowella Grip III M.D.   On: 02/14/2020 08:18       Subjective: No new complaints. Weaned off oxygen.   Discharge Exam: Vitals:   02/16/20 0252 02/16/20 0725  BP: 116/76  Pulse:  (!) 106  Resp: 18 20  Temp: 98 F (36.7 C)   SpO2:  100%   Vitals:   02/15/20 2027 02/16/20 0000 02/16/20 0252 02/16/20 0725  BP:  112/74 116/76   Pulse:    (!) 106  Resp:  18 18 20   Temp:  97.8 F (36.6 C) 98 F (36.7 C)   TempSrc:  Oral Oral   SpO2: 99%   100%  Weight:   67.1 kg   Height:        General: Pt is alert, awake, not in acute distress Cardiovascular: RRR, S1/S2 +, no rubs, no gallops Respiratory: CTA bilaterally, scattered wheezing .  Abdominal: Soft, NT, ND, bowel sounds + Extremities: no edema, no cyanosis    The results of significant diagnostics from this hospitalization (including imaging, microbiology, ancillary and laboratory) are listed below for reference.     Microbiology: Recent Results (from the past 240 hour(s))  SARS-COV-2 RNA,(COVID-19) QUAL  NAAT     Status: None   Collection Time: 02/10/20  9:34 AM   Specimen: Respiratory  Result Value Ref Range Status   SARS CoV2 RNA NOT DETECTED NOT DETECT Final    Comment: . A Not Detected (negative) test result for this test means that SARS-CoV-2 RNA was not present in the specimen above the limit of detection. A negative result does not rule out the possibility of COVID-19 and should not be used as the sole basis for treatment or patient management  decisions.  If COVID-19 is still suspected, based on  exposure history together with other clinical findings, re-testing should be considered in consultation with public health authorities. Laboratory test results should always be considered in the context of clinical  observations and epidemiological data in making a final diagnosis and patient management decisions. . This patient specimen was tested using an FDA EUA pooling method. . Patient specimens with low viral loads may not be detected in sample pools due to the decreased sensitivity of  pooled testing. . Please review the "Fact Sheets" and FDA authorized labeling available for health care providers and patients using the foll owing websites: https://www.[facility].com/home/Covid-19/HCP/NAAT/fact-sheet2  https://www.[facility].com/home/Covid-19/Patients/NAAT/ fact-sheet2 . This test has been authorized by the FDA under an  Emergency Use Authorization (EUA) for use by authorized laboratories. . Due to the current public health emergency, [facility] is receiving a high volume of samples from a wide variety of swabs and media for COVID-19 testing. In order to serve patients during this public health crisis, samples from appropriate clinical sources are  being tested. Negative test results derived from specimens received in non-commercially manufactured viral collection and transport media, or in media and sample collection kits not yet authorized by  FDA for COVID-19 testing should be cautiously evaluated and the patient potentially subjected to extra precautions such as additional clinical monitoring, including collection of an additional specimen. . Methodology:  Nucle ic Acid Amplification Test (NAAT) includes RT-PCR or TMA . Additional information about COVID-19 can be found at the [employer] website: [email or web address].   SARS Coronavirus 2 by RT PCR (hospital order, performed in [facility] hospital lab) Nasopharyngeal Nasopharyngeal Swab     Status: None   Collection Time: 02/12/20  1:08 AM   Specimen: Nasopharyngeal Swab  Result Value Ref Range Status   SARS Coronavirus 2 NEGATIVE NEGATIVE Final    Comment: (NOTE) SARS-CoV-2 target nucleic acids are NOT DETECTED.  The SARS-CoV-2 RNA is generally detectable in upper and lower respiratory specimens during the acute phase  of infection. The lowest concentration of SARS-CoV-2 viral copies this assay can detect is 250 copies / mL. A negative result does not preclude SARS-CoV-2 infection and should not be used as the sole basis for treatment or other patient management decisions.  A negative result may occur with improper specimen collection / handling, submission of specimen other than nasopharyngeal swab, presence of viral mutation(s) within the areas targeted by this assay, and inadequate number of viral copies (<250 copies / mL). A negative result must be combined with clinical observations, patient history, and epidemiological information.  Fact Sheet for Patients:   StrictlyIdeas.no  Fact Sheet for Healthcare Providers: BankingDealers.co.za  This test is not yet approved or  cleared by the [place] FDA and has been authorized for detection and/or diagnosis of SARS-CoV-2 by FDA under an Emergency Use Authorization (EUA).  This EUA will remain in effect (meaning this test can be used) for the  duration of the COVID-19 declaration under Section 564(b)(1) of the Act, 21 U.S.C. section 360bbb-3(b)(1), unless the authorization is terminated or revoked sooner.  Performed at [facility], [place] [address], [place], [place] [address]   Culture, sputum-assessment     Status: None   Collection Time: 02/12/20 10:11 AM   Specimen: Expectorated Sputum  Result Value Ref Range Status   Specimen Description Expect. Sput  Final   Special Requests NONE  Final   Sputum evaluation   Final    THIS SPECIMEN IS ACCEPTABLE FOR SPUTUM CULTURE Performed at [facility], [address], [place], [place] [address]    Report Status 02/12/2020 FINAL  Final  Culture, respiratory     Status: None   Collection Time: 02/12/20 10:11 AM  Result Value Ref Range Status   Specimen Description Expect. Sput  Final   Special Requests NONE Reflexed from [id number]  Final   Gram Stain   Final    RARE WBC PRESENT,BOTH PMN AND MONONUCLEAR RARE GRAM POSITIVE COCCI IN PAIRS RARE GRAM POSITIVE RODS RARE GRAM NEGATIVE RODS    Culture   Final    Consistent with normal respiratory flora. Performed at [facility], [place] [address] [place], [place] [address]    Report Status 02/14/2020 FINAL  Final  C Difficile Quick Screen w PCR reflex     Status: None   Collection Time: 02/12/20  2:43 PM   Specimen: STOOL  Result Value Ref Range Status   C Diff antigen NEGATIVE NEGATIVE Final   C Diff toxin NEGATIVE NEGATIVE Final   C Diff interpretation No C. difficile detected.  Final    Comment: Performed at [facility], [place] [address], [place], [place] [address]     Labs: BNP (last 3 results) Recent Labs    01/13/20 1418 02/12/20 0030  BNP 983.1* 935.7*   Basic Metabolic Panel: Recent Labs  Lab 02/11/20 1703 02/12/20 1007 02/13/20 0644 02/13/20 1103 02/14/20 0449 02/15/20 0448 02/16/20 0433  NA   < >  --  127* 131* 133* 135 135  K   < >  --  3.2* 3.5 4.7 4.0 3.7  CL   < >  --  92* 97*  97* 99 101  CO2   < >  --  23 22 24 23 24   GLUCOSE   < >  --  133* 162* 123* 131* 113*  BUN   < >  --  11 11 13 18 23   CREATININE   < >  --  0.82 0.90 0.88 0.95 0.92  CALCIUM   < >  --  7.8* 8.2* 8.2* 8.4* 8.3*  MG  --  1.3* 2.3  --  2.1  --   --    < > = values in this interval not displayed.   Liver Function Tests: Recent Labs  Lab 02/12/20 0030  AST 31  ALT 33  ALKPHOS 59  BILITOT 0.6  PROT 5.7*  ALBUMIN 3.1*   Recent Labs  Lab 02/11/20 1703  LIPASE 20   No results for input(s): AMMONIA in the last 168 hours. CBC: Recent Labs  Lab 02/11/20 1703 02/13/20 0644 02/14/20 0449 02/15/20 0448 02/16/20 0433  WBC 17.2* 15.1* 15.3* 14.3* 11.9*  NEUTROABS  --  12.0* 12.5* 11.5* 8.9*  HGB 13.0 12.3 12.2 12.2 12.1  HCT 40.0 36.4 37.3 36.9 36.8  MCV 97.3 95.5 97.1 97.4 97.4  PLT 388 330 349 360 343   Cardiac Enzymes: No results for input(s): CKTOTAL, CKMB, CKMBINDEX, TROPONINI in the last 168 hours. BNP: Invalid input(s): POCBNP CBG: No results for input(s): GLUCAP in the last 168 hours. D-Dimer No results for input(s): DDIMER in the last 72 hours. Hgb A1c No results for input(s): HGBA1C in the last 72 hours. Lipid Profile No results for input(s): CHOL, HDL, LDLCALC, TRIG, CHOLHDL, LDLDIRECT in the last 72 hours. Thyroid function studies No results for input(s): TSH, T4TOTAL, T3FREE, THYROIDAB in the last 72 hours.  Invalid input(s): FREET3 Anemia work up No results for input(s): VITAMINB12, FOLATE, FERRITIN, TIBC, IRON, RETICCTPCT in the last 72 hours. Urinalysis    Component Value Date/Time   COLORURINE STRAW (A) 01/13/2017 2028   APPEARANCEUR CLEAR 01/13/2017 2028   LABSPEC 1.012 01/13/2017 2028   PHURINE 5.0 01/13/2017 2028   GLUCOSEU NEGATIVE 01/13/2017 2028   HGBUR NEGATIVE 01/13/2017 2028   BILIRUBINUR NEGATIVE 01/13/2017 2028   KETONESUR 5 (A) 01/13/2017 2028   PROTEINUR NEGATIVE 01/13/2017 2028   NITRITE NEGATIVE 01/13/2017 2028   LEUKOCYTESUR  NEGATIVE 01/13/2017 2028   Sepsis Labs Invalid input(s): PROCALCITONIN,  WBC,  LACTICIDVEN Microbiology Recent Results (from the past 240 hour(s))  SARS-COV-2 RNA,(COVID-19) QUAL NAAT     Status: None   Collection Time: 02/10/20  9:34 AM   Specimen: Respiratory  Result Value Ref Range Status   SARS CoV2 RNA NOT DETECTED NOT DETECT Final    Comment: . A Not Detected (negative) test result for this test means that SARS-CoV-2 RNA was not present in the specimen above the limit of detection. A negative result does not rule out the possibility of COVID-19 and should not be used as the sole basis for treatment or patient management  decisions.  If COVID-19 is still suspected, based on  exposure history together with other clinical findings, re-testing should be considered in consultation with public health authorities. Laboratory test results should always be considered in the context of clinical  observations and epidemiological data in making a final diagnosis and patient management decisions. . This patient specimen was tested using an FDA EUA pooling method. . Patient specimens with low viral loads may not be detected in sample pools due to the decreased sensitivity of  pooled testing. . Please review the "Fact Sheets" and FDA authorized labeling available for health care providers and patients using the foll owing websites: https://www.[facility].com/home/Covid-19/HCP/NAAT/fact-sheet2  https://www.[email or web address]/home/Covid-19/Patients/NAAT/ fact-sheet2 . This test has been authorized by the FDA under an  Emergency Use Authorization (EUA) for use by authorized laboratories. . Due to the current public health emergency,  [facility] is receiving a high volume of samples from a wide variety of swabs and media for COVID-19 testing. In order to serve patients during this public health crisis, samples from appropriate clinical sources are  being tested.  Negative test results derived from specimens received in non-commercially manufactured viral collection and transport media, or in media and sample collection kits not yet authorized by FDA for COVID-19 testing should be cautiously evaluated and the patient potentially subjected to extra precautions such as additional clinical monitoring, including collection of an additional specimen. . Methodology:  Nucle ic Acid Amplification Test (NAAT) includes RT-PCR or TMA . Additional information about COVID-19 can be found at the [employer] website: www.[facility].com/Covid19.   SARS Coronavirus 2 by RT PCR (hospital order, performed in [facility] hospital lab) Nasopharyngeal Nasopharyngeal Swab     Status: None   Collection Time: 02/12/20  1:08 AM   Specimen: Nasopharyngeal Swab  Result Value Ref Range Status   SARS Coronavirus 2 NEGATIVE NEGATIVE Final    Comment: (NOTE) SARS-CoV-2 target nucleic acids are NOT DETECTED.  The SARS-CoV-2 RNA is generally detectable in upper and lower respiratory specimens during the acute phase of infection. The lowest concentration of SARS-CoV-2 viral copies this assay can detect is 250 copies / mL. A negative result does not preclude SARS-CoV-2 infection and should not be used as the sole basis for treatment or other patient management decisions.  A negative result may occur with improper specimen collection / handling, submission of specimen other than nasopharyngeal swab, presence of viral mutation(s) within the areas targeted by this assay, and inadequate number of viral copies (<250 copies / mL). A negative result must be combined with clinical observations, patient history, and epidemiological information.  Fact Sheet for Patients:   StrictlyIdeas.no  Fact Sheet for Healthcare Providers: BankingDealers.co.za  This test is not yet approved or  cleared by the [place] FDA and has  been authorized for detection and/or diagnosis of SARS-CoV-2 by FDA under an Emergency Use Authorization (EUA).  This EUA will remain in effect (meaning this test can be used) for the duration of the COVID-19 declaration under Section 564(b)(1) of the Act, 21 U.S.C. section 360bbb-3(b)(1), unless the authorization is terminated or revoked sooner.  Performed at [facility], [place] [address], [place], [place] [address]   Culture, sputum-assessment     Status: None   Collection Time: 02/12/20 10:11 AM   Specimen: Expectorated Sputum  Result Value Ref Range Status   Specimen Description Expect. Sput  Final   Special Requests NONE  Final   Sputum evaluation   Final    THIS SPECIMEN IS ACCEPTABLE FOR SPUTUM CULTURE Performed at [facility], [address] [place], [place] [address]    Report Status 02/12/2020 FINAL  Final  Culture, respiratory     Status: None   Collection Time: 02/12/20 10:11 AM  Result Value Ref Range Status   Specimen Description Expect. Sput  Final   Special Requests NONE Reflexed from [id number]  Final   Gram Stain   Final    RARE WBC PRESENT,BOTH PMN AND MONONUCLEAR RARE GRAM POSITIVE COCCI IN PAIRS RARE GRAM POSITIVE RODS RARE GRAM NEGATIVE RODS    Culture   Final    Consistent with normal respiratory flora. Performed at [facility], [place] [address], [place], [place] [address]    Report Status 02/14/2020 FINAL  Final  C Difficile Quick Screen w PCR reflex     Status:  None   Collection Time: 02/12/20  2:43 PM   Specimen: STOOL  Result Value Ref Range Status   C Diff antigen NEGATIVE NEGATIVE Final   C Diff toxin NEGATIVE NEGATIVE Final   C Diff interpretation No C. difficile detected.  Final    Comment: Performed at [facility], [place] [address], [place], [place] [address]     Time coordinating discharge: 36 minutes  SIGNED:   Hosie Poisson, MD  [facility] 02/16/2020, 2:42 PM

## 2020-02-16 NOTE — Telephone Encounter (Signed)
Patient has TOC appt on 6/25 with Sande Rives, requested by Fabienne Bruns.

## 2020-02-16 NOTE — Telephone Encounter (Signed)
Furth, Cadence H, PA-C  P Cv Div Nl Toc Pt will be discharged today or tomorrow. We saw her for afib in the setting of PNA. She is a Patent attorney pt but I was unable to get a TOC visit at [facility] so I made an apt at [place] with [facility] who saw her on initial exam. Callie knows and the patient knows. Please arrange TOC call.   Thanks

## 2020-02-17 NOTE — Telephone Encounter (Signed)
Patient contacted regarding discharge from [facility] on 06/16.  Patient understands to follow up with provider Sande Rives, PA-C on 06/22 at 2:30 at [facility]. Patient understands discharge instructions? Yes Patient understands medications and regiment? Yes Patient understands to bring all medications to this visit? Yes

## 2020-02-20 NOTE — H&P (View-Only) (Signed)
Cardiology Office Note:    Date:  02/25/2020   ID:  Erin Wong, DOB Mar 21, 1951, MRN [id number]  PCP:  [name], MD  Cardiologist:  Jenkins Rouge, MD  Electrophysiologist:  None   Referring MD: [name], MD   Chief Complaint: hospital follow-up for atrial fibrillation and CHF  History of Present Illness:    Erin Wong is a 69 y.o. 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 Johnsie Cancel and presents today for hospital follow-up.  Patient recently admitted from 01/13/2020 to 01/19/2020 for atrial fibrillation with RVR and new onset systolic CHF. Echo showed LVEF of 30-35% with global hypokinesis. 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 She was started on IV Cardizem and underwent TEE/DCCV on 01/17/2020. Unfortunately, she promptly reverted back to atrial fibrillation so was started on Amiodarone with plans to repeat cardioversion in 4 weeks after adequate Amiodarone load. She also was diuresed and started on GDMT for CHF. She was discharged on Amiodarone, Coreg, Entresto, and Eliquis. She was seen by Truitt Merle, NP, for follow-up on 01/26/2020 at which time she wad doing well and was not aware of her atrial fibrillation. She was tolerating medications well.  Patient recently admitted from 02/11/2020 to 02/16/2020 for acute hypoxic respiratory failure felt to be secondary to likely pneumonia and underlying asthma and CHF. She was treated with antibiotics and steroids as well as IV Lasix. Rates remained elevated due to respiratory condition so Coreg was increased and Digoxin was started in the short-term. Initial plan was to try for DCCV during admission but patient preferred to wait until after respiratory status has  improved. Per last Cardiology note, it was felt that Digoxin could likely be discontinued and DCCV arranged at follow-up visit.   Patient presents today for follow-up. Here with husband. Patient has done well since discharge. Shortness of breath has resolved. No chest pain, orthopnea, PND, palpitations, lightheadedness, dizziness, or syncope/near syncope. She does have some lower extremity edema, worse on the right leg because this is her bad leg that she has had lots of surgeries on. No calf pain or signs of DVT. Weight stable. No hematochezia, melena, or hematuria; however, patient's Cologuard did come back positive. She also notes increased bowel movement since being in the hospital and is concerned that it may be one of her medications.   Past Medical History:  Diagnosis Date  . Allergy   . Arthritis   . Asthma   . Atrial fibrillation ([place])   . Benign essential HTN   . CHF (congestive heart failure) ([place])   . CKD (chronic kidney disease) stage 3, GFR 30-59 ml/min   . HLD (hyperlipidemia)     Past Surgical History:  Procedure Laterality Date  . ABDOMINAL HYSTERECTOMY  10/04/1995   Hysterectomy and Bilateral oophorectomy  . CARDIOVERSION N/A 01/17/2020   Procedure: CARDIOVERSION;  Surgeon: [name], MD;  Location: [facility] ENDOSCOPY;  Service: Cardiovascular;  Laterality: N/A;  . COLON SURGERY  09/2009   hole in colon  . FRACTURE SURGERY Right 08/02/2011   Right Leg--Plates & Screws  . 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 TEE WITHOUT CARDIOVERSION N/A 01/17/2020   Procedure: TRANSESOPHAGEAL ECHOCARDIOGRAM (TEE);  Surgeon: [name], MD;  Location: [facility] ENDOSCOPY;  Service: Cardiovascular;  Laterality: N/A;    Current Medications: Current Meds  Medication Sig  . acetaminophen (TYLENOL) 650 MG CR tablet Take 2 tablets (1,300 mg total) by mouth 2 (two) times daily as needed for pain.    Marland Kitchen albuterol (PROVENTIL HFA;VENTOLIN HFA) 108 (90 Base) MCG/ACT inhaler Inhale 1 puff into the lungs every 6 (six) hours as needed for wheezing or shortness of breath.  Marland Kitchen amiodarone (PACERONE) 200 MG tablet Take 1 tablet (200mg ) twice daily (Patient taking differently: Take 200 mg by mouth 2 (two) times daily. )  . apixaban (ELIQUIS) 5 MG TABS tablet Take 1 tablet (5 mg total) by mouth 2 (two) times daily.  Marland Kitchen atorvastatin (LIPITOR) 40 MG tablet TAKE 1 TABLET BY MOUTH EVERY DAY (Patient taking differently: Take 40 mg by mouth daily. )  . benzonatate (TESSALON) 100 MG capsule Take 1 capsule (100 mg total) by mouth 3 (three) times daily as needed for cough.  . calcium gluconate 500 MG tablet Take 1 tablet by mouth daily.  . carvedilol (COREG) 25 MG tablet Take 1 tablet (25 mg total) by mouth 2 (two) times daily with a meal.  . cholecalciferol (VITAMIN D) 1000 units tablet Take 1 tablet (1,000 Units total) by mouth daily.  Marland Kitchen dextromethorphan-guaiFENesin (MUCINEX DM) 30-600 MG 12hr tablet Take 1 tablet by mouth 2 (two) times daily.  . fluticasone (FLONASE) 50 MCG/ACT nasal spray Place 2 sprays into both nostrils daily.  . Fluticasone-Salmeterol (ADVAIR DISKUS) 100-50 MCG/DOSE AEPB INHALE 1 PUFF INTO THE LUNGS TWICE A DAY (Patient taking differently: Inhale 1 puff into the lungs in the morning and at bedtime. )  . furosemide (LASIX) 40 MG tablet Take 1 tablet (40 mg total) by mouth daily.  . Investigational - Study Medication Study - DAPA TIMI 68 - dapagliflozin (FARXIGA) 10 mg or placebo tablet (PI-Dalton McLean): 1 tablet, Oral, Daily, First dose (after last modification) on Tue 02/15/20 at 1100  . omeprazole (PRILOSEC) 20 MG capsule TAKE 1 CAPSULE BY MOUTH EVERY DAY (Patient taking differently: Take 20 mg by mouth daily. )  . polyethylene glycol (MIRALAX / GLYCOLAX) 17 g packet Take 17 g by mouth daily.  . predniSONE (DELTASONE) 20 MG tablet Prednisone 40 mg daily for 3 days followed by  Prednisone 20  mg daily for 3 days.  . sacubitril-valsartan (ENTRESTO) 24-26 MG Take 1 tablet by mouth 2 (two) times daily.  . traMADol (ULTRAM) 50 MG tablet TAKE 2 TABLETS BY MOUTH 3 TIMES A DAY AS NEEDED FOR PAIN (Patient taking differently: Take 100 mg by mouth 3 (three) times daily as needed for moderate pain. )  . [DISCONTINUED] digoxin (LANOXIN) 0.125 MG tablet Take 1 tablet (0.125 mg total) by mouth daily.  . [DISCONTINUED] furosemide (LASIX) 20 MG tablet Take 1 tablet (20 mg total) by mouth daily.     Allergies:   Patient has no known allergies.   Social History   Socioeconomic History  . Marital status: Married    Spouse name: Not on file  . Number of children: Not on file  . Years of education: Not on file  . Highest education level: Not on file  Occupational History  . Not on file  Tobacco Use  . Smoking status: Former Smoker    Quit date: 01/10/1991    Years since quitting: 29.1  . Smokeless tobacco: Never Used  Vaping Use  . Vaping Use: Never used  Substance and Sexual  Activity  . Alcohol use: No  . Drug use: No  . Sexual activity: Never  Other Topics Concern  . Not on file  Social History Narrative  . Not on file   Social Determinants of Health   Financial Resource Strain:   . Difficulty of Paying Living Expenses:   Food Insecurity:   . Worried About Charity fundraiser in the Last Year:   . Arboriculturist in the Last Year:   Transportation Needs:   . Film/video editor (Medical):   Marland Kitchen Lack of Transportation (Non-Medical):   Physical Activity:   . Days of Exercise per Week:   . Minutes of Exercise per Session:   Stress:   . Feeling of Stress :   Social Connections:   . Frequency of Communication with Friends and Family:   . Frequency of Social Gatherings with Friends and Family:   . Attends Religious Services:   . Active Member of Clubs or Organizations:   . Attends Archivist Meetings:   Marland Kitchen Marital Status:      Family History: The patient's  family history includes Alcohol abuse in her brother and father; Arthritis in her mother; Asthma in her maternal aunt; COPD in her brother; Cancer in her mother; Diabetes in her maternal aunt; Miscarriages / Stillbirths in her mother.  ROS:   Please see the history of present illness.     EKGs/Labs/Other Studies Reviewed:    The following studies were reviewed today:  Echocardiogram 01/14/2020: Impressions: 1. Left ventricular ejection fraction, by estimation, is 30 to 35%. The  left ventricle has moderately decreased function. The left ventricle  demonstrates global hypokinesis. The left ventricular internal cavity size  was mildly dilated. Left ventricular  diastolic parameters are indeterminate. There is akinesis of the left  ventricular, mid-apical inferior wall, anterior wall, inferolateral wall,  anterolateral wall and lateral wall. There is akinesis of the left  ventricular, apical septal wall and apical  segment.  2. Right ventricular systolic function is mildly reduced. The right  ventricular size is mildly enlarged. There is moderately elevated  pulmonary artery systolic pressure. The estimated right ventricular  systolic pressure is 89.3 mmHg.  3. Left atrial size was severely dilated.  4. The mitral valve is normal in structure. Mild to moderate mitral valve  regurgitation. No evidence of mitral stenosis.  5. Tricuspid valve regurgitation is mild to moderate.  6. The aortic valve is normal in structure. Aortic valve regurgitation is  trivial. No aortic stenosis is present.  7. The inferior vena cava is dilated in size with <50% respiratory  variability, suggesting right atrial pressure of 15 mmHg.   EKG:  EKG ordered today. EKG personally reviewed and demonstrates atrial fibrillation, rate 68 bpm, with a lot of underlying artifact but does look like ST depression in leads V3-V4. Normal axis. QTc 365 ms.   Recent Labs: 01/13/2020: TSH 1.525 02/12/2020: B  Natriuretic Peptide 602.0 02/14/2020: Magnesium 2.1 02/22/2020: ALT 32; Hemoglobin 13.0; Platelets 475 02/23/2020: BUN 16; Creatinine, Ser 0.93; Potassium 4.0; Sodium 135  Recent Lipid Panel    Component Value Date/Time   CHOL 139 01/14/2020 0601   TRIG 58 01/14/2020 0601   HDL 70 01/14/2020 0601   CHOLHDL 2.0 01/14/2020 0601   VLDL 12 01/14/2020 0601   LDLCALC 57 01/14/2020 0601   LDLCALC 51 01/03/2020 1548    Physical Exam:    Vital Signs: BP 122/66   Pulse 85   Ht 5\' 4"  (1.626  m)   Wt 146 lb 9.6 oz (66.5 kg)   SpO2 95%   BMI 25.16 kg/m     Wt Readings from Last 3 Encounters:  02/25/20 146 lb 9.6 oz (66.5 kg)  02/23/20 147 lb 11.2 oz (67 kg)  02/22/20 143 lb (64.9 kg)     General: 69 y.o. female in no acute distress. HEENT: Normocephalic and atraumatic. Sclera clear. EOMs intact. Neck: Supple. No carotid bruits. No JVD. Heart: Irregular rhythm with normal rate. Distinct S1 and S2. No murmurs, gallops, or rubs. Radial pulses 2+ and equal bilaterally. Lungs: No increased work of breathing. Very faint crackles noted in left base. No wheezes or rhonchi. Abdomen: Soft, non-distended, and non-tender to palpation.  Extremities: 1-2+ bilateral lower extremity edema, mostly of feet and ankles (right > left)  Skin: Warm and dry. Neuro: Alert and oriented x3. No focal deficits. Psych: Normal affect. Responds appropriately.  Assessment:    1. Persistent atrial fibrillation ([place])   2. Chronic systolic CHF (congestive heart failure) ([place])   3. Essential hypertension   4. Hyperlipidemia, unspecified hyperlipidemia type   5. Stage 3 chronic kidney disease, unspecified whether stage 3a or 3b CKD   6. Pre-procedure lab exam   7. Medication management     Plan:    Persistent Atrial Fibrillation with RVR - Failed DCCV in 01/2020 and was started on Amiodarone with plans for repeat DCCV after adequate load.  - Still in atrial fibrillation today. Rates controlled. - LVEF of 30-35%  on Echo in 01/2020.  - Continue Coreg 25mg  twice daily.  - Continue Amiodarone 200mg  daily. - Will stop Digoxin per last Cardiology note in the hospital given rates are controlled. - Continue Eliquis 5mg  twice daily. She has not missed any doses since discharge. - Will arrange DCCV. Scheduled for 03/09/2020 with Dr. Audie Box. Will check pre-procedural labs (CBC and BMET) in 1 week. Discussed procedure with her and she agrees to proceed.  Chronic Systolic CHF - BNP elevated in the 600's.  - TEE in 01/2020 showed LVEF of 30-35% with global hypokinesis. RV also moderately enlarged with moderated reduced systolic function. Also noted to have moderate to severe MR and moderate TR. - Patient has some lower extremity edema on exam but otherwise appears euvolemic.  - Will increase Lasix to 40mg  daily. - Continue Entresto 24-26mg  twice daily. Will continue current dose given BP has been soft at times.  - Continue Coreg 25mg  twice daily. 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 Patient will need repeat Echo after restoration of sinus rhythm. If EF has not improved, will need ischemic work-up. She denies any chest pain. - Discussed importance of daily weights and sodium/fluid restrictions.  - Will recheck BMET in 1 week.  Hypertension - BP well controlled. - Continue Entresto and Coreg as above.  Hyperlipidemia - Continue Lipitor 40mg  daily.  CKD Stage III - Creatinine 0.93 on 02/23/2020.  - Will check in 1 week after increasing Lasix.  Pre-Op - Patient had positive Cologuard and has been referred to GI for colonoscopy.  - Discussed that patient will need to wait at least 4 weeks after cardioversion before being able to come off Eliquis for colonoscopy. She understand and agrees with proceeding with cardioversion. - Patient able to complete 4.0 METS without any anginal symptoms. Will follow-up with her after DCCV. If still stable from a cardiac standpoint at that time, would be OK to proceed with  colonoscopy.   Disposition: Follow up 2 weeks after DCCV.    Medication  Adjustments/Labs and Tests Ordered: Current medicines are reviewed at length with the patient today.  Concerns regarding medicines are outlined above.  Orders Placed This Encounter  Procedures  . ELECTRICAL CARDIOVERSION  . CBC  . Basic metabolic panel  . EKG 12-Lead   Meds ordered this encounter  Medications  . furosemide (LASIX) 40 MG tablet    Sig: Take 1 tablet (40 mg total) by mouth daily.    Dispense:  90 tablet    Refill:  3    Patient Instructions  Medication Instructions:  INCREASE lasix to 40mg  daily STOP digoxin  *If you need a refill on your cardiac medications before your next appointment, please call your pharmacy*   Lab Work: CBC & BMET in 1 week - Friday July 2 @ any [facility] convenient for you  If you have labs (blood work) drawn today and your tests are completely normal, you will receive your results only by: Marland Kitchen MyChart Message (if you have MyChart) OR . A paper copy in the mail If you have any lab test that is abnormal or we need to change your treatment, we will call you to review the results.   Testing/Procedures: Cardioversion @ [facility] Thursday July 8 with Dr. Eleonore Chiquito  You will need to have the coronavirus test completed prior to your procedure. An appointment has been made at 2:45pm on Tuesday July 6. This is a Drive Up Visit at the [facility] [address] Please tell them that you are there for pre-procedure testing. Someone will direct you to the appropriate testing line. Stay in your car and someone will be with you shortly. Please make sure to have all other labs completed before this test because you will need to stay quarantined until your procedure. Please take your insurance card to this test.     Follow-Up: At [facility], you and your health needs are our priority.  As part of our continuing mission to provide you with exceptional  heart care, we have created designated Provider Care Teams.  These Care Teams include your primary Cardiologist (physician) and Advanced Practice Providers (APPs -  Physician Assistants and Nurse Practitioners) who all work together to provide you with the care you need, when you need it.  We recommend signing up for the patient portal called "MyChart".  Sign up information is provided on this After Visit Summary.  MyChart is used to connect with patients for Virtual Visits (Telemedicine).  Patients are able to view lab/test results, encounter notes, upcoming appointments, etc.  Non-urgent messages can be sent to your provider as well.   To learn more about what you can do with MyChart, go to [email or web address].    Your next appointment:   Approximately week of July 26 (about 3 weeks post-cardioversion)  The format for your next appointment:   In Person  Provider:   You may see Jenkins Rouge, MD or one of the following Advanced Practice Providers on your designated Care Team:    Truitt Merle, NP  Cecilie Kicks, NP  Kathyrn Drown, NP    Other Instructions   You are scheduled for a Cardioversion on Thursday July 8 with Dr. Eleonore Chiquito.  Please arrive at the [facility] (Main Entrance A) at [facility]: [place], [place] [address] at [place].   DIET: Nothing to eat or drink after midnight except a sip of water with medications (see medication instructions below)  Medication Instructions: Hold N/A  Continue your  anticoagulant: Eliquis You will need to continue your anticoagulant after your procedure until you are told by your provider that it is safe to stop   Labs: Friday July 2 @ any [facility] convenient for you   You must have a responsible person to drive you home and stay in the waiting area during your procedure. Failure to do so could result in cancellation.  Bring your insurance cards.  *Special Note: Every effort is made to have your procedure  done on time. Occasionally there are emergencies that occur at the hospital that may cause delays. Please be patient if a delay does occur.       Signed, Darreld Mclean, PA-C  02/25/2020 3:09 PM    [facility]

## 2020-02-20 NOTE — Progress Notes (Addendum)
Cardiology Office Note:    Date:  02/25/2020   ID:  Erin Wong, DOB 04-Jul-1951, MRN [id number]  PCP:  [name], MD  Cardiologist:  Jenkins Rouge, MD  Electrophysiologist:  None   Referring MD: [name], MD   Chief Complaint: hospital follow-up for atrial fibrillation and CHF  History of Present Illness:    Erin Wong is a 69 y.o. 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 Johnsie Cancel and presents today for hospital follow-up.  Patient recently admitted from 01/13/2020 to 01/19/2020 for atrial fibrillation with RVR and new onset systolic CHF. Echo showed LVEF of 30-35% with global hypokinesis. 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 She was started on IV Cardizem and underwent TEE/DCCV on 01/17/2020. Unfortunately, she promptly reverted back to atrial fibrillation so was started on Amiodarone with plans to repeat cardioversion in 4 weeks after adequate Amiodarone load. She also was diuresed and started on GDMT for CHF. She was discharged on Amiodarone, Coreg, Entresto, and Eliquis. She was seen by Truitt Merle, NP, for follow-up on 01/26/2020 at which time she wad doing well and was not aware of her atrial fibrillation. She was tolerating medications well.  Patient recently admitted from 02/11/2020 to 02/16/2020 for acute hypoxic respiratory failure felt to be secondary to likely pneumonia and underlying asthma and CHF. She was treated with antibiotics and steroids as well as IV Lasix. Rates remained elevated due to respiratory condition so Coreg was increased and Digoxin was started in the short-term. Initial plan was to try for DCCV during admission but patient preferred to wait until after respiratory status has  improved. Per last Cardiology note, it was felt that Digoxin could likely be discontinued and DCCV arranged at follow-up visit.   Patient presents today for follow-up. Here with husband. Patient has done well since discharge. Shortness of breath has resolved. No chest pain, orthopnea, PND, palpitations, lightheadedness, dizziness, or syncope/near syncope. She does have some lower extremity edema, worse on the right leg because this is her bad leg that she has had lots of surgeries on. No calf pain or signs of DVT. Weight stable. No hematochezia, melena, or hematuria; however, patient's Cologuard did come back positive. She also notes increased bowel movement since being in the hospital and is concerned that it may be one of her medications.   Past Medical History:  Diagnosis Date  . Allergy   . Arthritis   . Asthma   . Atrial fibrillation ([place])   . Benign essential HTN   . CHF (congestive heart failure) ([place])   . CKD (chronic kidney disease) stage 3, GFR 30-59 ml/min   . HLD (hyperlipidemia)     Past Surgical History:  Procedure Laterality Date  . ABDOMINAL HYSTERECTOMY  10/04/1995   Hysterectomy and Bilateral oophorectomy  . CARDIOVERSION N/A 01/17/2020   Procedure: CARDIOVERSION;  Surgeon: [name], MD;  Location: [facility] ENDOSCOPY;  Service: Cardiovascular;  Laterality: N/A;  . COLON SURGERY  09/2009   hole in colon  . FRACTURE SURGERY Right 08/02/2011   Right Leg--Plates & Screws  . 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 TEE WITHOUT CARDIOVERSION N/A 01/17/2020   Procedure: TRANSESOPHAGEAL ECHOCARDIOGRAM (TEE);  Surgeon: [name], MD;  Location: [facility] ENDOSCOPY;  Service: Cardiovascular;  Laterality: N/A;    Current Medications: Current Meds  Medication Sig  . acetaminophen (TYLENOL) 650 MG CR tablet Take 2 tablets (1,300 mg total) by mouth 2 (two) times daily as needed for pain.    Marland Kitchen albuterol (PROVENTIL HFA;VENTOLIN HFA) 108 (90 Base) MCG/ACT inhaler Inhale 1 puff into the lungs every 6 (six) hours as needed for wheezing or shortness of breath.  Marland Kitchen amiodarone (PACERONE) 200 MG tablet Take 1 tablet (200mg ) twice daily (Patient taking differently: Take 200 mg by mouth 2 (two) times daily. )  . apixaban (ELIQUIS) 5 MG TABS tablet Take 1 tablet (5 mg total) by mouth 2 (two) times daily.  Marland Kitchen atorvastatin (LIPITOR) 40 MG tablet TAKE 1 TABLET BY MOUTH EVERY DAY (Patient taking differently: Take 40 mg by mouth daily. )  . benzonatate (TESSALON) 100 MG capsule Take 1 capsule (100 mg total) by mouth 3 (three) times daily as needed for cough.  . calcium gluconate 500 MG tablet Take 1 tablet by mouth daily.  . carvedilol (COREG) 25 MG tablet Take 1 tablet (25 mg total) by mouth 2 (two) times daily with a meal.  . cholecalciferol (VITAMIN D) 1000 units tablet Take 1 tablet (1,000 Units total) by mouth daily.  Marland Kitchen dextromethorphan-guaiFENesin (MUCINEX DM) 30-600 MG 12hr tablet Take 1 tablet by mouth 2 (two) times daily.  . fluticasone (FLONASE) 50 MCG/ACT nasal spray Place 2 sprays into both nostrils daily.  . Fluticasone-Salmeterol (ADVAIR DISKUS) 100-50 MCG/DOSE AEPB INHALE 1 PUFF INTO THE LUNGS TWICE A DAY (Patient taking differently: Inhale 1 puff into the lungs in the morning and at bedtime. )  . furosemide (LASIX) 40 MG tablet Take 1 tablet (40 mg total) by mouth daily.  . Investigational - Study Medication Study - DAPA TIMI 68 - dapagliflozin (FARXIGA) 10 mg or placebo tablet (PI-Dalton McLean): 1 tablet, Oral, Daily, First dose (after last modification) on Tue 02/15/20 at 1100  . omeprazole (PRILOSEC) 20 MG capsule TAKE 1 CAPSULE BY MOUTH EVERY DAY (Patient taking differently: Take 20 mg by mouth daily. )  . polyethylene glycol (MIRALAX / GLYCOLAX) 17 g packet Take 17 g by mouth daily.  . predniSONE (DELTASONE) 20 MG tablet Prednisone 40 mg daily for 3 days followed by  Prednisone 20  mg daily for 3 days.  . sacubitril-valsartan (ENTRESTO) 24-26 MG Take 1 tablet by mouth 2 (two) times daily.  . traMADol (ULTRAM) 50 MG tablet TAKE 2 TABLETS BY MOUTH 3 TIMES A DAY AS NEEDED FOR PAIN (Patient taking differently: Take 100 mg by mouth 3 (three) times daily as needed for moderate pain. )  . [DISCONTINUED] digoxin (LANOXIN) 0.125 MG tablet Take 1 tablet (0.125 mg total) by mouth daily.  . [DISCONTINUED] furosemide (LASIX) 20 MG tablet Take 1 tablet (20 mg total) by mouth daily.     Allergies:   Patient has no known allergies.   Social History   Socioeconomic History  . Marital status: Married    Spouse name: Not on file  . Number of children: Not on file  . Years of education: Not on file  . Highest education level: Not on file  Occupational History  . Not on file  Tobacco Use  . Smoking status: Former Smoker    Quit date: 01/10/1991    Years since quitting: 29.1  . Smokeless tobacco: Never Used  Vaping Use  . Vaping Use: Never used  Substance and Sexual  Activity  . Alcohol use: No  . Drug use: No  . Sexual activity: Never  Other Topics Concern  . Not on file  Social History Narrative  . Not on file   Social Determinants of Health   Financial Resource Strain:   . Difficulty of Paying Living Expenses:   Food Insecurity:   . Worried About Charity fundraiser in the Last Year:   . Arboriculturist in the Last Year:   Transportation Needs:   . Film/video editor (Medical):   Marland Kitchen Lack of Transportation (Non-Medical):   Physical Activity:   . Days of Exercise per Week:   . Minutes of Exercise per Session:   Stress:   . Feeling of Stress :   Social Connections:   . Frequency of Communication with Friends and Family:   . Frequency of Social Gatherings with Friends and Family:   . Attends Religious Services:   . Active Member of Clubs or Organizations:   . Attends Archivist Meetings:   Marland Kitchen Marital Status:      Family History: The patient's  family history includes Alcohol abuse in her brother and father; Arthritis in her mother; Asthma in her maternal aunt; COPD in her brother; Cancer in her mother; Diabetes in her maternal aunt; Miscarriages / Stillbirths in her mother.  ROS:   Please see the history of present illness.     EKGs/Labs/Other Studies Reviewed:    The following studies were reviewed today:  Echocardiogram 01/14/2020: Impressions: 1. Left ventricular ejection fraction, by estimation, is 30 to 35%. The  left ventricle has moderately decreased function. The left ventricle  demonstrates global hypokinesis. The left ventricular internal cavity size  was mildly dilated. Left ventricular  diastolic parameters are indeterminate. There is akinesis of the left  ventricular, mid-apical inferior wall, anterior wall, inferolateral wall,  anterolateral wall and lateral wall. There is akinesis of the left  ventricular, apical septal wall and apical  segment.  2. Right ventricular systolic function is mildly reduced. The right  ventricular size is mildly enlarged. There is moderately elevated  pulmonary artery systolic pressure. The estimated right ventricular  systolic pressure is 01.6 mmHg.  3. Left atrial size was severely dilated.  4. The mitral valve is normal in structure. Mild to moderate mitral valve  regurgitation. No evidence of mitral stenosis.  5. Tricuspid valve regurgitation is mild to moderate.  6. The aortic valve is normal in structure. Aortic valve regurgitation is  trivial. No aortic stenosis is present.  7. The inferior vena cava is dilated in size with <50% respiratory  variability, suggesting right atrial pressure of 15 mmHg.   EKG:  EKG ordered today. EKG personally reviewed and demonstrates atrial fibrillation, rate 68 bpm, with a lot of underlying artifact but does look like ST depression in leads V3-V4. Normal axis. QTc 365 ms.   Recent Labs: 01/13/2020: TSH 1.525 02/12/2020: B  Natriuretic Peptide 602.0 02/14/2020: Magnesium 2.1 02/22/2020: ALT 32; Hemoglobin 13.0; Platelets 475 02/23/2020: BUN 16; Creatinine, Ser 0.93; Potassium 4.0; Sodium 135  Recent Lipid Panel    Component Value Date/Time   CHOL 139 01/14/2020 0601   TRIG 58 01/14/2020 0601   HDL 70 01/14/2020 0601   CHOLHDL 2.0 01/14/2020 0601   VLDL 12 01/14/2020 0601   LDLCALC 57 01/14/2020 0601   LDLCALC 51 01/03/2020 1548    Physical Exam:    Vital Signs: BP 122/66   Pulse 85   Ht 5\' 4"  (1.626  m)   Wt 146 lb 9.6 oz (66.5 kg)   SpO2 95%   BMI 25.16 kg/m     Wt Readings from Last 3 Encounters:  02/25/20 146 lb 9.6 oz (66.5 kg)  02/23/20 147 lb 11.2 oz (67 kg)  02/22/20 143 lb (64.9 kg)     General: 69 y.o. female in no acute distress. HEENT: Normocephalic and atraumatic. Sclera clear. EOMs intact. Neck: Supple. No carotid bruits. No JVD. Heart: Irregular rhythm with normal rate. Distinct S1 and S2. No murmurs, gallops, or rubs. Radial pulses 2+ and equal bilaterally. Lungs: No increased work of breathing. Very faint crackles noted in left base. No wheezes or rhonchi. Abdomen: Soft, non-distended, and non-tender to palpation.  Extremities: 1-2+ bilateral lower extremity edema, mostly of feet and ankles (right > left)  Skin: Warm and dry. Neuro: Alert and oriented x3. No focal deficits. Psych: Normal affect. Responds appropriately.  Assessment:    1. Persistent atrial fibrillation ([place])   2. Chronic systolic CHF (congestive heart failure) ([place])   3. Essential hypertension   4. Hyperlipidemia, unspecified hyperlipidemia type   5. Stage 3 chronic kidney disease, unspecified whether stage 3a or 3b CKD   6. Pre-procedure lab exam   7. Medication management     Plan:    Persistent Atrial Fibrillation with RVR - Failed DCCV in 01/2020 and was started on Amiodarone with plans for repeat DCCV after adequate load.  - Still in atrial fibrillation today. Rates controlled. - LVEF of 30-35%  on Echo in 01/2020.  - Continue Coreg 25mg  twice daily.  - Continue Amiodarone 200mg  daily. - Will stop Digoxin per last Cardiology note in the hospital given rates are controlled. - Continue Eliquis 5mg  twice daily. She has not missed any doses since discharge. - Will arrange DCCV. Scheduled for 03/09/2020 with Dr. Audie Box. Will check pre-procedural labs (CBC and BMET) in 1 week. Discussed procedure with her and she agrees to proceed.  Chronic Systolic CHF - BNP elevated in the 600's.  - TEE in 01/2020 showed LVEF of 30-35% with global hypokinesis. RV also moderately enlarged with moderated reduced systolic function. Also noted to have moderate to severe MR and moderate TR. - Patient has some lower extremity edema on exam but otherwise appears euvolemic.  - Will increase Lasix to 40mg  daily. - Continue Entresto 24-26mg  twice daily. Will continue current dose given BP has been soft at times.  - Continue Coreg 25mg  twice daily. 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 Patient will need repeat Echo after restoration of sinus rhythm. If EF has not improved, will need ischemic work-up. She denies any chest pain. - Discussed importance of daily weights and sodium/fluid restrictions.  - Will recheck BMET in 1 week.  Hypertension - BP well controlled. - Continue Entresto and Coreg as above.  Hyperlipidemia - Continue Lipitor 40mg  daily.  CKD Stage III - Creatinine 0.93 on 02/23/2020.  - Will check in 1 week after increasing Lasix.  Pre-Op - Patient had positive Cologuard and has been referred to GI for colonoscopy.  - Discussed that patient will need to wait at least 4 weeks after cardioversion before being able to come off Eliquis for colonoscopy. She understand and agrees with proceeding with cardioversion. - Patient able to complete 4.0 METS without any anginal symptoms. Will follow-up with her after DCCV. If still stable from a cardiac standpoint at that time, would be OK to proceed with  colonoscopy.   Disposition: Follow up 2 weeks after DCCV.    Medication  Adjustments/Labs and Tests Ordered: Current medicines are reviewed at length with the patient today.  Concerns regarding medicines are outlined above.  Orders Placed This Encounter  Procedures  . ELECTRICAL CARDIOVERSION  . CBC  . Basic metabolic panel  . EKG 12-Lead   Meds ordered this encounter  Medications  . furosemide (LASIX) 40 MG tablet    Sig: Take 1 tablet (40 mg total) by mouth daily.    Dispense:  90 tablet    Refill:  3    Patient Instructions  Medication Instructions:  INCREASE lasix to 40mg  daily STOP digoxin  *If you need a refill on your cardiac medications before your next appointment, please call your pharmacy*   Lab Work: CBC & BMET in 1 week - Friday July 2 @ any [facility] convenient for you  If you have labs (blood work) drawn today and your tests are completely normal, you will receive your results only by: Marland Kitchen MyChart Message (if you have MyChart) OR . A paper copy in the mail If you have any lab test that is abnormal or we need to change your treatment, we will call you to review the results.   Testing/Procedures: Cardioversion @ [facility] Thursday July 8 with Dr. Eleonore Chiquito  You will need to have the coronavirus test completed prior to your procedure. An appointment has been made at 2:45pm on Tuesday July 6. This is a Drive Up Visit at the [facility] [address]. Please tell them that you are there for pre-procedure testing. Someone will direct you to the appropriate testing line. Stay in your car and someone will be with you shortly. Please make sure to have all other labs completed before this test because you will need to stay quarantined until your procedure. Please take your insurance card to this test.     Follow-Up: At [facility], you and your health needs are our priority.  As part of our continuing mission to provide you with exceptional  heart care, we have created designated Provider Care Teams.  These Care Teams include your primary Cardiologist (physician) and Advanced Practice Providers (APPs -  Physician Assistants and Nurse Practitioners) who all work together to provide you with the care you need, when you need it.  We recommend signing up for the patient portal called "MyChart".  Sign up information is provided on this After Visit Summary.  MyChart is used to connect with patients for Virtual Visits (Telemedicine).  Patients are able to view lab/test results, encounter notes, upcoming appointments, etc.  Non-urgent messages can be sent to your provider as well.   To learn more about what you can do with MyChart, go to [email or web address].    Your next appointment:   Approximately week of July 26 (about 3 weeks post-cardioversion)  The format for your next appointment:   In Person  Provider:   You may see Jenkins Rouge, MD or one of the following Advanced Practice Providers on your designated Care Team:    Truitt Merle, NP  Cecilie Kicks, NP  Kathyrn Drown, NP    Other Instructions   You are scheduled for a Cardioversion on Thursday July 8 with Dr. Eleonore Chiquito.  Please arrive at the [facility] (Main Entrance A) at [facility]: [place], [place] [id number] at [place].   DIET: Nothing to eat or drink after midnight except a sip of water with medications (see medication instructions below)  Medication Instructions: Hold N/A  Continue your  anticoagulant: Eliquis You will need to continue your anticoagulant after your procedure until you are told by your provider that it is safe to stop   Labs: Friday July 2 @ any [facility] convenient for you   You must have a responsible person to drive you home and stay in the waiting area during your procedure. Failure to do so could result in cancellation.  Bring your insurance cards.  *Special Note: Every effort is made to have your procedure  done on time. Occasionally there are emergencies that occur at the hospital that may cause delays. Please be patient if a delay does occur.       Signed, Darreld Mclean, PA-C  02/25/2020 3:09 PM    [facility]

## 2020-02-22 ENCOUNTER — Other Ambulatory Visit: Payer: Self-pay

## 2020-02-22 ENCOUNTER — Ambulatory Visit (INDEPENDENT_AMBULATORY_CARE_PROVIDER_SITE_OTHER): Payer: Medicare Other | Admitting: Family Medicine

## 2020-02-22 ENCOUNTER — Encounter: Payer: Self-pay | Admitting: Family Medicine

## 2020-02-22 VITALS — BP 120/68 | HR 61 | Temp 97.9°F | Ht 60.0 in | Wt 143.0 lb

## 2020-02-22 DIAGNOSIS — I4891 Unspecified atrial fibrillation: Secondary | ICD-10-CM | POA: Diagnosis not present

## 2020-02-22 DIAGNOSIS — R195 Other fecal abnormalities: Secondary | ICD-10-CM | POA: Diagnosis not present

## 2020-02-22 DIAGNOSIS — I509 Heart failure, unspecified: Secondary | ICD-10-CM | POA: Insufficient documentation

## 2020-02-22 DIAGNOSIS — I5021 Acute systolic (congestive) heart failure: Secondary | ICD-10-CM | POA: Diagnosis not present

## 2020-02-22 DIAGNOSIS — J189 Pneumonia, unspecified organism: Secondary | ICD-10-CM | POA: Diagnosis not present

## 2020-02-22 NOTE — Progress Notes (Signed)
Subjective:    Patient ID: Erin Wong, female    DOB: Jan 05, 1951, 69 y.o.   MRN: [id number]  Was recently admitted to hospital:  Admit date: 02/11/2020 Discharge date: 02/16/2020  Admitted From: Home.  Disposition: Home.   Recommendations for Outpatient Follow-up:  1. Follow up with PCP in 1-2 weeks 2. Please obtain BMP/CBC in one week 3. Please follow up with cardiology as scheduled. 4. Please follow up with a CXR in 2 to 4 weeks   Home Health:none.   Discharge Condition: STABLE.  CODE STATUS: FULL CODE.  Diet recommendation: Heart Healthy  Brief/Interim Summary: Erin Wong a 69 y.o.femalewithhistory of A. fib recently diagnosed cardiomyopathy admitted last month for A. fib with RVR with CHF and was placed on amiodarone and Eliquis at that time. Pt has been having productive cough of yellowish sputum and shortness of breath for the last 1 week had gone to her PCP about 2 days ago and was advised symptomatic treatment. Patient states that over the last 2 days patient has become more acutely SOB, with some lower extremity edema. In the ED, patient was noted to be in A. fib with RVR was started on Cardizem drip. CT angiogram of the chest done shows features concerning for bronchiolitis viral versus bacterial with some consolidation in the right upper and lower lobes. Covid test is negative. Labs show sodium 126 LFTs unremarkable WBC count is elevated at 17.2. Pt admitted for further management. She was started on IV antibiotics and cardiology consulted.  She was transitioned to oral antibiotics, weaned off oxygen and transitioned to oral digoxin on discharge.   Discharge Diagnoses:  Principal Problem:   Acute respiratory failure with hypoxia (HCC) Active Problems:   Asthma   CKD (chronic kidney disease) stage 3, GFR 30-59 ml/min   Benign essential HTN   Atrial fibrillation with rapid ventricular response (HCC)   Acute systolic heart failure (HCC)   Acute  bronchitis  Acute hypoxic respiratory failure likely 2/2 pneumonia with underlying asthma and CHF Much improved, weaned off oxygen. With oxygen sats between 92 to 95% on RA.  Currently afebrile with leukocytosis (on steroids) CTA chest concerning for possible viral bronchiolitis versus bacterial with some consolidation in the right upper and lower lobes  she was started on azithromycin, ceftriaxone, completed 5 days of IV antibiotics, plan for another 2 days of Augmentin on discharge.  Continue supplemental oxygen, duonebs, inhalers, and taper steroids on discharge.    Paroxysmal A. fib with RVR Better controlled today on digoxin.  Failed DCCV during last admission, was started on amiodarone Status post diltiazem drip, discontinued due to low EF Cardiology on board, continue amiodarone, increase Coreg to 25 twice daily, continue Eliquis Plan for a repeat DCCV as an outpt Plan for repeat echo as an outpatient, if EF still reduced, plan for ischemic work-up as per cardiology   Acute on chronic systolic HF/cardiomyopathy possibly tachy-mediated BNP elevated TEE done 01/2020 showed EF of 30 to 35% with global hypokinesis, moderate to severe MR and TR Continue Lasix, Coreg, Entresto Strict I's and O's, daily weights  Hyponatremia/hypomagnesemia Replaced.   Asthma Management as above  HLD Continue statins  02/22/20 Patient states that her cough has improved.  Her oxygen saturation has also improved.  She still has a cough and she is bringing up mucus however she denies any purulent mucus.  Today on respiratory exam, she has no crackles that are appreciated.  The location of her pneumonia was in the right  upper lobe based on her chest x-ray findings.  Today on exam I do not appreciate any abnormal lung sounds in that area and she clinically appears to be improving.  She has 1 additional day of steroids.  She is not wheezing on today's exam at all.  She does have +1 edema in her feet  up to her ankle particular on the right side and trace edema on the left side.  She is taking Lasix.  She denies any chest pain.  She denies any orthopnea.  She does have some mild dyspnea with activity.  She is taking Entresto 24/26 twice daily as well as Lasix.  She is questioning if she could increase the Lasix to help with the swelling.  She has an appointment to see her cardiologist on Friday.  She is currently on a combination of amiodarone, carvedilol, and digoxin for rate control.  Today her heart rate is between 60 and 70 bpm on exam although she is clearly in atrial fibrillation.  She denies any syncope or near syncope or lightheadedness.  She is appropriately anticoagulated on Eliquis. Past Medical History:  Diagnosis Date  . Allergy   . Arthritis   . Asthma   . Benign essential HTN   . CKD (chronic kidney disease) stage 3, GFR 30-59 ml/min   . HLD (hyperlipidemia)    Past Surgical History:  Procedure Laterality Date  . ABDOMINAL HYSTERECTOMY  10/04/1995   Hysterectomy and Bilateral oophorectomy  . CARDIOVERSION N/A 01/17/2020   Procedure: CARDIOVERSION;  Surgeon: [name], MD;  Location: [facility] ENDOSCOPY;  Service: Cardiovascular;  Laterality: N/A;  . COLON SURGERY  09/2009   hole in colon  . FRACTURE SURGERY Right 08/02/2011   Right Leg--Plates & Screws  . 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 TEE WITHOUT CARDIOVERSION N/A 01/17/2020   Procedure: TRANSESOPHAGEAL ECHOCARDIOGRAM (TEE);  Surgeon: [name], MD;  Location: [facility] ENDOSCOPY;  Service: Cardiovascular;  Laterality: N/A;   Current Outpatient Medications on File Prior to Visit  Medication Sig Dispense Refill  . acetaminophen (TYLENOL) 650 MG CR tablet Take 2 tablets (1,300 mg total) by mouth 2 (two) times daily as needed for pain. 120 tablet 3  . albuterol (PROVENTIL HFA;VENTOLIN HFA) 108 (90 Base) MCG/ACT inhaler Inhale  1 puff into the lungs every 6 (six) hours as needed for wheezing or shortness of breath. 18 g 3  . amiodarone (PACERONE) 200 MG tablet Take 1 tablet (200mg ) twice daily (Patient taking differently: Take 200 mg by mouth 2 (two) times daily. ) 60 tablet 3  . apixaban (ELIQUIS) 5 MG TABS tablet Take 1 tablet (5 mg total) by mouth 2 (two) times daily. 60 tablet 2  . atorvastatin (LIPITOR) 40 MG tablet TAKE 1 TABLET BY MOUTH EVERY DAY (Patient taking differently: Take 40 mg by mouth daily. ) 90 tablet 1  . benzonatate (TESSALON) 100 MG capsule Take 1 capsule (100 mg total) by mouth 3 (three) times daily as needed for cough. 20 capsule 0  . calcium gluconate 500 MG tablet Take 1 tablet by mouth daily.    . carvedilol (COREG) 25 MG tablet Take 1 tablet (25 mg total) by mouth 2 (two) times daily with a meal. 60 tablet 1  . cholecalciferol (VITAMIN D) 1000 units tablet Take 1 tablet (1,000 Units total) by mouth daily. 90 tablet 1  . dextromethorphan-guaiFENesin (MUCINEX DM) 30-600 MG  12hr tablet Take 1 tablet by mouth 2 (two) times daily. 30 tablet 0  . digoxin (LANOXIN) 0.125 MG tablet Take 1 tablet (0.125 mg total) by mouth daily. 30 tablet 0  . fluticasone (FLONASE) 50 MCG/ACT nasal spray Place 2 sprays into both nostrils daily. 16 g 6  . Fluticasone-Salmeterol (ADVAIR DISKUS) 100-50 MCG/DOSE AEPB INHALE 1 PUFF INTO THE LUNGS TWICE A DAY (Patient taking differently: Inhale 1 puff into the lungs in the morning and at bedtime. ) 60 each 5  . furosemide (LASIX) 20 MG tablet Take 1 tablet (20 mg total) by mouth daily. 30 tablet 3  . Investigational - Study Medication Study - DAPA TIMI 68 - dapagliflozin (FARXIGA) 10 mg or placebo tablet (PI-Dalton McLean): 1 tablet, Oral, Daily, First dose (after last modification) on Tue 02/15/20 at 1100 1 each 0  . omeprazole (PRILOSEC) 20 MG capsule TAKE 1 CAPSULE BY MOUTH EVERY DAY (Patient taking differently: Take 20 mg by mouth daily. ) 90 capsule 3  . polyethylene glycol  (MIRALAX / GLYCOLAX) 17 g packet Take 17 g by mouth daily.    . predniSONE (DELTASONE) 20 MG tablet Prednisone 40 mg daily for 3 days followed by  Prednisone 20 mg daily for 3 days. 9 tablet 0  . sacubitril-valsartan (ENTRESTO) 24-26 MG Take 1 tablet by mouth 2 (two) times daily. 60 tablet 1  . traMADol (ULTRAM) 50 MG tablet TAKE 2 TABLETS BY MOUTH 3 TIMES A DAY AS NEEDED FOR PAIN (Patient taking differently: Take 100 mg by mouth 3 (three) times daily as needed for moderate pain. ) 180 tablet 2   No current facility-administered medications on file prior to visit.   No Known Allergies Social History   Socioeconomic History  . Marital status: Married    Spouse name: Not on file  . Number of children: Not on file  . Years of education: Not on file  . Highest education level: Not on file  Occupational History  . Not on file  Tobacco Use  . Smoking status: Former Smoker    Quit date: 01/10/1991    Years since quitting: 29.1  . Smokeless tobacco: Never Used  Vaping Use  . Vaping Use: Never used  Substance and Sexual Activity  . Alcohol use: No  . Drug use: No  . Sexual activity: Never  Other Topics Concern  . Not on file  Social History Narrative  . Not on file   Social Determinants of Health   Financial Resource Strain:   . Difficulty of Paying Living Expenses:   Food Insecurity:   . Worried About Charity fundraiser in the Last Year:   . Arboriculturist in the Last Year:   Transportation Needs:   . Film/video editor (Medical):   Marland Kitchen Lack of Transportation (Non-Medical):   Physical Activity:   . Days of Exercise per Week:   . Minutes of Exercise per Session:   Stress:   . Feeling of Stress :   Social Connections:   . Frequency of Communication with Friends and Family:   . Frequency of Social Gatherings with Friends and Family:   . Attends Religious Services:   . Active Member of Clubs or Organizations:   . Attends Archivist Meetings:   Marland Kitchen Marital  Status:   Intimate Partner Violence:   . Fear of Current or Ex-Partner:   . Emotionally Abused:   Marland Kitchen Physically Abused:   . Sexually Abused:  Review of Systems  All other systems reviewed and are negative.      Objective:   Physical Exam Vitals reviewed.  Constitutional:      Appearance: She is well-developed.  Cardiovascular:     Rate and Rhythm: Normal rate. Rhythm irregular.     Heart sounds: Normal heart sounds. No murmur heard.  No friction rub. No gallop.   Pulmonary:     Effort: Pulmonary effort is normal. No respiratory distress.     Breath sounds: Normal breath sounds. No wheezing or rales.  Abdominal:     General: Bowel sounds are normal. There is no distension.     Palpations: Abdomen is soft. There is no mass.     Tenderness: There is no abdominal tenderness. There is no guarding or rebound.  Musculoskeletal:     Right knee: Swelling present. Decreased range of motion. Tenderness present over the medial joint line and lateral joint line.     Right lower leg: Edema present.     Left lower leg: Edema present.           Assessment & Plan:  Community acquired pneumonia of right upper lobe of lung - Plan: DG Chest 2 View  Acute systolic congestive heart failure ([place]) - Plan: CBC with Differential/Platelet, COMPLETE METABOLIC PANEL WITH GFR  Positive colorectal cancer screening using Cologuard test  Atrial fibrillation with rapid ventricular response ([place])  Patient is in atrial fibrillation today however she is rate controlled on digoxin, amiodarone, and carvedilol.  She is appropriately anticoagulated on Eliquis.  I will check a CBC to monitor for any evidence of anemia.  She does not appear fluid overloaded on exam.  We discussed increasing her Lasix versus increasing her Entresto.  I believe the patient would likely benefit with an increased dose of Entresto to 49/51 twice daily rather than increasing the Lasix due to the long-term mortality benefit and  its diuretic property.  She has an appointment to see her cardiologist on Friday and I have recommended that she discuss this with them at that time and I will defer to their expert judgment.  She did have a positive Cologuard test.  This certainly warrants a colonoscopy.  However I believe she needs to be stabilized from a cardiovascular standpoint first.  I will consult GI so that this can be scheduled however we will need clearance from cardiology first.  Regarding her pneumonia, clinically she appears to have cleared it.  I will order a chest x-ray to ensure radiographic clearing

## 2020-02-23 ENCOUNTER — Encounter: Payer: Medicare Other | Admitting: *Deleted

## 2020-02-23 VITALS — BP 114/62 | HR 85 | Ht 60.0 in | Wt 147.7 lb

## 2020-02-23 DIAGNOSIS — Z006 Encounter for examination for normal comparison and control in clinical research program: Secondary | ICD-10-CM

## 2020-02-23 LAB — CBC WITH DIFFERENTIAL/PLATELET
Absolute Monocytes: 1309 cells/uL — ABNORMAL HIGH (ref 200–950)
Basophils Absolute: 17 cells/uL (ref 0–200)
Basophils Relative: 0.1 %
Eosinophils Absolute: 17 cells/uL (ref 15–500)
Eosinophils Relative: 0.1 %
HCT: 38.4 % (ref 35.0–45.0)
Hemoglobin: 13 g/dL (ref 11.7–15.5)
Lymphs Abs: 2125 cells/uL (ref 850–3900)
MCH: 32.2 pg (ref 27.0–33.0)
MCHC: 33.9 g/dL (ref 32.0–36.0)
MCV: 95 fL (ref 80.0–100.0)
MPV: 9.6 fL (ref 7.5–12.5)
Monocytes Relative: 7.7 %
Neutro Abs: 13532 cells/uL — ABNORMAL HIGH (ref 1500–7800)
Neutrophils Relative %: 79.6 %
Platelets: 475 10*3/uL — ABNORMAL HIGH (ref 140–400)
RBC: 4.04 10*6/uL (ref 3.80–5.10)
RDW: 13.1 % (ref 11.0–15.0)
Total Lymphocyte: 12.5 %
WBC: 17 10*3/uL — ABNORMAL HIGH (ref 3.8–10.8)

## 2020-02-23 LAB — COMPLETE METABOLIC PANEL WITH GFR
AG Ratio: 1.8 (calc) (ref 1.0–2.5)
ALT: 32 U/L — ABNORMAL HIGH (ref 6–29)
AST: 21 U/L (ref 10–35)
Albumin: 3.5 g/dL — ABNORMAL LOW (ref 3.6–5.1)
Alkaline phosphatase (APISO): 51 U/L (ref 37–153)
BUN/Creatinine Ratio: 19 (calc) (ref 6–22)
BUN: 21 mg/dL (ref 7–25)
CO2: 26 mmol/L (ref 20–32)
Calcium: 8.6 mg/dL (ref 8.6–10.4)
Chloride: 101 mmol/L (ref 98–110)
Creat: 1.09 mg/dL — ABNORMAL HIGH (ref 0.50–0.99)
GFR, Est African American: 60 mL/min/{1.73_m2} (ref >=60)
GFR, Est Non African American: 52 mL/min/{1.73_m2} — ABNORMAL LOW (ref >=60)
Globulin: 2 g/dL (calc) (ref 1.9–3.7)
Glucose, Bld: 85 mg/dL (ref 65–99)
Potassium: 4.5 mmol/L (ref 3.5–5.3)
Sodium: 135 mmol/L (ref 135–146)
Total Bilirubin: 0.7 mg/dL (ref 0.2–1.2)
Total Protein: 5.5 g/dL — ABNORMAL LOW (ref 6.1–8.1)

## 2020-02-23 NOTE — Research (Signed)
   Week 1 Visit Worksheet  PATIENT ID: [id number]   DATE OF CONTACT: 02/23/2020  Type of Visit*:   *Visit should be done in person whenever possible  [x]  Clinic Visit                         []  Telephone Contact with Patient     []  Family Member or Caregiver  []  Primary Care Physician   []  Medical Record Review                    []  Other (specify)____________  1. Did patient experience any AEs leading to study drug discontinuation, SAEs related to study drug, protocol-defined unexpected/unanticipated events, clinical endpoints, or adverse events of special interest since the last visit?  []     Yes*  [x]     No  *If yes, please record on Event Log  2. Were there any changes to patient's medications since the last visit?   []     Yes*  [x]      No  *If yes, please record on ConMeds Log  3. Vital Signs  Pulse 85    Height 60 []  cm  [x]   in (xxx.xx)  B/P 114/62   Body Weight 147.7   []  kg   [x]   lbs  (xxx.xx)  4. Local Labs Performed  Creatinine  [x]  Yes  []  No Potassium  [x]   Yes []  No  5. Is Patient currently taking study drug?  [x]  Yes    or   []  No*     *If no, describe reason:     6. Study Drug Compliance  Date last tablet taken: 02/23/2020 Date Returned:02/23/2020   # Tablets Returned: 8  How many days did patient NOT take the study drug since last visit?  __0__Days  Investigator judgment of Compliance: [x]  Reasonable (? 20% deviation from expected)        []  Questionable (>20% deviation from expected)   7. Other Items  [x]  Schedule Month 1 Visit if not previously scheduled    []  Schedule Month 2 Visit   [x]  Remind patient of importance of complying with study medication  [x]  Remind patient to bring all study medication and empty containers to next visit

## 2020-02-24 ENCOUNTER — Ambulatory Visit
Admission: RE | Admit: 2020-02-24 | Discharge: 2020-02-24 | Disposition: A | Discharge status: Home / Self Care | Payer: Medicare Other | Source: Ambulatory Visit | Attending: Family Medicine | Admitting: Family Medicine

## 2020-02-24 ENCOUNTER — Other Ambulatory Visit: Payer: Self-pay

## 2020-02-24 DIAGNOSIS — J189 Pneumonia, unspecified organism: Secondary | ICD-10-CM

## 2020-02-24 LAB — BASIC METABOLIC PANEL
BUN/Creatinine Ratio: 17 (ref 12–28)
BUN: 16 mg/dL (ref 8–27)
CO2: 18 mmol/L — ABNORMAL LOW (ref 20–29)
Calcium: 8.5 mg/dL — ABNORMAL LOW (ref 8.7–10.3)
Chloride: 102 mmol/L (ref 96–106)
Creatinine, Ser: 0.93 mg/dL (ref 0.57–1.00)
GFR calc Af Amer: 73 mL/min/{1.73_m2} (ref >59)
GFR calc non Af Amer: 63 mL/min/{1.73_m2} (ref >59)
Glucose: 102 mg/dL — ABNORMAL HIGH (ref 65–99)
Potassium: 4 mmol/L (ref 3.5–5.2)
Sodium: 135 mmol/L (ref 134–144)

## 2020-02-25 ENCOUNTER — Encounter: Payer: Self-pay | Admitting: Student

## 2020-02-25 ENCOUNTER — Other Ambulatory Visit: Payer: Self-pay

## 2020-02-25 ENCOUNTER — Ambulatory Visit (INDEPENDENT_AMBULATORY_CARE_PROVIDER_SITE_OTHER): Payer: Medicare Other | Admitting: Student

## 2020-02-25 VITALS — BP 122/66 | HR 85 | Ht 64.0 in | Wt 146.6 lb

## 2020-02-25 DIAGNOSIS — Z79899 Other long term (current) drug therapy: Secondary | ICD-10-CM

## 2020-02-25 DIAGNOSIS — I4819 Other persistent atrial fibrillation: Secondary | ICD-10-CM

## 2020-02-25 DIAGNOSIS — E785 Hyperlipidemia, unspecified: Secondary | ICD-10-CM

## 2020-02-25 DIAGNOSIS — Z01812 Encounter for preprocedural laboratory examination: Secondary | ICD-10-CM

## 2020-02-25 DIAGNOSIS — I5022 Chronic systolic (congestive) heart failure: Secondary | ICD-10-CM | POA: Diagnosis not present

## 2020-02-25 DIAGNOSIS — N183 Chronic kidney disease, stage 3 unspecified: Secondary | ICD-10-CM

## 2020-02-25 DIAGNOSIS — I10 Essential (primary) hypertension: Secondary | ICD-10-CM

## 2020-02-25 MED ORDER — FUROSEMIDE 40 MG PO TABS: 40.0000 mg | ORAL_TABLET | Freq: Every day | ORAL | 3 refills | Status: DC

## 2020-02-25 NOTE — Patient Instructions (Signed)
Medication Instructions:  INCREASE lasix to 40mg  daily STOP digoxin  *If you need a refill on your cardiac medications before your next appointment, please call your pharmacy*   Lab Work: [place] in 1 week - Friday July 2 @ any [facility] convenient for you  If you have labs (blood work) drawn today and your tests are completely normal, you will receive your results only by: Marland Kitchen MyChart Message (if you have MyChart) OR . A paper copy in the mail If you have any lab test that is abnormal or we need to change your treatment, we will call you to review the results.   Testing/Procedures: Cardioversion @ [facility] Thursday July 8 with Dr. Eleonore Chiquito  You will need to have the coronavirus test completed prior to your procedure. An appointment has been made at 2:45pm on Tuesday July 6. This is a Drive Up Visit at the [facility] [address]. Please tell them that you are there for pre-procedure testing. Someone will direct you to the appropriate testing line. Stay in your car and someone will be with you shortly. Please make sure to have all other labs completed before this test because you will need to stay quarantined until your procedure. Please take your insurance card to this test.     Follow-Up: At [facility] you and your health needs are our priority.  As part of our continuing mission to provide you with exceptional heart care, we have created designated Provider Care Teams.  These Care Teams include your primary Cardiologist (physician) and Advanced Practice Providers (APPs -  Physician Assistants and Nurse Practitioners) who all work together to provide you with the care you need, when you need it.  We recommend signing up for the patient portal called "MyChart".  Sign up information is provided on this After Visit Summary.  MyChart is used to connect with patients for Virtual Visits (Telemedicine).  Patients are able to view lab/test results, encounter notes,  upcoming appointments, etc.  Non-urgent messages can be sent to your provider as well.   To learn more about what you can do with MyChart, go to [email or web address].    Your next appointment:   Approximately week of July 26 (about 3 weeks post-cardioversion)  The format for your next appointment:   In Person  Provider:   You may see Jenkins Rouge, MD or one of the following Advanced Practice Providers on your designated Care Team:    Truitt Merle, NP  Cecilie Kicks, NP  Kathyrn Drown, NP    Other Instructions   You are scheduled for a Cardioversion on Thursday July 8 with Dr. Eleonore Chiquito.  Please arrive at the [facility] (Main Entrance A) at [facility]: [place], [place] [address] at [place].   DIET: Nothing to eat or drink after midnight except a sip of water with medications (see medication instructions below)  Medication Instructions: Hold N/A  Continue your anticoagulant: Eliquis You will need to continue your anticoagulant after your procedure until you are told by your provider that it is safe to stop   Labs: Friday July 2 @ any [facility] convenient for you   You must have a responsible person to drive you home and stay in the waiting area during your procedure. Failure to do so could result in cancellation.  Bring your insurance cards.  *Special Note: Every effort is made to have your procedure done on time. Occasionally there are emergencies that occur  at the hospital that may cause delays. Please be patient if a delay does occur.

## 2020-03-04 LAB — BASIC METABOLIC PANEL
BUN/Creatinine Ratio: 12 (ref 12–28)
BUN: 13 mg/dL (ref 8–27)
CO2: 25 mmol/L (ref 20–29)
Calcium: 9.3 mg/dL (ref 8.7–10.3)
Chloride: 100 mmol/L (ref 96–106)
Creatinine, Ser: 1.08 mg/dL — ABNORMAL HIGH (ref 0.57–1.00)
GFR calc Af Amer: 61 mL/min/{1.73_m2} (ref >59)
GFR calc non Af Amer: 53 mL/min/{1.73_m2} — ABNORMAL LOW (ref >59)
Glucose: 99 mg/dL (ref 65–99)
Potassium: 4.5 mmol/L (ref 3.5–5.2)
Sodium: 137 mmol/L (ref 134–144)

## 2020-03-04 LAB — CBC
Hematocrit: 37.3 % (ref 34.0–46.6)
Hemoglobin: 12.1 g/dL (ref 11.1–15.9)
MCH: 31.6 pg (ref 26.6–33.0)
MCHC: 32.4 g/dL (ref 31.5–35.7)
MCV: 97 fL (ref 79–97)
Platelets: 353 10*3/uL (ref 150–450)
RBC: 3.83 x10E6/uL (ref 3.77–5.28)
RDW: 14.1 % (ref 11.7–15.4)
WBC: 7.6 10*3/uL (ref 3.4–10.8)

## 2020-03-07 ENCOUNTER — Other Ambulatory Visit (HOSPITAL_COMMUNITY)
Admission: RE | Admit: 2020-03-07 | Discharge: 2020-03-07 | Disposition: A | Discharge status: Home / Self Care | Payer: Medicare Other | Source: Ambulatory Visit | Attending: Cardiovascular Disease | Admitting: Cardiovascular Disease

## 2020-03-07 DIAGNOSIS — Z01812 Encounter for preprocedural laboratory examination: Secondary | ICD-10-CM | POA: Insufficient documentation

## 2020-03-07 DIAGNOSIS — Z20822 Contact with and (suspected) exposure to covid-19: Secondary | ICD-10-CM | POA: Diagnosis not present

## 2020-03-07 LAB — SARS CORONAVIRUS 2 (TAT 6-24 HRS): SARS Coronavirus 2: NEGATIVE

## 2020-03-08 ENCOUNTER — Telehealth: Payer: Self-pay | Admitting: Cardiovascular Disease

## 2020-03-08 NOTE — Telephone Encounter (Signed)
Pt c/o swelling: STAT is pt has developed SOB within 24 hours  1) How much weight have you gained and in what time span? Fluctuating, at most 3lbs in a day.     2) If swelling, where is the swelling located?  On right foot, foot and ankle. On left foot, just foot.   3) Are you currently taking a fluid pill? furosemide (LASIX) 40 MG tablet  4) Are you currently SOB? No    5) Do you have a log of your daily weights (if so, list)?        Fri 145lbs             Sat -----             Nancy Fetter -----             Mon 147.7lbs              Tues 144.4lbs             Wed 147.4lbs  6) Have you gained 3 pounds in a day or 5 pounds in a week? Yes  7) Have you traveled recently? No  Been putting feet in cold water. Patient states the swelling is better at night and worse in the day.

## 2020-03-08 NOTE — Telephone Encounter (Signed)
I would continue with current dose of Lasix. Agree with elevating feet and reducing salt intake.  She should limit her sodium intake to <2g per day and limit fluid intake to 64 ounces per day. She can also try compression stockings to help with her lower extremity edema.  She should continue to weigh herself daily. It is reassuring that her weight returned to baseline the next day following the 3lb weight gain, but she should let us know if weight does not return to baseline (sounds like her dry weight is 144-147 lbs). I would proceed with cardioversion tomorrow as being in atrial fibrillation can exacerbate heart failure symptoms and so it may help to get her back in normal sinus rhythm. I reviewed her labs and they look good. Kidney function is stable and her WBC is back to normal.   Thank you!

## 2020-03-08 NOTE — Telephone Encounter (Signed)
Called patient about her message. Patient stated her feet swell during the day. Patient's weight is 147 lbs today. Patient was 146 lbs at last office visit 2 weeks ago. Patient weight has been fluctuating between 144 to 147 lbs, but has not had a 5 lb weight gain in one week. Patient did have a 3 lb weigh gain, but went back to the weight she was the day before. Patient denies any SOB. Patient stated her foot swelling goes down at night. Encouraged patient to keep her feet elevated and reduce salt in her diet. Patient stated she continues to take Lasix 40 mg daily. Patient is also concerned since she has a cardioversion tomorrow. Will forward to [facility] PA for further advisement.

## 2020-03-08 NOTE — Telephone Encounter (Signed)
Patient aware of recommendations form Sande Rives PA.

## 2020-03-09 ENCOUNTER — Ambulatory Visit (HOSPITAL_COMMUNITY): Admission: RE | Admit: 2020-03-09 | Payer: Medicare Other | Source: Home / Self Care | Admitting: Cardiovascular Disease

## 2020-03-09 ENCOUNTER — Encounter (HOSPITAL_COMMUNITY): Admission: RE | Payer: Self-pay | Source: Home / Self Care

## 2020-03-09 ENCOUNTER — Telehealth: Payer: Self-pay | Admitting: Cardiovascular Disease

## 2020-03-09 DIAGNOSIS — Z01812 Encounter for preprocedural laboratory examination: Secondary | ICD-10-CM

## 2020-03-09 DIAGNOSIS — I4819 Other persistent atrial fibrillation: Secondary | ICD-10-CM

## 2020-03-09 SURGERY — CARDIOVERSION
Anesthesia: General

## 2020-03-09 NOTE — Telephone Encounter (Signed)
New message   Patient states that she will not be able to come to procedure that Dr. Marisue Ivan was going to perform due to her husband being sick and the patient does not have any transportation. Please advise.

## 2020-03-09 NOTE — Telephone Encounter (Signed)
I will route call to both primary card nurse for Dr. Johnsie Cancel as well as to Sande Rives, [place] who recent had seen the pt and ordered the DCCV.

## 2020-03-09 NOTE — Telephone Encounter (Signed)
Called patient to discuss canceling cardioversion. Patient stated she wanted to cancel and reschedule at a later time due to her husband being sick today. Patient did not want to reschedule at this time. Encouraged patient to call soon to reschedule and to continue her eliquis 5 mg BID.

## 2020-03-10 NOTE — Telephone Encounter (Signed)
Placed orders for DCCV.

## 2020-03-10 NOTE — Telephone Encounter (Signed)
Ok, thank you for the update.

## 2020-03-10 NOTE — Telephone Encounter (Signed)
Patient will have cardioversion on 03/15/20 with Dr. Sallyanne Kuster. Patient will get lab work and [place] screening done on 03/13/20. Will send to [facility] PA to complete orders.

## 2020-03-10 NOTE — Telephone Encounter (Signed)
Pt called back today and wanted to re-schedule her Cardioversion. Please call to reschedule

## 2020-03-10 NOTE — Progress Notes (Signed)
Patient will have cardioversion on 03/15/20 with Dr. Sallyanne Kuster. Patient will get lab work and [place] screening done on 03/13/20.

## 2020-03-13 ENCOUNTER — Other Ambulatory Visit: Payer: Medicare Other | Admitting: *Deleted

## 2020-03-13 ENCOUNTER — Other Ambulatory Visit (HOSPITAL_COMMUNITY)
Admission: RE | Admit: 2020-03-13 | Discharge: 2020-03-13 | Disposition: A | Discharge status: Home / Self Care | Payer: Medicare Other | Source: Ambulatory Visit | Attending: Cardiovascular Disease | Admitting: Cardiovascular Disease

## 2020-03-13 ENCOUNTER — Other Ambulatory Visit: Payer: Self-pay

## 2020-03-13 DIAGNOSIS — Z01812 Encounter for preprocedural laboratory examination: Secondary | ICD-10-CM | POA: Insufficient documentation

## 2020-03-13 DIAGNOSIS — Z20822 Contact with and (suspected) exposure to covid-19: Secondary | ICD-10-CM | POA: Diagnosis not present

## 2020-03-13 DIAGNOSIS — I4819 Other persistent atrial fibrillation: Secondary | ICD-10-CM

## 2020-03-13 LAB — CBC WITH DIFFERENTIAL/PLATELET
Basophils Absolute: 0.1 10*3/uL (ref 0.0–0.2)
Basos: 1 %
EOS (ABSOLUTE): 0.1 10*3/uL (ref 0.0–0.4)
Eos: 1 %
Hematocrit: 34.9 % (ref 34.0–46.6)
Hemoglobin: 11.6 g/dL (ref 11.1–15.9)
Lymphocytes Absolute: 2.6 10*3/uL (ref 0.7–3.1)
Lymphs: 36 %
MCH: 31.6 pg (ref 26.6–33.0)
MCHC: 33.2 g/dL (ref 31.5–35.7)
MCV: 95 fL (ref 79–97)
Monocytes Absolute: 1 10*3/uL — ABNORMAL HIGH (ref 0.1–0.9)
Monocytes: 14 %
Neutrophils Absolute: 3.5 10*3/uL (ref 1.4–7.0)
Neutrophils: 48 %
Platelets: 451 10*3/uL — ABNORMAL HIGH (ref 150–450)
RBC: 3.67 x10E6/uL — ABNORMAL LOW (ref 3.77–5.28)
RDW: 16 % — ABNORMAL HIGH (ref 11.7–15.4)
WBC: 7.2 10*3/uL (ref 3.4–10.8)

## 2020-03-13 LAB — SARS CORONAVIRUS 2 (TAT 6-24 HRS): SARS Coronavirus 2: NEGATIVE

## 2020-03-13 LAB — BASIC METABOLIC PANEL
BUN/Creatinine Ratio: 14 (ref 12–28)
BUN: 13 mg/dL (ref 8–27)
CO2: 27 mmol/L (ref 20–29)
Calcium: 9.2 mg/dL (ref 8.7–10.3)
Chloride: 102 mmol/L (ref 96–106)
Creatinine, Ser: 0.91 mg/dL (ref 0.57–1.00)
GFR calc Af Amer: 74 mL/min/{1.73_m2} (ref >59)
GFR calc non Af Amer: 65 mL/min/{1.73_m2} (ref >59)
Glucose: 100 mg/dL — ABNORMAL HIGH (ref 65–99)
Potassium: 4.2 mmol/L (ref 3.5–5.2)
Sodium: 136 mmol/L (ref 134–144)

## 2020-03-14 ENCOUNTER — Encounter: Payer: Medicare Other | Admitting: *Deleted

## 2020-03-14 DIAGNOSIS — Z006 Encounter for examination for normal comparison and control in clinical research program: Secondary | ICD-10-CM

## 2020-03-14 NOTE — Research (Addendum)
   Month 1 Visit Worksheet  PATIENT ID: ____________[id number]_____________   DATE OF CONTACT:  ____07____/ __13_____/___2021______   Type of Visit*:   *Visit should be done in person whenever possible  [x]  Clinic Visit                         []  Telephone Contact with Patient     []  Family Member or Caregiver  []  Primary Care Physician   []  Medical Record Review                    []  Other (specify)____________  1. Did patient experience any AEs leading to study drug discontinuation, SAEs related to study drug, protocol-defined unexpected/unanticipated events, clinical endpoints, or adverse events of special interest since the last visit?  []     Yes*  [x]     No  *If yes, please record on Event Log  2. Were there any changes to patient's medications since the last visit?   [x]     Yes*  []      No  *If yes, please record on ConMeds Log  3. Vital Signs  Pulse _____65___________   Height _______64_________ []  cm  [x]   in (xxx.xx)  B/P _______95/46_________  Body Weight ___147_______ []  kg   [x]   lbs  (xxx.xx)  4. Local Labs Performed  Creatinine  [x]  Yes  []  No Potassium  [x]   Yes  []  No  5. Is Patient currently taking study drug?  [x]  Yes    or   []  No*     *If no, describe reason:     6. Study Drug Compliance  Date last tablet taken:  ________07/13/2021________  Date Returned: __________07/13/2021__________ # Tablets Returned: _________41________  How many days did patient NOT take the study drug since last visit?  _____0_______Days  Investigator judgment of Compliance: [x]  Reasonable (? 20% deviation from expected)         []  Questionable (>20% deviation from expected)   7. Other Items  []  Schedule Month 1 Visit if not previously scheduled    [x]  Schedule Month 2 Visit   []  Remind patient of importance of complying with study medication  [x]  Remind patient to bring all study medication and empty containers to next visit

## 2020-03-15 ENCOUNTER — Ambulatory Visit (HOSPITAL_COMMUNITY)
Admission: RE | Admit: 2020-03-15 | Discharge: 2020-03-15 | Disposition: A | Discharge status: Home / Self Care | Payer: Medicare Other | Attending: Cardiovascular Disease | Admitting: Cardiovascular Disease

## 2020-03-15 ENCOUNTER — Encounter (HOSPITAL_COMMUNITY): Payer: Self-pay | Admitting: Cardiovascular Disease

## 2020-03-15 ENCOUNTER — Telehealth: Payer: Self-pay | Admitting: Radiology

## 2020-03-15 ENCOUNTER — Telehealth: Payer: Self-pay

## 2020-03-15 ENCOUNTER — Ambulatory Visit (HOSPITAL_COMMUNITY): Payer: Medicare Other | Admitting: Anesthesiology

## 2020-03-15 ENCOUNTER — Encounter (HOSPITAL_COMMUNITY)
Admission: RE | Disposition: A | Discharge status: Home / Self Care | Payer: Self-pay | Source: Home / Self Care | Attending: Cardiovascular Disease

## 2020-03-15 DIAGNOSIS — I4819 Other persistent atrial fibrillation: Secondary | ICD-10-CM | POA: Insufficient documentation

## 2020-03-15 DIAGNOSIS — Z538 Procedure and treatment not carried out for other reasons: Secondary | ICD-10-CM | POA: Insufficient documentation

## 2020-03-15 DIAGNOSIS — I4891 Unspecified atrial fibrillation: Secondary | ICD-10-CM

## 2020-03-15 LAB — BASIC METABOLIC PANEL
BUN/Creatinine Ratio: 11 — ABNORMAL LOW (ref 12–28)
BUN: 11 mg/dL (ref 8–27)
CO2: 24 mmol/L (ref 20–29)
Calcium: 9.1 mg/dL (ref 8.7–10.3)
Chloride: 101 mmol/L (ref 96–106)
Creatinine, Ser: 0.97 mg/dL (ref 0.57–1.00)
GFR calc Af Amer: 69 mL/min/{1.73_m2} (ref >59)
GFR calc non Af Amer: 60 mL/min/{1.73_m2} (ref >59)
Glucose: 115 mg/dL — ABNORMAL HIGH (ref 65–99)
Potassium: 3.9 mmol/L (ref 3.5–5.2)
Sodium: 140 mmol/L (ref 134–144)

## 2020-03-15 SURGERY — CANCELLED PROCEDURE

## 2020-03-15 MED ORDER — SODIUM CHLORIDE 0.9 % IV SOLN: INTRAVENOUS | Status: DC

## 2020-03-15 NOTE — Progress Notes (Signed)
Presenting rhythm was sinus bradycardia. Cardioversion cancelled.

## 2020-03-15 NOTE — Telephone Encounter (Signed)
Follow up ° ° °Patient is returning your call. Please call. ° ° ° °

## 2020-03-15 NOTE — Progress Notes (Signed)
Pt self converted.  MD Croitoru reviewed EKG and confirmed.  Education provided to pt.  Pt verbalized understanding.

## 2020-03-15 NOTE — Telephone Encounter (Signed)
Called patient back to let her know that Dr. Johnsie Cancel has ordered her a monitor to wear. Informed patient she will receive monitor in the mail with instructions. Will forward messages to monitor techs.

## 2020-03-15 NOTE — Telephone Encounter (Signed)
Monitor ordered

## 2020-03-15 NOTE — Telephone Encounter (Signed)
-----   Message from [name], MD sent at 03/15/2020 11:29 AM EDT ----- Thanks, Jeannene Patella can you get her a 14 day monitor  ----- Message ----- From: Sanda Klein, MD Sent: 03/15/2020  11:25 AM EDT To: [name], MD, Darreld Mclean, PA-C  She presented in normal rhythm. Please schedule follow up.

## 2020-03-15 NOTE — Telephone Encounter (Signed)
Enrolled patient for a 14 day Zio monitor to be mailed to patients home.  

## 2020-03-15 NOTE — Telephone Encounter (Signed)
Left message for patient to call back  

## 2020-03-15 NOTE — Anesthesia Preprocedure Evaluation (Signed)
Anesthesia Evaluation  Patient identified by MRN, date of birth, ID band Patient awake    Reviewed: Allergy & Precautions, NPO status , Patient's Chart, lab work & pertinent test results  Airway Mallampati: I  TM Distance: >3 FB Neck ROM: Full    Dental   Pulmonary former smoker,    Pulmonary exam normal        Cardiovascular hypertension, Pt. on medications Normal cardiovascular exam     Neuro/Psych    GI/Hepatic GERD  Medicated and Controlled,  Endo/Other    Renal/GU Renal InsufficiencyRenal disease     Musculoskeletal   Abdominal   Peds  Hematology   Anesthesia Other Findings   Reproductive/Obstetrics                             Anesthesia Physical Anesthesia Plan  ASA: III  Anesthesia Plan: General   Post-op Pain Management:    Induction: Intravenous  PONV Risk Score and Plan: 3 and Treatment may vary due to age or medical condition  Airway Management Planned: Mask  Additional Equipment:   Intra-op Plan:   Post-operative Plan:   Informed Consent: I have reviewed the patients History and Physical, chart, labs and discussed the procedure including the risks, benefits and alternatives for the proposed anesthesia with the patient or authorized representative who has indicated his/her understanding and acceptance.       Plan Discussed with: CRNA and Surgeon  Anesthesia Plan Comments:         Anesthesia Quick Evaluation

## 2020-03-15 NOTE — Interval H&P Note (Signed)
History and Physical Interval Note:  03/15/2020 10:44 AM  Erin Wong  has presented today for surgery, with the diagnosis of AFIB.  The various methods of treatment have been discussed with the patient and family. After consideration of risks, benefits and other options for treatment, the patient has consented to  Procedure(s): CARDIOVERSION (N/A) as a surgical intervention.  The patient's history has been reviewed, patient examined, no change in status, stable for surgery.  I have reviewed the patient's chart and labs.  Questions were answered to the patient's satisfaction.     Louellen Haldeman

## 2020-03-19 ENCOUNTER — Other Ambulatory Visit: Payer: Self-pay | Admitting: Family Medicine

## 2020-03-19 DIAGNOSIS — I10 Essential (primary) hypertension: Secondary | ICD-10-CM

## 2020-03-23 ENCOUNTER — Other Ambulatory Visit: Payer: Self-pay | Admitting: Cardiology

## 2020-03-23 NOTE — Telephone Encounter (Signed)
Eliquis 5mg  refill request received. Patient is 69 years old, weight-66.7kg, Crea-0.97 on 03/14/20, Diagnosis-Afib, and last seen by Elwanda Brooklyn on 02/25/2020. Dose is appropriate based on dosing criteria. Will send in refill to requested pharmacy.

## 2020-03-26 ENCOUNTER — Other Ambulatory Visit: Payer: Self-pay | Admitting: Family Medicine

## 2020-03-31 ENCOUNTER — Telehealth: Payer: Self-pay | Admitting: Cardiovascular Disease

## 2020-03-31 NOTE — Telephone Encounter (Signed)
Patient is returning a call. There are no notes, lab results, or imaging results to reference to. Please advise.   Patient did want to make the office aware that she has yet to put on her zio monitor due to her husband being hospitalized whom she has been taking care of. She stated that since she isn't sure how to put it on, her daughter, who is a Marine scientist, is coming to put it on for her.

## 2020-03-31 NOTE — Telephone Encounter (Signed)
Called patient back with her lab results. Patient stated she will get the monitor on this weekend.

## 2020-04-01 ENCOUNTER — Other Ambulatory Visit (INDEPENDENT_AMBULATORY_CARE_PROVIDER_SITE_OTHER): Payer: Medicare Other

## 2020-04-01 DIAGNOSIS — I4891 Unspecified atrial fibrillation: Secondary | ICD-10-CM

## 2020-04-10 ENCOUNTER — Telehealth: Payer: Self-pay | Admitting: Cardiovascular Disease

## 2020-04-10 MED ORDER — AMIODARONE HCL 200 MG PO TABS: 200.0000 mg | ORAL_TABLET | Freq: Every day | ORAL | 3 refills | Status: DC

## 2020-04-10 MED ORDER — CARVEDILOL 12.5 MG PO TABS
12.5000 mg | ORAL_TABLET | Freq: Two times a day (BID) | ORAL | 3 refills | Status: DC
Start: 2020-04-10 — End: 2020-04-19

## 2020-04-10 NOTE — Telephone Encounter (Signed)
Does not sound cardiac related would discuss with primary

## 2020-04-10 NOTE — Telephone Encounter (Signed)
Called patient to get more information about her BP and HR. Patient took BP while on the phone, BP 127/64 and HR 48 and 50. Patient was going to contact her PCP as well, since she is not feeling so well.  Consulted Truitt Merle NP on care team. She advised patient to cut her amiodarone to 200 mg daily, and Coreg to 12.5 mg by mouth twice daily. Patient will need to keep her appointment with [name] PA next week and get an EKG at that appointment. Informed patient if her symptoms did not improve or get worse to go to the ED. Patient verbalized understanding.

## 2020-04-10 NOTE — Telephone Encounter (Signed)
Spoke with Pt.  Per Pt she has been dizzy the last 3 days with nausea.  Also states her chest feels heavy.  Per her husband her BP "was normal"  Had husband palpate pulse, he states it feels regular.  They are unable to tell me what her pulse is.  Temperature is 98.4.  Per Pt she is taking amiodarone 200 mg PO twice a day.  Appears she has been on this dose since at least February 25, 2020.   Will forward to Dr. Johnsie Cancel and nurse for advisement.

## 2020-04-10 NOTE — Telephone Encounter (Signed)
STAT if patient feels like he/she is going to faint   1) Are you dizzy now? Yes   2) Do you feel faint or have you passed out? No  3) Do you have any other symptoms? Heaviness in chest & nausea  4) Have you checked your HR and BP (record if available)? Yes, seems fine   Erin Wong is calling stating since putting on her monitor on 04/01/20 she has been experiencing dizziness, nausea, and heaviness in her chest. She is wanting to know if this is a normal occurrence. Her monitor is not due to be taken off until this Saturday. Please advise.

## 2020-04-18 ENCOUNTER — Encounter: Payer: Medicare Other | Admitting: *Deleted

## 2020-04-18 ENCOUNTER — Other Ambulatory Visit: Payer: Self-pay

## 2020-04-18 VITALS — BP 115/54 | HR 50 | Ht 64.0 in | Wt 145.3 lb

## 2020-04-18 DIAGNOSIS — Z006 Encounter for examination for normal comparison and control in clinical research program: Secondary | ICD-10-CM

## 2020-04-18 NOTE — Progress Notes (Signed)
Cardiology Office Note    Date:  04/19/2020   ID:  Erin, Wong 12-11-1950, MRN [id number]  PCP:  [name], MD  Cardiologist: Jenkins Rouge, MD EPS: None  Chief Complaint  Patient presents with  . Follow-up    History of Present Illness:  Erin Wong is a 69 y.o. female with a history of persistent atrial fibrillation s/p failed DCCV on 01/17/2020 now on Amiodarone and Eliquis, follow-up cardioversion scheduled.  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  Truitt Merle, NP advised the patient to decrease amiodarone to 200 mg once daily and Coreg 12.5 mg twice daily.  She is here today for follow-up. Patient thinks her dizziness was from too much sugar. Was drinking coffee creamer 3 times/day and once she stopped that she felt better. No more dizziness. Was on a study drug but stopped yesterday. She went back up on Amiodarone 200 mg bid and carvedilol 25 mg bid. Was really swollen last week and O2 sats were in the 50's. Other people cook for her and she must have gotten extra salt. Most of this has resolved. O2 sat 96%.   Past Medical History:  Diagnosis Date  . Allergy   . Arthritis   . Asthma   . Atrial fibrillation ([place])   . Benign essential HTN   . CHF (congestive heart failure) ([place])   . CKD (chronic kidney disease) stage 3, GFR 30-59 ml/min   . HLD (hyperlipidemia)     Past Surgical History:  Procedure Laterality Date  . ABDOMINAL HYSTERECTOMY  10/04/1995   Hysterectomy and Bilateral oophorectomy  . CARDIOVERSION N/A 01/17/2020   Procedure: CARDIOVERSION;  Surgeon: [name], MD;  Location: [facility] ENDOSCOPY;  Service: Cardiovascular;  Laterality: N/A;  . COLON SURGERY  09/2009   hole in colon  . FRACTURE SURGERY Right  08/02/2011   Right Leg--Plates & Screws  . 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 TEE WITHOUT CARDIOVERSION N/A 01/17/2020   Procedure: TRANSESOPHAGEAL ECHOCARDIOGRAM (TEE);  Surgeon: [name], MD;  Location: [facility] ENDOSCOPY;  Service: Cardiovascular;  Laterality: N/A;    Current Medications: Current Meds  Medication Sig  . acetaminophen (TYLENOL) 650 MG CR tablet Take 2 tablets (1,300 mg total) by mouth 2 (two) times daily as needed for pain.  Marland Kitchen albuterol (PROVENTIL HFA;VENTOLIN HFA) 108 (90 Base) MCG/ACT inhaler Inhale 1 puff into the lungs every 6 (six) hours as needed for wheezing or shortness of breath.  Marland Kitchen amiodarone (PACERONE) 200 MG tablet Take 1 tablet (200 mg total) by mouth daily. (Patient taking differently: Take 200 mg by mouth 2 (two) times daily. )  . atorvastatin (LIPITOR) 40 MG tablet TAKE 1 TABLET BY MOUTH EVERY DAY  . benzonatate (TESSALON) 100 MG capsule Take 1 capsule (100 mg total) by mouth 3 (three) times daily as needed for cough.  . calcium gluconate 500 MG tablet Take 500 mg by mouth daily.   . carvedilol (COREG) 25 MG tablet Take 25 mg by mouth 2 (two) times daily with a meal.  . cholecalciferol (VITAMIN D) 1000 units tablet Take 1 tablet (1,000 Units total) by mouth daily.  Marland Kitchen ELIQUIS 5 MG TABS  tablet TAKE 1 TABLET BY MOUTH TWICE A DAY  . fluticasone (FLONASE) 50 MCG/ACT nasal spray Place 2 sprays into both nostrils daily.  . Fluticasone-Salmeterol (ADVAIR DISKUS) 100-50 MCG/DOSE AEPB INHALE 1 PUFF INTO THE LUNGS TWICE A DAY (Patient taking differently: Inhale 1 puff into the lungs in the morning and at bedtime. )  . furosemide (LASIX) 40 MG tablet Take 1 tablet (40 mg total) by mouth daily.  Marland Kitchen omeprazole (PRILOSEC) 20 MG capsule TAKE 1 CAPSULE BY MOUTH EVERY DAY (Patient taking differently: Take 20 mg by mouth daily. )  . polyethylene glycol (MIRALAX / GLYCOLAX) 17 g  packet Take 17 g by mouth daily.  . predniSONE (DELTASONE) 20 MG tablet Prednisone 40 mg daily for 3 days followed by  Prednisone 20 mg daily for 3 days.  . sacubitril-valsartan (ENTRESTO) 24-26 MG Take 1 tablet by mouth 2 (two) times daily.  . traMADol (ULTRAM) 50 MG tablet TAKE 2 TABLETS BY MOUTH 3 TIMES A DAY AS NEEDED FOR PAIN (Patient taking differently: Take 50 mg by mouth 3 (three) times daily as needed for moderate pain. )     Allergies:   Patient has no known allergies.   Social History   Socioeconomic History  . Marital status: Married    Spouse name: Not on file  . Number of children: Not on file  . Years of education: Not on file  . Highest education level: Not on file  Occupational History  . Not on file  Tobacco Use  . Smoking status: Former Smoker    Quit date: 01/10/1991    Years since quitting: 29.2  . Smokeless tobacco: Never Used  Vaping Use  . Vaping Use: Never used  Substance and Sexual Activity  . Alcohol use: No  . Drug use: No  . Sexual activity: Never  Other Topics Concern  . Not on file  Social History Narrative  . Not on file   Social Determinants of Health   Financial Resource Strain:   . Difficulty of Paying Living Expenses:   Food Insecurity:   . Worried About Charity fundraiser in the Last Year:   . Arboriculturist in the Last Year:   Transportation Needs:   . Film/video editor (Medical):   Marland Kitchen Lack of Transportation (Non-Medical):   Physical Activity:   . Days of Exercise per Week:   . Minutes of Exercise per Session:   Stress:   . Feeling of Stress :   Social Connections:   . Frequency of Communication with Friends and Family:   . Frequency of Social Gatherings with Friends and Family:   . Attends Religious Services:   . Active Member of Clubs or Organizations:   . Attends Archivist Meetings:   Marland Kitchen Marital Status:      Family History:  The patient's   family history includes Alcohol abuse in her brother and  father; Arthritis in her mother; Asthma in her maternal aunt; COPD in her brother; Cancer in her mother; Diabetes in her maternal aunt; Miscarriages / Stillbirths in her mother.   ROS:   Please see the history of present illness.    ROS All other systems reviewed and are negative.   PHYSICAL EXAM:   VS:  BP 118/64   Pulse (!) 52   Ht 5\' 4"  (1.626 m)   Wt 145 lb (65.8 kg)   SpO2 96%   BMI 24.89 kg/m   Physical Exam  GEN: Well nourished, well  developed, in no acute distress  Neck: no JVD, carotid bruits, or masses Cardiac:RRR; no murmurs, rubs, or gallops  Respiratory:  clear to auscultation bilaterally, normal work of breathing GI: soft, nontender, nondistended, + BS Ext: Trace of edema bilaterally right greater than left without cyanosis, clubbing, Good distal pulses bilaterally Neuro:  Alert and Oriented x 3 Psych: euthymic mood, full affect  Wt Readings from Last 3 Encounters:  04/19/20 145 lb (65.8 kg)  04/18/20 145 lb 4.8 oz (65.9 kg)  03/15/20 147 lb (66.7 kg)      Studies/Labs Reviewed:   EKG:  EKG is ordered today.  The ekg ordered today demonstrates sinus bradycardia at 52 bpm nonspecific ST-T wave changes, no acute change  Recent Labs: 01/13/2020: TSH 1.525 02/12/2020: B Natriuretic Peptide 602.0 02/14/2020: Magnesium 2.1 02/22/2020: ALT 32 03/13/2020: Hemoglobin 11.6; Platelets 451 04/18/2020: BUN 15; Creatinine, Ser 0.93; Potassium 4.8; Sodium 136   Lipid Panel    Component Value Date/Time   CHOL 139 01/14/2020 0601   TRIG 58 01/14/2020 0601   HDL 70 01/14/2020 0601   CHOLHDL 2.0 01/14/2020 0601   VLDL 12 01/14/2020 0601   LDLCALC 57 01/14/2020 0601   LDLCALC 51 01/03/2020 1548    Additional studies/ records that were reviewed today include:  Echocardiogram 01/14/2020: Impressions:  1. Left ventricular ejection fraction, by estimation, is 30 to 35%. The  left ventricle has moderately decreased function. The left ventricle  demonstrates global  hypokinesis. The left ventricular internal cavity size  was mildly dilated. Left ventricular  diastolic parameters are indeterminate. There is akinesis of the left  ventricular, mid-apical inferior wall, anterior wall, inferolateral wall,  anterolateral wall and lateral wall. There is akinesis of the left  ventricular, apical septal wall and apical  segment.   2. Right ventricular systolic function is mildly reduced. The right  ventricular size is mildly enlarged. There is moderately elevated  pulmonary artery systolic pressure. The estimated right ventricular  systolic pressure is 21.1 mmHg.   3. Left atrial size was severely dilated.   4. The mitral valve is normal in structure. Mild to moderate mitral valve  regurgitation. No evidence of mitral stenosis.   5. Tricuspid valve regurgitation is mild to moderate.   6. The aortic valve is normal in structure. Aortic valve regurgitation is  trivial. No aortic stenosis is present.   7. The inferior vena cava is dilated in size with <50% respiratory  variability, suggesting right atrial pressure of 15 mmHg.        ASSESSMENT:    1. Persistent atrial fibrillation (HCC)   2. Cardiomyopathy, unspecified type ([place])   3. Chronic systolic CHF (congestive heart failure) ([place])   4. Essential hypertension   5. Hyperlipidemia, unspecified hyperlipidemia type   6. Stage 3 chronic kidney disease, unspecified whether stage 3a or 3b CKD      PLAN:  In order of problems listed above:  Persistent atrial fibrillation with failed cardioversion 01/2020 started on amiodarone and also on Eliquis.  Was scheduled for cardioversion in July but had already converted spontaneously.  Called in with low heart rates in the 40s associated with dizziness Coreg decreased to half a tablet twice daily and amiodarone decreased to 200 mg once daily.  Patient went back on her higher dose meds because she thought her dizziness was from drinking coffee creamer 3 times  daily and when she stopped this her dizziness resolved.  Will await Holter monitor results before decreasing carvedilol.  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  On Entresto 24/26 mg twice daily carvedilol 25 mg twice daily and just finished a study drug yesterday.  Renal function normal yesterday.  We will continue current treatment.  May need to decrease carvedilol but will wait to get Holter monitor back.  Chronic systolic CHF sounds like patient had significant heart failure last week after getting extra salt in her diet.  She has a trace of edema today.  We will have her take an extra half a Lasix when she gets home today.  Reiterated 2 g sodium diet.  We will hold off on repeat echo at this time since she had evidence of CHF last week.  Essential hypertension blood pressure controlled  Hyperlipidemia on Lipitor LDL 57 on 01/14/2020  CKD stage III creatinine 9 3 yesterday.   Medication Adjustments/Labs and Tests Ordered: Current medicines are reviewed at length with the patient today.  Concerns regarding medicines are outlined above.  Medication changes, Labs and Tests ordered today are listed in the Patient Instructions below. There are no Patient Instructions on file for this visit.   Sumner Boast, PA-C  04/19/2020 11:38 AM    [facility] [address], [place], [place]  [address] Phone: [phone]; Fax: [phone]

## 2020-04-18 NOTE — Research (Signed)
    Month 2 Visit Worksheet  PATIENT ID: [id number]   DATE OF CONTACT:  04/18/2020   Type of Visit*:   *Visit should be done in person whenever possible  [x]  Clinic Visit                         []  Telephone Contact with Patient     []  Family Member or Caregiver  []  Primary Care Physician   []  Medical Record Review                    []  Other (specify)____________  1. Did patient experience any AEs leading to study drug discontinuation, SAEs related to study drug, protocol-defined unexpected/unanticipated events, clinical endpoints, or adverse events of special interest since the last visit?  []     Yes*  [x]     No  *If yes, please record on Event Log  2. Were there any changes to patient's medications since the last visit?   []     Yes*  [x]      No  *If yes, please record on ConMeds Log  3. Vital Signs  Pulse 50    Heigh:    64 []  cm  [x]   in (xxx.xx)  B/P 115/54   Body Weight 145.3 []  kg   [x]   lbs  (xxx.xx)  4. Local Labs Performed  Creatinine  [x]  Yes  []  No Potassium  [x]   Yes []  No  5. Is Patient currently taking study drug?  [x]  Yes    or   []  No*     *If no, describe reason:     6. Study Drug Compliance  Date last tablet taken:  04/18/2020  Date Returned: 04/18/2020 # Tablets Returned: __6__  How many days did patient NOT take the study drug since last visit?  ____0_____Days  Investigator judgment of Compliance: [x]  Reasonable (? 20% deviation from expected)        []  Questionable (>20% deviation from expected)       7.  Was [facility] questionnaire completed? [x]  Yes  []  No

## 2020-04-19 ENCOUNTER — Encounter: Payer: Self-pay | Admitting: Physician Assistant

## 2020-04-19 ENCOUNTER — Ambulatory Visit (INDEPENDENT_AMBULATORY_CARE_PROVIDER_SITE_OTHER): Payer: Medicare Other | Admitting: Physician Assistant

## 2020-04-19 VITALS — BP 118/64 | HR 52 | Ht 64.0 in | Wt 145.0 lb

## 2020-04-19 DIAGNOSIS — I5022 Chronic systolic (congestive) heart failure: Secondary | ICD-10-CM | POA: Diagnosis not present

## 2020-04-19 DIAGNOSIS — E785 Hyperlipidemia, unspecified: Secondary | ICD-10-CM

## 2020-04-19 DIAGNOSIS — I429 Cardiomyopathy, unspecified: Secondary | ICD-10-CM

## 2020-04-19 DIAGNOSIS — I10 Essential (primary) hypertension: Secondary | ICD-10-CM

## 2020-04-19 DIAGNOSIS — N183 Chronic kidney disease, stage 3 unspecified: Secondary | ICD-10-CM

## 2020-04-19 DIAGNOSIS — I4819 Other persistent atrial fibrillation: Secondary | ICD-10-CM

## 2020-04-19 LAB — BASIC METABOLIC PANEL
BUN/Creatinine Ratio: 16 (ref 12–28)
BUN: 15 mg/dL (ref 8–27)
CO2: 24 mmol/L (ref 20–29)
Calcium: 9.6 mg/dL (ref 8.7–10.3)
Chloride: 96 mmol/L (ref 96–106)
Creatinine, Ser: 0.93 mg/dL (ref 0.57–1.00)
GFR calc Af Amer: 72 mL/min/{1.73_m2} (ref >59)
GFR calc non Af Amer: 62 mL/min/{1.73_m2} (ref >59)
Glucose: 112 mg/dL — ABNORMAL HIGH (ref 65–99)
Potassium: 4.8 mmol/L (ref 3.5–5.2)
Sodium: 136 mmol/L (ref 134–144)

## 2020-04-19 MED ORDER — AMIODARONE HCL 200 MG PO TABS: 200.0000 mg | ORAL_TABLET | Freq: Every day | ORAL | 3 refills | Status: DC

## 2020-04-19 NOTE — Patient Instructions (Signed)
Medication Instructions:  Your physician has recommended you make the following change in your medication:   DECREASE Amiodarone to 200mg  daily Continue taking Carvedilol 25mg  twice daily  *If you need a refill on your cardiac medications before your next appointment, please call your pharmacy*   Lab Work: None If you have labs (blood work) drawn today and your tests are completely normal, you will receive your results only by: Marland Kitchen MyChart Message (if you have MyChart) OR . A paper copy in the mail If you have any lab test that is abnormal or we need to change your treatment, we will call you to review the results.   Testing/Procedures: None   Follow-Up: At [facility], you and your health needs are our priority.  As part of our continuing mission to provide you with exceptional heart care, we have created designated Provider Care Teams.  These Care Teams include your primary Cardiologist (physician) and Advanced Practice Providers (APPs -  Physician Assistants and Nurse Practitioners) who all work together to provide you with the care you need, when you need it.  We recommend signing up for the patient portal called "MyChart".  Sign up information is provided on this After Visit Summary.  MyChart is used to connect with patients for Virtual Visits (Telemedicine).  Patients are able to view lab/test results, encounter notes, upcoming appointments, etc.  Non-urgent messages can be sent to your provider as well.   To learn more about what you can do with MyChart, go to [email or web address].    Your next appointment:   05/23/2020  The format for your next appointment:   In Person  Provider:   Truitt Merle, NP   Other Instructions Take an extra 1/2 tablet of Furosemide TODAY only  Two Gram Sodium Diet 2000 mg  What is Sodium? Sodium is a mineral found naturally in many foods. The most significant source of sodium in the diet is table salt, which is about 40% sodium.   Processed, convenience, and preserved foods also contain a large amount of sodium.  The body needs only 500 mg of sodium daily to function,  A normal diet provides more than enough sodium even if you do not use salt.  Why Limit Sodium? A build up of sodium in the body can cause thirst, increased blood pressure, shortness of breath, and water retention.  Decreasing sodium in the diet can reduce edema and risk of heart attack or stroke associated with high blood pressure.  Keep in mind that there are many other factors involved in these health problems.  Heredity, obesity, lack of exercise, cigarette smoking, stress and what you eat all play a role.  General Guidelines:  Do not add salt at the table or in cooking.  One teaspoon of salt contains over 2 grams of sodium.  Read food labels  Avoid processed and convenience foods  Ask your dietitian before eating any foods not dicussed in the menu planning guidelines  Consult your physician if you wish to use a salt substitute or a sodium containing medication such as antacids.  Limit milk and milk products to 16 oz (2 cups) per day.  Shopping Hints:  READ LABELS!! "Dietetic" does not necessarily mean low sodium.  Salt and other sodium ingredients are often added to foods during processing.   Menu Planning Guidelines Food Group Choose More Often Avoid  Beverages (see also the milk group All fruit juices, low-sodium, salt-free vegetables juices, low-sodium carbonated beverages Regular vegetable or tomato juices, commercially softened  water used for drinking or cooking  Breads and Cereals Enriched white, wheat, rye and pumpernickel bread, hard rolls and dinner rolls; muffins, cornbread and waffles; most dry cereals, cooked cereal without added salt; unsalted crackers and breadsticks; low sodium or homemade bread crumbs Bread, rolls and crackers with salted tops; quick breads; instant hot cereals; pancakes; commercial bread stuffing; self-rising  flower and biscuit mixes; regular bread crumbs or cracker crumbs  Desserts and Sweets Desserts and sweets mad with mild should be within allowance Instant pudding mixes and cake mixes  Fats Butter or margarine; vegetable oils; unsalted salad dressings, regular salad dressings limited to 1 Tbs; light, sour and heavy cream Regular salad dressings containing bacon fat, bacon bits, and salt pork; snack dips made with instant soup mixes or processed cheese; salted nuts  Fruits Most fresh, frozen and canned fruits Fruits processed with salt or sodium-containing ingredient (some dried fruits are processed with sodium sulfites        Vegetables Fresh, frozen vegetables and low- sodium canned vegetables Regular canned vegetables, sauerkraut, pickled vegetables, and others prepared in brine; frozen vegetables in sauces; vegetables seasoned with ham, bacon or salt pork  Condiments, Sauces, Miscellaneous  Salt substitute with physician's approval; pepper, herbs, spices; vinegar, lemon or lime juice; hot pepper sauce; garlic powder, onion powder, low sodium soy sauce (1 Tbs.); low sodium condiments (ketchup, chili sauce, mustard) in limited amounts (1 tsp.) fresh ground horseradish; unsalted tortilla chips, pretzels, potato chips, popcorn, salsa (1/4 cup) Any seasoning made with salt including garlic salt, celery salt, onion salt, and seasoned salt; sea salt, rock salt, kosher salt; meat tenderizers; monosodium glutamate; mustard, regular soy sauce, barbecue, sauce, chili sauce, teriyaki sauce, steak sauce, Worcestershire sauce, and most flavored vinegars; canned gravy and mixes; regular condiments; salted snack foods, olives, picles, relish, horseradish sauce, catsup   Food preparation: Try these seasonings Meats:    Pork Sage, onion Serve with applesauce  Chicken Poultry seasoning, thyme, parsley Serve with cranberry sauce  Lamb Curry powder, rosemary, garlic, thyme Serve with mint sauce or jelly  Veal  Marjoram, basil Serve with current jelly, cranberry sauce  Beef Pepper, bay leaf Serve with dry mustard, unsalted chive butter  Fish [place] leaf, dill Serve with unsalted lemon butter, unsalted parsley butter  Vegetables:    Asparagus Lemon juice   Broccoli Lemon juice   Carrots Mustard dressing parsley, mint, nutmeg, glazed with unsalted butter and sugar   Green beans Marjoram, lemon juice, nutmeg,dill seed   Tomatoes Basil, marjoram, onion   Spice /blend for [employer]" 4 tsp ground thyme 1 tsp ground sage 3 tsp ground rosemary 4 tsp ground marjoram   Test your knowledge 1. A product that says "Salt Free" may still contain sodium. True or False 2. Garlic Powder and Hot Pepper Sauce an be used as alternative seasonings.True or False 3. Processed foods have more sodium than fresh foods.  True or False 4. Canned Vegetables have less sodium than froze True or False  WAYS TO DECREASE YOUR SODIUM INTAKE 1. Avoid the use of added salt in cooking and at the table.  Table salt (and other prepared seasonings which contain salt) is probably one of the greatest sources of sodium in the diet.  Unsalted foods can gain flavor from the sweet, sour, and butter taste sensations of herbs and spices.  Instead of using salt for seasoning, try the following seasonings with the foods listed.  Remember: how you use them to enhance natural food flavors is limited  only by your creativity... Allspice-Meat, fish, eggs, fruit, peas, red and yellow vegetables Almond Extract-Fruit baked goods Anise Seed-Sweet breads, fruit, carrots, beets, cottage cheese, cookies (tastes like licorice) Basil-Meat, fish, eggs, vegetables, rice, vegetables salads, soups, sauces Bay Leaf-Meat, fish, stews, poultry Burnet-Salad, vegetables (cucumber-like flavor) Caraway Seed-Bread, cookies, cottage cheese, meat, vegetables, cheese, rice Cardamon-Baked goods, fruit, soups Celery Powder or seed-Salads, salad dressings, sauces, meatloaf,  soup, bread.Do not use  celery salt Chervil-Meats, salads, fish, eggs, vegetables, cottage cheese (parsley-like flavor) Chili Power-Meatloaf, chicken cheese, corn, eggplant, egg dishes Chives-Salads cottage cheese, egg dishes, soups, vegetables, sauces Cilantro-Salsa, casseroles Cinnamon-Baked goods, fruit, pork, lamb, chicken, carrots Cloves-Fruit, baked goods, fish, pot roast, green beans, beets, carrots Coriander-Pastry, cookies, meat, salads, cheese (lemon-orange flavor) Cumin-Meatloaf, fish,cheese, eggs, cabbage,fruit pie (caraway flavor) [employer], fruit, eggs, fish, poultry, cottage cheese, vegetables Dill Seed-Meat, cottage cheese, poultry, vegetables, fish, salads, bread Fennel Seed-Bread, cookies, apples, pork, eggs, fish, beets, cabbage, cheese, Licorice-like flavor Garlic-(buds or powder) Salads, meat, poultry, fish, bread, butter, vegetables, potatoes.Do not  use garlic salt Ginger-Fruit, vegetables, baked goods, meat, fish, poultry Horseradish Root-Meet, vegetables, butter Lemon Juice or Extract-Vegetables, fruit, tea, baked goods, fish salads Mace-Baked goods fruit, vegetables, fish, poultry (taste like nutmeg) Maple Extract-Syrups Marjoram-Meat, chicken, fish, vegetables, breads, green salads (taste like Sage) Mint-Tea, lamb, sherbet, vegetables, desserts, carrots, cabbage Mustard, Dry or Seed-Cheese, eggs, meats, vegetables, poultry Nutmeg-Baked goods, fruit, chicken, eggs, vegetables, desserts Onion Powder-Meat, fish, poultry, vegetables, cheese, eggs, bread, rice salads (Do not use   Onion salt) Orange Extract-Desserts, baked goods Oregano-Pasta, eggs, cheese, onions, pork, lamb, fish, chicken, vegetables, green salads Paprika-Meat, fish, poultry, eggs, cheese, vegetables Parsley Flakes-Butter, vegetables, meat fish, poultry, eggs, bread, salads (certain forms may   Contain sodium Pepper-Meat fish, poultry, vegetables, eggs Peppermint Extract-Desserts, baked  goods Poppy Seed-Eggs, bread, cheese, fruit dressings, baked goods, noodles, vegetables, cottage  [employer], poultry, meat, fish, cauliflower, turnips,eggs bread Saffron-Rice, bread, veal, chicken, fish, eggs Sage-Meat, fish, poultry, onions, eggplant, tomateos, pork, stews Savory-Eggs, salads, poultry, meat, rice, vegetables, soups, pork Tarragon-Meat, poultry, fish, eggs, butter, vegetables (licorice-like flavor)  Thyme-Meat, poultry, fish, eggs, vegetables, (clover-like flavor), sauces, soups Tumeric-Salads, butter, eggs, fish, rice, vegetables (saffron-like flavor) Vanilla Extract-Baked goods, candy Vinegar-Salads, vegetables, meat marinades Walnut Extract-baked goods, candy  2. Choose your Foods Wisely   The following is a list of foods to avoid which are high in sodium:  Meats-Avoid all smoked, canned, salt cured, dried and kosher meat and fish as well as Anchovies   [employer] meats:Bologna, Liverwurst, Pastrami Canned meat or fish  Marinated herring Caviar    Pepperoni Corned Beef   Pizza Dried chipped beef  Salami Frozen breaded fish or meat Salt pork Frankfurters or hot dogs  Sardines Gefilte fish   Sausage Ham (boiled ham, Proscuitto Smoked butt    spiced ham)   Spam      TV Dinners Vegetables Canned vegetables (Regular) Relish Canned mushrooms  Sauerkraut Olives    Tomato juice Pickles  Bakery and Dessert Products Canned puddings  Cream pies Cheesecake   Decorated cakes Cookies  Beverages/Juices Tomato juice, regular  Gatorade   V-8 vegetable juice, regular  Breads and Cereals Biscuit mixes   Salted potato chips, corn chips, pretzels Bread stuffing mixes  Salted crackers and rolls Pancake and waffle mixes Self-rising flour  Seasonings Accent    Meat sauces Barbecue sauce  Meat tenderizer Catsup    Monosodium glutamate (MSG) Celery salt   Onion salt  Chili sauce   Prepared mustard Garlic  salt   Salt, seasoned salt, sea salt Gravy mixes   Soy sauce Horseradish   Steak sauce Ketchup   Tartar sauce Lite salt    Teriyaki sauce Marinade mixes   Worcestershire sauce  Others Baking powder   Cocoa and cocoa mixes Baking soda   Commercial casserole mixes Candy-caramels, chocolate  Dehydrated soups    Bars, fudge,nougats  Instant rice and pasta mixes Canned broth or soup  Maraschino cherries Cheese, aged and processed cheese and cheese spreads  Learning Assessment Quiz  Indicated T (for True) or F (for False) for each of the following statements:  1. _____ Fresh fruits and vegetables and unprocessed grains are generally low in sodium 2. _____ Water may contain a considerable amount of sodium, depending on the source 3. _____ You can always tell if a food is high in sodium by tasting it 4. _____ Certain laxatives my be high in sodium and should be avoided unless prescribed   by a physician or pharmacist 5. _____ Salt substitutes may be used freely by anyone on a sodium restricted diet 6. _____ Sodium is present in table salt, food additives and as a natural component of   most foods 7. _____ Table salt is approximately 90% sodium 8. _____ Limiting sodium intake may help prevent excess fluid accumulation in the body 9. _____ On a sodium-restricted diet, seasonings such as bouillon soy sauce, and    cooking wine should be used in place of table salt 10. _____ On an ingredient list, a product which lists monosodium glutamate as the first   ingredient is an appropriate food to include on a low sodium diet  Circle the best answer(s) to the following statements (Hint: there may be more than one correct answer)  11. On a low-sodium diet, some acceptable snack items are:    A. Olives  F. Bean dip   K. Grapefruit juice    B. Salted Pretzels G. Commercial Popcorn   L. Canned peaches    C. Carrot Sticks  H. Bouillon   M. Unsalted nuts   D. [place] fries  I. Peanut butter crackers N.  Salami   E. Sweet pickles J. Tomato Juice   O. Pizza  12.  Seasonings that may be used freely on a reduced - sodium diet include   A. Lemon wedges F.Monosodium glutamate K. Celery seed    B.Soysauce   G. Pepper   L. Mustard powder   C. Sea salt  H. Cooking wine  M. Onion flakes   D. Vinegar  E. Prepared horseradish N. Salsa   E. Sage   J. Worcestershire sauce  O. Chutney

## 2020-04-25 ENCOUNTER — Other Ambulatory Visit: Payer: Self-pay

## 2020-04-25 ENCOUNTER — Ambulatory Visit (INDEPENDENT_AMBULATORY_CARE_PROVIDER_SITE_OTHER): Payer: Medicare Other | Admitting: Family Medicine

## 2020-04-25 VITALS — BP 102/64 | HR 56 | Temp 98.1°F | Ht 64.0 in | Wt 145.0 lb

## 2020-04-25 DIAGNOSIS — G8929 Other chronic pain: Secondary | ICD-10-CM

## 2020-04-25 DIAGNOSIS — M25561 Pain in right knee: Secondary | ICD-10-CM

## 2020-04-25 DIAGNOSIS — I4891 Unspecified atrial fibrillation: Secondary | ICD-10-CM | POA: Diagnosis not present

## 2020-04-25 NOTE — Progress Notes (Signed)
Subjective:    Patient ID: Erin Wong, female    DOB: 1950-11-06, 69 y.o.   MRN: [id number]  Patient has chronic pain in her right knee.  Patient has pain with ambulation.  She has pain with standing.  Last cortisone injection was more than 3 months ago.  She is requesting a repeat cortisone injection.  She has not followed up yet with her gastroenterologist regarding her positive Cologuard screening. Past Medical History:  Diagnosis Date   Allergy    Arthritis    Asthma    Atrial fibrillation (HCC)    Benign essential HTN    CHF (congestive heart failure) (HCC)    CKD (chronic kidney disease) stage 3, GFR 30-59 ml/min    HLD (hyperlipidemia)    Past Surgical History:  Procedure Laterality Date   ABDOMINAL HYSTERECTOMY  10/04/1995   Hysterectomy and Bilateral oophorectomy   CARDIOVERSION N/A 01/17/2020   Procedure: CARDIOVERSION;  Surgeon: [name], MD;  Location: [facility];  Service: Cardiovascular;  Laterality: N/A;   COLON SURGERY  09/2009   hole in colon   FRACTURE SURGERY Right 08/02/2011   Right Leg--Plates & Screws   HAND SURGERY Right 09/02/2006   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--Arthritis   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   at time of colon surgery   TEE WITHOUT CARDIOVERSION N/A 01/17/2020   Procedure: TRANSESOPHAGEAL ECHOCARDIOGRAM (TEE);  Surgeon: [name], MD;  Location: [facility] ENDOSCOPY;  Service: Cardiovascular;  Laterality: N/A;   Current Outpatient Medications on File Prior to Visit  Medication Sig Dispense Refill   acetaminophen (TYLENOL) 650 MG CR tablet Take 2 tablets (1,300 mg total) by mouth 2 (two) times daily as needed for pain. 120 tablet 3   albuterol (PROVENTIL HFA;VENTOLIN HFA) 108 (90 Base) MCG/ACT inhaler Inhale 1 puff into the lungs every 6 (six) hours as needed for wheezing or shortness of breath. 18 g 3   amiodarone (PACERONE) 200 MG tablet Take 1 tablet (200 mg total) by mouth daily. 90 tablet 3   atorvastatin (LIPITOR)  40 MG tablet TAKE 1 TABLET BY MOUTH EVERY DAY 90 tablet 1   benzonatate (TESSALON) 100 MG capsule Take 1 capsule (100 mg total) by mouth 3 (three) times daily as needed for cough. 20 capsule 0   calcium gluconate 500 MG tablet Take 500 mg by mouth daily.      carvedilol (COREG) 25 MG tablet Take 25 mg by mouth 2 (two) times daily with a meal.     cholecalciferol (VITAMIN D) 1000 units tablet Take 1 tablet (1,000 Units total) by mouth daily. 90 tablet 1   ELIQUIS 5 MG TABS tablet TAKE 1 TABLET BY MOUTH TWICE A DAY 60 tablet 5   fluticasone (FLONASE) 50 MCG/ACT nasal spray Place 2 sprays into both nostrils daily. 16 g 6   Fluticasone-Salmeterol (ADVAIR DISKUS) 100-50 MCG/DOSE AEPB INHALE 1 PUFF INTO THE LUNGS TWICE A DAY (Patient taking differently: Inhale 1 puff into the lungs in the morning and at bedtime. ) 60 each 5   furosemide (LASIX) 40 MG tablet Take 1 tablet (40 mg total) by mouth daily. 90 tablet 3   omeprazole (PRILOSEC) 20 MG capsule TAKE 1 CAPSULE BY MOUTH EVERY DAY (Patient taking differently: Take 20 mg by mouth daily. ) 90 capsule 3   polyethylene glycol (MIRALAX / GLYCOLAX) 17 g packet Take 17 g by mouth daily.     sacubitril-valsartan (ENTRESTO) 24-26 MG Take 1 tablet by mouth 2 (two) times daily. [place]  tablet 1   traMADol (ULTRAM) 50 MG tablet TAKE 2 TABLETS BY MOUTH 3 TIMES A DAY AS NEEDED FOR PAIN (Patient taking differently: Take 50 mg by mouth 3 (three) times daily as needed for moderate pain. ) 180 tablet 2   No current facility-administered medications on file prior to visit.   No Known Allergies Social History   Socioeconomic History   Marital status: Married    Spouse name: Not on file   Number of children: Not on file   Years of education: Not on file   Highest education level: Not on file  Occupational History   Not on file  Tobacco Use   Smoking status: Former Smoker    Quit date: 01/10/1991    Years since quitting: 29.3   Smokeless tobacco:  Never Used  Vaping Use   Vaping Use: Never used  Substance and Sexual Activity   Alcohol use: No   Drug use: No   Sexual activity: Never  Other Topics Concern   Not on file  Social History Narrative   Not on file   Social Determinants of Health   Financial Resource Strain:    Difficulty of Paying Living Expenses: Not on file  Food Insecurity:    Worried About Charity fundraiser in the Last Year: Not on file   [employer] of Food in the Last Year: Not on file  Transportation Needs:    Lack of Transportation (Medical): Not on file   Lack of Transportation (Non-Medical): Not on file  Physical Activity:    Days of Exercise per Week: Not on file   Minutes of Exercise per Session: Not on file  Stress:    Feeling of Stress : Not on file  Social Connections:    Frequency of Communication with Friends and Family: Not on file   Frequency of Social Gatherings with Friends and Family: Not on file   Attends Religious Services: Not on file   Active Member of Clubs or Organizations: Not on file   Attends Archivist Meetings: Not on file   Marital Status: Not on file  Intimate Partner Violence:    Fear of Current or Ex-Partner: Not on file   Emotionally Abused: Not on file   Physically Abused: Not on file   Sexually Abused: Not on file     Review of Systems  All other systems reviewed and are negative.      Objective:   Physical Exam Vitals reviewed.  Constitutional:      Appearance: She is well-developed.  Cardiovascular:     Rate and Rhythm: Normal rate and regular rhythm.     Heart sounds: Normal heart sounds. No murmur heard.  No friction rub. No gallop.   Pulmonary:     Effort: Pulmonary effort is normal. No respiratory distress.     Breath sounds: Normal breath sounds. No wheezing or rales.  Abdominal:     General: Bowel sounds are normal. There is no distension.     Palpations: Abdomen is soft. There is no mass.     Tenderness: There  is no abdominal tenderness. There is no guarding or rebound.  Musculoskeletal:     Right knee: Effusion present. Decreased range of motion. Tenderness present over the medial joint line and lateral joint line.           Assessment & Plan:  Chronic pain of right knee   Using sterile technique, the right knee was injected with 2 cc lidocaine, 2  cc of Marcaine, and 2 cc of 40 mg/mL Kenalog.  Patient tolerated the procedure well without complication. My staff provided the patient with the contact information for her gastroenterologist so that she can schedule a follow-up appointment.  She missed her initial appointment due to her husband being in the hospital as well as her hospitalization.

## 2020-05-01 ENCOUNTER — Encounter: Payer: Self-pay | Admitting: Gastroenterology

## 2020-05-15 NOTE — Progress Notes (Signed)
CARDIOLOGY OFFICE NOTE  Date:  05/23/2020    Erin Wong Date of Birth: [birth year] Medical Record #[id number]  PCP:  [name], MD  Cardiologist:  Johnsie Cancel   Chief Complaint  Patient presents with  . Follow-up    History of Present Illness: Erin Wong is a 69 y.o. female who presents today for a follow up visit. Seen for Dr. Johnsie Cancel.   She has a history of persistentAF s/p failed DCCV on 01/17/2020 - placed on Amiodarone and Eliquis with plans for repeat cardioversion - however was in NSR. 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   Plan was to have repeat echo after 3 months of therapy - if no improvement in EF - would need to consider ischemic work up.   Last seen last month by [name], PA - had called in with low heart rates - amiodarone and Coreg were cut back and she was asked to come in for EKG. On exam she was doing ok - had increased her medicines back up - she thought her dizziness was from too much sugar. Had had some volume overload with low sats noted that had resolved.   Holter was done - less than 1% AF burden - average HR was 51. Her regimen was continued.    Comes in today. Here with her husband. She does not feel good. No energy. Dizzy. She saw her PCP last Friday - having headaches and an ear ache - he stopped her Entresto and gave her a muscle relaxer. She is still dizzy. She was dizzy with getting up out from the waiting room. BP is pretty soft.  He notes she grits her teeth at night - husband wonders if this causes her headaches. Breathing with a mask is very hard for her. Her weight is fairly stable - she is trying to restrict her salt. She is not really short of breath.   Past Medical History:  Diagnosis Date  . Allergy   . Arthritis   . Asthma   . Atrial fibrillation ([place])   . Benign essential HTN   . CHF  (congestive heart failure) ([place])   . CKD (chronic kidney disease) stage 3, GFR 30-59 ml/min   . HLD (hyperlipidemia)     Past Surgical History:  Procedure Laterality Date  . ABDOMINAL HYSTERECTOMY  10/04/1995   Hysterectomy and Bilateral oophorectomy  . CARDIOVERSION N/A 01/17/2020   Procedure: CARDIOVERSION;  Surgeon: [name], MD;  Location: [facility] ENDOSCOPY;  Service: Cardiovascular;  Laterality: N/A;  . COLON SURGERY  09/2009   hole in colon  . FRACTURE SURGERY Right 08/02/2011   Right Leg--Plates & Screws  . 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 TEE WITHOUT CARDIOVERSION N/A 01/17/2020   Procedure: TRANSESOPHAGEAL ECHOCARDIOGRAM (TEE);  Surgeon: [name], MD;  Location: [facility] ENDOSCOPY;  Service: Cardiovascular;  Laterality: N/A;     Medications: Current Meds  Medication Sig  . albuterol (PROVENTIL HFA;VENTOLIN HFA) 108 (90 Base) MCG/ACT inhaler Inhale 1 puff into the lungs every 6 (six) hours as needed for wheezing or shortness of breath.  Marland Kitchen amiodarone (PACERONE) 200 MG tablet Take 1 tablet (200 mg total) by mouth daily.  Marland Kitchen atorvastatin (LIPITOR) 40 MG tablet TAKE 1 TABLET BY MOUTH EVERY  DAY  . calcium gluconate 500 MG tablet Take 500 mg by mouth daily.   . carvedilol (COREG) 25 MG tablet Take 12.5 mg by mouth 2 (two) times daily with a meal.  . cholecalciferol (VITAMIN D) 1000 units tablet Take 1 tablet (1,000 Units total) by mouth daily.  Marland Kitchen ELIQUIS 5 MG TABS tablet TAKE 1 TABLET BY MOUTH TWICE A DAY  . fluticasone (FLONASE) 50 MCG/ACT nasal spray Place 2 sprays into both nostrils daily.  . Fluticasone-Salmeterol (ADVAIR DISKUS) 100-50 MCG/DOSE AEPB INHALE 1 PUFF INTO THE LUNGS TWICE A DAY  . furosemide (LASIX) 40 MG tablet Take 1 tablet (40 mg total) by mouth daily.  Marland Kitchen omeprazole (PRILOSEC) 20 MG capsule TAKE 1 CAPSULE BY MOUTH EVERY DAY  . polyethylene glycol (MIRALAX /  GLYCOLAX) 17 g packet Take 17 g by mouth daily.  Marland Kitchen tiZANidine (ZANAFLEX) 4 MG tablet Take 1 tablet (4 mg total) by mouth every 6 (six) hours as needed (headaches and ear pain).  . traMADol (ULTRAM) 50 MG tablet TAKE 2 TABLETS BY MOUTH 3 TIMES A DAY AS NEEDED FOR PAIN     Allergies: No Known Allergies  Social History: The patient  reports that she quit smoking about 29 years ago. She has never used smokeless tobacco. She reports that she does not drink alcohol and does not use drugs.   Family History: The patient's family history includes Alcohol abuse in her brother and father; Arthritis in her mother; Asthma in her maternal aunt; COPD in her brother; Cancer in her mother; Diabetes in her maternal aunt; Miscarriages / Stillbirths in her mother.   Review of Systems: Please see the history of present illness.   All other systems are reviewed and negative.   Physical Exam: VS:  BP (!) 90/50   Pulse (!) 46   Ht 5\' 4"  (1.626 m)   Wt 143 lb 3.2 oz (65 kg)   SpO2 96%   BMI 24.58 kg/m  .  BMI Body mass index is 24.58 kg/m.  Wt Readings from Last 3 Encounters:  05/23/20 143 lb 3.2 oz (65 kg)  05/19/20 144 lb (65.3 kg)  04/25/20 145 lb (65.8 kg)   BP is 80/60 by me.   General: Pleasant. Alert and in no acute distress. Her color looks sallow to me.   Cardiac: Regular rate and rhythm but slow. No murmurs, rubs, or gallops. No edema.  Respiratory:  Lungs are clear to auscultation bilaterally with normal work of breathing.  GI: Soft and nontender.  MS: No deformity or atrophy. Gait and ROM intact.  Skin: Warm and dry. Color is normal.  Neuro:  Strength and sensation are intact and no gross focal deficits noted.  Psych: Alert, appropriate and with normal affect.   LABORATORY DATA:  EKG:  EKG is ordered today.  Personally reviewed by me. This demonstrates marked sinus bradycardia - HR is 46.  Lab Results  Component Value Date   WBC 9.7 05/19/2020   HGB 12.3 05/19/2020   HCT 36.3  05/19/2020   PLT 465 (H) 05/19/2020   GLUCOSE 86 05/19/2020   CHOL 139 01/14/2020   TRIG 58 01/14/2020   HDL 70 01/14/2020   LDLCALC 57 01/14/2020   ALT 32 (H) 02/22/2020   AST 21 02/22/2020   NA 134 (L) 05/19/2020   K 4.3 05/19/2020   CL 97 (L) 05/19/2020   CREATININE 0.92 05/19/2020   BUN 13 05/19/2020   CO2 26 05/19/2020   TSH 1.525 01/13/2020  INR 1.4 (H) 02/13/2020     BNP (last 3 results) Recent Labs    01/13/20 1418 02/12/20 0030  BNP 983.1* 602.0*    ProBNP (last 3 results) No results for input(s): PROBNP in the last 8760 hours.   Other Studies Reviewed Today:  Monitor Study Highlights 04/2020  SR/SB average HR 51 bpm < 1% burden PAF PAC's < 1% total beats     Echocardiogram 01/14/2020: Impressions: 1. Left ventricular ejection fraction, by estimation, is 30 to 35%. The  left ventricle has moderately decreased function. The left ventricle  demonstrates global hypokinesis. The left ventricular internal cavity size  was mildly dilated. Left ventricular  diastolic parameters are indeterminate. There is akinesis of the left  ventricular, mid-apical inferior wall, anterior wall, inferolateral wall,  anterolateral wall and lateral wall. There is akinesis of the left  ventricular, apical septal wall and apical  segment.  2. Right ventricular systolic function is mildly reduced. The right  ventricular size is mildly enlarged. There is moderately elevated  pulmonary artery systolic pressure. The estimated right ventricular  systolic pressure is 83.6 mmHg.  3. Left atrial size was severely dilated.  4. The mitral valve is normal in structure. Mild to moderate mitral valve  regurgitation. No evidence of mitral stenosis.  5. Tricuspid valve regurgitation is mild to moderate.  6. The aortic valve is normal in structure. Aortic valve regurgitation is  trivial. No aortic stenosis is present.  7. The inferior vena cava is dilated in size with <50%  respiratory  variability, suggesting right atrial pressure of 15 mmHg.      ASSESSMENT & PLAN:    1. Persistent AF - remains in sinus but bradycardic - remains on Eliquis as well. She is on both Coreg and Amiodarone - I am cutting Coreg back today.   2. High risk medicine - recent labs noted - see below.   3. Chronic anticoagulation - recent CBC noted. No bleeding. On Eliquis.  4. Chronic systolic HF/possible tachycardia mediated CM - BP is quite low - she looks puny to me - Her heart rate is low as well - I think the combination is what is making her feel so poorly. Off Entresto for the past few days. Cutting Coreg back today to 12.5 mg BID - to start this tomorrow (no more today). Needs limited echo. If EF has failed to improve - she will need an ischemic work up. Further disposition to follow.    5. HTN - BP pretty soft here today and she is symptomatic. See #4.   6. HLD - on statin therapy.   7. CKD - lab from last week was ok.   Current medicines are reviewed with the patient today.  The patient does not have concerns regarding medicines other than what has been noted above.  The following changes have been made:  See above.  Labs/ tests ordered today include:    Orders Placed This Encounter  Procedures  . EKG 12-Lead  . ECHOCARDIOGRAM LIMITED     Disposition:   FU with me after echo for further disposition.    Patient is agreeable to this plan and will call if any problems develop in the interim.   SignedTruitt Merle, NP  05/23/2020 11:22 AM  [facility] [address] [place], [place]  [address] Phone: [phone] Fax: [phone]

## 2020-05-16 ENCOUNTER — Telehealth: Payer: Self-pay | Admitting: Nurse Practitioner

## 2020-05-16 NOTE — Telephone Encounter (Signed)
Tried to call patient. Phone just rang and rang with no answer. Will try again later.

## 2020-05-16 NOTE — Telephone Encounter (Signed)
New message:    Patient calling stating that she is sick and yesterday her stool was black. Please call patient.

## 2020-05-16 NOTE — Telephone Encounter (Signed)
Patient aware of Dr. Nishan's advisement.  

## 2020-05-16 NOTE — Telephone Encounter (Signed)
Called patient back about her message. Patient complaining about dizziness, h/a, upset stomach, and black stool. Patient has had only one black stool, and her other symptoms have been going on for a little over a week. Patient stated her BP 106/56 and HR 57. Patient stated she is seeing her PCP this Friday, and she is suppose to be getting a colonoscopy in the near future. Patient has follow up with Truitt Merle NP next week. Patient wanted Dr. Johnsie Cancel to know what was going on. Will forward to Dr. Johnsie Cancel.

## 2020-05-16 NOTE — Telephone Encounter (Signed)
Sounds like she needs to see her primary and GI

## 2020-05-19 ENCOUNTER — Ambulatory Visit (INDEPENDENT_AMBULATORY_CARE_PROVIDER_SITE_OTHER): Payer: Medicare Other | Admitting: Family Medicine

## 2020-05-19 VITALS — BP 104/62 | HR 51 | Temp 97.7°F | Resp 18 | Wt 144.0 lb

## 2020-05-19 DIAGNOSIS — I9589 Other hypotension: Secondary | ICD-10-CM | POA: Diagnosis not present

## 2020-05-19 DIAGNOSIS — M26609 Unspecified temporomandibular joint disorder, unspecified side: Secondary | ICD-10-CM | POA: Diagnosis not present

## 2020-05-19 DIAGNOSIS — G44209 Tension-type headache, unspecified, not intractable: Secondary | ICD-10-CM | POA: Diagnosis not present

## 2020-05-19 MED ORDER — TIZANIDINE HCL 4 MG PO TABS
4.0000 mg | ORAL_TABLET | Freq: Four times a day (QID) | ORAL | 0 refills | Status: DC | PRN
Start: 2020-05-19 — End: 2020-06-06

## 2020-05-19 NOTE — Progress Notes (Signed)
Subjective:    Patient ID: Erin Wong, female    DOB: [birth year], 69 y.o.   MRN: [id number]  Patient presents today with several complaints.  First she reports a 3-week history of headache.  The headache is all over her scalp.  It is not localized to one area.  There is no exacerbating or alleviating factor.  Her scalp is tender to the touch.  She denies any vision changes.  She denies any neurologic deficits.  She denies any falls or head trauma.  She denies any photophobia or phonophobia.  She does report episodes of being lightheaded and dizzy.  However her blood pressure is extremely low.  Her systolic blood pressure is usually between 90 and 100.  When her blood pressure is low she feels dizzy.  She also complains of bilateral ear pain.  However on examination both eardrums look completely normal.  She admits to grinding her teeth at night.  She is under a lot of stress which could potentially be causing tension headaches and contributing to TMJ. Past Medical History:  Diagnosis Date  . Allergy   . Arthritis   . Asthma   . Atrial fibrillation ([place])   . Benign essential HTN   . CHF (congestive heart failure) ([place])   . CKD (chronic kidney disease) stage 3, GFR 30-59 ml/min   . HLD (hyperlipidemia)    Past Surgical History:  Procedure Laterality Date  . ABDOMINAL HYSTERECTOMY  10/04/1995   Hysterectomy and Bilateral oophorectomy  . CARDIOVERSION N/A 01/17/2020   Procedure: CARDIOVERSION;  Surgeon: [name], MD;  Location: [facility] ENDOSCOPY;  Service: Cardiovascular;  Laterality: N/A;  . COLON SURGERY  09/2009   hole in colon  . FRACTURE SURGERY Right 08/02/2011   Right Leg--Plates & Screws  . 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 TEE WITHOUT CARDIOVERSION N/A 01/17/2020   Procedure: TRANSESOPHAGEAL ECHOCARDIOGRAM (TEE);  Surgeon: [name], MD;  Location: [facility] END[facility]COPY;  Service:  Cardiovascular;  Laterality: N/A;   Current Outpatient Medications on File Prior to Visit  Medication Sig Dispense Refill  . acetaminophen (TYLENOL) 650 MG CR tablet Take 2 tablets (1,300 mg total) by mouth 2 (two) times daily as needed for pain. 120 tablet 3  . albuterol (PROVENTIL HFA;VENTOLIN HFA) 108 (90 Base) MCG/ACT inhaler Inhale 1 puff into the lungs every 6 (six) hours as needed for wheezing or shortness of breath. 18 g 3  . amiodarone (PACERONE) 200 MG tablet Take 1 tablet (200 mg total) by mouth daily. 90 tablet 3  . atorvastatin (LIPITOR) 40 MG tablet TAKE 1 TABLET BY MOUTH EVERY DAY 90 tablet 1  . benzonatate (TESSALON) 100 MG capsule Take 1 capsule (100 mg total) by mouth 3 (three) times daily as needed for cough. 20 capsule 0  . calcium gluconate 500 MG tablet Take 500 mg by mouth daily.     . carvedilol (COREG) 25 MG tablet Take 25 mg by mouth 2 (two) times daily with a meal.    . cholecalciferol (VITAMIN D) 1000 units tablet Take 1 tablet (1,000 Units total) by mouth daily. 90 tablet 1  . ELIQUIS 5 MG TABS tablet TAKE 1 TABLET BY MOUTH TWICE A DAY 60 tablet 5  . fluticasone (FLONASE) 50 MCG/ACT nasal spray Place 2 sprays into both nostrils daily. 16 g 6  . Fluticasone-Salmeterol (ADVAIR DISKUS) 100-50 MCG/DOSE AEPB INHALE 1 PUFF  INTO THE LUNGS TWICE A DAY (Patient taking differently: Inhale 1 puff into the lungs in the morning and at bedtime. ) 60 each 5  . furosemide (LASIX) 40 MG tablet Take 1 tablet (40 mg total) by mouth daily. 90 tablet 3  . omeprazole (PRILOSEC) 20 MG capsule TAKE 1 CAPSULE BY MOUTH EVERY DAY (Patient taking differently: Take 20 mg by mouth daily. ) 90 capsule 3  . polyethylene glycol (MIRALAX / GLYCOLAX) 17 g packet Take 17 g by mouth daily.    . sacubitril-valsartan (ENTRESTO) 24-26 MG Take 1 tablet by mouth 2 (two) times daily. 60 tablet 1  . traMADol (ULTRAM) 50 MG tablet TAKE 2 TABLETS BY MOUTH 3 TIMES A DAY AS NEEDED FOR PAIN (Patient taking  differently: Take 50 mg by mouth 3 (three) times daily as needed for moderate pain. ) 180 tablet 2   No current facility-administered medications on file prior to visit.   No Known Allergies Social History   Socioeconomic History  . Marital status: Married    Spouse name: Not on file  . Number of children: Not on file  . Years of education: Not on file  . Highest education level: Not on file  Occupational History  . Not on file  Tobacco Use  . Smoking status: Former Smoker    Quit date: 01/10/1991    Years since quitting: 29.3  . Smokeless tobacco: Never Used  Vaping Use  . Vaping Use: Never used  Substance and Sexual Activity  . Alcohol use: No  . Drug use: No  . Sexual activity: Never  Other Topics Concern  . Not on file  Social History Narrative  . Not on file   Social Determinants of Health   Financial Resource Strain:   . Difficulty of Paying Living Expenses: Not on file  Food Insecurity:   . Worried About Charity fundraiser in the Last Year: Not on file  . Ran Out of Food in the Last Year: Not on file  Transportation Needs:   . Lack of Transportation (Medical): Not on file  . Lack of Transportation (Non-Medical): Not on file  Physical Activity:   . Days of Exercise per Week: Not on file  . Minutes of Exercise per Session: Not on file  Stress:   . Feeling of Stress : Not on file  Social Connections:   . Frequency of Communication with Friends and Family: Not on file  . Frequency of Social Gatherings with Friends and Family: Not on file  . Attends Religious Services: Not on file  . Active Member of Clubs or Organizations: Not on file  . Attends Archivist Meetings: Not on file  . Marital Status: Not on file  Intimate Partner Violence:   . Fear of Current or Ex-Partner: Not on file  . Emotionally Abused: Not on file  . Physically Abused: Not on file  . Sexually Abused: Not on file     Review of Systems  All other systems reviewed and are  negative.      Objective:   Physical Exam Vitals reviewed.  Constitutional:      Appearance: Normal appearance. She is well-developed and normal weight.  HENT:     Right Ear: Tympanic membrane and ear canal normal.     Left Ear: Tympanic membrane and ear canal normal.     Nose: Nose normal. No congestion or rhinorrhea.  Eyes:     Extraocular Movements: Extraocular movements intact.  Conjunctiva/sclera: Conjunctivae normal.     Pupils: Pupils are equal, round, and reactive to light.  Cardiovascular:     Rate and Rhythm: Normal rate. Rhythm irregular.     Heart sounds: Normal heart sounds. No murmur heard.  No friction rub. No gallop.   Pulmonary:     Effort: Pulmonary effort is normal. No respiratory distress.     Breath sounds: Normal breath sounds. No wheezing or rales.  Abdominal:     General: Bowel sounds are normal. There is no distension.     Palpations: Abdomen is soft. There is no mass.     Tenderness: There is no abdominal tenderness. There is no guarding or rebound.  Musculoskeletal:        General: Deformity present.     Right knee: Swelling present. Decreased range of motion. Tenderness present over the medial joint line and lateral joint line.     Right lower leg: No edema.     Left lower leg: No edema.  Neurological:     General: No focal deficit present.     Mental Status: She is alert and oriented to person, place, and time. Mental status is at baseline.     Cranial Nerves: No cranial nerve deficit.     Sensory: No sensory deficit.     Motor: No weakness.     Coordination: Coordination normal.     Gait: Gait normal.           Assessment & Plan:  Other specified hypotension - Plan: BASIC METABOLIC PANEL WITH GFR, CBC with Differential/Platelet  Tension headache  TMJ (temporomandibular joint syndrome)  I believe the bilateral ear pain is due to TMJ from grinding her teeth.  I want her to take tizanidine 4 mg prior to bedtime to try to relax the  muscles to prevent the bruxism that is occurring at night.  I believe the headaches are likely tension headaches.  She can take tizanidine 4 mg every 6 hours as needed for the headaches.  Recheck next week to see if the headaches have improved.  If not, would recommend imaging of the brain however I have a low index of suspicion for anything serious such as a space-occupying lesion or aneurysm.  I believe the dizziness and nausea is likely due to hypotension so I recommended that she temporarily hold her Entresto and then recheck her blood pressure next week to see if she is feeling better.  We may need to try reduced dose if that is in fact the case.  Check CBC to monitor for anemia and check BMP to monitor for prerenal azotemia.  Recheck next week

## 2020-05-20 LAB — CBC WITH DIFFERENTIAL/PLATELET
Absolute Monocytes: 1397 cells/uL — ABNORMAL HIGH (ref 200–950)
Basophils Absolute: 49 cells/uL (ref 0–200)
Basophils Relative: 0.5 %
Eosinophils Absolute: 417 cells/uL (ref 15–500)
Eosinophils Relative: 4.3 %
HCT: 36.3 % (ref 35.0–45.0)
Hemoglobin: 12.3 g/dL (ref 11.7–15.5)
Lymphs Abs: 2464 cells/uL (ref 850–3900)
MCH: 32.6 pg (ref 27.0–33.0)
MCHC: 33.9 g/dL (ref 32.0–36.0)
MCV: 96.3 fL (ref 80.0–100.0)
MPV: 11 fL (ref 7.5–12.5)
Monocytes Relative: 14.4 %
Neutro Abs: 5374 cells/uL (ref 1500–7800)
Neutrophils Relative %: 55.4 %
Platelets: 465 10*3/uL — ABNORMAL HIGH (ref 140–400)
RBC: 3.77 10*6/uL — ABNORMAL LOW (ref 3.80–5.10)
RDW: 12.4 % (ref 11.0–15.0)
Total Lymphocyte: 25.4 %
WBC: 9.7 10*3/uL (ref 3.8–10.8)

## 2020-05-20 LAB — BASIC METABOLIC PANEL WITH GFR
BUN: 13 mg/dL (ref 7–25)
CO2: 26 mmol/L (ref 20–32)
Calcium: 8.7 mg/dL (ref 8.6–10.4)
Chloride: 97 mmol/L — ABNORMAL LOW (ref 98–110)
Creat: 0.92 mg/dL (ref 0.60–0.93)
GFR, Est African American: 73 mL/min/{1.73_m2} (ref >=60)
GFR, Est Non African American: 63 mL/min/{1.73_m2} (ref >=60)
Glucose, Bld: 86 mg/dL (ref 65–99)
Potassium: 4.3 mmol/L (ref 3.5–5.3)
Sodium: 134 mmol/L — ABNORMAL LOW (ref 135–146)

## 2020-05-23 ENCOUNTER — Other Ambulatory Visit: Payer: Self-pay

## 2020-05-23 ENCOUNTER — Ambulatory Visit (INDEPENDENT_AMBULATORY_CARE_PROVIDER_SITE_OTHER): Payer: Medicare Other | Admitting: Nurse Practitioner

## 2020-05-23 ENCOUNTER — Encounter: Payer: Self-pay | Admitting: Nurse Practitioner

## 2020-05-23 VITALS — BP 90/50 | HR 46 | Ht 64.0 in | Wt 143.2 lb

## 2020-05-23 DIAGNOSIS — E785 Hyperlipidemia, unspecified: Secondary | ICD-10-CM

## 2020-05-23 DIAGNOSIS — Z79899 Other long term (current) drug therapy: Secondary | ICD-10-CM

## 2020-05-23 DIAGNOSIS — I5022 Chronic systolic (congestive) heart failure: Secondary | ICD-10-CM | POA: Diagnosis not present

## 2020-05-23 DIAGNOSIS — I480 Paroxysmal atrial fibrillation: Secondary | ICD-10-CM | POA: Diagnosis not present

## 2020-05-23 MED ORDER — APIXABAN 5 MG PO TABS
5.0000 mg | ORAL_TABLET | Freq: Two times a day (BID) | ORAL | 11 refills | Status: DC
Start: 2020-05-23 — End: 2020-08-03

## 2020-05-23 NOTE — Telephone Encounter (Signed)
Eliquis 5mg  refill request received. Patient is 69 years old, weight-65kg, Crea-0.92 on 05/19/2020, Diagnosis-Afib, and last seen by [name] on today, 05/23/2020. Dose is appropriate based on dosing criteria. Will send in refill to requested pharmacy.

## 2020-05-23 NOTE — Telephone Encounter (Signed)
Medication needed to be resent to a different pharmacy, [facility] Department. Please resend

## 2020-05-23 NOTE — Patient Instructions (Addendum)
After Visit Summary:  We will be checking the following labs today - NONE   Medication Instructions:    Continue with your current medicines. BUT  I am cutting the Coreg back - do not take any more today.  Tomorrow start just 1/2 a tablet of the Coreg twice a day - this will be 12.5 mg    If you need a refill on your cardiac medications before your next appointment, please call your pharmacy.     Testing/Procedures To Be Arranged:  Limited echo  Follow-Up:   See me after echo is completed.     At [facility], you and your health needs are our priority.  As part of our continuing mission to provide you with exceptional heart care, we have created designated Provider Care Teams.  These Care Teams include your primary Cardiologist (physician) and Advanced Practice Providers (APPs -  Physician Assistants and Nurse Practitioners) who all work together to provide you with the care you need, when you need it.  Special Instructions:  . Stay safe, wash your hands for at least 20 seconds and wear a mask when needed.  . It was good to talk with you both today.  Madaline Brilliant to have a little salt today   Call the [place] office at [phone] if you have any questions, problems or concerns.

## 2020-05-24 ENCOUNTER — Ambulatory Visit (HOSPITAL_COMMUNITY): Payer: Medicare Other | Attending: Cardiology

## 2020-05-24 DIAGNOSIS — I5022 Chronic systolic (congestive) heart failure: Secondary | ICD-10-CM | POA: Diagnosis not present

## 2020-05-24 LAB — ECHOCARDIOGRAM LIMITED
Area-P 1/2: 2.05 cm2
S' Lateral: 3.1 cm

## 2020-05-26 ENCOUNTER — Other Ambulatory Visit: Payer: Self-pay

## 2020-05-26 ENCOUNTER — Ambulatory Visit (INDEPENDENT_AMBULATORY_CARE_PROVIDER_SITE_OTHER): Payer: Medicare Other | Admitting: Family Medicine

## 2020-05-26 VITALS — BP 94/50 | HR 51 | Temp 97.5°F | Ht 64.0 in | Wt 143.0 lb

## 2020-05-26 DIAGNOSIS — G44209 Tension-type headache, unspecified, not intractable: Secondary | ICD-10-CM

## 2020-05-26 DIAGNOSIS — Z23 Encounter for immunization: Secondary | ICD-10-CM | POA: Diagnosis not present

## 2020-05-26 DIAGNOSIS — I5022 Chronic systolic (congestive) heart failure: Secondary | ICD-10-CM | POA: Diagnosis not present

## 2020-05-26 NOTE — Progress Notes (Signed)
Subjective:    Patient ID: Erin Wong, female    DOB: 08-May-1951, 69 y.o.   MRN: [id number] 05/19/20 Patient presents today with several complaints.  First she reports a 3-week history of headache.  The headache is all over her scalp.  It is not localized to one area.  There is no exacerbating or alleviating factor.  Her scalp is tender to the touch.  She denies any vision changes.  She denies any neurologic deficits.  She denies any falls or head trauma.  She denies any photophobia or phonophobia.  She does report episodes of being lightheaded and dizzy.  However her blood pressure is extremely low.  Her systolic blood pressure is usually between 90 and 100.  When her blood pressure is low she feels dizzy.  She also complains of bilateral ear pain.  However on examination both eardrums look completely normal.  She admits to grinding her teeth at night.  She is under a lot of stress which could potentially be causing tension headaches and contributing to TMJ.  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  I want her to take tizanidine 4 mg prior to bedtime to try to relax the muscles to prevent the bruxism that is occurring at night.  I believe the headaches are likely tension headaches.  She can take tizanidine 4 mg every 6 hours as needed for the headaches.  Recheck next week to see if the headaches have improved.  If not, would recommend imaging of the brain however I have a low index of suspicion for anything serious such as a space-occupying lesion or aneurysm.  I believe the dizziness and nausea is likely due to hypotension so I recommended that she temporarily hold her Entresto and then recheck her blood pressure next week to see if she is feeling better.  We may need to try reduced dose if that is in fact the case.  Check CBC to monitor for anemia and check BMP to monitor for prerenal azotemia.  Recheck next week  05/26/20 Patient states that  the headaches have improved.  She has not had any further headaches.  She does continue to have some mild pain in her right ear.  She states that she feels much better compared to the last time I saw her.  Since I saw her last, she also saw her cardiologist who reduced her dose of Coreg to 1/2 tablet twice a day.  Despite that she is still bradycardic today and also hypotensive at 94/50.  However she feels better and she just reduce the dose of carvedilol 2 days ago.  She has an appointment to follow-up with her cardiologist in 2 weeks.  She had an echocardiogram performed recently that showed her ejection fraction had improved to 65%! Past Medical History:  Diagnosis Date  . Allergy   . Arthritis   . Asthma   . Atrial fibrillation ([place])   . Benign essential HTN   . CHF (congestive heart failure) ([place])   . CKD (chronic kidney disease) stage 3, GFR 30-59 ml/min   . HLD (hyperlipidemia)    Past Surgical History:  Procedure Laterality Date  . ABDOMINAL HYSTERECTOMY  10/04/1995   Hysterectomy and Bilateral oophorectomy  . CARDIOVERSION N/A 01/17/2020   Procedure: CARDIOVERSION;  Surgeon: [name], MD;  Location: [facility] ENDOSCOPY;  Service: Cardiovascular;  Laterality: N/A;  . COLON SURGERY  09/2009   hole in  colon  . FRACTURE SURGERY Right 08/02/2011   Right Leg--Plates & Screws  . 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 TEE WITHOUT CARDIOVERSION N/A 01/17/2020   Procedure: TRANSESOPHAGEAL ECHOCARDIOGRAM (TEE);  Surgeon: [name], MD;  Location: [facility] ENDOSCOPY;  Service: Cardiovascular;  Laterality: N/A;   Current Outpatient Medications on File Prior to Visit  Medication Sig Dispense Refill  . albuterol (PROVENTIL HFA;VENTOLIN HFA) 108 (90 Base) MCG/ACT inhaler Inhale 1 puff into the lungs every 6 (six) hours as needed for wheezing or shortness of breath. 18 g 3  . amiodarone (PACERONE) 200 MG  tablet Take 1 tablet (200 mg total) by mouth daily. 90 tablet 3  . apixaban (ELIQUIS) 5 MG TABS tablet Take 1 tablet (5 mg total) by mouth 2 (two) times daily. 60 tablet 11  . atorvastatin (LIPITOR) 40 MG tablet TAKE 1 TABLET BY MOUTH EVERY DAY 90 tablet 1  . calcium gluconate 500 MG tablet Take 500 mg by mouth daily.     . carvedilol (COREG) 25 MG tablet Take 12.5 mg by mouth 2 (two) times daily with a meal.    . cholecalciferol (VITAMIN D) 1000 units tablet Take 1 tablet (1,000 Units total) by mouth daily. 90 tablet 1  . fluticasone (FLONASE) 50 MCG/ACT nasal spray Place 2 sprays into both nostrils daily. 16 g 6  . Fluticasone-Salmeterol (ADVAIR DISKUS) 100-50 MCG/DOSE AEPB INHALE 1 PUFF INTO THE LUNGS TWICE A DAY 60 each 5  . furosemide (LASIX) 40 MG tablet Take 1 tablet (40 mg total) by mouth daily. 90 tablet 3  . omeprazole (PRILOSEC) 20 MG capsule TAKE 1 CAPSULE BY MOUTH EVERY DAY 90 capsule 3  . polyethylene glycol (MIRALAX / GLYCOLAX) 17 g packet Take 17 g by mouth daily.    Marland Kitchen tiZANidine (ZANAFLEX) 4 MG tablet Take 1 tablet (4 mg total) by mouth every 6 (six) hours as needed (headaches and ear pain). 30 tablet 0  . traMADol (ULTRAM) 50 MG tablet TAKE 2 TABLETS BY MOUTH 3 TIMES A DAY AS NEEDED FOR PAIN 180 tablet 2   No current facility-administered medications on file prior to visit.   No Known Allergies Social History   Socioeconomic History  . Marital status: Married    Spouse name: Not on file  . Number of children: Not on file  . Years of education: Not on file  . Highest education level: Not on file  Occupational History  . Not on file  Tobacco Use  . Smoking status: Former Smoker    Quit date: 01/10/1991    Years since quitting: 29.3  . Smokeless tobacco: Never Used  Vaping Use  . Vaping Use: Never used  Substance and Sexual Activity  . Alcohol use: No  . Drug use: No  . Sexual activity: Never  Other Topics Concern  . Not on file  Social History Narrative  . Not  on file   Social Determinants of Health   Financial Resource Strain:   . Difficulty of Paying Living Expenses: Not on file  Food Insecurity:   . Worried About Charity fundraiser in the Last Year: Not on file  . Ran Out of Food in the Last Year: Not on file  Transportation Needs:   . Lack of Transportation (Medical): Not on file  . Lack of Transportation (Non-Medical): Not on file  Physical Activity:   . Days  of Exercise per Week: Not on file  . Minutes of Exercise per Session: Not on file  Stress:   . Feeling of Stress : Not on file  Social Connections:   . Frequency of Communication with Friends and Family: Not on file  . Frequency of Social Gatherings with Friends and Family: Not on file  . Attends Religious Services: Not on file  . Active Member of Clubs or Organizations: Not on file  . Attends Archivist Meetings: Not on file  . Marital Status: Not on file  Intimate Partner Violence:   . Fear of Current or Ex-Partner: Not on file  . Emotionally Abused: Not on file  . Physically Abused: Not on file  . Sexually Abused: Not on file     Review of Systems  All other systems reviewed and are negative.      Objective:   Physical Exam Vitals reviewed.  Constitutional:      Appearance: Normal appearance. She is well-developed and normal weight.  HENT:     Right Ear: Tympanic membrane and ear canal normal.     Left Ear: Tympanic membrane and ear canal normal.     Nose: Nose normal. No congestion or rhinorrhea.  Eyes:     Extraocular Movements: Extraocular movements intact.     Conjunctiva/sclera: Conjunctivae normal.     Pupils: Pupils are equal, round, and reactive to light.  Cardiovascular:     Rate and Rhythm: Normal rate. Rhythm irregular.     Heart sounds: Normal heart sounds. No murmur heard.  No friction rub. No gallop.   Pulmonary:     Effort: Pulmonary effort is normal. No respiratory distress.     Breath sounds: Normal breath sounds. No wheezing  or rales.  Abdominal:     General: Bowel sounds are normal. There is no distension.     Palpations: Abdomen is soft. There is no mass.     Tenderness: There is no abdominal tenderness. There is no guarding or rebound.  Musculoskeletal:        General: Deformity present.     Right knee: Swelling present. Decreased range of motion. Tenderness present over the medial joint line and lateral joint line.     Right lower leg: No edema.     Left lower leg: No edema.  Neurological:     General: No focal deficit present.     Mental Status: She is alert and oriented to person, place, and time. Mental status is at baseline.     Cranial Nerves: No cranial nerve deficit.     Sensory: No sensory deficit.     Motor: No weakness.     Coordination: Coordination normal.     Gait: Gait normal.           Assessment & Plan:  Need for immunization against influenza - Plan: Flu Vaccine QUAD High Dose(Fluad)  Tension headache  Chronic systolic congestive heart failure (HCC)  Tension headache seems to have improved with tizanidine.  I recommended that she stop the medication and use it sparingly only if needed.  Patient continues to have ear pain which I suspect is due to TMJ.  I recommended that she use a mouthguard at night to prevent bruxism.  Patient feels better now that her blood pressure is slightly higher although she is still hypotensive.  I explained to the patient is a trade off.  The medications (Coreg and Entresto) have improved her ejection fraction dramatically over the last 4 months from  30% to 65%.  Unfortunately they are dropping her blood pressure and causing her to feel tired.  Therefore I will defer to her cardiologist at their follow-up appointment if they want to continue the current dose of carvedilol.  Patient received her flu shot today.

## 2020-05-29 NOTE — Progress Notes (Signed)
CARDIOLOGY OFFICE NOTE  Date:  06/06/2020    Erin Wong Date of Birth: 1951-02-09 Medical Record #[id number]  PCP:  Erin Frizzle, MD  Cardiologist:  Erin Wong    Chief Complaint  Patient presents with  . Follow-up    Seen for Dr. Johnsie Wong    History of Present Illness: Erin Wong is a 69 y.o. female who presents today for a 2 week check. Seen for Dr. Johnsie Wong.   She has a history of persistentAF s/p failed DCCV on 01/17/2020 - placed on Amiodarone and Eliquis with plans for repeat cardioversion - however was in NSR. 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   Plan was to have repeat echo after 3 months of therapy - if no improvement in EF - would need to consider ischemic work up.   Seen as a work in back in August by Erin Barrios, PA - had called in with low heart rates - amiodarone and Coreg were cut back and she was asked to come in for EKG. On exam she was doing ok - had increased her medicines back up - she thought her dizziness was from too much sugar. Had had some volume overload with low sats noted that had resolved.   Holter was done - less than 1% AF burden - average HR was 51. Her regimen was continued.    I then saw her about 2 weeks ago - feeling poorly. Quite hypotensive. Echo was updated - her EF has recovered very nicely. We have cut several medicines back and PCP had already stopped her Entresto due to BP of 80 systolic.   Comes in today. Here with her husband today. She is feeling better. But she remains "tired". She does look better today. BP has improved. Not checking at home. No chest pain. Breathing is ok. Not dizzy or lightheaded. Her headaches have stopped. She got our message about her echo - EF is now normalized. She is just "tired". She remains off alcohol.   Past Medical History:  Diagnosis Date  .  Allergy   . Arthritis   . Asthma   . Atrial fibrillation ([place])   . Benign essential HTN   . CHF (congestive heart failure) ([place])   . CKD (chronic kidney disease) stage 3, GFR 30-59 ml/min (HCC)   . HLD (hyperlipidemia)     Past Surgical History:  Procedure Laterality Date  . ABDOMINAL HYSTERECTOMY  10/04/1995   Hysterectomy and Bilateral oophorectomy  . CARDIOVERSION N/A 01/17/2020   Procedure: CARDIOVERSION;  Surgeon: Erin Hector, MD;  Location[facility] ENDOSCOPY;  Service: Cardiovascular;  Laterality: N/A;  . COLON SURGERY  09/2009   hole in colon  . FRACTURE SURGERY Right 08/02/2011   Right Leg--Plates & Screws  . 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 TEE WITHOUT CARDIOVERSION N/A 01/17/2020   Procedure: TRANSESOPHAGEAL ECHOCARDIOGRAM (TEE);  Surgeon: Erin Hector, MD;  Location: [facility] ENDOSCOPY;  Service: Cardiovascular;  Laterality: N/A;     Medications: Current Meds  Medication Sig  . albuterol (PROVENTIL HFA;VENTOLIN HFA) 108 (90 Base) MCG/ACT inhaler Inhale 1 puff into the lungs every 6 (six) hours as needed for wheezing or shortness of breath.  Marland Kitchen amiodarone (PACERONE) 200 MG tablet Take 1 tablet (200 mg  total) by mouth daily.  Marland Kitchen apixaban (ELIQUIS) 5 MG TABS tablet Take 1 tablet (5 mg total) by mouth 2 (two) times daily.  Marland Kitchen atorvastatin (LIPITOR) 40 MG tablet TAKE 1 TABLET BY MOUTH EVERY DAY  . calcium gluconate 500 MG tablet Take 500 mg by mouth daily.   . cholecalciferol (VITAMIN D) 1000 units tablet Take 1 tablet (1,000 Units total) by mouth daily.  . fluticasone (FLONASE) 50 MCG/ACT nasal spray Place 2 sprays into both nostrils daily.  . Fluticasone-Salmeterol (ADVAIR DISKUS) 100-50 MCG/DOSE AEPB INHALE 1 PUFF INTO THE LUNGS TWICE A DAY  . furosemide (LASIX) 40 MG tablet Take 1 tablet (40 mg total) by mouth daily.  Marland Kitchen omeprazole (PRILOSEC) 20 MG capsule TAKE 1 CAPSULE BY MOUTH  EVERY DAY  . polyethylene glycol (MIRALAX / GLYCOLAX) 17 g packet Take 17 g by mouth daily.  . traMADol (ULTRAM) 50 MG tablet TAKE 2 TABLETS BY MOUTH 3 TIMES A DAY AS NEEDED FOR PAIN  . [DISCONTINUED] carvedilol (COREG) 25 MG tablet Take 12.5 mg by mouth 2 (two) times daily with a meal.     Allergies: No Known Allergies  Social History: The patient  reports that she quit smoking about 29 years ago. She has never used smokeless tobacco. She reports that she does not drink alcohol and does not use drugs.   Family History: The patient's family history includes Alcohol abuse in her brother and father; Arthritis in her mother; Asthma in her maternal aunt; COPD in her brother; Cancer in her mother; Diabetes in her maternal aunt; Miscarriages / Stillbirths in her mother.   Review of Systems: Please see the history of present illness.   All other systems are reviewed and negative.   Physical Exam: VS:  BP (!) 144/74   Pulse (!) 50   Ht 5\' 4"  (1.626 m)   Wt 143 lb 12.8 oz (65.2 kg)   SpO2 97%   BMI 24.68 kg/m  .  BMI Body mass index is 24.68 kg/m.  Wt Readings from Last 3 Encounters:  06/06/20 143 lb 12.8 oz (65.2 kg)  05/26/20 143 lb (64.9 kg)  05/23/20 143 lb 3.2 oz (65 kg)   BP is 130/70 by me.   General: Pleasant. Alert and in no acute distress. She looks much better today.   Cardiac: Regular rate and rhythm. No murmurs, rubs, or gallops. No edema.  Respiratory:  Lungs are clear to auscultation bilaterally with normal work of breathing.  GI: Soft and nontender.  MS: No deformity or atrophy. Gait and ROM intact.  Skin: Warm and dry. Color is normal.  Neuro:  Strength and sensation are intact and no gross focal deficits noted.  Psych: Alert, appropriate and with normal affect.   LABORATORY DATA:  EKG:  EKG is not ordered today.    Lab Results  Component Value Date   WBC 9.7 05/19/2020   HGB 12.3 05/19/2020   HCT 36.3 05/19/2020   PLT 465 (H) 05/19/2020   GLUCOSE 86  05/19/2020   CHOL 139 01/14/2020   TRIG 58 01/14/2020   HDL 70 01/14/2020   LDLCALC 57 01/14/2020   ALT 32 (H) 02/22/2020   AST 21 02/22/2020   NA 134 (L) 05/19/2020   K 4.3 05/19/2020   CL 97 (L) 05/19/2020   CREATININE 0.92 05/19/2020   BUN 13 05/19/2020   CO2 26 05/19/2020   TSH 1.525 01/13/2020   INR 1.4 (H) 02/13/2020     BNP (last 3 results) Recent Labs  01/13/20 1418 02/12/20 0030  BNP 983.1* 602.0*    ProBNP (last 3 results) No results for input(s): PROBNP in the last 8760 hours.   Other Studies Reviewed Today:  LIMITED ECHO IMPRESSIONS 05/2020  1. Left ventricular ejection fraction, by estimation, is 60 to 65%. The  left ventricle has normal function. The left ventricle has no regional  wall motion abnormalities. Left ventricular diastolic parameters are  consistent with Grade II diastolic  dysfunction (pseudonormalization).  2. Right ventricular systolic function is normal. The right ventricular  size is normal. There is normal pulmonary artery systolic pressure. The  estimated right ventricular systolic pressure is 29.5 mmHg.  3. Left atrial size was mild to moderately dilated.  4. The mitral valve is normal in structure. Mild mitral valve  regurgitation. No evidence of mitral stenosis.  5. The aortic valve is tricuspid. Aortic valve regurgitation is trivial.  No aortic stenosis is present.  6. The inferior vena cava is normal in size with greater than 50%  respiratory variability, suggesting right atrial pressure of 3 mmHg.   Comparison(s): 01/14/20 EF 30-35%. PA pressure 100mmHg.   Monitor Study Highlights 04/2020  SR/SB average HR 51 bpm < 1% burden PAF PAC's <1% total beats     Echocardiogram 01/14/2020: Impressions: 1. Left ventricular ejection fraction, by estimation, is 30 to 35%. The  left ventricle has moderately decreased function. The left ventricle  demonstrates global hypokinesis. The left ventricular internal cavity  size  was mildly dilated. Left ventricular  diastolic parameters are indeterminate. There is akinesis of the left  ventricular, mid-apical inferior wall, anterior wall, inferolateral wall,  anterolateral wall and lateral wall. There is akinesis of the left  ventricular, apical septal wall and apical  segment.  2. Right ventricular systolic function is mildly reduced. The right  ventricular size is mildly enlarged. There is moderately elevated  pulmonary artery systolic pressure. The estimated right ventricular  systolic pressure is 62.1 mmHg.  3. Left atrial size was severely dilated.  4. The mitral valve is normal in structure. Mild to moderate mitral valve  regurgitation. No evidence of mitral stenosis.  5. Tricuspid valve regurgitation is mild to moderate.  6. The aortic valve is normal in structure. Aortic valve regurgitation is  trivial. No aortic stenosis is present.  7. The inferior vena cava is dilated in size with <50% respiratory  variability, suggesting right atrial pressure of 15 mmHg.      ASSESSMENT & PLAN:   1. Persistent AF - remains in sinus but remains bradycardic - HR is 50 today - I think the Coreg is still the reason why she is tired/fatigued - will cut this back further today.   2. High risk medicine - has had recent labs. Remains on low dose Amiodarone.   3. Chronic anticoagulation with Eliquis - no problems noted.   4. Chronic systolic HF - most likely this was tachycardia mediated - follow up limited echo now with normalized EF to 60 to 65%. She remains off Entresto - I think we may be able to restart (?half dose) on return if BP starts creeping up.   5. HTN - recheck by me is fine. BP is greatly improved from last visit. She is going to monitor and let us know if consistently > 140/80.   6. HLD - on statin  7. CKD - recent lab ok.   Current medicines are reviewed with the patient today.  The patient does not have concerns regarding  medicines other than  what has been noted above.  The following changes have been made:  See above.  Labs/ tests ordered today include:   No orders of the defined types were placed in this encounter.    Disposition:   FU with me in about 3 weeks with EKG. She will monitor her BP. Coreg is cut back further today.    Patient is agreeable to this plan and will call if any problems develop in the interim.   SignedTruitt Merle, NP  06/06/2020 3:04 PM  [facility] [address] [place], [place]  [address] Phone: [phone] Fax: [phone]

## 2020-06-02 ENCOUNTER — Encounter: Payer: Self-pay | Admitting: Family Medicine

## 2020-06-03 ENCOUNTER — Other Ambulatory Visit: Payer: Self-pay | Admitting: Family Medicine

## 2020-06-06 ENCOUNTER — Encounter: Payer: Self-pay | Admitting: Nurse Practitioner

## 2020-06-06 ENCOUNTER — Ambulatory Visit (INDEPENDENT_AMBULATORY_CARE_PROVIDER_SITE_OTHER): Payer: Medicare Other | Admitting: Nurse Practitioner

## 2020-06-06 ENCOUNTER — Other Ambulatory Visit: Payer: Self-pay

## 2020-06-06 VITALS — BP 144/74 | HR 50 | Ht 64.0 in | Wt 143.8 lb

## 2020-06-06 DIAGNOSIS — I480 Paroxysmal atrial fibrillation: Secondary | ICD-10-CM

## 2020-06-06 DIAGNOSIS — I5022 Chronic systolic (congestive) heart failure: Secondary | ICD-10-CM

## 2020-06-06 DIAGNOSIS — R001 Bradycardia, unspecified: Secondary | ICD-10-CM

## 2020-06-06 DIAGNOSIS — E785 Hyperlipidemia, unspecified: Secondary | ICD-10-CM | POA: Diagnosis not present

## 2020-06-06 DIAGNOSIS — Z79899 Other long term (current) drug therapy: Secondary | ICD-10-CM | POA: Diagnosis not present

## 2020-06-06 MED ORDER — CARVEDILOL 6.25 MG PO TABS: 6.2500 mg | ORAL_TABLET | Freq: Two times a day (BID) | ORAL | 3 refills | Status: DC

## 2020-06-06 NOTE — Patient Instructions (Addendum)
After Visit Summary:  We will be checking the following labs today - NONE   Medication Instructions:    Continue with your current medicines. BUT  I am going to cut the Coreg back further - this will now be 6.25 mg twice a day - this is at your pharmacy.    If you need a refill on your cardiac medications before your next appointment, please call your pharmacy.     Testing/Procedures To Be Arranged:  N/A  Follow-Up:   See me in about 3 weeks with an EKG     At [facility], you and your health needs are our priority.  As part of our continuing mission to provide you with exceptional heart care, we have created designated Provider Care Teams.  These Care Teams include your primary Cardiologist (physician) and Advanced Practice Providers (APPs -  Physician Assistants and Nurse Practitioners) who all work together to provide you with the care you need, when you need it.  Special Instructions:  . Stay safe, wash your hands for at least 20 seconds and wear a mask when needed.  . It was good to talk with you today.  . Start checking your BP for me - daily - keep a record - call or send [place] a message if consistently running > 140/80   Call the [place] office at [phone] if you have any questions, problems or concerns.

## 2020-06-09 ENCOUNTER — Other Ambulatory Visit: Payer: Self-pay | Admitting: Family Medicine

## 2020-06-12 ENCOUNTER — Other Ambulatory Visit: Payer: Self-pay | Admitting: Family Medicine

## 2020-06-12 MED ORDER — FLUTICASONE-SALMETEROL 100-50 MCG/DOSE IN AEPB: INHALATION_SPRAY | RESPIRATORY_TRACT | 1 refills | Status: DC

## 2020-06-12 NOTE — Telephone Encounter (Signed)
Prescription corrected and sent to pharmacy.   

## 2020-06-12 NOTE — Telephone Encounter (Signed)
Received fax stating missing/illegible information on RX  Advair 100-50 diskus [facility] Advair 60 ACTUAT fluticasone propionate 0.1 mg/ actuat/salmeterol 0.05 mg/actuat [place] Inhaler Qty: 180 Sig: Inhale 1 puff into the lungs twice a day

## 2020-06-19 NOTE — Progress Notes (Signed)
CARDIOLOGY OFFICE NOTE  Date:  06/27/2020    Erin Wong Date of Birth: 06/02/1951 Medical Record #[id number]  PCP:  [name], MD  Cardiologist:  Gillian Shields    Chief Complaint  Patient presents with  . Follow-up    Seen for Dr. Johnsie Cancel    History of Present Illness: Erin Wong is a 69 y.o. female who presents today for a 3 week check. Seen for Dr. Johnsie Cancel.   She has a history of persistentAFs/p failed DCCV on 01/17/2020 - placedon Amiodarone and Eliquiswith plans for repeat cardioversion- however was in NSR. 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  Plan was to have repeat echo after 3 months of therapy - if no improvement in EF - would need to consider ischemic work up.  Seen as a work in back in August by [name], PA - had called in with low heart rates - amiodarone and Coreg were cut back and she was asked to come in for EKG. On exam she was doing ok - had increased her medicines back up - she thought her dizziness was from too much sugar. Had had some volume overload with low sats noted that had resolved.   Holter was done - less than 1% AF burden - average HR was 51.Her regimen was continued.I then saw her last month - feeling very poorly - quite hypotensive - Echo was updated - EF has recovered - we had to cut multiple medicines back. She was better on return but still "tired" - HR just 50. I cut her Coreg back further.   Comes in today. Here with her husband today. She is doing well. Feeling better. More "better days now than bad days". BP and HR are fine. Hr has come up nicely. She feels better. Not as fatigued. No chest pain. Breathing is good. She is quite happy with how she is doing.   Past Medical History:  Diagnosis Date  . Allergy   . Arthritis   . Asthma   . Atrial fibrillation ([place])   .  Benign essential HTN   . CHF (congestive heart failure) ([place])   . CKD (chronic kidney disease) stage 3, GFR 30-59 ml/min (H[facility])   . HLD (hyperlipidemia)     Past Surgical History:  Procedure Laterality Date  . ABDOMINAL HYSTERECTOMY  10/04/1995   Hysterectomy and Bilateral oophorectomy  . CARDIOVERSION N/A 01/17/2020   Procedure: CARDIOVERSION;  Surgeon: [name], MD;  Location: [facility] ENDOSCOPY;  Service: Cardiovascular;  Laterality: N/A;  . COLON SURGERY  09/2009   hole in colon  . FRACTURE SURGERY Right 08/02/2011   Right Leg--Plates & Screws  . 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 TEE WITHOUT CARDIOVERSION N/A 01/17/2020   Procedure: TRANSESOPHAGEAL ECHOCARDIOGRAM (TEE);  Surgeon: [name], MD;  Location: [facility] ENDOSCOPY;  Service: Cardiovascular;  Laterality: N/A;     Medications: Current Meds  Medication Sig  . albuterol (PROVENTIL HFA;VENTOLIN HFA) 108 (90 Base) MCG/ACT inhaler Inhale 1 puff into the lungs every 6 (six) hours as needed for wheezing or shortness of breath.  Marland Kitchen amiodarone (PACERONE) 200 MG tablet Take 1 tablet (200 mg total) by mouth daily.  Marland Kitchen apixaban (ELIQUIS) 5 MG TABS tablet Take 1 tablet (5 mg  total) by mouth 2 (two) times daily.  Marland Kitchen atorvastatin (LIPITOR) 40 MG tablet TAKE 1 TABLET BY MOUTH EVERY DAY  . calcium gluconate 500 MG tablet Take 500 mg by mouth daily.   . carvedilol (COREG) 6.25 MG tablet Take 1 tablet (6.25 mg total) by mouth 2 (two) times daily.  . cholecalciferol (VITAMIN D) 1000 units tablet Take 1 tablet (1,000 Units total) by mouth daily.  . fluticasone (FLONASE) 50 MCG/ACT nasal spray Place 2 sprays into both nostrils daily.  . Fluticasone-Salmeterol (ADVAIR DISKUS) 100-50 MCG/DOSE AEPB INHALE 1 PUFF INTO THE LUNGS TWICE A DAY  . furosemide (LASIX) 40 MG tablet Take 1 tablet (40 mg total) by mouth daily.  Marland Kitchen omeprazole (PRILOSEC) 20 MG  capsule TAKE 1 CAPSULE BY MOUTH EVERY DAY  . polyethylene glycol (MIRALAX / GLYCOLAX) 17 g packet Take 17 g by mouth daily.  . traMADol (ULTRAM) 50 MG tablet TAKE 2 TABLETS BY MOUTH 3 TIMES A DAY AS NEEDED FOR PAIN     Allergies: No Known Allergies  Social History: The patient  reports that she quit smoking about 29 years ago. She has never used smokeless tobacco. She reports that she does not drink alcohol and does not use drugs.   Family History: The patient's family history includes Alcohol abuse in her brother and father; Arthritis in her mother; Asthma in her maternal aunt; COPD in her brother; Cancer in her mother; Diabetes in her maternal aunt; Miscarriages / Stillbirths in her mother.   Review of Systems: Please see the history of present illness.   All other systems are reviewed and negative.   Physical Exam: VS:  BP 138/72 (BP Location: Left Arm, Patient Position: Sitting, Cuff Size: Normal)   Pulse (!) 54   Ht 5\' 4"  (1.626 m)   Wt 143 lb (64.9 kg)   BMI 24.55 kg/m  .  BMI Body mass index is 24.55 kg/m.  Wt Readings from Last 3 Encounters:  06/27/20 143 lb (64.9 kg)  06/06/20 143 lb 12.8 oz (65.2 kg)  05/26/20 143 lb (64.9 kg)    General: Alert and in no acute distress. She looks good today.    Cardiac: Regular rate and rhythm. No murmurs, rubs, or gallops. No edema.  Respiratory:  Lungs are clear to auscultation bilaterally with normal work of breathing.  GI: Soft and nontender.  MS: No deformity or atrophy. Gait and ROM intact.  Skin: Warm and dry. Color is normal.  Neuro:  Strength and sensation are intact and no gross focal deficits noted.  Psych: Alert, appropriate and with normal affect.   LABORATORY DATA:  EKG:  EKG is ordered today.  Personally reviewed by me. This demonstrates sinus bradycardia - HR up to 54.  Lab Results  Component Value Date   WBC 9.7 05/19/2020   HGB 12.3 05/19/2020   HCT 36.3 05/19/2020   PLT 465 (H) 05/19/2020   GLUCOSE 86  05/19/2020   CHOL 139 01/14/2020   TRIG 58 01/14/2020   HDL 70 01/14/2020   LDLCALC 57 01/14/2020   ALT 32 (H) 02/22/2020   AST 21 02/22/2020   NA 134 (L) 05/19/2020   K 4.3 05/19/2020   CL 97 (L) 05/19/2020   CREATININE 0.92 05/19/2020   BUN 13 05/19/2020   CO2 26 05/19/2020   TSH 1.525 01/13/2020   INR 1.4 (H) 02/13/2020     BNP (last 3 results) Recent Labs    01/13/20 1418 02/12/20 0030  BNP 983.1* 602.0*  ProBNP (last 3 results) No results for input(s): PROBNP in the last 8760 hours.   Other Studies Reviewed Today:  LIMITED ECHO IMPRESSIONS 05/2020  1. Left ventricular ejection fraction, by estimation, is 60 to 65%. The  left ventricle has normal function. The left ventricle has no regional  wall motion abnormalities. Left ventricular diastolic parameters are  consistent with Grade II diastolic  dysfunction (pseudonormalization).  2. Right ventricular systolic function is normal. The right ventricular  size is normal. There is normal pulmonary artery systolic pressure. The  estimated right ventricular systolic pressure is 64.6 mmHg.  3. Left atrial size was mild to moderately dilated.  4. The mitral valve is normal in structure. Mild mitral valve  regurgitation. No evidence of mitral stenosis.  5. The aortic valve is tricuspid. Aortic valve regurgitation is trivial.  No aortic stenosis is present.  6. The inferior vena cava is normal in size with greater than 50%  respiratory variability, suggesting right atrial pressure of 3 mmHg.   Comparison(s): 01/14/20 EF 30-35%. PA pressure 75mmHg.   MonitorStudy Highlights8/2021  SR/SB average HR 51 bpm < 1% burden PAF PAC's <1% total beats    Echocardiogram 01/14/2020: Impressions: 1. Left ventricular ejection fraction, by estimation, is 30 to 35%. The  left ventricle has moderately decreased function. The left ventricle  demonstrates global hypokinesis. The left ventricular internal cavity  size  was mildly dilated. Left ventricular  diastolic parameters are indeterminate. There is akinesis of the left  ventricular, mid-apical inferior wall, anterior wall, inferolateral wall,  anterolateral wall and lateral wall. There is akinesis of the left  ventricular, apical septal wall and apical  segment.  2. Right ventricular systolic function is mildly reduced. The right  ventricular size is mildly enlarged. There is moderately elevated  pulmonary artery systolic pressure. The estimated right ventricular  systolic pressure is 80.3 mmHg.  3. Left atrial size was severely dilated.  4. The mitral valve is normal in structure. Mild to moderate mitral valve  regurgitation. No evidence of mitral stenosis.  5. Tricuspid valve regurgitation is mild to moderate.  6. The aortic valve is normal in structure. Aortic valve regurgitation is  trivial. No aortic stenosis is present.  7. The inferior vena cava is dilated in size with <50% respiratory  variability, suggesting right atrial pressure of 15 mmHg.      ASSESSMENT& PLAN:   1. Persistent AF - remains in sinus - HR has improved with reduction in her Coreg. She feels much better clinically. Will leave on her current regimen.   2. Chronic anticoagulation - on Eliquis - she did get her patient assistance for this. No problems noted.   3. Chronic systolic HF/NICM - felt to be tachycardia mediated - has had return of normal EF. BP and bradycardia led to reduction in her medicines. Will stay on this current regimen for now. If BP starts to creep up - would favor restart Entresto - probably at half dose.   4. High risk medicine - will need lab on return visit.   5. HTN - BP is fine on her current regimen - she feels better clinically. No changes today. She is to continue to monitor.   6. HLD - on statin - will recheck lab on return.   Current medicines are reviewed with the patient today.  The patient does not have concerns  regarding medicines other than what has been noted above.  The following changes have been made:  See above.  Labs/ tests  ordered today include:    Orders Placed This Encounter  Procedures  . EKG 12-Lead     Disposition:   FU with me in about 2 months. Labs on return.    Patient is agreeable to this plan and will call if any problems develop in the interim.   SignedTruitt Merle, NP  06/27/2020 3:38 PM  [facility] [address] [place], [place]  [address] Phone: [phone] Fax: [phone]

## 2020-06-27 ENCOUNTER — Encounter: Payer: Self-pay | Admitting: Nurse Practitioner

## 2020-06-27 ENCOUNTER — Other Ambulatory Visit: Payer: Self-pay

## 2020-06-27 ENCOUNTER — Ambulatory Visit (INDEPENDENT_AMBULATORY_CARE_PROVIDER_SITE_OTHER): Payer: Medicare Other | Admitting: Nurse Practitioner

## 2020-06-27 VITALS — BP 138/72 | HR 54 | Ht 64.0 in | Wt 143.0 lb

## 2020-06-27 DIAGNOSIS — I5022 Chronic systolic (congestive) heart failure: Secondary | ICD-10-CM | POA: Diagnosis not present

## 2020-06-27 DIAGNOSIS — Z79899 Other long term (current) drug therapy: Secondary | ICD-10-CM | POA: Diagnosis not present

## 2020-06-27 DIAGNOSIS — R001 Bradycardia, unspecified: Secondary | ICD-10-CM | POA: Diagnosis not present

## 2020-06-27 DIAGNOSIS — I480 Paroxysmal atrial fibrillation: Secondary | ICD-10-CM

## 2020-06-27 NOTE — Patient Instructions (Addendum)
After Visit Summary:  We will be checking the following labs today - NONE   Medication Instructions:    Continue with your current medicines.    If you need a refill on your cardiac medications before your next appointment, please call your pharmacy.     Testing/Procedures To Be Arranged:  N/A  Follow-Up:   See me in about 2 months with fasting labs    At [facility], you and your health needs are our priority.  As part of our continuing mission to provide you with exceptional heart care, we have created designated Provider Care Teams.  These Care Teams include your primary Cardiologist (physician) and Advanced Practice Providers (APPs -  Physician Assistants and Nurse Practitioners) who all work together to provide you with the care you need, when you need it.  Special Instructions:  . Stay safe, wash your hands for at least 20 seconds and wear a mask when needed.  . It was good to talk with you today.  Marland Kitchen Keep a check on your BP and Hr for me.    Call the [place] office at [phone] if you have any questions, problems or concerns.

## 2020-06-29 ENCOUNTER — Encounter: Payer: Self-pay | Admitting: Gastroenterology

## 2020-06-29 ENCOUNTER — Ambulatory Visit (INDEPENDENT_AMBULATORY_CARE_PROVIDER_SITE_OTHER): Payer: Medicare Other | Admitting: Gastroenterology

## 2020-06-29 ENCOUNTER — Telehealth: Payer: Self-pay

## 2020-06-29 VITALS — BP 100/54 | HR 56 | Ht 62.0 in | Wt 144.1 lb

## 2020-06-29 DIAGNOSIS — R195 Other fecal abnormalities: Secondary | ICD-10-CM

## 2020-06-29 DIAGNOSIS — Z9049 Acquired absence of other specified parts of digestive tract: Secondary | ICD-10-CM

## 2020-06-29 DIAGNOSIS — Z1211 Encounter for screening for malignant neoplasm of colon: Secondary | ICD-10-CM | POA: Diagnosis not present

## 2020-06-29 MED ORDER — SUPREP BOWEL PREP KIT 17.5-3.13-1.6 GM/177ML PO SOLN: 1.0000 | ORAL | 0 refills | Status: DC

## 2020-06-29 NOTE — Telephone Encounter (Addendum)
Pt has been informed  Okay to hold Eliquis x2 days prior to procedure.

## 2020-06-29 NOTE — Progress Notes (Signed)
[place] VISIT   Primary Care Provider [name], MD [address] [place] [address] [phone]  Referring Provider [name], [place] [address] [place],  [place] [address] [phone]  Patient Profile: Erin Wong is a 69 y.o. female with a pmh significant for CHF, atrial fibrillation (on anticoagulation), asthma, hypertension, hyperlipidemia, CRI, status post hysterectomy, prior colon resection (reported 2011 unclear if this was diverticular related or not).  The patient presents to the [facility] for an evaluation and management of problem(s) noted below:  Problem List 1. Positive colorectal cancer screening using Cologuard test   2. Colon cancer screening   3. History of colectomy     History of Present Illness This is the patient's first visit to the outpatient [facility] clinic.  The patient underwent colon cancer screening in May of this year via Cologuard testing.  This returned positive.  She had a referral placed but missed our clinic on 2 occasions.  She is now here.  She describes never undergoing a colonoscopy previously for colon cancer screening.  However, she does describe that back in 2011 she had a perforation and had a portion of her large intestine removed.  It is not completely clear if this was diverticular related although she states that she "ate a nutshell and it caused a perforation".  She describes never undergoing a colonoscopy after that procedure either.  She has normal bowel movements on a daily basis.  She denies any blood in her stools.  She is on chronic anticoagulation for her atrial fibrillation.  GI Review of Systems Positive as above Negative for pyrosis, dysphagia, odynophagia, nausea, vomiting, pain, change in bowel habits, melena, hematochezia  Review of Systems General: Denies fevers/chills/weight loss unintentionally HEENT: Denies oral  lesions Cardiovascular: Denies chest pain/palpitations Pulmonary: Denies shortness of breath Gastroenterological: See HPI Genitourinary: Denies darkened urine or hematuria Hematological: Denies easy bruising/bleeding Dermatological: Denies jaundice Psychological: Mood is stable   Medications Current Outpatient Medications  Medication Sig Dispense Refill   albuterol (PROVENTIL HFA;VENTOLIN HFA) 108 (90 Base) MCG/ACT inhaler Inhale 1 puff into the lungs every 6 (six) hours as needed for wheezing or shortness of breath. 18 g 3   amiodarone (PACERONE) 200 MG tablet Take 1 tablet (200 mg total) by mouth daily. 90 tablet 3   apixaban (ELIQUIS) 5 MG TABS tablet Take 1 tablet (5 mg total) by mouth 2 (two) times daily. 60 tablet 11   atorvastatin (LIPITOR) 40 MG tablet TAKE 1 TABLET BY MOUTH EVERY DAY 90 tablet 1   calcium gluconate 500 MG tablet Take 500 mg by mouth daily.      carvedilol (COREG) 6.25 MG tablet Take 1 tablet (6.25 mg total) by mouth 2 (two) times daily. 60 tablet 3   cholecalciferol (VITAMIN D) 1000 units tablet Take 1 tablet (1,000 Units total) by mouth daily. 90 tablet 1   fluticasone (FLONASE) 50 MCG/ACT nasal spray Place 2 sprays into both nostrils daily. 16 g 6   Fluticasone-Salmeterol (ADVAIR DISKUS) 100-50 MCG/DOSE AEPB INHALE 1 PUFF INTO THE LUNGS TWICE A DAY 180 each 1   furosemide (LASIX) 40 MG tablet Take 1 tablet (40 mg total) by mouth daily. 90 tablet 3   omeprazole (PRILOSEC) 20 MG capsule TAKE 1 CAPSULE BY MOUTH EVERY DAY 90 capsule 3   polyethylene glycol (MIRALAX / GLYCOLAX) 17 g packet Take 17 g by mouth daily.     traMADol (ULTRAM) 50  MG tablet TAKE 2 TABLETS BY MOUTH 3 TIMES A DAY AS NEEDED FOR PAIN 180 tablet 0   Na Sulfate-K Sulfate-Mg Sulf (SUPREP BOWEL PREP KIT) 17.5-3.13-1.6 GM/177ML SOLN Take 1 kit by mouth as directed. For colonoscopy prep 354 mL 0   No current facility-administered medications for this visit.    Allergies No Known  Allergies  Histories Past Medical History:  Diagnosis Date   Allergy    Arthritis    Asthma    Atrial fibrillation (HCC)    Benign essential HTN    CHF (congestive heart failure) (HCC)    CKD (chronic kidney disease) stage 3, GFR 30-59 ml/min (HCC)    HLD (hyperlipidemia)    HTN (hypertension)    Pneumonia    Past Surgical History:  Procedure Laterality Date   ABDOMINAL HYSTERECTOMY  10/04/1995   Hysterectomy and Bilateral oophorectomy   CARDIOVERSION N/A 01/17/2020   Procedure: CARDIOVERSION;  Surgeon: [name], MD;  Location: [facility];  Service: Cardiovascular;  Laterality: N/A;   COLON SURGERY  09/2009   hole in colon   FRACTURE SURGERY Right 08/02/2011   Right Leg--Plates & Screws   HAND SURGERY Right 09/02/2006   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--Arthritis   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   at time of colon surgery   TEE WITHOUT CARDIOVERSION N/A 01/17/2020   Procedure: TRANSESOPHAGEAL ECHOCARDIOGRAM (TEE);  Surgeon: [name], MD;  Location: [facility] ENDOSCOPY;  Service: Cardiovascular;  Laterality: N/A;   Social History   Socioeconomic History   Marital status: Married    Spouse name: Not on file   Number of children: 3   Years of education: Not on file   Highest education level: Not on file  Occupational History   Occupation: retired  Tobacco Use   Smoking status: Former Smoker    Types: Cigarettes    Quit date: 01/10/1991    Years since quitting: 29.4   Smokeless tobacco: Never Used  Vaping Use   Vaping Use: Never used  Substance and Sexual Activity   Alcohol use: Not Currently    Comment: quit in May/2021   Drug use: No   Sexual activity: Never  Other Topics Concern   Not on file  Social History Narrative   Not on file   Social Determinants of Health   Financial Resource Strain:    Difficulty of Paying Living Expenses: Not on file  Food Insecurity:    Worried About Charity fundraiser in the Last Year: Not on  file   [place] in the Last Year: Not on file  Transportation Needs:    Lack of Transportation (Medical): Not on file   Lack of Transportation (Non-Medical): Not on file  Physical Activity:    Days of Exercise per Week: Not on file   Minutes of Exercise per Session: Not on file  Stress:    Feeling of Stress : Not on file  Social Connections:    Frequency of Communication with Friends and Family: Not on file   Frequency of Social Gatherings with Friends and Family: Not on file   Attends Religious Services: Not on file   Active Member of Clubs or Organizations: Not on file   Attends Archivist Meetings: Not on file   Marital Status: Not on file  Intimate Partner Violence:    Fear of Current or Ex-Partner: Not on file   Emotionally Abused: Not on file   Physically Abused: Not on file   Sexually Abused:  Not on file   Family History  Problem Relation Age of Onset   Arthritis Mother    Miscarriages / [place] Mother    Lung cancer Mother        Had quit smoking for 25 years   Alcohol abuse Father    Alcohol abuse Brother    Kidney cancer Brother    Breast cancer Sister    COPD Brother    Asthma Maternal Aunt    Diabetes Maternal Aunt    Colon cancer Neg Hx    Esophageal cancer Neg Hx    Inflammatory bowel disease Neg Hx    Liver disease Neg Hx    Pancreatic cancer Neg Hx    Rectal cancer Neg Hx    Stomach cancer Neg Hx    I have reviewed her medical, social, and family history in detail and updated the electronic medical record as necessary.    PHYSICAL EXAMINATION  BP (!) 100/54 (BP Location: Left Arm, Patient Position: Sitting, Cuff Size: Normal)    Pulse (!) 56    Ht 5' 2"  (1.575 m) Comment: height measured without shoes   Wt 144 lb 2 oz (65.4 kg)    BMI 26.36 kg/m  Wt Readings from Last 3 Encounters:  06/29/20 144 lb 2 oz (65.4 kg)  06/27/20 143 lb (64.9 kg)  06/06/20 143 lb 12.8 oz (65.2 kg)  GEN: NAD, appears  stated age, doesn't appear chronically ill, accompanied by husband PSYCH: Cooperative, without pressured speech EYE: Conjunctivae pink, sclerae anicteric ENT: MMM CV: Nontachycardic RESP: No audible wheezing GI: NABS, soft, mildly protuberant, surgical scars present, NT/ND, without rebound or guarding MSK/EXT: Trace bilateral pedal edema SKIN: No jaundice NEURO:  Alert & Oriented x 3, no focal deficits   REVIEW OF DATA  I reviewed the following data at the time of this encounter:  GI Procedures and Studies  No relevant studies to review  Laboratory Studies  Reviewed those in epic  Imaging Studies  No relevant studies to review  ASSESSMENT  Ms. Tuckerman is a 69 y.o. female with a pmh significant for CHF, atrial fibrillation (on anticoagulation), asthma, hypertension, hyperlipidemia, CRI, status post hysterectomy, prior colon resection (reported 2011 unclear if this was diverticular related or not).  The patient is seen today for evaluation and management of:  1. Positive colorectal cancer screening using Cologuard test   2. Colon cancer screening   3. History of colectomy    The patient is hemodynamically stable.  Clinically she is also doing well.  She has a history of a prior colon resection from a possible diverticulosis versus idiopathic (not [place] related).  This occurred back in 2011 however she never underwent colonoscopy before or after.  She has a positive Cologuard test and will need further evaluation.  She has no evidence of anemia at this time based on laboratories done just 1 month ago.  Colonoscopy is recommended.  The risks and benefits of endoscopic evaluation were discussed with the patient; these include but are not limited to the risk of perforation, infection, bleeding, missed lesions, lack of diagnosis, severe illness requiring hospitalization, as well as anesthesia and sedation related illnesses.  The patient is agreeable to proceed.  We will ask the patient's  cardiology and primary care doctor for approval for coming off anticoagulation safely.  All patient questions were answered to the best of my ability, and the patient agrees to the aforementioned plan of action with follow-up as indicated.   PLAN  Proceed with  scheduling colonoscopy Obtain cardiology/PCP approval for holding of anticoagulation-apixaban for at least 1 to 2 days prior to procedure   Orders Placed This Encounter  Procedures   Ambulatory referral to Gastroenterology    New Prescriptions   NA SULFATE-K SULFATE-MG SULF (SUPREP BOWEL PREP KIT) 17.5-3.13-1.6 GM/177ML SOLN    Take 1 kit by mouth as directed. For colonoscopy prep   Modified Medications   No medications on file    Planned Follow Up No follow-ups on file.   Total Time in Face-to-Face and in Coordination of Care for patient including independent/personal interpretation/review of prior testing, medical history, examination, medication adjustment, communicating results with the patient directly, and documentation with the EHR is 45 minutes.   Justice Britain, MD [facility] Advanced Endoscopy Office # [phone]

## 2020-06-29 NOTE — Telephone Encounter (Signed)
   Primary Cardiologist: Jenkins Rouge, MD  Chart reviewed as part of pre-operative protocol coverage.   Per pharmacy recommendations, patient can hold eliquis 2 days prior to her upcoming colonoscopy and should restart eliquis when cleared to do so by her gastroenterologist.   I will route this recommendation to the requesting party via [facility] fax function and remove from pre-op pool. Please call with questions.  Abigail Butts, PA-C 06/29/2020, 2:19 PM

## 2020-06-29 NOTE — Patient Instructions (Signed)
You have been scheduled for a colonoscopy. Please follow written instructions given to you at your visit today.  Please pick up your prep supplies at the pharmacy within the next 1-3 days. If you use inhalers (even only as needed), please bring them with you on the day of your procedure.   If you are age 70 or older, your body mass index should be between 23-30. Your Body mass index is 26.36 kg/m. If this is out of the aforementioned range listed, please consider follow up with your Primary Care Provider.  If you are age 43 or younger, your body mass index should be between 19-25. Your Body mass index is 26.36 kg/m. If this is out of the aformentioned range listed, please consider follow up with your Primary Care Provider.    You will be contaced by our office prior to your procedure for directions on holding your Eliquis.  If you do not hear from our office 1 week prior to your scheduled procedure, please call [phone] to discuss.   We have sent the following medications to your pharmacy for you to pick up at your convenience: Suprep   Thank you for choosing me and [facility].  Dr. Rush Landmark

## 2020-06-29 NOTE — Telephone Encounter (Signed)
Patient with diagnosis of afib on Eliquis for anticoagulation.    Procedure: colonoscopy Date of procedure: 08/15/20  CHA2DS2-VASc Score = 4  This indicates a 4.8% annual risk of stroke. The patient's score is based upon: CHF History: 1 HTN History: 1 Diabetes History: 0 Stroke History: 0 Vascular Disease History: 0 Age Score: 1 Gender Score: 1  CrCl 66mL/min Platelet count 465K  Per office protocol, patient can hold Eliquis for 2 days prior to procedure as requested.

## 2020-06-29 NOTE — Telephone Encounter (Signed)
Request for surgical clearance:     Endoscopy Procedure  What type of surgery is being performed?     Colonoscopy  When is this surgery scheduled?     08/15/2020  What type of clearance is required ?   Pharmacy  Are there any medications that need to be held prior to surgery and how long? Eliquis x2 days   Practice name and name of physician performing surgery?      [facility]-Dr.Mansouraty/ Lorilei Horan -CMA  What is your office phone and fax number?      Phone- [phone]  [phone]  Anesthesia type (None, local, MAC, general) ?       MAC

## 2020-07-02 ENCOUNTER — Encounter: Payer: Self-pay | Admitting: Gastroenterology

## 2020-07-02 DIAGNOSIS — Z1211 Encounter for screening for malignant neoplasm of colon: Secondary | ICD-10-CM | POA: Insufficient documentation

## 2020-07-02 DIAGNOSIS — R195 Other fecal abnormalities: Secondary | ICD-10-CM | POA: Insufficient documentation

## 2020-07-02 DIAGNOSIS — Z9049 Acquired absence of other specified parts of digestive tract: Secondary | ICD-10-CM | POA: Insufficient documentation

## 2020-07-11 ENCOUNTER — Telehealth: Payer: Self-pay | Admitting: Family Medicine

## 2020-07-11 ENCOUNTER — Other Ambulatory Visit: Payer: Self-pay | Admitting: Family Medicine

## 2020-07-11 ENCOUNTER — Other Ambulatory Visit: Payer: Self-pay

## 2020-07-11 MED ORDER — ALBUTEROL SULFATE HFA 108 (90 BASE) MCG/ACT IN AERS: 1.0000 | INHALATION_SPRAY | Freq: Four times a day (QID) | RESPIRATORY_TRACT | 3 refills | Status: DC | PRN

## 2020-07-11 NOTE — Telephone Encounter (Signed)
Please advise 

## 2020-07-11 NOTE — Telephone Encounter (Signed)
Refill Ventolin HFA

## 2020-08-03 ENCOUNTER — Other Ambulatory Visit: Payer: Self-pay

## 2020-08-03 MED ORDER — APIXABAN 5 MG PO TABS
5.0000 mg | ORAL_TABLET | Freq: Two times a day (BID) | ORAL | 11 refills | Status: DC
Start: 2020-08-03 — End: 2021-06-25

## 2020-08-14 ENCOUNTER — Other Ambulatory Visit: Payer: Self-pay | Admitting: Family Medicine

## 2020-08-15 ENCOUNTER — Encounter: Payer: Medicare Other | Admitting: Gastroenterology

## 2020-08-15 NOTE — Telephone Encounter (Signed)
Ok to refill??  Last office visit 05/26/2020.  Last refill 07/11/2020.  Ok to add refills to prescription?

## 2020-08-23 NOTE — Progress Notes (Signed)
CARDIOLOGY OFFICE NOTE  Date:  09/06/2020    Erin Wong Date of Birth: 06/01/1951 Medical Record #[id number]  PCP:  [name], MD  Cardiologist:  Gillian Shields    Chief Complaint  Patient presents with  . Follow-up    Seen for Dr. Johnsie Cancel    History of Present Illness: Erin Wong is a 69 y.o. female who presents today for a follow up visit. Seen for Dr. Johnsie Cancel.    She has a history of persistent AF s/p failed DCCV on 01/17/2020 - placed on Amiodarone and Eliquis with plans for repeat cardioversion - however was in NSR. 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    Plan was to have repeat echo after 3 months of therapy - if no improvement in EF - would need to consider ischemic work up.    Seen as a work in back in August by [name], PA - had called in with low heart rates - amiodarone and Coreg were cut back and she was asked to come in for EKG. On exam she was doing ok - had increased her medicines back up - she thought her dizziness was from too much sugar. Had had some volume overload with low sats noted that had resolved.    Holter was done - less than 1% AF burden - average HR was 51. Her regimen was continued.  I then saw her last month - feeling very poorly - quite hypotensive - Echo was updated - EF has recovered - we had to cut multiple medicines back. She was better on return but still "tired" - HR just 50. I cut her Coreg back further. Last seen back in October - doing much better - BP and HR were fine - feeling good and she was very happy with how she was doing.    Comes in today. Here with her husband. She is doing well. She is fasting today. No chest pain. Not short of breath. No palpitations. Not dizzy or lightheaded. More troubled by knee pain - may be looking at going to see ortho at some point - husband is having  his own issues that they are trying to work thru. She feels like she is doing well. No issues. Tolerating her medicines without issue.   Past Medical History:  Diagnosis Date  . Allergy   . Arthritis   . Asthma   . Atrial fibrillation ([place])   . Benign essential HTN   . CHF (congestive heart failure) ([place])   . CKD (chronic kidney disease) stage 3, GFR 30-59 ml/min ([facility])   . HLD (hyperlipidemia)   . HTN (hypertension)   . Pneumonia     Past Surgical History:  Procedure Laterality Date  . ABDOMINAL HYSTERECTOMY  10/04/1995   Hysterectomy and Bilateral oophorectomy  . CARDIOVERSION N/A 01/17/2020   Procedure: CARDIOVERSION;  Surgeon: [name], MD;  Location: [facility] ENDOSCOPY;  Service: Cardiovascular;  Laterality: N/A;  . COLON SURGERY  09/2009   hole in colon  . FRACTURE SURGERY Right 08/02/2011   Right Leg--Plates & Screws  . 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 TEE WITHOUT CARDIOVERSION N/A 01/17/2020   Procedure: TRANSESOPHAGEAL ECHOCARDIOGRAM (TEE);  Surgeon: [name], MD;  Location: [facility] ENDOSCOPY;  Service: Cardiovascular;  Laterality: N/A;     Medications: Current Meds  Medication Sig  . albuterol (VENTOLIN HFA) 108 (90 Base) MCG/ACT inhaler Inhale 1 puff into the lungs every 6 (six) hours as needed for wheezing or shortness of breath.  Marland Kitchen amiodarone (PACERONE) 200 MG tablet Take 1 tablet (200 mg total) by mouth daily.  Marland Kitchen apixaban (ELIQUIS) 5 MG TABS tablet Take 1 tablet (5 mg total) by mouth 2 (two) times daily.  Marland Kitchen atorvastatin (LIPITOR) 40 MG tablet TAKE 1 TABLET BY MOUTH EVERY DAY  . calcium gluconate 500 MG tablet Take 500 mg by mouth daily.   . carvedilol (COREG) 6.25 MG tablet Take 1 tablet (6.25 mg total) by mouth 2 (two) times daily.  . cholecalciferol (VITAMIN D) 1000 units tablet Take 1 tablet (1,000 Units total) by mouth daily.  . fluticasone (FLONASE) 50  MCG/ACT nasal spray Place 2 sprays into both nostrils daily.  . Fluticasone-Salmeterol (ADVAIR DISKUS) 100-50 MCG/DOSE AEPB INHALE 1 PUFF INTO THE LUNGS TWICE A DAY  . furosemide (LASIX) 40 MG tablet Take 1 tablet (40 mg total) by mouth daily.  . Na Sulfate-K Sulfate-Mg Sulf (SUPREP BOWEL PREP KIT) 17.5-3.13-1.6 GM/177ML SOLN Take 1 kit by mouth as directed. For colonoscopy prep  . omeprazole (PRILOSEC) 20 MG capsule TAKE 1 CAPSULE BY MOUTH EVERY DAY  . polyethylene glycol (MIRALAX / GLYCOLAX) 17 g packet Take 17 g by mouth daily.  . traMADol (ULTRAM) 50 MG tablet TAKE 2 TABLETS BY MOUTH 3 TIMES A DAY AS NEEDED FOR PAIN     Allergies: No Known Allergies  Social History: The patient  reports that she quit smoking about 29 years ago. Her smoking use included cigarettes. She has never used smokeless tobacco. She reports previous alcohol use. She reports that she does not use drugs.   Family History: The patient's family history includes Alcohol abuse in her brother and father; Arthritis in her mother; Asthma in her maternal aunt; Breast cancer in her sister; COPD in her brother; Diabetes in her maternal aunt; Kidney cancer in her brother; Lung cancer in her mother; Miscarriages / Stillbirths in her mother.   Review of Systems: Please see the history of present illness.   All other systems are reviewed and negative.   Physical Exam: VS:  BP 126/62   Pulse 65   Ht 5' 2"  (1.575 m)   Wt 145 lb 6.4 oz (66 kg)   SpO2 93%   BMI 26.59 kg/m  .  BMI Body mass index is 26.59 kg/m.  Wt Readings from Last 3 Encounters:  09/06/20 145 lb 6.4 oz (66 kg)  06/29/20 144 lb 2 oz (65.4 kg)  06/27/20 143 lb (64.9 kg)    General: Pleasant. Well developed, well nourished and in no acute distress.   HEENT: Normal.  Neck: Supple, no JVD, carotid bruits, or masses noted.  Cardiac: Regular rate and rhythm. No murmurs, rubs, or gallops. No edema.  Respiratory:  Lungs are clear to auscultation bilaterally  with normal work of breathing.  GI: Soft and nontender.  MS: No deformity or atrophy. Gait and ROM intact.  Skin: Warm and dry. Color is normal.  Neuro:  Strength and sensation are intact and no gross focal deficits noted.  Psych: Alert, appropriate and with normal affect.   LABORATORY DATA:  EKG:  EKG is not ordered today.    Lab Results  Component Value Date   WBC 9.7 05/19/2020   HGB 12.3 05/19/2020   HCT 36.3  05/19/2020   PLT 465 (H) 05/19/2020   GLUCOSE 86 05/19/2020   CHOL 139 01/14/2020   TRIG 58 01/14/2020   HDL 70 01/14/2020   LDLCALC 57 01/14/2020   ALT 32 (H) 02/22/2020   AST 21 02/22/2020   NA 134 (L) 05/19/2020   K 4.3 05/19/2020   CL 97 (L) 05/19/2020   CREATININE 0.92 05/19/2020   BUN 13 05/19/2020   CO2 26 05/19/2020   TSH 1.525 01/13/2020   INR 1.4 (H) 02/13/2020     BNP (last 3 results) Recent Labs    01/13/20 1418 02/12/20 0030  BNP 983.1* 602.0*    ProBNP (last 3 results) No results for input(s): PROBNP in the last 8760 hours.   Other Studies Reviewed Today:  LIMITED ECHO IMPRESSIONS 05/2020   1. Left ventricular ejection fraction, by estimation, is 60 to 65%. The  left ventricle has normal function. The left ventricle has no regional  wall motion abnormalities. Left ventricular diastolic parameters are  consistent with Grade II diastolic  dysfunction (pseudonormalization).   2. Right ventricular systolic function is normal. The right ventricular  size is normal. There is normal pulmonary artery systolic pressure. The  estimated right ventricular systolic pressure is 76.8 mmHg.   3. Left atrial size was mild to moderately dilated.   4. The mitral valve is normal in structure. Mild mitral valve  regurgitation. No evidence of mitral stenosis.   5. The aortic valve is tricuspid. Aortic valve regurgitation is trivial.  No aortic stenosis is present.   6. The inferior vena cava is normal in size with greater than 50%  respiratory  variability, suggesting right atrial pressure of 3 mmHg.   Comparison(s): 01/14/20 EF 30-35%. PA pressure 79mHg.     Monitor Study Highlights 04/2020   SR/SB average HR 51 bpm < 1% burden PAF PAC's < 1% total beats        Echocardiogram 01/14/2020: Impressions:  1. Left ventricular ejection fraction, by estimation, is 30 to 35%. The  left ventricle has moderately decreased function. The left ventricle  demonstrates global hypokinesis. The left ventricular internal cavity size  was mildly dilated. Left ventricular  diastolic parameters are indeterminate. There is akinesis of the left  ventricular, mid-apical inferior wall, anterior wall, inferolateral wall,  anterolateral wall and lateral wall. There is akinesis of the left  ventricular, apical septal wall and apical  segment.   2. Right ventricular systolic function is mildly reduced. The right  ventricular size is mildly enlarged. There is moderately elevated  pulmonary artery systolic pressure. The estimated right ventricular  systolic pressure is 411.5mmHg.   3. Left atrial size was severely dilated.   4. The mitral valve is normal in structure. Mild to moderate mitral valve  regurgitation. No evidence of mitral stenosis.   5. Tricuspid valve regurgitation is mild to moderate.   6. The aortic valve is normal in structure. Aortic valve regurgitation is  trivial. No aortic stenosis is present.   7. The inferior vena cava is dilated in size with <50% respiratory  variability, suggesting right atrial pressure of 15 mmHg.          ASSESSMENT & PLAN:     1. Persistent AF - remains in NSR - on low dose amiodarone - surveillance lab today. I have left her on her current regimen.   2. Chronic anticoagulation - no problems noted - lab today.   3. Chronic systolic HF/NICM - this was felt to be tachycardia mediated -  she has had recovery of her EF - she did have to have reduction in her medicines due to symptomatic hypotension.    4. High risk medicine - lab today.   5. HTN - BP looks fine on her current regimen.   6. HLD - on statin - lab today.   Current medicines are reviewed with the patient today.  The patient does not have concerns regarding medicines other than what has been noted above.  The following changes have been made:  See above.  Labs/ tests ordered today include:    Orders Placed This Encounter  Procedures  . Basic metabolic panel  . CBC  . Hepatic function panel  . TSH  . Lipid panel     Disposition:   FU with Dr. Johnsie Cancel in 4 months with EKG. Labs today. No change with her regimen. She is aware that I am leaving next month.    Patient is agreeable to this plan and will call if any problems develop in the interim.   SignedTruitt Merle, NP  09/06/2020 1:53 PM  [facility] [address] [place], [place]  [address] Phone: [phone] Fax: [phone]

## 2020-09-06 ENCOUNTER — Encounter (INDEPENDENT_AMBULATORY_CARE_PROVIDER_SITE_OTHER): Payer: Self-pay

## 2020-09-06 ENCOUNTER — Ambulatory Visit (INDEPENDENT_AMBULATORY_CARE_PROVIDER_SITE_OTHER): Payer: Medicare Other | Admitting: Nurse Practitioner

## 2020-09-06 ENCOUNTER — Encounter: Payer: Self-pay | Admitting: Nurse Practitioner

## 2020-09-06 ENCOUNTER — Other Ambulatory Visit: Payer: Self-pay

## 2020-09-06 VITALS — BP 126/62 | HR 65 | Ht 62.0 in | Wt 145.4 lb

## 2020-09-06 DIAGNOSIS — I5022 Chronic systolic (congestive) heart failure: Secondary | ICD-10-CM | POA: Diagnosis not present

## 2020-09-06 DIAGNOSIS — I480 Paroxysmal atrial fibrillation: Secondary | ICD-10-CM | POA: Diagnosis not present

## 2020-09-06 DIAGNOSIS — R001 Bradycardia, unspecified: Secondary | ICD-10-CM

## 2020-09-06 DIAGNOSIS — Z79899 Other long term (current) drug therapy: Secondary | ICD-10-CM

## 2020-09-06 DIAGNOSIS — E785 Hyperlipidemia, unspecified: Secondary | ICD-10-CM

## 2020-09-06 NOTE — Patient Instructions (Addendum)
After Visit Summary:  We will be checking the following labs today - BMET, CBC, HPF, Lipids and TSH   Medication Instructions:    Continue with your current medicines.    If you need a refill on your cardiac medications before your next appointment, please call your pharmacy.     Testing/Procedures To Be Arranged:  N/A  Follow-Up:   See Dr. Eden Emms in 4 months with EKG    At [facility], you and your health needs are our priority.  As part of our continuing mission to provide you with exceptional heart care, we have created designated Provider Care Teams.  These Care Teams include your primary Cardiologist (physician) and Advanced Practice Providers (APPs -  Physician Assistants and Nurse Practitioners) who all work together to provide you with the care you need, when you need it.  Special Instructions:  . Stay safe, wash your hands for at least 20 seconds and wear a mask when needed.  . It was good to talk with you today.    Call the [facility] office at [phone] if you have any questions, problems or concerns.

## 2020-09-07 LAB — CBC
Hematocrit: 38 % (ref 34.0–46.6)
Hemoglobin: 12.9 g/dL (ref 11.1–15.9)
MCH: 31.1 pg (ref 26.6–33.0)
MCHC: 33.9 g/dL (ref 31.5–35.7)
MCV: 92 fL (ref 79–97)
Platelets: 415 10*3/uL (ref 150–450)
RBC: 4.15 x10E6/uL (ref 3.77–5.28)
RDW: 12.1 % (ref 11.7–15.4)
WBC: 9.8 10*3/uL (ref 3.4–10.8)

## 2020-09-07 LAB — BASIC METABOLIC PANEL
BUN/Creatinine Ratio: 11 — ABNORMAL LOW (ref 12–28)
BUN: 11 mg/dL (ref 8–27)
CO2: 27 mmol/L (ref 20–29)
Calcium: 9.6 mg/dL (ref 8.7–10.3)
Chloride: 101 mmol/L (ref 96–106)
Creatinine, Ser: 1.04 mg/dL — ABNORMAL HIGH (ref 0.57–1.00)
GFR calc Af Amer: 63 mL/min/{1.73_m2} (ref >59)
GFR calc non Af Amer: 55 mL/min/{1.73_m2} — ABNORMAL LOW (ref >59)
Glucose: 87 mg/dL (ref 65–99)
Potassium: 4.4 mmol/L (ref 3.5–5.2)
Sodium: 142 mmol/L (ref 134–144)

## 2020-09-07 LAB — TSH: TSH: 2.17 u[IU]/mL (ref 0.450–4.500)

## 2020-09-07 LAB — HEPATIC FUNCTION PANEL
ALT: 54 IU/L — ABNORMAL HIGH (ref 0–32)
AST: 42 IU/L — ABNORMAL HIGH (ref 0–40)
Albumin: 4.2 g/dL (ref 3.8–4.8)
Alkaline Phosphatase: 123 IU/L — ABNORMAL HIGH (ref 44–121)
Bilirubin Total: 0.3 mg/dL (ref 0.0–1.2)
Bilirubin, Direct: 0.14 mg/dL (ref 0.00–0.40)
Total Protein: 6.4 g/dL (ref 6.0–8.5)

## 2020-09-07 LAB — LIPID PANEL
Chol/HDL Ratio: 2.1 ratio (ref 0.0–4.4)
Cholesterol, Total: 186 mg/dL (ref 100–199)
HDL: 89 mg/dL (ref >39)
LDL Chol Calc (NIH): 84 mg/dL (ref 0–99)
Triglycerides: 68 mg/dL (ref 0–149)
VLDL Cholesterol Cal: 13 mg/dL (ref 5–40)

## 2020-09-12 ENCOUNTER — Ambulatory Visit (AMBULATORY_SURGERY_CENTER): Payer: Medicare Other | Admitting: Gastroenterology

## 2020-09-12 ENCOUNTER — Encounter: Payer: Self-pay | Admitting: Gastroenterology

## 2020-09-12 ENCOUNTER — Other Ambulatory Visit: Payer: Self-pay

## 2020-09-12 VITALS — BP 123/70 | HR 60 | Temp 97.1°F | Resp 18 | Ht 62.0 in | Wt 144.0 lb

## 2020-09-12 DIAGNOSIS — J45909 Unspecified asthma, uncomplicated: Secondary | ICD-10-CM | POA: Diagnosis not present

## 2020-09-12 DIAGNOSIS — K5730 Diverticulosis of large intestine without perforation or abscess without bleeding: Secondary | ICD-10-CM

## 2020-09-12 DIAGNOSIS — K219 Gastro-esophageal reflux disease without esophagitis: Secondary | ICD-10-CM | POA: Diagnosis not present

## 2020-09-12 DIAGNOSIS — R195 Other fecal abnormalities: Secondary | ICD-10-CM | POA: Diagnosis not present

## 2020-09-12 DIAGNOSIS — D124 Benign neoplasm of descending colon: Secondary | ICD-10-CM

## 2020-09-12 DIAGNOSIS — D122 Benign neoplasm of ascending colon: Secondary | ICD-10-CM

## 2020-09-12 DIAGNOSIS — D123 Benign neoplasm of transverse colon: Secondary | ICD-10-CM | POA: Diagnosis not present

## 2020-09-12 DIAGNOSIS — N183 Chronic kidney disease, stage 3 unspecified: Secondary | ICD-10-CM | POA: Diagnosis not present

## 2020-09-12 DIAGNOSIS — K641 Second degree hemorrhoids: Secondary | ICD-10-CM | POA: Diagnosis not present

## 2020-09-12 DIAGNOSIS — I10 Essential (primary) hypertension: Secondary | ICD-10-CM | POA: Diagnosis not present

## 2020-09-12 MED ORDER — SODIUM CHLORIDE 0.9 % IV SOLN: 500.0000 mL | INTRAVENOUS | Status: DC

## 2020-09-12 NOTE — Patient Instructions (Addendum)
YOU HAD AN ENDOSCOPIC PROCEDURE TODAY AT [facility]:   Refer to the procedure report that was given to you for any specific questions about what was found during the examination.  If the procedure report does not answer your questions, please call your gastroenterologist to clarify.  If you requested that your care partner not be given the details of your procedure findings, then the procedure report has been included in a sealed envelope for you to review at your convenience later.  YOU SHOULD EXPECT: Some feelings of bloating in the abdomen. Passage of more gas than usual.  Walking can help get rid of the air that was put into your GI tract during the procedure and reduce the bloating. If you had a lower endoscopy (such as a colonoscopy or flexible sigmoidoscopy) you may notice spotting of blood in your stool or on the toilet paper. If you underwent a bowel prep for your procedure, you may not have a normal bowel movement for a few days.  Please Note:  You might notice some irritation and congestion in your nose or some drainage.  This is from the oxygen used during your procedure.  There is no need for concern and it should clear up in a day or so.  SYMPTOMS TO REPORT IMMEDIATELY:   Following lower endoscopy (colonoscopy or flexible sigmoidoscopy):  Excessive amounts of blood in the stool  Significant tenderness or worsening of abdominal pains  Swelling of the abdomen that is new, acute  Fever of 100F or higher   For urgent or emergent issues, a gastroenterologist can be reached at any hour by calling [phone]. Do not use MyChart messaging for urgent concerns.    DIET:  We do recommend a small meal at first, but then you may proceed to your regular diet.  Drink plenty of fluids but you should avoid alcoholic beverages for 24 hours. Please follow a High Fiber Diet (see handout given to you by your recovery nurse).  MEDICATIONS: Continue present medications. You may  restart Eliquis in 48 hours (09/14/20) to decrease the risk of bleeding post-intervention. Use FiberCon 1-2 tablets by mouth daily.  Please see handouts given to you by your recovery nurse.  ACTIVITY:  You should plan to take it easy for the rest of today and you should NOT DRIVE or use heavy machinery until tomorrow (because of the sedation medicines used during the test).    FOLLOW UP: Our staff will call the number listed on your records 48-72 hours following your procedure to check on you and address any questions or concerns that you may have regarding the information given to you following your procedure. If we do not reach you, we will leave a message.  We will attempt to reach you two times.  During this call, we will ask if you have developed any symptoms of COVID 19. If you develop any symptoms (ie: fever, flu-like symptoms, shortness of breath, cough etc.) before then, please call [phone].  If you test positive for Covid 19 in the 2 weeks post procedure, please call and report this information to [place].    Thank you for allowing [place] to provide for your healthcare needs today.  If any biopsies were taken you will be contacted by phone or by letter within the next 1-3 weeks.  Please call us at [phone] if you have not heard about the biopsies in 3 weeks.    SIGNATURES/CONFIDENTIALITY: You and/or your care partner have signed paperwork  which will be entered into your electronic medical record.  These signatures attest to the fact that that the information above on your After Visit Summary has been reviewed and is understood.  Full responsibility of the confidentiality of this discharge information lies with you and/or your care-partner.

## 2020-09-12 NOTE — Op Note (Signed)
[place] Patient Name: Erin Wong Procedure Date: 09/12/2020 1:35 PM MRN: [id number] Endoscopist: Justice Britain , MD Age: 70 Referring MD:  Date of Birth: [birth year] Gender: Female Account #: [id number] Procedure:                Colonoscopy Indications:              Positive Cologuard test Medicines:                Monitored Anesthesia Care Procedure:                Pre-Anesthesia Assessment:                           - Prior to the procedure, a History and Physical                            was performed, and patient medications and                            allergies were reviewed. The patient's tolerance of                            previous anesthesia was also reviewed. The risks                            and benefits of the procedure and the sedation                            options and risks were discussed with the patient.                            All questions were answered, and informed consent                            was obtained. Prior Anticoagulants: The patient has                            taken Eliquis (apixaban), last dose was 2 days                            prior to procedure. ASA Grade Assessment: III - A                            patient with severe systemic disease. After                            reviewing the risks and benefits, the patient was                            deemed in satisfactory condition to undergo the                            procedure.  After obtaining informed consent, the colonoscope                            was passed under direct vision. Throughout the                            procedure, the patient's blood pressure, pulse, and                            oxygen saturations were monitored continuously. The                            Olympus [id number]) [id number] was introduced                            through the anus and advanced to the the cecum,                             identified by the ileocecal valve. The colonoscopy                            was performed without difficulty. The patient                            tolerated the procedure. The quality of the bowel                            preparation was adequate. The terminal ileum,                            ileocecal valve, appendiceal orifice, and rectum                            were photographed. Scope In: 1:45:11 PM Scope Out: 2:05:30 PM Scope Withdrawal Time: 0 hours 16 minutes 56 seconds  Total Procedure Duration: 0 hours 20 minutes 19 seconds  Findings:                 The digital rectal exam findings include                            hemorrhoids. Pertinent negatives include no                            palpable rectal lesions.                           The terminal ileum and ileocecal valve appeared                            normal.                           Three sessile polyps were found in the descending  colon, transverse colon and ascending colon. The                            polyps were 2 to 10 mm in size. These polyps were                            removed with a cold snare. Resection and retrieval                            were complete.                           Multiple small-mouthed diverticula were found in                            the recto-sigmoid colon and sigmoid colon.                           Normal mucosa was found in the entire colon                            otherwise.                           Non-bleeding non-thrombosed external and internal                            hemorrhoids were found during retroflexion, during                            perianal exam and during digital exam. The                            hemorrhoids were Grade II (internal hemorrhoids                            that prolapse but reduce spontaneously). Complications:            No immediate complications. Estimated Blood Loss:     Estimated blood  loss was minimal. Impression:               - Hemorrhoids found on digital rectal exam.                           - The examined portion of the ileum was normal.                           - Three 2 to 10 mm polyps in the descending colon,                            in the transverse colon and in the ascending colon,                            removed with a cold snare. Resected and retrieved.                           -  Diverticulosis in the recto-sigmoid colon and in                            the sigmoid colon.                           - Normal mucosa in the entire examined colon                            otherwise.                           - Non-bleeding non-thrombosed external and internal                            hemorrhoids. Recommendation:           - The patient will be observed post-procedure,                            until all discharge criteria are met.                           - Discharge patient to home.                           - Patient has a contact number available for                            emergencies. The signs and symptoms of potential                            delayed complications were discussed with the                            patient. Return to normal activities tomorrow.                            Written discharge instructions were provided to the                            patient.                           - High fiber diet.                           - Use FiberCon 1-2 tablets PO daily.                           - May restart Eliquis in 48 hours (09/14/20) to                            decrease risk of bleeding post-intervention.                           - Continue present medications.                           -  Await pathology results.                           - Repeat colonoscopy in 3 years for surveillance.                           - The findings and recommendations were discussed                            with the patient.                            - The findings and recommendations were discussed                            with the patient's family. Justice Britain, MD 09/12/2020 2:16:55 PM

## 2020-09-12 NOTE — Progress Notes (Signed)
Called to room to assist during endoscopic procedure.  Patient ID and intended procedure confirmed with present staff. Received instructions for my participation in the procedure from the performing physician.  

## 2020-09-12 NOTE — Progress Notes (Signed)
Report to PACU, RN, vss, BBS= Clear.  

## 2020-09-14 ENCOUNTER — Telehealth: Payer: Self-pay

## 2020-09-14 NOTE — Telephone Encounter (Signed)
  Follow up Call-  Call back number 09/12/2020  Post procedure Call Back phone  # [phone]  Permission to leave phone message Yes  Some recent data might be hidden     Patient questions:  Do you have a fever, pain , or abdominal swelling? No. Pain Score  0 *  Have you tolerated food without any problems? Yes.    Have you been able to return to your normal activities? Yes.    Do you have any questions about your discharge instructions: Diet   No. Medications  No. Follow up visit  No.  Do you have questions or concerns about your Care? No.  Actions: * If pain score is 4 or above: No action needed, pain <4. 1. Have you developed a fever since your procedure? no  2.   Have you had an respiratory symptoms (SOB or cough) since your procedure? no  3.   Have you tested positive for COVID 19 since your procedure no  4.   Have you had any family members/close contacts diagnosed with the COVID 19 since your procedure?  no   If yes to any of these questions please route to Joylene John, RN and Joella Prince, RN

## 2020-09-15 ENCOUNTER — Other Ambulatory Visit: Payer: Self-pay | Admitting: Family Medicine

## 2020-09-19 ENCOUNTER — Encounter: Payer: Self-pay | Admitting: Gastroenterology

## 2020-09-26 ENCOUNTER — Other Ambulatory Visit: Payer: Self-pay | Admitting: Nurse Practitioner

## 2020-09-29 ENCOUNTER — Other Ambulatory Visit: Payer: Self-pay

## 2020-09-29 ENCOUNTER — Ambulatory Visit (INDEPENDENT_AMBULATORY_CARE_PROVIDER_SITE_OTHER): Payer: Medicare Other | Admitting: Family Medicine

## 2020-09-29 VITALS — BP 142/70 | HR 58 | Temp 97.6°F | Resp 15 | Ht 62.0 in | Wt 144.0 lb

## 2020-09-29 DIAGNOSIS — M25561 Pain in right knee: Secondary | ICD-10-CM

## 2020-09-29 DIAGNOSIS — G8929 Other chronic pain: Secondary | ICD-10-CM

## 2020-09-29 MED ORDER — OXYCODONE HCL 5 MG PO TABS
5.0000 mg | ORAL_TABLET | Freq: Three times a day (TID) | ORAL | 0 refills | Status: DC | PRN
Start: 2020-09-29 — End: 2020-11-29

## 2020-09-29 NOTE — Progress Notes (Signed)
Subjective:    Patient ID: Erin Wong, female    DOB: 1951/04/14, 70 y.o.   MRN: [id number]  Patient presents today with severe pain in her right knee.  We have been performing cortisone injections over the last year with minimal success.  She is also taking tramadol.  However the pain in her knee has become unbearable.  She suffered a fracture to her tibia and fibula prior to moving to [place].  She underwent ORIF of the right tibia and fibula.  The operative x-rays are quite impressive regarding the amount of hardware necessary to put the bones back together.  The patient fell and twisted her leg underneath her as she fell off a ladder as the mechanism of injury.  Patient has to walk with an antalgic gait.  She has genu valgum of the right knee due to the trauma.  Therefore the weight is poorly and unevenly distributed with the majority of the weight following older the lateral joint line.  She reports audible crepitus and severe pain with ambulation to the point that she is no longer able to enjoy her quality of life Past Medical History:  Diagnosis Date  . Allergy   . Arthritis   . Asthma   . Atrial fibrillation ([place])   . Benign essential HTN   . CHF (congestive heart failure) ([place])   . CKD (chronic kidney disease) stage 3, GFR 30-59 ml/min (HCC)   . HLD (hyperlipidemia)   . HTN (hypertension)   . Pneumonia    Past Surgical History:  Procedure Laterality Date  . ABDOMINAL HYSTERECTOMY  10/04/1995   Hysterectomy and Bilateral oophorectomy  . CARDIOVERSION N/A 01/17/2020   Procedure: CARDIOVERSION;  Surgeon: [name], MD;  Location: [facility] ENDOSCOPY;  Service: Cardiovascular;  Laterality: N/A;  . COLON SURGERY  09/2009   hole in colon  . FRACTURE SURGERY Right 08/02/2011   Right Leg--Plates & Screws  . 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 TEE WITHOUT CARDIOVERSION N/A 01/17/2020    Procedure: TRANSESOPHAGEAL ECHOCARDIOGRAM (TEE);  Surgeon: [name], MD;  Location: [facility] ENDOSCOPY;  Service: Cardiovascular;  Laterality: N/A;   Current Outpatient Medications on File Prior to Visit  Medication Sig Dispense Refill  . albuterol (VENTOLIN HFA) 108 (90 Base) MCG/ACT inhaler Inhale 1 puff into the lungs every 6 (six) hours as needed for wheezing or shortness of breath. 18 g 3  . amiodarone (PACERONE) 200 MG tablet Take 1 tablet (200 mg total) by mouth daily. 90 tablet 3  . apixaban (ELIQUIS) 5 MG TABS tablet Take 1 tablet (5 mg total) by mouth 2 (two) times daily. 60 tablet 11  . atorvastatin (LIPITOR) 40 MG tablet TAKE 1 TABLET BY MOUTH EVERY DAY 90 tablet 1  . calcium gluconate 500 MG tablet Take 500 mg by mouth daily.     . carvedilol (COREG) 6.25 MG tablet TAKE 1 TABLET BY MOUTH TWICE A DAY 60 tablet 3  . cholecalciferol (VITAMIN D) 1000 units tablet Take 1 tablet (1,000 Units total) by mouth daily. 90 tablet 1  . fluticasone (FLONASE) 50 MCG/ACT nasal spray Place 2 sprays into both nostrils daily. 16 g 6  . Fluticasone-Salmeterol (ADVAIR DISKUS) 100-50 MCG/DOSE AEPB INHALE 1 PUFF INTO THE LUNGS TWICE A DAY 180 each 1  . furosemide (LASIX) 40 MG tablet Take 1 tablet (40 mg total) by mouth daily. 90 tablet  3  . omeprazole (PRILOSEC) 20 MG capsule TAKE 1 CAPSULE BY MOUTH EVERY DAY 90 capsule 3  . polyethylene glycol (MIRALAX / GLYCOLAX) 17 g packet Take 17 g by mouth daily.    . traMADol (ULTRAM) 50 MG tablet TAKE 2 TABLETS BY MOUTH 3 TIMES A DAY AS NEEDED FOR PAIN 180 tablet 3  . Na Sulfate-K Sulfate-Mg Sulf (SUPREP BOWEL PREP KIT) 17.5-3.13-1.6 GM/177ML SOLN Take 1 kit by mouth as directed. For colonoscopy prep (Patient not taking: Reported on 09/29/2020) 354 mL 0   Current Facility-Administered Medications on File Prior to Visit  Medication Dose Route Frequency Provider Last Rate Last Admin  . 0.9 %  sodium chloride infusion  500 mL Intravenous Continuous Mansouraty,  Telford Nab., MD       No Known Allergies Social History   Socioeconomic History  . Marital status: Married    Spouse name: Not on file  . Number of children: 3  . Years of education: Not on file  . Highest education level: Not on file  Occupational History  . Occupation: retired  Tobacco Use  . Smoking status: Former Smoker    Types: Cigarettes    Quit date: 01/10/1991    Years since quitting: 29.7  . Smokeless tobacco: Never Used  Vaping Use  . Vaping Use: Never used  Substance and Sexual Activity  . Alcohol use: Not Currently    Comment: quit in May/2021  . Drug use: No  . Sexual activity: Never  Other Topics Concern  . Not on file  Social History Narrative  . Not on file   Social Determinants of Health   Financial Resource Strain: Not on file  Food Insecurity: Not on file  Transportation Needs: Not on file  Physical Activity: Not on file  Stress: Not on file  Social Connections: Not on file  Intimate Partner Violence: Not on file     Review of Systems  All other systems reviewed and are negative.      Objective:   Physical Exam Vitals reviewed.  Constitutional:      Appearance: She is well-developed.  Cardiovascular:     Rate and Rhythm: Normal rate and regular rhythm.     Heart sounds: Normal heart sounds. No murmur heard. No friction rub. No gallop.   Pulmonary:     Effort: Pulmonary effort is normal. No respiratory distress.     Breath sounds: Normal breath sounds. No wheezing or rales.  Abdominal:     General: Bowel sounds are normal. There is no distension.     Palpations: Abdomen is soft. There is no mass.     Tenderness: There is no abdominal tenderness. There is no guarding or rebound.  Musculoskeletal:     Right knee: Effusion present. Decreased range of motion. Tenderness present over the medial joint line and lateral joint line.           Assessment & Plan:  Chronic pain of right knee  At this point I am going to consult  orthopedics and see if they have any surgical options for her.  The knee is not in a normal anatomical position and therefore her leg mechanics are in proper and causing severe pain in her knee with ambulation.  Her quality of life is poor.  I will give her oxycodone 5 mg p.o. 3 times daily as needed pain and stop tramadol.  She is unable to take NSAIDs due to chronic anticoagulation.  Await the results of her orthopedic consultation

## 2020-10-04 ENCOUNTER — Ambulatory Visit (INDEPENDENT_AMBULATORY_CARE_PROVIDER_SITE_OTHER): Payer: Medicare Other | Admitting: Orthopaedic Surgery

## 2020-10-04 ENCOUNTER — Ambulatory Visit (INDEPENDENT_AMBULATORY_CARE_PROVIDER_SITE_OTHER): Payer: Medicare Other

## 2020-10-04 ENCOUNTER — Other Ambulatory Visit: Payer: Self-pay

## 2020-10-04 ENCOUNTER — Encounter: Payer: Self-pay | Admitting: Physician Assistant

## 2020-10-04 DIAGNOSIS — M1731 Unilateral post-traumatic osteoarthritis, right knee: Secondary | ICD-10-CM

## 2020-10-04 DIAGNOSIS — G8929 Other chronic pain: Secondary | ICD-10-CM

## 2020-10-04 DIAGNOSIS — M25561 Pain in right knee: Secondary | ICD-10-CM

## 2020-10-04 NOTE — Progress Notes (Signed)
Office Visit Note   Patient: Erin Wong           Date of Birth: 1950/12/28           MRN: [id number] Visit Date: 10/04/2020              Requested by: [name], MD [address] [facility] [place],  [place] [address] PCP: [name], MD   Assessment & Plan: Visit Diagnoses:  1. Post-traumatic osteoarthritis of right knee     Plan: Impression is advanced degenerative joint disease probably with a posttraumatic component from her previous ORIF tibial plateau fracture.  We have discussed various treatment options to include repeat cortisone injection, viscosupplementation injection and probable staged total knee arthroplasty.  She would like to proceed with cortisone injection today and get Visco supplementation approval.  When she follows up for her viscosupplementation injection, we will have her discuss possible staged knee replacement in depth.  Follow-Up Instructions: Return for visco inj and to discuss possible staged knee replacement.   Orders:  Orders Placed This Encounter  Procedures  . Large Joint Inj: R knee  . XR KNEE 3 VIEW RIGHT   No orders of the defined types were placed in this encounter.     Procedures: Large Joint Inj: R knee on 10/04/2020 10:56 AM Indications: pain Details: 22 G needle, anterolateral approach Medications: 2 mL lidocaine 1 %; 2 mL bupivacaine 0.25 %; 40 mg methylPREDNISolone acetate 40 MG/ML      Clinical Data: No additional findings.   Subjective: Chief Complaint  Patient presents with  . Right Knee - Pain    HPI patient is a pleasant 70 year old female who comes in today with right knee pain.  This is been ongoing for the past few years.  She is status post ORIF right knee tibial plateau fracture back in 2011.  She is doing well until a few years back.  The pain she is having is described as constant and is throughout the entire knee.  Worse with ambulation and at night when trying to sleep.  She is on chronic  oxycodone which mildly helps.  She has had cortisone injections in the past with mild relief of symptoms.  Her last injection was this past fall which helped for a few months.  No previous viscosupplementation injection.  Review of Systems as detailed in HPI.  All others reviewed and are negative.   Objective: Vital Signs: There were no vitals taken for this visit.  Physical Exam well-developed well-nourished female no acute distress.  Alert oriented x3.  Ortho Exam right knee exam shows significant valgus deformity.  Range of motion from 30 to about 95 degrees.  Medial and lateral joint line tenderness.  She has neurovascular intact distally.  Significant pain and crepitus with range of motion.    Specialty Comments:  No specialty comments available.  Imaging: No results found.   PMFS History: Patient Active Problem List   Diagnosis Date Noted  . Positive colorectal cancer screening using Cologuard test 07/02/2020  . History of colectomy 07/02/2020  . Colon cancer screening 07/02/2020  . CHF (congestive heart failure) ([place])   . Acute respiratory failure with hypoxia ([place]) 02/12/2020  . Acute bronchitis 02/12/2020  . Acute systolic heart failure ([place]) 01/19/2020  . Atrial fibrillation with rapid ventricular response ([place]) 01/13/2020  . CKD (chronic kidney disease) stage 3, GFR 30-59 ml/min (HCC)   . Benign essential HTN   . HLD (hyperlipidemia)   .  Hyperlipidemia 02/12/2017  . AKI (acute kidney injury) ([place])   . Chest pain 01/13/2017  . Chest pressure   . Acute kidney injury superimposed on CKD ([place])   . Osteoporosis 10/18/2015  . Vitamin D deficiency 07/20/2015  . HTN (hypertension) 07/17/2015  . GERD (gastroesophageal reflux disease) 01/12/2015  . Chronic constipation 01/12/2015  . History of colon surgery 01/12/2015  . History of total hysterectomy with bilateral salpingo-oophorectomy (BSO) 01/12/2015  . Allergy   . Asthma    Past Medical History:  Diagnosis Date   . Allergy   . Arthritis   . Asthma   . Atrial fibrillation ([place])   . Benign essential HTN   . CHF (congestive heart failure) ([place])   . CKD (chronic kidney disease) stage 3, GFR 30-59 ml/min (HCC)   . HLD (hyperlipidemia)   . HTN (hypertension)   . Pneumonia     Family History  Problem Relation Age of Onset  . Arthritis Mother   . Miscarriages / [place] Mother   . Lung cancer Mother        Had quit smoking for 25 years  . Esophageal cancer Mother   . Alcohol abuse Father   . Alcohol abuse Brother   . Kidney cancer Brother   . Breast cancer Sister   . COPD Brother   . Asthma Maternal Aunt   . Diabetes Maternal Aunt   . Colon cancer Neg Hx   . Inflammatory bowel disease Neg Hx   . Liver disease Neg Hx   . Pancreatic cancer Neg Hx   . Rectal cancer Neg Hx   . Stomach cancer Neg Hx     Past Surgical History:  Procedure Laterality Date  . ABDOMINAL HYSTERECTOMY  10/04/1995   Hysterectomy and Bilateral oophorectomy  . CARDIOVERSION N/A 01/17/2020   Procedure: CARDIOVERSION;  Surgeon: [name], MD;  Location: [facility] ENDOSCOPY;  Service: Cardiovascular;  Laterality: N/A;  . COLON SURGERY  09/2009   hole in colon  . FRACTURE SURGERY Right 08/02/2011   Right Leg--Plates & Screws  . 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 TEE WITHOUT CARDIOVERSION N/A 01/17/2020   Procedure: TRANSESOPHAGEAL ECHOCARDIOGRAM (TEE);  Surgeon: [name], MD;  Location: [facility] ENDOSCOPY;  Service: Cardiovascular;  Laterality: N/A;   Social History   Occupational History  . Occupation: retired  Tobacco Use  . Smoking status: Former Smoker    Types: Cigarettes    Quit date: 01/10/1991    Years since quitting: 29.7  . Smokeless tobacco: Never Used  Vaping Use  . Vaping Use: Never used  Substance and Sexual Activity  . Alcohol use: Not Currently    Comment: quit in May/2021  . Drug use: No  .  Sexual activity: Never

## 2020-10-06 MED ORDER — METHYLPREDNISOLONE ACETATE 40 MG/ML IJ SUSP
40.0000 mg | INTRAMUSCULAR | Status: AC | PRN
  Administered 2020-10-04: 40 mg via INTRA_ARTICULAR

## 2020-10-06 MED ORDER — LIDOCAINE HCL 1 % IJ SOLN
2.0000 mL | INTRAMUSCULAR | Status: AC | PRN
  Administered 2020-10-04: 2 mL

## 2020-10-06 MED ORDER — BUPIVACAINE HCL 0.25 % IJ SOLN
2.0000 mL | INTRAMUSCULAR | Status: AC | PRN
  Administered 2020-10-04: 2 mL via INTRA_ARTICULAR

## 2020-10-24 ENCOUNTER — Telehealth: Payer: Self-pay

## 2020-10-24 NOTE — Telephone Encounter (Signed)
Her posttraumatic arthritis is way too severe to expect much relief from nonsurgical treatments.  I think it is best for her to come back to see [place] in the office so I can have a discussion with her.

## 2020-10-24 NOTE — Telephone Encounter (Signed)
Patients daughter called she stated her pain medication isn't working she would like a call back from Dr.Xu or lis to discuss the next step call back [phone]

## 2020-10-25 ENCOUNTER — Telehealth: Payer: Self-pay

## 2020-10-25 NOTE — Telephone Encounter (Signed)
Made an appt for pt 3/4 to follow up

## 2020-10-25 NOTE — Telephone Encounter (Signed)
This encounter was created in error - please disregard.

## 2020-10-31 ENCOUNTER — Ambulatory Visit (INDEPENDENT_AMBULATORY_CARE_PROVIDER_SITE_OTHER): Payer: Medicare Other | Admitting: Orthopaedic Surgery

## 2020-10-31 VITALS — Ht 62.0 in | Wt 144.0 lb

## 2020-10-31 DIAGNOSIS — M1731 Unilateral post-traumatic osteoarthritis, right knee: Secondary | ICD-10-CM

## 2020-10-31 MED ORDER — HYDROCODONE-ACETAMINOPHEN 5-325 MG PO TABS
1.0000 | ORAL_TABLET | Freq: Every day | ORAL | 0 refills | Status: DC | PRN
Start: 2020-10-31 — End: 2020-11-09

## 2020-10-31 NOTE — Addendum Note (Signed)
Addended by: Precious Bard on: 10/31/2020 03:16 PM   Modules accepted: Orders

## 2020-10-31 NOTE — Progress Notes (Signed)
Office Visit Note   Patient: Erin Wong           Date of Birth: Jun 16, 1951           MRN: [id number] Visit Date: 10/31/2020              Requested by: [name], MD [address] [place],  [place] [address] PCP: [name], MD   Assessment & Plan: Visit Diagnoses:  1. Post-traumatic osteoarthritis of right knee     Plan: Impression is posttraumatic right knee arthritis with valgus deformity and slight flexion contracture with retained hardware.  X-rays reviewed with the patient detail treatment options discussed and based on our discussion patient has elected for staged hardware removal followed by right total knee replacement.  Risks including infection, stiffness, peroneal nerve palsy, DVT all reviewed with the patient and her husband in detail.  Questions encouraged and answered.  They agreed to proceed with the first portion of the surgery which will be hardware removal in the near future pending cardiac clearance.  She will need to come off of Eliquis for 3 days prior to surgery.  Hydrocodone refilled today.  Follow-Up Instructions: Return if symptoms worsen or fail to improve.   Orders:  No orders of the defined types were placed in this encounter.  Meds ordered this encounter  Medications  . HYDROcodone-acetaminophen (NORCO) 5-325 MG tablet    Sig: Take 1-2 tablets by mouth daily as needed.    Dispense:  20 tablet    Refill:  0      Procedures: No procedures performed   Clinical Data: No additional findings.   Subjective: Chief Complaint  Patient presents with  . Right Knee - Pain    Peri is a 70 year old female here for consultation for posttraumatic arthritis of the right knee.  [place]  She had ORIF at that time.  Unfortunate she developed posttraumatic arthritis with valgus deformity.  She has chronic and severe pain.  She is unable to walk without any assistive  devices.  Unable to do daily activities without severe pain.  She has had a prior cortisone injection without any significant relief.  She has been taking tramadol for the pain with temporary relief.   Review of Systems  Constitutional: Negative.   HENT: Negative.   Eyes: Negative.   Respiratory: Negative.   Cardiovascular: Negative.   Endocrine: Negative.   Musculoskeletal: Negative.   Neurological: Negative.   Hematological: Negative.   Psychiatric/Behavioral: Negative.   All other systems reviewed and are negative.    Objective: Vital Signs: Ht 5\' 2"  (1.575 m)   Wt 144 lb (65.3 kg)   BMI 26.34 kg/m   Physical Exam Vitals and nursing note reviewed.  Constitutional:      Appearance: She is well-developed and well-nourished.  Pulmonary:     Effort: Pulmonary effort is normal.  Skin:    General: Skin is warm.     Capillary Refill: Capillary refill takes less than 2 seconds.  Neurological:     Mental Status: She is alert and oriented to person, place, and time.  Psychiatric:        Mood and Affect: Mood and affect normal.        Behavior: Behavior normal.        Thought Content: Thought content normal.        Judgment: Judgment normal.     Ortho Exam  Right knee shows valgus deformity with a slight varus jog.  Fully healed surgical scars.  No joint effusion.  10 degree flexion contracture.  Pain with flexion past 90 degrees. Specialty Comments:  No specialty comments available.  Imaging: No results found.   PMFS History: Patient Active Problem List   Diagnosis Date Noted  . Positive colorectal cancer screening using Cologuard test 07/02/2020  . History of colectomy 07/02/2020  . Colon cancer screening 07/02/2020  . CHF (congestive heart failure) ([place])   . Acute respiratory failure with hypoxia ([place]) 02/12/2020  . Acute bronchitis 02/12/2020  . Acute systolic heart failure ([place]) 01/19/2020  . Atrial fibrillation with rapid ventricular response ([place])  01/13/2020  . CKD (chronic kidney disease) stage 3, GFR 30-59 ml/min (HCC)   . Benign essential HTN   . HLD (hyperlipidemia)   . Hyperlipidemia 02/12/2017  . AKI (acute kidney injury) ([place])   . Chest pain 01/13/2017  . Chest pressure   . Acute kidney injury superimposed on CKD ([place])   . Osteoporosis 10/18/2015  . Vitamin D deficiency 07/20/2015  . HTN (hypertension) 07/17/2015  . GERD (gastroesophageal reflux disease) 01/12/2015  . Chronic constipation 01/12/2015  . History of colon surgery 01/12/2015  . History of total hysterectomy with bilateral salpingo-oophorectomy (BSO) 01/12/2015  . Allergy   . Asthma    Past Medical History:  Diagnosis Date  . Allergy   . Arthritis   . Asthma   . Atrial fibrillation ([place])   . Benign essential HTN   . CHF (congestive heart failure) ([place])   . CKD (chronic kidney disease) stage 3, GFR 30-59 ml/min (HCC)   . HLD (hyperlipidemia)   . HTN (hypertension)   . Pneumonia     Family History  Problem Relation Age of Onset  . Arthritis Mother   . Miscarriages / [place] Mother   . Lung cancer Mother        Had quit smoking for 25 years  . Esophageal cancer Mother   . Alcohol abuse Father   . Alcohol abuse Brother   . Kidney cancer Brother   . Breast cancer Sister   . COPD Brother   . Asthma Maternal Aunt   . Diabetes Maternal Aunt   . Colon cancer Neg Hx   . Inflammatory bowel disease Neg Hx   . Liver disease Neg Hx   . Pancreatic cancer Neg Hx   . Rectal cancer Neg Hx   . Stomach cancer Neg Hx     Past Surgical History:  Procedure Laterality Date  . ABDOMINAL HYSTERECTOMY  10/04/1995   Hysterectomy and Bilateral oophorectomy  . CARDIOVERSION N/A 01/17/2020   Procedure: CARDIOVERSION;  Surgeon: [name], MD;  Location: [facility] ENDOSCOPY;  Service: Cardiovascular;  Laterality: N/A;  . COLON SURGERY  09/2009   hole in colon  . FRACTURE SURGERY Right 08/02/2011   Right Leg--Plates & Screws  . HAND SURGERY Right 09/02/2006    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--Arthritis  . 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 TEE WITHOUT CARDIOVERSION N/A 01/17/2020   Procedure: TRANSESOPHAGEAL ECHOCARDIOGRAM (TEE);  Surgeon: [name], MD;  Location: [facility] ENDOSCOPY;  Service: Cardiovascular;  Laterality: N/A;   Social History   Occupational History  . Occupation: retired  Tobacco Use  . Smoking status: Former Smoker    Types: Cigarettes    Quit date: 01/10/1991    Years since quitting: 29.8  . Smokeless tobacco: Never Used  Vaping Use  .  Vaping Use: Never used  Substance and Sexual Activity  . Alcohol use: Not Currently    Comment: quit in May/2021  . Drug use: No  . Sexual activity: Never

## 2020-11-01 LAB — PREALBUMIN: Prealbumin: 28 mg/dL (ref 17–34)

## 2020-11-01 LAB — EXTRA LAV TOP TUBE

## 2020-11-08 ENCOUNTER — Telehealth: Payer: Self-pay | Admitting: Orthopaedic Surgery

## 2020-11-08 NOTE — Telephone Encounter (Signed)
Pt called stating she is waiting to be scheduled for surgery and will need a refill of her norco 5-325; pt would like a CB when this is ready at [facility] [address].  [phone]

## 2020-11-08 NOTE — Telephone Encounter (Signed)
See msg below

## 2020-11-09 ENCOUNTER — Other Ambulatory Visit: Payer: Self-pay | Admitting: Physician Assistant

## 2020-11-09 MED ORDER — HYDROCODONE-ACETAMINOPHEN 5-325 MG PO TABS
1.0000 | ORAL_TABLET | Freq: Every day | ORAL | 0 refills | Status: DC | PRN
Start: 2020-11-09 — End: 2020-11-15

## 2020-11-09 NOTE — Telephone Encounter (Signed)
Sent in

## 2020-11-09 NOTE — Telephone Encounter (Signed)
Patient aware.

## 2020-11-14 ENCOUNTER — Telehealth: Payer: Self-pay | Admitting: Orthopaedic Surgery

## 2020-11-14 ENCOUNTER — Other Ambulatory Visit: Payer: Self-pay | Admitting: Family Medicine

## 2020-11-14 ENCOUNTER — Telehealth: Payer: Self-pay

## 2020-11-14 ENCOUNTER — Other Ambulatory Visit: Payer: Self-pay

## 2020-11-14 DIAGNOSIS — I10 Essential (primary) hypertension: Secondary | ICD-10-CM

## 2020-11-14 MED ORDER — TRAMADOL HCL 50 MG PO TABS: 50.0000 mg | ORAL_TABLET | Freq: Three times a day (TID) | ORAL | 0 refills | Status: DC | PRN

## 2020-11-14 NOTE — Telephone Encounter (Signed)
Can you call in tramadol 50mg  1 tid prn pain #30 as long as she does not have a hx of seizures

## 2020-11-14 NOTE — Telephone Encounter (Signed)
Patient called advised the pharmacy do not have the Rx yet for Hydrocodone because the truck has not delivered the medications yet. Patient said she already have Tramadol and do not need another Rx for that. Patient asked if she can get Oxycodone since the delivery truck have not come yet to bring the Hydrocodone? The number to contact patient is [phone]

## 2020-11-14 NOTE — Telephone Encounter (Signed)
Rx called in patient aware

## 2020-11-14 NOTE — Addendum Note (Signed)
Addended by: Sheral Flow on: 11/14/2020 03:12 PM   Modules accepted: Orders

## 2020-11-14 NOTE — Telephone Encounter (Signed)
Pt called stating that the medication that was called in for her is out of stock. She would like a different pain medication to be called in for her.  Medication is ( hydrocodone )

## 2020-11-15 ENCOUNTER — Other Ambulatory Visit: Payer: Self-pay | Admitting: Physician Assistant

## 2020-11-15 MED ORDER — HYDROCODONE-ACETAMINOPHEN 5-325 MG PO TABS: 1.0000 | ORAL_TABLET | Freq: Every day | ORAL | 0 refills | Status: DC | PRN

## 2020-11-15 NOTE — Telephone Encounter (Signed)
Sent in

## 2020-11-15 NOTE — Telephone Encounter (Signed)
We can send to different pharmacy

## 2020-11-15 NOTE — Telephone Encounter (Signed)
[facility] [address]  °

## 2020-11-20 ENCOUNTER — Telehealth: Payer: Self-pay

## 2020-11-20 NOTE — Telephone Encounter (Signed)
Pt called asking about her surgery status.  Please advise

## 2020-11-21 NOTE — Telephone Encounter (Signed)
I called patient and no answer.

## 2020-11-22 ENCOUNTER — Telehealth: Payer: Self-pay | Admitting: Orthopaedic Surgery

## 2020-11-22 NOTE — Telephone Encounter (Signed)
Patient called requesting a refill of hydrocodone. [facility] [address]. Patient phone number is [phone].

## 2020-11-22 NOTE — Telephone Encounter (Signed)
I called patient and advised received surgery order yesterday.  Sent for cardiac clearance.  Will call her back to schedule once cleared.

## 2020-11-22 NOTE — Telephone Encounter (Signed)
Just spoke with patient regarding surgery and obtaining clearance.  She is requesting Oxycodone instead of Hydrocodone.

## 2020-11-23 ENCOUNTER — Other Ambulatory Visit: Payer: Self-pay | Admitting: Physician Assistant

## 2020-11-23 ENCOUNTER — Telehealth: Payer: Self-pay | Admitting: *Deleted

## 2020-11-23 MED ORDER — HYDROCODONE-ACETAMINOPHEN 5-325 MG PO TABS: 1.0000 | ORAL_TABLET | Freq: Every day | ORAL | 0 refills | Status: DC | PRN

## 2020-11-23 NOTE — Telephone Encounter (Signed)
Clinical pharmacist to review Eliquis 

## 2020-11-23 NOTE — Telephone Encounter (Signed)
What do you think?  She is needing pain meds quite frequently, and I feel like oxy will not work after sx if she starts taking them now

## 2020-11-23 NOTE — Telephone Encounter (Signed)
Sounds good  Erin Wong, can you let her know that norco is the strongest we can do before surgery.  Please take as sparingly as possible so that the pain meds after surgery are as effective as possible

## 2020-11-23 NOTE — Telephone Encounter (Signed)
Patient with diagnosis of afib on Eliquis for anticoagulation.     Procedure: Right knee hardware removal Date of procedure: TBD  CHA2DS2-VASc Score = 4  This indicates a 4.8% annual risk of stroke. The patient's score is based upon: CHF History: Yes HTN History: Yes Diabetes History: No Stroke History: No Vascular Disease History: No Age Score: 1 Gender Score: 1   CrCl 45 mL/min using adjusted body weight Platelet count 415K  Per office protocol, patient can hold Eliquis for 3 days prior to procedure as requested.    Patient should restart Eliquis on the evening of procedure or day after, at discretion of procedure MD.

## 2020-11-23 NOTE — Telephone Encounter (Signed)
Norco is the strongest we can do prior to surgery.  I agree.

## 2020-11-23 NOTE — Telephone Encounter (Signed)
   [facility] Pre-operative Risk Assessment    HEARTCARE STAFF: - Please ensure there is not already an duplicate clearance open for this procedure. - Under Visit Info/Reason for Call, type in Other and utilize the format Clearance MM/DD/YY or Clearance TBD. Do not use dashes or single digits. - If request is for dental extraction, please clarify the # of teeth to be extracted.  Request for surgical clearance:  1. What type of surgery is being performed? RIGHT KNEE HARDWARE REMOVAL   2. When is this surgery scheduled? TBD   3. What type of clearance is required (medical clearance vs. Pharmacy clearance to hold med vs. Both)? BOTH  4. Are there any medications that need to be held prior to surgery and how long? ELIQUIS x 3 DAYS PRIOR   5. Practice name and name of physician performing surgery? [facility]; DR. Eduard Roux   6. What is the office phone number? [phone]   7.   What is the office fax number? [email or web address].   Anesthesia type (None, local, MAC, general) ? CHOICE   Julaine Hua 11/23/2020, 2:18 PM  _________________________________________________________________   (provider comments below)

## 2020-11-23 NOTE — Telephone Encounter (Signed)
I left a message for surgery scheduler Sherrie for Dr. Eduard Roux, if clearance request could please be re-faxed as some of the clearance request note has been cut off. [place] fax [phone]

## 2020-11-24 NOTE — Telephone Encounter (Signed)
   Primary Cardiologist: Jenkins Rouge, MD  Chart reviewed as part of pre-operative protocol coverage. Patient was contacted 11/24/2020 in reference to pre-operative risk assessment for pending surgery as outlined below.  Erin Wong was last seen on 09/06/2020 by Dr. Servando Snare.  Since that day, Erin Wong has done well without chest pain or worsening shortness of breath.  Therefore, based on ACC/AHA guidelines, the patient would be at acceptable risk for the planned procedure without further cardiovascular testing.   Patient has been instructed to hold Eliquis for 3 days prior to the procedure and restart as soon as possible after the surgery at the surgeon's discretion.  The patient was advised that if she develops new symptoms prior to surgery to contact our office to arrange for a follow-up visit, and she verbalized understanding.  I will route this recommendation to the requesting party via Epic fax function and remove from pre-op pool. Please call with questions.  [place], [place] 11/24/2020, 3:12 PM

## 2020-11-24 NOTE — Telephone Encounter (Signed)
Patient aware.

## 2020-11-29 ENCOUNTER — Telehealth: Payer: Self-pay | Admitting: Orthopaedic Surgery

## 2020-11-29 ENCOUNTER — Other Ambulatory Visit: Payer: Self-pay | Admitting: Physician Assistant

## 2020-11-29 MED ORDER — TRAMADOL HCL 50 MG PO TABS: 50.0000 mg | ORAL_TABLET | Freq: Three times a day (TID) | ORAL | 0 refills | Status: DC | PRN

## 2020-11-29 NOTE — Telephone Encounter (Signed)
Patient was originally scheduled for 12-13-20 at [facility] with Dr. Erlinda Hong to have right knee hardware removal.  Patient asked if there was any possibility of moving up to a sooner date because she is having increased pain.  12-04-20 at [facility] has been offered and patient accepted.  She is asking for a refill on pain medicine to tie her over prior to surgery.    cb  [phone]

## 2020-11-29 NOTE — Telephone Encounter (Signed)
Patient called requesting a refill of hydrocodone. Please send to [facility] on [address]. Patient phone number is [phone].

## 2020-11-29 NOTE — Telephone Encounter (Signed)
Too early to refill.  Sent in #20 on 3/24 to take 1-2 daily

## 2020-11-29 NOTE — Telephone Encounter (Signed)
I sent tramadol

## 2020-11-30 ENCOUNTER — Other Ambulatory Visit (HOSPITAL_COMMUNITY)
Admission: RE | Admit: 2020-11-30 | Discharge: 2020-11-30 | Disposition: A | Discharge status: Home / Self Care | Payer: Medicare Other | Source: Ambulatory Visit | Attending: Orthopaedic Surgery | Admitting: Orthopaedic Surgery

## 2020-11-30 DIAGNOSIS — Z20822 Contact with and (suspected) exposure to covid-19: Secondary | ICD-10-CM | POA: Insufficient documentation

## 2020-11-30 DIAGNOSIS — Z01812 Encounter for preprocedural laboratory examination: Secondary | ICD-10-CM | POA: Insufficient documentation

## 2020-11-30 LAB — SARS CORONAVIRUS 2 (TAT 6-24 HRS): SARS Coronavirus 2: NEGATIVE

## 2020-11-30 NOTE — Telephone Encounter (Signed)
I talked to the pt. She stated she has enough tramadol. I informed her to take tylenol between doses to help with breakthrough pain. She stated understanding

## 2020-11-30 NOTE — Telephone Encounter (Signed)
I spoke with the pt and she stated understanding.

## 2020-12-01 ENCOUNTER — Encounter (HOSPITAL_COMMUNITY): Payer: Self-pay | Admitting: Orthopaedic Surgery

## 2020-12-01 NOTE — Anesthesia Preprocedure Evaluation (Addendum)
Anesthesia Evaluation  Patient identified by MRN, date of birth, ID band Patient awake    Reviewed: Allergy & Precautions, NPO status , Patient's Chart, lab work & pertinent test results, reviewed documented beta blocker date and time   History of Anesthesia Complications Negative for: history of anesthetic complications  Airway Mallampati: II  TM Distance: >3 FB Neck ROM: Full    Dental  (+) Edentulous Upper   Pulmonary asthma , former smoker,    Pulmonary exam normal        Cardiovascular hypertension, Pt. on medications and Pt. on home beta blockers +CHF (EF improved from 30% to 60%)  Normal cardiovascular exam+ dysrhythmias Atrial Fibrillation      Neuro/Psych negative neurological ROS     GI/Hepatic Neg liver ROS, GERD  ,  Endo/Other  negative endocrine ROS  Renal/GU CRFRenal disease (CKD3)  negative genitourinary   Musculoskeletal H/o tibial plateau fx s/p ORIF   Abdominal   Peds  Hematology Eliquis   Anesthesia Other Findings  Preoperative cardiology input outlined on 11/24/20 by Almyra Deforest, PA, "...Rhetta Mura last seen on 1/5/2022by Dr. Servando Snare. Since that day, SHAWNIA VIZCARRONDO done well without chest pain or worsening shortness of breath. Therefore, based on ACC/AHA guidelines, the patient would be at acceptable risk for the planned procedure without further cardiovascular testing.Patient has been instructed to hold Eliquis for 3 days prior to the procedure and restart as soon as possible after the surgery at the surgeon's discretion..."  Echo 05/24/20: EF 60-65%, g2dd, normal RV function, est RVSP 33, mild-mod LAD, mild MR  Reproductive/Obstetrics                          Anesthesia Physical Anesthesia Plan  ASA: III  Anesthesia Plan: General   Post-op Pain Management:    Induction: Intravenous  PONV Risk Score and Plan: Ondansetron, Dexamethasone, Midazolam and  Treatment may vary due to age or medical condition  Airway Management Planned: LMA  Additional Equipment: None  Intra-op Plan:   Post-operative Plan: Extubation in OR  Informed Consent: I have reviewed the patients History and Physical, chart, labs and discussed the procedure including the risks, benefits and alternatives for the proposed anesthesia with the patient or authorized representative who has indicated his/her understanding and acceptance.     Dental advisory given  Plan Discussed with:   Anesthesia Plan Comments: (PAT note written 12/01/2020 by Myra Gianotti, PA-C. )       Anesthesia Quick Evaluation

## 2020-12-01 NOTE — Progress Notes (Signed)
Mrs. Erin Wong denies chest pain or shortness of breath. Patient was tested for Covid and has been in quarantine since that time.

## 2020-12-01 NOTE — Progress Notes (Signed)
Anesthesia Chart Review:  Case: [id number] Date/Time: 12/04/20 1109   Procedure: rigth knee hardware removal (Right Knee)   Anesthesia type: Choice   Pre-op diagnosis: right knee hardware   Location: [facility] OR ROOM 04 / [place] [facility]   Surgeons: Leandrew Koyanagi, MD      DISCUSSION: Patient is a 70 year old female scheduled for the above procedure.  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  Preoperative cardiology input outlined on 11/24/20 by Almyra Deforest, PA, "...Erin Wong was last seen on 09/06/2020 by Dr. Servando Snare.  Since that day, Erin Wong has done well without chest pain or worsening shortness of breath.  Therefore, based on ACC/AHA guidelines, the patient would be at acceptable risk for the planned procedure without further cardiovascular testing.   Patient has been instructed to hold Eliquis for 3 days prior to the procedure and restart as soon as possible after the surgery at the surgeon's discretion..."  11/30/20 presurgical COVID-19 test negative. Anesthesia team to evaluate on the day of surgery. She will need updated labs.   VS:  BP Readings from Last 3 Encounters:  09/29/20 (!) 142/70  09/12/20 123/70  09/06/20 126/62   Pulse Readings from Last 3 Encounters:  09/29/20 (!) 58  09/12/20 60  09/06/20 65    PROVIDERS: [name], MD is PCP. Established with [place] on 01/12/15 (previously lived in [place], [place]) Jenkins Rouge, MD is  cardiologist. Last visit 09/06/20 with Truitt Merle, NP.   LABS: For day of surgery. As of 09/06/20, Cr 1.04, AST 42, ALT 54, glucose 87, H/H 12.9/38.0, PLT 415.    Colonoscopy 09/12/20 (for + Cologuard): Impression: - Hemorrhoids found on digital rectal exam. - The examined portion of the ileum was normal.  - Three 2 to 10 mm polyps in the descending colon, in the transverse colon and in the ascending colon, removed with a cold snare. Resected and retrieved. - Diverticulosis in the recto-sigmoid colon and in the sigmoid colon. - Normal mucosa in the entire examined colon otherwise. - Non-bleeding non-thrombosed external and internal hemorrhoids. Per Letter by Dr. Rush Landmark, "The polyps removed from your colon were tubular adenomas.Marland KitchenMarland KitchenMarland KitchenAs a result of the size of the largest polyp, I recommend you have a repeat colonoscopy in 3 years to determine if you have developed any new precancerous polyps and to screen for colorectal cancer."    IMAGES: CXR 02/24/20: FINDINGS: The heart size and mediastinal contours are within normal limits. Interval resolution of previously seen heterogeneous airspace opacity of the right upper lobe. Unchanged left basilar pleural thickening. The visualized skeletal structures are unremarkable. IMPRESSION: Interval resolution of previously seen heterogeneous airspace opacity of the right upper lobe, consistent with resolution of infection.  CTA Chest 02/12/20: IMPRESSION: 1. No acute pulmonary embolism. 2. Findings concerning for infectious bronchiolitis (viral or bacterial) with areas of consolidation involving the right upper and left lower lobes. 3. Significant reflux of contrast into the IVC, suggestive of right heart dysfunction. - Aortic Atherosclerosis (ICD10-I70.0) and Emphysema (ICD10-J43.9).   EKG: SB at 54 bpm  CV: Echo (TTE, limited) 05/24/20: IMPRESSIONS  1. Left ventricular ejection fraction, by estimation, is 60 to 65%. The  left  ventricle has normal function. The left ventricle has no regional  wall motion abnormalities. Left ventricular diastolic parameters are  consistent with Grade II diastolic  dysfunction (pseudonormalization).  2. Right ventricular systolic function is normal. The right ventricular  size is normal. There is normal pulmonary artery systolic pressure. The  estimated right ventricular systolic pressure is 44.0 mmHg.  3. Left atrial size was mild to moderately dilated.  4. The mitral valve is normal in structure. Mild mitral valve  regurgitation. No evidence of mitral stenosis.  5. The aortic valve is tricuspid. Aortic valve regurgitation is trivial.  No aortic stenosis is present.  6. The inferior vena cava is normal in size with greater than 50%  respiratory variability, suggesting right atrial pressure of 3 mmHg.  - Comparison(s): 01/14/20 EF 30-35%. PA pressure 52mmHg.    Long Term Monitor 7/31/-21-8/14/21: Study Highlights SR/SB average HR 51 bpm < 1% burden PAF PAC's < 1% total beats    TEE 01/17/20: IMPRESSIONS  1. Patient in rapid afib. No LAA thrombus On Eliquis DCC x 1 150J  biphasic converted wit NSR with PaC;s and salvo's of recurrent PACls BP  somewhat low post conversions Rx with one bolus Neo and will start  amiodarone drip.  2. Left ventricular ejection fraction, by estimation, is 30 to 35%. The  left ventricle has moderately decreased function. The left ventricle  demonstrates global hypokinesis. The left ventricular internal cavity size  was moderately dilated. Left  ventricular diastolic parameters are indeterminate.  3. Right ventricular systolic function is moderately reduced. The right  ventricular size is moderately enlarged.  4. Left atrial size was moderately dilated. No left atrial/left atrial  appendage thrombus was detected.  5. Right atrial size was moderately dilated.  6. The mitral valve is normal in structure. Moderate to severe mitral   valve regurgitation. No evidence of mitral stenosis.  7. Tricuspid valve regurgitation is moderate.  8. The aortic valve is tricuspid. Aortic valve regurgitation is trivial.  No aortic stenosis is present.  9. The inferior vena cava is normal in size with greater than 50%  respiratory variability, suggesting right atrial pressure of 3 mmHg.  - Conclusion(s)/Recommendation(s): Normal biventricular function without  evidence of hemodynamically significant valvular heart disease.    Nuclear stress test 01/14/17: IMPRESSION: 1. Low anterior wall scar but no findings for ischemia. 2. Wall motion abnormality in the area of scar but the remainder of the left ventricle appears normal. 3. Left ventricular ejection fraction 54% 4. Non invasive risk stratification*: Low   Past Medical History:  Diagnosis Date  . Allergy   . Arthritis   . Asthma   . Atrial fibrillation ([place])   . Benign essential HTN   . CHF (congestive heart failure) ([place])   . CKD (chronic kidney disease) stage 3, GFR 30-59 ml/min (H[facility])   . HLD (hyperlipidemia)   . HTN (hypertension)   . Pneumonia     Past Surgical History:  Procedure Laterality Date  . ABDOMINAL HYSTERECTOMY  10/04/1995   Hysterectomy and Bilateral oophorectomy  . CARDIOVERSION N/A 01/17/2020   Procedure: CARDIOVERSION;  Surgeon: [name], MD;  Location: [facility] ENDOSCOPY;  Service: Cardiovascular;  Laterality: N/A;  . COLON SURGERY  09/2009   hole in colon  . FRACTURE SURGERY Right 08/02/2011   Right Leg--Plates & Screws  . 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 TEE WITHOUT CARDIOVERSION N/A 01/17/2020   Procedure: TRANSESOPHAGEAL ECHOCARDIOGRAM (TEE);  Surgeon: [name], MD;  Location: [facility] ENDOSCOPY;  Service: Cardiovascular;  Laterality: N/A;    MEDICATIONS: . 0.9 %  sodium chloride infusion   . albuterol (VENTOLIN HFA) 108 (90 Base)  MCG/ACT inhaler  . amiodarone (PACERONE) 200 MG tablet  . apixaban (ELIQUIS) 5 MG TABS tablet  . atorvastatin (LIPITOR) 40 MG tablet  . benazepril (LOTENSIN) 20 MG tablet  . Biotin 5000 MCG TABS  . CALCIUM GLUCONATE PO  . carvedilol (COREG) 6.25 MG tablet  . cholecalciferol (VITAMIN D) 1000 units tablet  . fluticasone (FLONASE) 50 MCG/ACT nasal spray  . Fluticasone-Salmeterol (ADVAIR DISKUS) 100-50 MCG/DOSE AEPB  . furosemide (LASIX) 40 MG tablet  . HYDROcodone-acetaminophen (NORCO) 5-325 MG tablet  . omeprazole (PRILOSEC) 20 MG capsule  . polyethylene glycol (MIRALAX / GLYCOLAX) 17 g packet  . traMADol (ULTRAM) 50 MG tablet    Myra Gianotti, PA-C Surgical Short Stay/Anesthesiology [facility] Phone [phone] [facility] Phone [phone] 12/01/2020 3:31 PM

## 2020-12-04 ENCOUNTER — Ambulatory Visit (HOSPITAL_COMMUNITY): Payer: Medicare Other | Admitting: Vascular Surgery

## 2020-12-04 ENCOUNTER — Ambulatory Visit (HOSPITAL_COMMUNITY)
Admission: RE | Admit: 2020-12-04 | Discharge: 2020-12-04 | Disposition: A | Discharge status: Home / Self Care | Payer: Medicare Other | Attending: Orthopaedic Surgery | Admitting: Orthopaedic Surgery

## 2020-12-04 ENCOUNTER — Ambulatory Visit (HOSPITAL_COMMUNITY): Payer: Medicare Other

## 2020-12-04 ENCOUNTER — Other Ambulatory Visit: Payer: Self-pay

## 2020-12-04 ENCOUNTER — Encounter (HOSPITAL_COMMUNITY): Payer: Self-pay | Admitting: Orthopaedic Surgery

## 2020-12-04 ENCOUNTER — Encounter (HOSPITAL_COMMUNITY)
Admission: RE | Disposition: A | Discharge status: Home / Self Care | Payer: Self-pay | Source: Home / Self Care | Attending: Orthopaedic Surgery

## 2020-12-04 DIAGNOSIS — Z7901 Long term (current) use of anticoagulants: Secondary | ICD-10-CM | POA: Insufficient documentation

## 2020-12-04 DIAGNOSIS — J439 Emphysema, unspecified: Secondary | ICD-10-CM | POA: Diagnosis not present

## 2020-12-04 DIAGNOSIS — M1731 Unilateral post-traumatic osteoarthritis, right knee: Secondary | ICD-10-CM | POA: Insufficient documentation

## 2020-12-04 DIAGNOSIS — S82141S Displaced bicondylar fracture of right tibia, sequela: Secondary | ICD-10-CM | POA: Insufficient documentation

## 2020-12-04 DIAGNOSIS — Z87891 Personal history of nicotine dependence: Secondary | ICD-10-CM | POA: Insufficient documentation

## 2020-12-04 DIAGNOSIS — X58XXXS Exposure to other specified factors, sequela: Secondary | ICD-10-CM | POA: Insufficient documentation

## 2020-12-04 DIAGNOSIS — I509 Heart failure, unspecified: Secondary | ICD-10-CM | POA: Diagnosis not present

## 2020-12-04 DIAGNOSIS — Z8051 Family history of malignant neoplasm of kidney: Secondary | ICD-10-CM | POA: Diagnosis not present

## 2020-12-04 DIAGNOSIS — Z419 Encounter for procedure for purposes other than remedying health state, unspecified: Secondary | ICD-10-CM

## 2020-12-04 DIAGNOSIS — Z801 Family history of malignant neoplasm of trachea, bronchus and lung: Secondary | ICD-10-CM | POA: Diagnosis not present

## 2020-12-04 DIAGNOSIS — Z79899 Other long term (current) drug therapy: Secondary | ICD-10-CM | POA: Insufficient documentation

## 2020-12-04 DIAGNOSIS — Z800 Family history of malignant neoplasm of digestive organs: Secondary | ICD-10-CM | POA: Diagnosis not present

## 2020-12-04 DIAGNOSIS — J45909 Unspecified asthma, uncomplicated: Secondary | ICD-10-CM | POA: Diagnosis not present

## 2020-12-04 DIAGNOSIS — Z7951 Long term (current) use of inhaled steroids: Secondary | ICD-10-CM | POA: Insufficient documentation

## 2020-12-04 DIAGNOSIS — Z472 Encounter for removal of internal fixation device: Secondary | ICD-10-CM | POA: Diagnosis not present

## 2020-12-04 DIAGNOSIS — S82141A Displaced bicondylar fracture of right tibia, initial encounter for closed fracture: Secondary | ICD-10-CM | POA: Diagnosis not present

## 2020-12-04 DIAGNOSIS — Z8261 Family history of arthritis: Secondary | ICD-10-CM | POA: Insufficient documentation

## 2020-12-04 DIAGNOSIS — Z833 Family history of diabetes mellitus: Secondary | ICD-10-CM | POA: Diagnosis not present

## 2020-12-04 DIAGNOSIS — J9601 Acute respiratory failure with hypoxia: Secondary | ICD-10-CM | POA: Diagnosis not present

## 2020-12-04 DIAGNOSIS — Z803 Family history of malignant neoplasm of breast: Secondary | ICD-10-CM | POA: Diagnosis not present

## 2020-12-04 DIAGNOSIS — Z825 Family history of asthma and other chronic lower respiratory diseases: Secondary | ICD-10-CM | POA: Diagnosis not present

## 2020-12-04 DIAGNOSIS — I130 Hypertensive heart and chronic kidney disease with heart failure and stage 1 through stage 4 chronic kidney disease, or unspecified chronic kidney disease: Secondary | ICD-10-CM | POA: Diagnosis not present

## 2020-12-04 DIAGNOSIS — N183 Chronic kidney disease, stage 3 unspecified: Secondary | ICD-10-CM | POA: Insufficient documentation

## 2020-12-04 HISTORY — DX: Gastro-esophageal reflux disease without esophagitis: K21.9

## 2020-12-04 HISTORY — DX: Cardiac arrhythmia, unspecified: I49.9

## 2020-12-04 HISTORY — PX: HARDWARE REMOVAL: SHX979

## 2020-12-04 LAB — TYPE AND SCREEN
ABO/RH(D): AB NEG
Antibody Screen: NEGATIVE

## 2020-12-04 LAB — CBC
HCT: 36.6 % (ref 36.0–46.0)
Hemoglobin: 12 g/dL (ref 12.0–15.0)
MCH: 32.5 pg (ref 26.0–34.0)
MCHC: 32.8 g/dL (ref 30.0–36.0)
MCV: 99.2 fL (ref 80.0–100.0)
Platelets: 387 10*3/uL (ref 150–400)
RBC: 3.69 MIL/uL — ABNORMAL LOW (ref 3.87–5.11)
RDW: 13.2 % (ref 11.5–15.5)
WBC: 9 10*3/uL (ref 4.0–10.5)
nRBC: 0 % (ref 0.0–0.2)

## 2020-12-04 LAB — BASIC METABOLIC PANEL
Anion gap: 8 (ref 5–15)
BUN: 13 mg/dL (ref 8–23)
CO2: 27 mmol/L (ref 22–32)
Calcium: 9.1 mg/dL (ref 8.9–10.3)
Chloride: 97 mmol/L — ABNORMAL LOW (ref 98–111)
Creatinine, Ser: 1.05 mg/dL — ABNORMAL HIGH (ref 0.44–1.00)
GFR, Estimated: 57 mL/min — ABNORMAL LOW (ref >60)
Glucose, Bld: 87 mg/dL (ref 70–99)
Potassium: 4 mmol/L (ref 3.5–5.1)
Sodium: 132 mmol/L — ABNORMAL LOW (ref 135–145)

## 2020-12-04 SURGERY — REMOVAL, HARDWARE
Anesthesia: General | Site: Knee | Laterality: Right

## 2020-12-04 MED ORDER — LACTATED RINGERS IV SOLN: INTRAVENOUS | Status: DC

## 2020-12-04 MED ORDER — CHLORHEXIDINE GLUCONATE 0.12 % MT SOLN: 15.0000 mL | Freq: Once | OROMUCOSAL | Status: AC

## 2020-12-04 MED ORDER — FENTANYL CITRATE (PF) 250 MCG/5ML IJ SOLN
INTRAMUSCULAR | Status: DC | PRN
  Administered 2020-12-04: 50 ug via INTRAVENOUS
  Administered 2020-12-04 (×5): 25 ug via INTRAVENOUS

## 2020-12-04 MED ORDER — BUPIVACAINE HCL (PF) 0.25 % IJ SOLN
INTRAMUSCULAR | Status: DC | PRN
  Administered 2020-12-04: 20 mL

## 2020-12-04 MED ORDER — FENTANYL CITRATE (PF) 100 MCG/2ML IJ SOLN
INTRAMUSCULAR | Status: AC
  Administered 2020-12-04: 25 ug via INTRAVENOUS
  Filled 2020-12-04: qty 2

## 2020-12-04 MED ORDER — CEFAZOLIN SODIUM-DEXTROSE 2-4 GM/100ML-% IV SOLN
2.0000 g | INTRAVENOUS | Status: AC
  Administered 2020-12-04: 2 g via INTRAVENOUS
  Filled 2020-12-04: qty 100

## 2020-12-04 MED ORDER — FENTANYL CITRATE (PF) 250 MCG/5ML IJ SOLN
INTRAMUSCULAR | Status: AC
  Filled 2020-12-04: qty 5

## 2020-12-04 MED ORDER — ORAL CARE MOUTH RINSE: 15.0000 mL | Freq: Once | OROMUCOSAL | Status: AC

## 2020-12-04 MED ORDER — ACETAMINOPHEN 10 MG/ML IV SOLN
1000.0000 mg | Freq: Once | INTRAVENOUS | Status: AC
  Administered 2020-12-04: 1000 mg via INTRAVENOUS

## 2020-12-04 MED ORDER — ACETAMINOPHEN 10 MG/ML IV SOLN
INTRAVENOUS | Status: AC
  Filled 2020-12-04: qty 100

## 2020-12-04 MED ORDER — DEXAMETHASONE SODIUM PHOSPHATE 10 MG/ML IJ SOLN
INTRAMUSCULAR | Status: DC | PRN
  Administered 2020-12-04: 5 mg via INTRAVENOUS

## 2020-12-04 MED ORDER — OXYCODONE HCL 5 MG/5ML PO SOLN: 5.0000 mg | Freq: Once | ORAL | Status: AC | PRN

## 2020-12-04 MED ORDER — AMISULPRIDE (ANTIEMETIC) 5 MG/2ML IV SOLN: 10.0000 mg | Freq: Once | INTRAVENOUS | Status: DC | PRN

## 2020-12-04 MED ORDER — HYDROMORPHONE HCL 1 MG/ML IJ SOLN
0.5000 mg | INTRAMUSCULAR | Status: DC | PRN
  Administered 2020-12-04: 0.25 mg via INTRAVENOUS
  Administered 2020-12-04: 0.5 mg via INTRAVENOUS

## 2020-12-04 MED ORDER — OXYCODONE-ACETAMINOPHEN 5-325 MG PO TABS: 1.0000 | ORAL_TABLET | Freq: Three times a day (TID) | ORAL | 0 refills | Status: DC | PRN

## 2020-12-04 MED ORDER — 0.9 % SODIUM CHLORIDE (POUR BTL) OPTIME
TOPICAL | Status: DC | PRN
  Administered 2020-12-04: 1000 mL

## 2020-12-04 MED ORDER — HYDROMORPHONE HCL 1 MG/ML IJ SOLN
INTRAMUSCULAR | Status: AC
  Administered 2020-12-04: 0.25 mg via INTRAVENOUS
  Filled 2020-12-04: qty 1

## 2020-12-04 MED ORDER — LIDOCAINE 2% (20 MG/ML) 5 ML SYRINGE
INTRAMUSCULAR | Status: DC | PRN
  Administered 2020-12-04: 60 mg via INTRAVENOUS

## 2020-12-04 MED ORDER — FENTANYL CITRATE (PF) 100 MCG/2ML IJ SOLN
25.0000 ug | INTRAMUSCULAR | Status: DC | PRN
  Administered 2020-12-04: 50 ug via INTRAVENOUS
  Administered 2020-12-04: 25 ug via INTRAVENOUS

## 2020-12-04 MED ORDER — EPHEDRINE SULFATE-NACL 50-0.9 MG/10ML-% IV SOSY
PREFILLED_SYRINGE | INTRAVENOUS | Status: DC | PRN
  Administered 2020-12-04: 5 mg via INTRAVENOUS

## 2020-12-04 MED ORDER — CHLORHEXIDINE GLUCONATE 0.12 % MT SOLN
OROMUCOSAL | Status: AC
  Administered 2020-12-04: 15 mL via OROMUCOSAL
  Filled 2020-12-04: qty 15

## 2020-12-04 MED ORDER — SODIUM CHLORIDE 0.9 % IR SOLN
Status: DC | PRN
  Administered 2020-12-04: 3000 mL

## 2020-12-04 MED ORDER — MIDAZOLAM HCL 2 MG/2ML IJ SOLN
INTRAMUSCULAR | Status: AC
  Filled 2020-12-04: qty 2

## 2020-12-04 MED ORDER — ONDANSETRON HCL 4 MG/2ML IJ SOLN: 4.0000 mg | Freq: Once | INTRAMUSCULAR | Status: DC | PRN

## 2020-12-04 MED ORDER — VANCOMYCIN HCL 1000 MG IV SOLR
INTRAVENOUS | Status: AC
  Filled 2020-12-04: qty 1000

## 2020-12-04 MED ORDER — OXYCODONE HCL 5 MG PO TABS: 5.0000 mg | ORAL_TABLET | Freq: Once | ORAL | Status: AC | PRN

## 2020-12-04 MED ORDER — BUPIVACAINE HCL (PF) 0.25 % IJ SOLN
INTRAMUSCULAR | Status: AC
  Filled 2020-12-04: qty 30

## 2020-12-04 MED ORDER — ONDANSETRON HCL 4 MG/2ML IJ SOLN
INTRAMUSCULAR | Status: DC | PRN
  Administered 2020-12-04: 4 mg via INTRAVENOUS

## 2020-12-04 MED ORDER — OXYCODONE HCL 5 MG PO TABS
ORAL_TABLET | ORAL | Status: AC
  Administered 2020-12-04: 5 mg via ORAL
  Filled 2020-12-04: qty 1

## 2020-12-04 MED ORDER — MIDAZOLAM HCL 5 MG/5ML IJ SOLN
INTRAMUSCULAR | Status: DC | PRN
  Administered 2020-12-04: 1 mg via INTRAVENOUS

## 2020-12-04 MED ORDER — PROPOFOL 10 MG/ML IV BOLUS
INTRAVENOUS | Status: DC | PRN
  Administered 2020-12-04: 20 mg via INTRAVENOUS
  Administered 2020-12-04: 100 mg via INTRAVENOUS
  Administered 2020-12-04: 30 mg via INTRAVENOUS

## 2020-12-04 MED ORDER — ONDANSETRON HCL 4 MG PO TABS: 4.0000 mg | ORAL_TABLET | Freq: Three times a day (TID) | ORAL | 0 refills | Status: DC | PRN

## 2020-12-04 SURGICAL SUPPLY — 47 items
BNDG COHESIVE 6X5 TAN STRL LF (GAUZE/BANDAGES/DRESSINGS) ×2 IMPLANT
BNDG ELASTIC 4X5.8 VLCR STR LF (GAUZE/BANDAGES/DRESSINGS) ×1 IMPLANT
BNDG ELASTIC 6X5.8 VLCR STR LF (GAUZE/BANDAGES/DRESSINGS) ×1 IMPLANT
BNDG GAUZE ELAST 4 BULKY (GAUZE/BANDAGES/DRESSINGS) ×2 IMPLANT
COVER SURGICAL LIGHT HANDLE (MISCELLANEOUS) ×2 IMPLANT
DRAPE C-ARM 42X72 X-RAY (DRAPES) ×1 IMPLANT
DRAPE C-ARMOR (DRAPES) ×1 IMPLANT
DRAPE U-SHAPE 47X51 STRL (DRAPES) ×2 IMPLANT
DRSG TEGADERM 4X4.75 (GAUZE/BANDAGES/DRESSINGS) ×5 IMPLANT
ELECT CAUTERY BLADE 6.4 (BLADE) ×1 IMPLANT
ELECT REM PT RETURN 9FT ADLT (ELECTROSURGICAL) ×2
ELECTRODE REM PT RTRN 9FT ADLT (ELECTROSURGICAL) ×1 IMPLANT
GAUZE SPONGE 4X4 12PLY STRL (GAUZE/BANDAGES/DRESSINGS) ×3 IMPLANT
GAUZE XEROFORM 5X9 LF (GAUZE/BANDAGES/DRESSINGS) ×1 IMPLANT
GLOVE ECLIPSE 7.0 STRL STRAW (GLOVE) ×2 IMPLANT
GLOVE SKINSENSE NS SZ7.5 (GLOVE) ×2
GLOVE SKINSENSE STRL SZ7.5 (GLOVE) ×2 IMPLANT
GLOVE SURG UNDER POLY LF SZ7 (GLOVE) ×2 IMPLANT
GOWN STRL REIN XL XLG (GOWN DISPOSABLE) ×2 IMPLANT
HANDPIECE INTERPULSE COAX TIP (DISPOSABLE) ×2
KIT BASIN OR (CUSTOM PROCEDURE TRAY) ×2 IMPLANT
KIT TURNOVER KIT B (KITS) ×2 IMPLANT
LOOP VESSEL MAXI BLUE (MISCELLANEOUS) ×1 IMPLANT
NEEDLE 22X1 1/2 (OR ONLY) (NEEDLE) ×1 IMPLANT
NS IRRIG 1000ML POUR BTL (IV SOLUTION) ×2 IMPLANT
PACK ORTHO EXTREMITY (CUSTOM PROCEDURE TRAY) ×2 IMPLANT
PAD ARMBOARD 7.5X6 YLW CONV (MISCELLANEOUS) ×3 IMPLANT
PADDING CAST COTTON 6X4 STRL (CAST SUPPLIES) ×3 IMPLANT
SET HNDPC FAN SPRY TIP SCT (DISPOSABLE) IMPLANT
SPONGE LAP 18X18 RF (DISPOSABLE) ×1 IMPLANT
STAPLER VISISTAT 35W (STAPLE) IMPLANT
STOCKINETTE IMPERVIOUS LG (DRAPES) ×2 IMPLANT
SUCTION FRAZIER HANDLE 10FR (MISCELLANEOUS) ×2
SUCTION TUBE FRAZIER 10FR DISP (MISCELLANEOUS) IMPLANT
SUT ETHILON 3 0 PS 1 (SUTURE) ×4 IMPLANT
SUT VIC AB 0 CT1 27 (SUTURE) ×2
SUT VIC AB 0 CT1 27XBRD ANBCTR (SUTURE) IMPLANT
SUT VIC AB 1 CT1 36 (SUTURE) ×2 IMPLANT
SUT VIC AB 2-0 CT1 27 (SUTURE) ×8
SUT VIC AB 2-0 CT1 TAPERPNT 27 (SUTURE) IMPLANT
SYR CONTROL 10ML LL (SYRINGE) ×1 IMPLANT
TOWEL GREEN STERILE (TOWEL DISPOSABLE) ×4 IMPLANT
TOWEL GREEN STERILE FF (TOWEL DISPOSABLE) ×4 IMPLANT
TUBE CONNECTING 12X1/4 (SUCTIONS) ×2 IMPLANT
UNDERPAD 30X36 HEAVY ABSORB (UNDERPADS AND DIAPERS) ×2 IMPLANT
WATER STERILE IRR 1000ML POUR (IV SOLUTION) ×2 IMPLANT
YANKAUER SUCT BULB TIP NO VENT (SUCTIONS) ×2 IMPLANT

## 2020-12-04 NOTE — Discharge Instructions (Addendum)
   Postoperative instructions:  Weightbearing instructions: as tolerated  Keep your dressing and/or splint clean and dry at all times.  You can remove your dressing on post-operative day #3 and change with a dry/sterile dressing or Band-Aids as needed thereafter.    Incision instructions:  Do not soak your incision for 3 weeks after surgery.  If the incision gets wet, pat dry and do not scrub the incision.  Pain control:  You have been given a prescription to be taken as directed for post-operative pain control.  In addition, elevate the operative extremity above the heart at all times to prevent swelling and throbbing pain.  Take over-the-counter Colace, 100mg  by mouth twice a day while taking narcotic pain medications to help prevent constipation.  Follow up appointments: 1) 14 days for suture removal and wound check. 2) Dr. Erlinda Hong as scheduled.   -------------------------------------------------------------------------------------------------------------  After Surgery Pain Control:  After your surgery, post-surgical discomfort or pain is likely. This discomfort can last several days to a few weeks. At certain times of the day your discomfort may be more intense.  Did you receive a nerve block?  A nerve block can provide pain relief for one hour to two days after your surgery. As long as the nerve block is working, you will experience little or no sensation in the area the surgeon operated on.  As the nerve block wears off, you will begin to experience pain or discomfort. It is very important that you begin taking your prescribed pain medication before the nerve block fully wears off. Treating your pain at the first sign of the block wearing off will ensure your pain is better controlled and more tolerable when full-sensation returns. Do not wait until the pain is intolerable, as the medicine will be less effective. It is better to treat pain in advance than to try and catch up.  General  Anesthesia:  If you did not receive a nerve block during your surgery, you will need to start taking your pain medication shortly after your surgery and should continue to do so as prescribed by your surgeon.  Pain Medication:  Most commonly we prescribe Vicodin and Percocet for post-operative pain. Both of these medications contain a combination of acetaminophen (Tylenol) and a narcotic to help control pain.   It takes between 30 and 45 minutes before pain medication starts to work. It is important to take your medication before your pain level gets too intense.   Nausea is a common side effect of many pain medications. You will want to eat something before taking your pain medicine to help prevent nausea.   If you are taking a prescription pain medication that contains acetaminophen, we recommend that you do not take additional over the counter acetaminophen (Tylenol).  Other pain relieving options:   Using a cold pack to ice the affected area a few times a day (15 to 20 minutes at a time) can help to relieve pain, reduce swelling and bruising.   Elevation of the affected area can also help to reduce pain and swelling.

## 2020-12-04 NOTE — Anesthesia Procedure Notes (Addendum)
Procedure Name: LMA Insertion Date/Time: 12/04/2020 10:18 AM Performed by: Janene Harvey, CRNA Pre-anesthesia Checklist: Patient identified Patient Re-evaluated:Patient Re-evaluated prior to induction Preoxygenation: Pre-oxygenation with 100% oxygen Induction Type: IV induction LMA: LMA inserted LMA Size: 3.0 Number of attempts: 1 Placement Confirmation: positive ETCO2 and breath sounds checked- equal and bilateral Dental Injury: Teeth and Oropharynx as per pre-operative assessment  Comments: LMA placed by [facility]

## 2020-12-04 NOTE — Anesthesia Postprocedure Evaluation (Signed)
Anesthesia Post Note  Patient: ORTENCIA ASKARI  Procedure(s) Performed: rigth knee hardware removal (Right Knee)     Patient location during evaluation: PACU Anesthesia Type: General Level of consciousness: awake and alert Pain management: pain level controlled Vital Signs Assessment: post-procedure vital signs reviewed and stable Respiratory status: spontaneous breathing, nonlabored ventilation and respiratory function stable Cardiovascular status: blood pressure returned to baseline and stable Postop Assessment: no apparent nausea or vomiting Anesthetic complications: no   No complications documented.  Last Vitals:  Vitals:   12/04/20 1340 12/04/20 1350  BP: 140/66   Pulse: (!) 53   Resp: 12   Temp:  36.4 C  SpO2: 95%     Last Pain:  Vitals:   12/04/20 1340  PainSc: 5                  Jandel Patriarca E Huberta Tompkins

## 2020-12-04 NOTE — Op Note (Signed)
   Date of Surgery: 12/04/2020  INDICATIONS: Ms. Ayon is a 70 y.o.-year-old female with post-traumatic arthritis of the  right knee with retained hardware from prior ORIF of a bicondylar tibial plateau fracture.  The patient did consent to the procedure after discussion of the risks and benefits.  PREOPERATIVE DIAGNOSIS:  1.  Post-traumatic arthritis of right knee joint 2.  Status post ORIF of right bicondylar tibial plateau fracture with retained hardware  POSTOPERATIVE DIAGNOSIS: Same.  PROCEDURE:  1.  Removal of lateral tibial plateau plate and associated screws from lateral incision 2.  Removal of medial tibial plateau plate and associated screws from separate medial incision  SURGEON: N. Eduard Roux, M.D.  ASSIST: Ciro Backer [place], [place]; necessary for the timely completion of procedure and due to complexity of procedure.  ANESTHESIA:  general, local  IV FLUIDS AND URINE: See anesthesia.  ESTIMATED BLOOD LOSS: Minimal mL.  EXPLANTS: Lateral proximal tibia plate, medial proximal tibia plate, associated screws  DRAINS: None  COMPLICATIONS: see description of procedure.  DESCRIPTION OF PROCEDURE: The patient was brought to the operating room.  The patient had been signed prior to the procedure and this was documented. The patient had the anesthesia placed by the anesthesiologist.  A time-out was performed to confirm that this was the correct patient, site, side and location. The patient did receive antibiotics prior to the incision and was re-dosed during the procedure as needed at indicated intervals.  A tourniquet was placed.  The patient had the operative extremity prepped and draped in the standard surgical fashion.    We first began with the lateral side of the knee.  The previous surgical scar was incised and extended distally down the leg.  Dissection was carried down through the subcutaneous tissue and previous scar tissue.  The anterior compartment was opened up just  enough to reveal the hardware.  Fluoroscopy was used to localize the location of the plate in each screw.  The screws were backed out by hand without any difficulty.  A Cobb elevator was used to gently elevate the plate off of the proximal tibia.  There was no difficulty doing this either.  The screw holes were sharply debrided using a rondure.  We then turned our attention to the medial side of the knee.  The previous surgical scar was incised partially.  Dissection was carried down through the subcutaneous tissue.  The fascia was incised to reveal the underlying hardware.  After exposure of the hardware, each screw was backed out without difficulty and the plate was then elevated off of the bone using a Cobb.  Final x-rays were taken to confirm complete removal of the hardware.  Both surgical wounds were thoroughly irrigated with pulse lavage.  Layered closure was performed.  Local anesthetic was placed in both incisions.  Sterile dressings were applied.  Patient tolerated procedure well had no any complications.  [name] was necessary for opening, closing, retracting, limb positioning and overall facilitation and timely completion of the procedure.  POSTOPERATIVE PLAN: Patient will be discharged today and follow-up in 2 weeks for suture removal.  Azucena Cecil, MD 11:34 AM

## 2020-12-04 NOTE — Transfer of Care (Signed)
Immediate Anesthesia Transfer of Care Note  Patient: Erin Wong  Procedure(s) Performed: rigth knee hardware removal (Right Knee)  Patient Location: PACU  Anesthesia Type:General  Level of Consciousness: alert , drowsy and patient cooperative  Airway & Oxygen Therapy: Patient Spontanous Breathing and Patient connected to face mask oxygen  Post-op Assessment: Report given to RN and Post -op Vital signs reviewed and stable  Post vital signs: Reviewed  Last Vitals:  Vitals Value Taken Time  BP 127/61 12/04/20 1211  Temp    Pulse 57 12/04/20 1214  Resp 11 12/04/20 1214  SpO2 97 % 12/04/20 1214  Vitals shown include unvalidated device data.  Last Pain:  Vitals:   12/04/20 0912  PainSc: 0-No pain      Patients Stated Pain Goal: 2 (13/24/40 1027)  Complications: No complications documented.

## 2020-12-04 NOTE — H&P (Signed)
PREOPERATIVE H&P  Chief Complaint: right knee hardware  HPI: Erin Wong is a 70 y.o. female who presents for surgical treatment of right knee hardware.  She denies any changes in medical history.  Past Medical History:  Diagnosis Date  . Allergy   . Arthritis   . Asthma   . Atrial fibrillation ([place])   . Benign essential HTN   . CHF (congestive heart failure) ([place])   . CKD (chronic kidney disease) stage 3, GFR 30-59 ml/min (HCC)   . Dysrhythmia     Afib -  had Ablation  . GERD (gastroesophageal reflux disease)   . HLD (hyperlipidemia)   . HTN (hypertension)   . Pneumonia    Past Surgical History:  Procedure Laterality Date  . ABDOMINAL HYSTERECTOMY  10/04/1995   Hysterectomy and Bilateral oophorectomy  . CARDIOVERSION N/A 01/17/2020   Procedure: CARDIOVERSION;  Surgeon: [name], MD;  Location: [facility] ENDOSCOPY;  Service: Cardiovascular;  Laterality: N/A;  . COLON SURGERY  09/2009   hole in colon  . FRACTURE SURGERY Right 08/02/2011   Right Leg--Plates & Screws  . 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 TEE WITHOUT CARDIOVERSION N/A 01/17/2020   Procedure: TRANSESOPHAGEAL ECHOCARDIOGRAM (TEE);  Surgeon: [name], MD;  Location: [facility] ENDOSCOPY;  Service: Cardiovascular;  Laterality: N/A;   Social History   Socioeconomic History  . Marital status: Married    Spouse name: Not on file  . Number of children: 3  . Years of education: Not on file  . Highest education level: Not on file  Occupational History  . Occupation: retired  Tobacco Use  . Smoking status: Former Smoker    Types: Cigarettes    Quit date: 01/10/1991    Years since quitting: 29.9  . Smokeless tobacco: Never Used  Vaping Use  . Vaping Use: Never used  Substance and Sexual Activity  . Alcohol use: Not Currently    Comment: quit in May/2021  . Drug use: No  . Sexual activity: Never  Other  Topics Concern  . Not on file  Social History Narrative  . Not on file   Social Determinants of Health   Financial Resource Strain: Not on file  Food Insecurity: Not on file  Transportation Needs: Not on file  Physical Activity: Not on file  Stress: Not on file  Social Connections: Not on file   Family History  Problem Relation Age of Onset  . Arthritis Mother   . Miscarriages / [place] Mother   . Lung cancer Mother        Had quit smoking for 25 years  . Esophageal cancer Mother   . Alcohol abuse Father   . Alcohol abuse Brother   . Kidney cancer Brother   . Breast cancer Sister   . COPD Brother   . Asthma Maternal Aunt   . Diabetes Maternal Aunt   . Colon cancer Neg Hx   . Inflammatory bowel disease Neg Hx   . Liver disease Neg Hx   . Pancreatic cancer Neg Hx   . Rectal cancer Neg Hx   . Stomach cancer Neg Hx    No Known Allergies Prior to Admission medications   Medication Sig Start Date End Date Taking? Authorizing Provider  albuterol (VENTOLIN HFA) 108 (90 Base) MCG/ACT inhaler Inhale 1 puff into the lungs every 6 (six) hours as needed for wheezing  or shortness of breath. 07/11/20  Yes [name], MD  amiodarone (PACERONE) 200 MG tablet Take 1 tablet (200 mg total) by mouth daily. 04/19/20  Yes Imogene Burn, PA-C  apixaban (ELIQUIS) 5 MG TABS tablet Take 1 tablet (5 mg total) by mouth 2 (two) times daily. 08/03/20  Yes [name], MD  atorvastatin (LIPITOR) 40 MG tablet TAKE 1 TABLET BY MOUTH EVERY DAY Patient taking differently: Take 40 mg by mouth daily. 09/15/20  Yes [name], MD  benazepril (LOTENSIN) 20 MG tablet Take 20 mg by mouth daily. 11/24/20  Yes [provider]  Biotin 5000 MCG TABS Take 500 mcg by mouth daily.   Yes [provider]  CALCIUM GLUCONATE PO Take 600 mg by mouth daily.   Yes [provider]  carvedilol (COREG) 6.25 MG tablet TAKE 1 TABLET BY MOUTH TWICE A DAY Patient taking differently:  Take 6.25 mg by mouth 2 (two) times daily with a meal. 09/26/20  Yes Burtis Junes, NP  cholecalciferol (VITAMIN D) 1000 units tablet Take 1 tablet (1,000 Units total) by mouth daily. Patient taking differently: Take 5,000 Units by mouth daily. 08/05/16  Yes Dena Billet B, PA-C  fluticasone (FLONASE) 50 MCG/ACT nasal spray Place 2 sprays into both nostrils daily. Patient taking differently: Place 2 sprays into both nostrils daily as needed for allergies. 02/10/20  Yes Bates, Crystal A, FNP  Fluticasone-Salmeterol (ADVAIR DISKUS) 100-50 MCG/DOSE AEPB INHALE 1 PUFF INTO THE LUNGS TWICE A DAY Patient taking differently: Inhale 1 puff into the lungs 2 (two) times daily. 06/12/20  Yes [name], MD  furosemide (LASIX) 40 MG tablet Take 1 tablet (40 mg total) by mouth daily. 02/25/20  Yes Sande Rives E, PA-C  HYDROcodone-acetaminophen (NORCO) 5-325 MG tablet Take 1-2 tablets by mouth daily as needed. Patient taking differently: Take 1-2 tablets by mouth daily as needed for severe pain. 11/23/20  Yes Aundra Dubin, PA-C  omeprazole (PRILOSEC) 20 MG capsule TAKE 1 CAPSULE BY MOUTH EVERY DAY Patient taking differently: Take 20 mg by mouth daily. 11/14/20  Yes [name], MD  polyethylene glycol (MIRALAX / GLYCOLAX) 17 g packet Take 17 g by mouth daily.   Yes [provider]  traMADol (ULTRAM) 50 MG tablet Take 1 tablet (50 mg total) by mouth every 8 (eight) hours as needed for moderate pain or severe pain. 11/29/20   Leandrew Koyanagi, MD     Positive ROS: All other systems have been reviewed and were otherwise negative with the exception of those mentioned in the HPI and as above.  Physical Exam: General: Alert, no acute distress Cardiovascular: No pedal edema Respiratory: No cyanosis, no use of accessory musculature GI: abdomen soft Skin: No lesions in the area of chief complaint Neurologic: Sensation intact distally Psychiatric: Patient is competent for consent with normal  mood and affect Lymphatic: no lymphedema  MUSCULOSKELETAL: exam stable  Assessment: right knee hardware  Plan: Plan for Procedure(s): rigth knee hardware removal  The risks benefits and alternatives were discussed with the patient including but not limited to the risks of nonoperative treatment, versus surgical intervention including infection, bleeding, nerve injury,  blood clots, cardiopulmonary complications, morbidity, mortality, among others, and they were willing to proceed.   Preoperative templating of the joint replacement has been completed, documented, and submitted to the Operating Room personnel in order to optimize intra-operative equipment management.   Eduard Roux, MD 12/04/2020 8:51 AM

## 2020-12-05 ENCOUNTER — Encounter (HOSPITAL_COMMUNITY): Payer: Self-pay | Admitting: Orthopaedic Surgery

## 2020-12-11 ENCOUNTER — Other Ambulatory Visit: Payer: Self-pay | Admitting: Physician Assistant

## 2020-12-11 ENCOUNTER — Telehealth: Payer: Self-pay | Admitting: Orthopaedic Surgery

## 2020-12-11 MED ORDER — OXYCODONE-ACETAMINOPHEN 5-325 MG PO TABS: 1.0000 | ORAL_TABLET | Freq: Three times a day (TID) | ORAL | 0 refills | Status: DC | PRN

## 2020-12-11 NOTE — Telephone Encounter (Signed)
Patient called needing Rx refilled Oxycodone. The number to contact patient is  [phone]

## 2020-12-12 ENCOUNTER — Other Ambulatory Visit: Payer: Self-pay

## 2020-12-12 ENCOUNTER — Ambulatory Visit (INDEPENDENT_AMBULATORY_CARE_PROVIDER_SITE_OTHER): Payer: Medicare Other | Admitting: Physician Assistant

## 2020-12-12 DIAGNOSIS — Z9889 Other specified postprocedural states: Secondary | ICD-10-CM

## 2020-12-12 NOTE — Progress Notes (Signed)
Post-Op Visit Note   Patient: Erin Wong           Date of Birth: [birth year]           MRN: [id number] Visit Date: 12/12/2020 PCP: [name], MD   Assessment & Plan:  Chief Complaint:  Chief Complaint  Patient presents with  . Right Knee - Follow-up   Visit Diagnoses:  1. S/P hardware removal     Plan: Patient is a pleasant 70 year old female who comes in today 1 week out right knee hardware removal.  She has been doing well.  She has been taking oxycodone for pain.  She has been dealing with fair amount of calf pain.  No chest pain or shortness of breath.  She has not previously had a DVT or PE.  She does note that her daughter has a history of multiple DVTs.  She is a non-smoker and she is not on oral contraceptive pills.  The patient is currently on Eliquis for A. fib.  Examination of her right lower extremity reveals well-healing surgical incisions with nylon sutures in place.  No evidence of infection or cellulitis.  She does have a moderate amount of swelling to the right lower extremity with associated calf pain.  Positive Homans.  No erythema or warmth.  She is neurovascular intact distally.  Today, her wounds were recovered.  We will go ahead and order a venous Doppler ultrasound right lower extremity to rule out DVT.  She will report to the ED should she develop chest pain or shortness of breath.  She will follow up with [place] in 1 week for suture removal.  Anticipate getting final x-rays at 6 to 8 weeks postop.  Call with concerns or questions in meantime.  Follow-Up Instructions: Return in about 1 week (around 12/19/2020).   Orders:  No orders of the defined types were placed in this encounter.  No orders of the defined types were placed in this encounter.   Imaging: No new imaging  PMFS History: Patient Active Problem List   Diagnosis Date Noted  . Post-traumatic osteoarthritis of right knee 12/04/2020  . Positive colorectal cancer screening using Cologuard  test 07/02/2020  . History of colectomy 07/02/2020  . Colon cancer screening 07/02/2020  . CHF (congestive heart failure) ([place])   . Acute respiratory failure with hypoxia ([place]) 02/12/2020  . Acute bronchitis 02/12/2020  . Acute systolic heart failure ([place]) 01/19/2020  . Atrial fibrillation with rapid ventricular response ([place]) 01/13/2020  . CKD (chronic kidney disease) stage 3, GFR 30-59 ml/min (HCC)   . Benign essential HTN   . HLD (hyperlipidemia)   . Hyperlipidemia 02/12/2017  . AKI (acute kidney injury) ([place])   . Chest pain 01/13/2017  . Chest pressure   . Acute kidney injury superimposed on CKD ([place])   . Osteoporosis 10/18/2015  . Vitamin D deficiency 07/20/2015  . HTN (hypertension) 07/17/2015  . GERD (gastroesophageal reflux disease) 01/12/2015  . Chronic constipation 01/12/2015  . History of colon surgery 01/12/2015  . History of total hysterectomy with bilateral salpingo-oophorectomy (BSO) 01/12/2015  . Allergy   . Asthma    Past Medical History:  Diagnosis Date  . Allergy   . Arthritis   . Asthma   . Atrial fibrillation ([place])   . Benign essential HTN   . CHF (congestive heart failure) ([place])   . CKD (chronic kidney disease) stage 3, GFR 30-59 ml/min (HCC)   . Dysrhythmia     Afib -  had Ablation  . GERD (gastroesophageal reflux disease)   . HLD (hyperlipidemia)   . HTN (hypertension)   . Pneumonia     Family History  Problem Relation Age of Onset  . Arthritis Mother   . Miscarriages / [place] Mother   . Lung cancer Mother        Had quit smoking for 25 years  . Esophageal cancer Mother   . Alcohol abuse Father   . Alcohol abuse Brother   . Kidney cancer Brother   . Breast cancer Sister   . COPD Brother   . Asthma Maternal Aunt   . Diabetes Maternal Aunt   . Colon cancer Neg Hx   . Inflammatory bowel disease Neg Hx   . Liver disease Neg Hx   . Pancreatic cancer Neg Hx   . Rectal cancer Neg Hx   . Stomach cancer Neg Hx     Past Surgical  History:  Procedure Laterality Date  . ABDOMINAL HYSTERECTOMY  10/04/1995   Hysterectomy and Bilateral oophorectomy  . CARDIOVERSION N/A 01/17/2020   Procedure: CARDIOVERSION;  Surgeon: [name], MD;  Location: [facility] ENDOSCOPY;  Service: Cardiovascular;  Laterality: N/A;  . COLON SURGERY  09/2009   hole in colon  . FRACTURE SURGERY Right 08/02/2011   Right Leg--Plates & Screws  . 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 HARDWARE REMOVAL Right 12/04/2020   Procedure: rigth knee hardware removal;  Surgeon: Leandrew Koyanagi, MD;  Location: [place];  Service: Orthopedics;  Laterality: Right;  . 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 TEE WITHOUT CARDIOVERSION N/A 01/17/2020   Procedure: TRANSESOPHAGEAL ECHOCARDIOGRAM (TEE);  Surgeon: [name], MD;  Location: [facility] ENDOSCOPY;  Service: Cardiovascular;  Laterality: N/A;   Social History   Occupational History  . Occupation: retired  Tobacco Use  . Smoking status: Former Smoker    Types: Cigarettes    Quit date: 01/10/1991    Years since quitting: 29.9  . Smokeless tobacco: Never Used  Vaping Use  . Vaping Use: Never used  Substance and Sexual Activity  . Alcohol use: Not Currently    Comment: quit in May/2021  . Drug use: No  . Sexual activity: Never

## 2020-12-13 NOTE — Addendum Note (Signed)
Addended by: Precious Bard on: 12/13/2020 02:25 PM   Modules accepted: Orders

## 2020-12-18 ENCOUNTER — Telehealth: Payer: Self-pay | Admitting: Orthopaedic Surgery

## 2020-12-18 NOTE — Telephone Encounter (Signed)
Please advise 

## 2020-12-18 NOTE — Telephone Encounter (Signed)
Patient called needing Rx refilled Oxycodone. The number to contact patient is [phone]

## 2020-12-19 ENCOUNTER — Other Ambulatory Visit: Payer: Self-pay

## 2020-12-19 ENCOUNTER — Ambulatory Visit (INDEPENDENT_AMBULATORY_CARE_PROVIDER_SITE_OTHER): Payer: Medicare Other | Admitting: Orthopaedic Surgery

## 2020-12-19 DIAGNOSIS — Z9889 Other specified postprocedural states: Secondary | ICD-10-CM

## 2020-12-19 DIAGNOSIS — M1731 Unilateral post-traumatic osteoarthritis, right knee: Secondary | ICD-10-CM

## 2020-12-19 MED ORDER — OXYCODONE-ACETAMINOPHEN 5-325 MG PO TABS: 1.0000 | ORAL_TABLET | Freq: Every day | ORAL | 0 refills | Status: DC | PRN

## 2020-12-19 NOTE — Progress Notes (Signed)
Post-Op Visit Note   Patient: Erin Wong           Date of Birth: 1950-09-08           MRN: [id number] Visit Date: 12/19/2020 PCP: [name], MD   Assessment & Plan:  Chief Complaint:  Chief Complaint  Patient presents with  . Right Knee - Routine Post Op   Visit Diagnoses:  1. S/P hardware removal   2. Post-traumatic osteoarthritis of right knee     Plan:   [place] is 2 weeks status post hardware removal from the right knee for staged total knee replacement.  Overall she is doing well.  No complaints.  Right lower leg exhibits mild to moderate swelling.  Mild ecchymosis.  Neurovascular intact.  Sutures and incision are intact.  We remove the sutures today apply Steri-Strips.  We will have her come back in 6 weeks for another check on her surgical scars.  We talked about the eventual total knee replacement and the risk of peroneal nerve palsy given chronic valgus deformity and mild flexion contracture.  Plan will be for common peroneal nerve decompression at the time of total knee replacement surgery.  We will talk more at length about that when she follows up with [place].  Follow-Up Instructions: Return in about 6 weeks (around 01/30/2021).   Orders:  No orders of the defined types were placed in this encounter.  No orders of the defined types were placed in this encounter.   Imaging: No results found.  PMFS History: Patient Active Problem List   Diagnosis Date Noted  . Post-traumatic osteoarthritis of right knee 12/04/2020  . Positive colorectal cancer screening using Cologuard test 07/02/2020  . History of colectomy 07/02/2020  . Colon cancer screening 07/02/2020  . CHF (congestive heart failure) ([place])   . Acute respiratory failure with hypoxia ([place]) 02/12/2020  . Acute bronchitis 02/12/2020  . Acute systolic heart failure ([place]) 01/19/2020  . Atrial fibrillation with rapid ventricular response ([place]) 01/13/2020  . CKD (chronic kidney disease) stage 3, GFR  30-59 ml/min (HCC)   . Benign essential HTN   . HLD (hyperlipidemia)   . Hyperlipidemia 02/12/2017  . AKI (acute kidney injury) ([place])   . Chest pain 01/13/2017  . Chest pressure   . Acute kidney injury superimposed on CKD ([place])   . Osteoporosis 10/18/2015  . Vitamin D deficiency 07/20/2015  . HTN (hypertension) 07/17/2015  . GERD (gastroesophageal reflux disease) 01/12/2015  . Chronic constipation 01/12/2015  . History of colon surgery 01/12/2015  . History of total hysterectomy with bilateral salpingo-oophorectomy (BSO) 01/12/2015  . Allergy   . Asthma    Past Medical History:  Diagnosis Date  . Allergy   . Arthritis   . Asthma   . Atrial fibrillation ([place])   . Benign essential HTN   . CHF (congestive heart failure) ([place])   . CKD (chronic kidney disease) stage 3, GFR 30-59 ml/min (HCC)   . Dysrhythmia     Afib -  had Ablation  . GERD (gastroesophageal reflux disease)   . HLD (hyperlipidemia)   . HTN (hypertension)   . Pneumonia     Family History  Problem Relation Age of Onset  . Arthritis Mother   . Miscarriages / [place] Mother   . Lung cancer Mother        Had quit smoking for 25 years  . Esophageal cancer Mother   . Alcohol abuse Father   . Alcohol abuse Brother   .  Kidney cancer Brother   . Breast cancer Sister   . COPD Brother   . Asthma Maternal Aunt   . Diabetes Maternal Aunt   . Colon cancer Neg Hx   . Inflammatory bowel disease Neg Hx   . Liver disease Neg Hx   . Pancreatic cancer Neg Hx   . Rectal cancer Neg Hx   . Stomach cancer Neg Hx     Past Surgical History:  Procedure Laterality Date  . ABDOMINAL HYSTERECTOMY  10/04/1995   Hysterectomy and Bilateral oophorectomy  . CARDIOVERSION N/A 01/17/2020   Procedure: CARDIOVERSION;  Surgeon: [name], MD;  Location: [facility] ENDOSCOPY;  Service: Cardiovascular;  Laterality: N/A;  . COLON SURGERY  09/2009   hole in colon  . FRACTURE SURGERY Right 08/02/2011   Right Leg--Plates & Screws  . 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 HARDWARE REMOVAL Right 12/04/2020   Procedure: rigth knee hardware removal;  Surgeon: Leandrew Koyanagi, MD;  Location: [place];  Service: Orthopedics;  Laterality: Right;  . 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 TEE WITHOUT CARDIOVERSION N/A 01/17/2020   Procedure: TRANSESOPHAGEAL ECHOCARDIOGRAM (TEE);  Surgeon: [name], MD;  Location: [facility] ENDOSCOPY;  Service: Cardiovascular;  Laterality: N/A;   Social History   Occupational History  . Occupation: retired  Tobacco Use  . Smoking status: Former Smoker    Types: Cigarettes    Quit date: 01/10/1991    Years since quitting: 29.9  . Smokeless tobacco: Never Used  Vaping Use  . Vaping Use: Never used  Substance and Sexual Activity  . Alcohol use: Not Currently    Comment: quit in May/2021  . Drug use: No  . Sexual activity: Never

## 2020-12-19 NOTE — Telephone Encounter (Signed)
done

## 2020-12-19 NOTE — Telephone Encounter (Signed)
Lvm

## 2020-12-28 ENCOUNTER — Telehealth: Payer: Self-pay | Admitting: Orthopaedic Surgery

## 2020-12-28 NOTE — Telephone Encounter (Signed)
Patient called requesting a refill of oxycodone. Please send to pharmacy on file. Please call patient at [phone].

## 2020-12-29 ENCOUNTER — Other Ambulatory Visit: Payer: Self-pay | Admitting: Physician Assistant

## 2020-12-29 MED ORDER — HYDROCODONE-ACETAMINOPHEN 5-325 MG PO TABS: 1.0000 | ORAL_TABLET | Freq: Every day | ORAL | 0 refills | Status: DC | PRN

## 2020-12-29 MED ORDER — METHOCARBAMOL 500 MG PO TABS: 500.0000 mg | ORAL_TABLET | Freq: Two times a day (BID) | ORAL | 0 refills | Status: DC | PRN

## 2020-12-29 NOTE — Telephone Encounter (Signed)
Sent in robaxin

## 2020-12-29 NOTE — Telephone Encounter (Signed)
I called pt and she was ok with this. She is saying she is having muscle spasms. Do you want to put her on a muscle relaxer? She said this is why she is still needing pain meds.

## 2020-12-29 NOTE — Telephone Encounter (Signed)
Pt was called and informed, she will try it. She also has tramadol and said she will start taking that instead of the hydrocodone as well. She doesn't feel she needs anything stronger.

## 2020-12-29 NOTE — Telephone Encounter (Signed)
Sent in norco as we need to wean off of narcotics

## 2021-01-01 ENCOUNTER — Encounter: Payer: Self-pay | Admitting: Nurse Practitioner

## 2021-01-01 ENCOUNTER — Ambulatory Visit (INDEPENDENT_AMBULATORY_CARE_PROVIDER_SITE_OTHER): Payer: Medicare Other | Admitting: Nurse Practitioner

## 2021-01-01 ENCOUNTER — Other Ambulatory Visit: Payer: Self-pay

## 2021-01-01 VITALS — BP 110/60 | HR 64 | Temp 97.7°F | Ht 64.0 in | Wt 146.0 lb

## 2021-01-01 DIAGNOSIS — J4520 Mild intermittent asthma, uncomplicated: Secondary | ICD-10-CM | POA: Diagnosis not present

## 2021-01-01 DIAGNOSIS — M25561 Pain in right knee: Secondary | ICD-10-CM

## 2021-01-01 DIAGNOSIS — G8929 Other chronic pain: Secondary | ICD-10-CM | POA: Diagnosis not present

## 2021-01-01 DIAGNOSIS — Z0000 Encounter for general adult medical examination without abnormal findings: Secondary | ICD-10-CM

## 2021-01-01 DIAGNOSIS — Z0001 Encounter for general adult medical examination with abnormal findings: Secondary | ICD-10-CM

## 2021-01-01 DIAGNOSIS — Z1231 Encounter for screening mammogram for malignant neoplasm of breast: Secondary | ICD-10-CM

## 2021-01-01 DIAGNOSIS — J302 Other seasonal allergic rhinitis: Secondary | ICD-10-CM | POA: Diagnosis not present

## 2021-01-01 DIAGNOSIS — T7840XS Allergy, unspecified, sequela: Secondary | ICD-10-CM

## 2021-01-01 MED ORDER — FLUTICASONE PROPIONATE 50 MCG/ACT NA SUSP: 2.0000 | Freq: Every day | NASAL | 3 refills | Status: AC | PRN

## 2021-01-01 MED ORDER — FLUTICASONE-SALMETEROL 100-50 MCG/ACT IN AEPB: 1.0000 | INHALATION_SPRAY | Freq: Two times a day (BID) | RESPIRATORY_TRACT | 3 refills | Status: DC

## 2021-01-01 MED ORDER — ALBUTEROL SULFATE HFA 108 (90 BASE) MCG/ACT IN AERS: 1.0000 | INHALATION_SPRAY | Freq: Four times a day (QID) | RESPIRATORY_TRACT | 3 refills | Status: DC | PRN

## 2021-01-01 MED ORDER — CHOLECALCIFEROL 25 MCG (1000 UT) PO TABS: 1000.0000 [IU] | ORAL_TABLET | Freq: Every day | ORAL | 1 refills | Status: AC

## 2021-01-01 NOTE — Progress Notes (Signed)
Patient: Erin Wong, Female    DOB: [birth year], 70 y.o.   MRN: [id number]  Visit Date: 01/01/2021  Today's Provider: Eulogio Bear, NP   Chief Complaint  Patient presents with  . Medicare Wellness    No medical concerns, does not have advance care directive, given information    Subjective:   Erin Wong is a 70 y.o. female who presents today for her Subsequent Annual Wellness Visit.  Care team: Primary care - [facility] - [facility] - [place] [facility], [place]  HPI  Asthma - using albuterol only once every few months, no worsening nocturnal symptoms.  Uses maintenance inhaler daily.  Allergic rhinitis - Benadryl as needed for rash, takes flonase daily.  Symptoms are well controlled.  Knee hardware removed - plan to have total knee in June   Review of Systems  Constitutional: Negative.   HENT: Negative.   Eyes: Negative.   Respiratory: Negative.   Cardiovascular: Negative.   Gastrointestinal: Negative.   Genitourinary: Negative.   Musculoskeletal: Positive for arthralgias and gait problem. Negative for myalgias.  Hematological: Negative.   Psychiatric/Behavioral: Negative.     Past Medical History:  Diagnosis Date  . Allergy   . Arthritis   . Asthma   . Atrial fibrillation ([place])   . Benign essential HTN   . CHF (congestive heart failure) ([place])   . CKD (chronic kidney disease) stage 3, GFR 30-59 ml/min (HCC)   . Dysrhythmia     Afib -  had Ablation  . GERD (gastroesophageal reflux disease)   . HLD (hyperlipidemia)   . HTN (hypertension)   . Pneumonia     Past Surgical History:  Procedure Laterality Date  . ABDOMINAL HYSTERECTOMY  10/04/1995   Hysterectomy and Bilateral oophorectomy  . CARDIOVERSION N/A 01/17/2020   Procedure: CARDIOVERSION;  Surgeon: [name], MD;  Location: [facility] ENDOSCOPY;  Service: Cardiovascular;  Laterality: N/A;  . COLON SURGERY  09/2009   hole in colon   . FRACTURE SURGERY Right 08/02/2011   Right Leg--Plates & Screws  . 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 HARDWARE REMOVAL Right 12/04/2020   Procedure: rigth knee hardware removal;  Surgeon: Leandrew Koyanagi, MD;  Location: [place];  Service: Orthopedics;  Laterality: Right;  . 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 TEE WITHOUT CARDIOVERSION N/A 01/17/2020   Procedure: TRANSESOPHAGEAL ECHOCARDIOGRAM (TEE);  Surgeon: [name], MD;  Location: [facility] ENDOSCOPY;  Service: Cardiovascular;  Laterality: N/A;    Family History  Problem Relation Age of Onset  . Arthritis Mother   . Miscarriages / [place] Mother   . Lung cancer Mother        Had quit smoking for 25 years  . Esophageal cancer Mother   . Alcohol abuse Father   . Alcohol abuse Brother   . Kidney cancer Brother   . Breast cancer Sister   . COPD Brother   . Asthma Maternal Aunt   . Diabetes Maternal Aunt   . Colon cancer Neg Hx   . Inflammatory bowel disease Neg Hx   . Liver disease Neg Hx   . Pancreatic cancer Neg Hx   . Rectal cancer Neg Hx   . Stomach cancer Neg Hx     Social History   Socioeconomic History  . Marital status: Married    Spouse name: Not on file  . Number of children: 3  .  Years of education: Not on file  . Highest education level: Not on file  Occupational History  . Occupation: retired  Tobacco Use  . Smoking status: Former Smoker    Types: Cigarettes    Quit date: 01/10/1991    Years since quitting: 29.9  . Smokeless tobacco: Never Used  Vaping Use  . Vaping Use: Never used  Substance and Sexual Activity  . Alcohol use: Not Currently    Comment: quit in May/2021  . Drug use: No  . Sexual activity: Never  Other Topics Concern  . Not on file  Social History Narrative  . Not on file   Social Determinants of Health   Financial Resource Strain: Not on file  Food Insecurity: Not on file  Transportation Needs: Not on file   Physical Activity: Not on file  Stress: Not on file  Social Connections: Not on file  Intimate Partner Violence: Not on file    Outpatient Encounter Medications as of 01/01/2021  Medication Sig  . amiodarone (PACERONE) 200 MG tablet Take 1 tablet (200 mg total) by mouth daily.  Marland Kitchen apixaban (ELIQUIS) 5 MG TABS tablet Take 1 tablet (5 mg total) by mouth 2 (two) times daily.  Marland Kitchen atorvastatin (LIPITOR) 40 MG tablet TAKE 1 TABLET BY MOUTH EVERY DAY (Patient taking differently: Take 40 mg by mouth daily.)  . benazepril (LOTENSIN) 20 MG tablet Take 20 mg by mouth daily.  . Biotin 5000 MCG TABS Take 500 mcg by mouth daily.  Marland Kitchen CALCIUM GLUCONATE PO Take 600 mg by mouth daily.  . carvedilol (COREG) 6.25 MG tablet TAKE 1 TABLET BY MOUTH TWICE A DAY (Patient taking differently: Take 6.25 mg by mouth 2 (two) times daily with a meal.)  . Cholecalciferol 25 MCG (1000 UT) tablet Take 1 tablet (1,000 Units total) by mouth daily.  . fluticasone-salmeterol (ADVAIR) 100-50 MCG/ACT AEPB Inhale 1 puff into the lungs 2 (two) times daily.  . furosemide (LASIX) 40 MG tablet Take 1 tablet (40 mg total) by mouth daily.  Marland Kitchen HYDROcodone-acetaminophen (NORCO) 5-325 MG tablet Take 1-2 tablets by mouth daily as needed.  . methocarbamol (ROBAXIN) 500 MG tablet Take 1 tablet (500 mg total) by mouth 2 (two) times daily as needed.  Marland Kitchen omeprazole (PRILOSEC) 20 MG capsule TAKE 1 CAPSULE BY MOUTH EVERY DAY (Patient taking differently: Take 20 mg by mouth daily.)  . polyethylene glycol (MIRALAX / GLYCOLAX) 17 g packet Take 17 g by mouth daily.  . [DISCONTINUED] albuterol (VENTOLIN HFA) 108 (90 Base) MCG/ACT inhaler Inhale 1 puff into the lungs every 6 (six) hours as needed for wheezing or shortness of breath.  . [DISCONTINUED] cholecalciferol (VITAMIN D) 1000 units tablet Take 1 tablet (1,000 Units total) by mouth daily. (Patient taking differently: Take 5,000 Units by mouth daily.)  . [DISCONTINUED] fluticasone (FLONASE) 50 MCG/ACT  nasal spray Place 2 sprays into both nostrils daily. (Patient taking differently: Place 2 sprays into both nostrils daily as needed for allergies.)  . [DISCONTINUED] Fluticasone-Salmeterol (ADVAIR DISKUS) 100-50 MCG/DOSE AEPB INHALE 1 PUFF INTO THE LUNGS TWICE A DAY (Patient taking differently: Inhale 1 puff into the lungs 2 (two) times daily.)  . [DISCONTINUED] traMADol (ULTRAM) 50 MG tablet Take 1 tablet (50 mg total) by mouth every 8 (eight) hours as needed for moderate pain or severe pain.  Marland Kitchen albuterol (VENTOLIN HFA) 108 (90 Base) MCG/ACT inhaler Inhale 1 puff into the lungs every 6 (six) hours as needed for wheezing or shortness of breath.  . fluticasone (FLONASE)  50 MCG/ACT nasal spray Place 2 sprays into both nostrils daily as needed for allergies.  . [DISCONTINUED] ondansetron (ZOFRAN) 4 MG tablet Take 1-2 tablets (4-8 mg total) by mouth every 8 (eight) hours as needed for nausea or vomiting. (Patient not taking: Reported on 01/01/2021)  . [DISCONTINUED] oxyCODONE-acetaminophen (PERCOCET) 5-325 MG tablet Take 1-2 tablets by mouth daily as needed for severe pain.  . [DISCONTINUED] 0.9 %  sodium chloride infusion    No facility-administered encounter medications on file as of 01/01/2021.    Functional Status Survey: Is the patient deaf or have difficulty hearing?: Yes (R ear) Does the patient have difficulty seeing, even when wearing glasses/contacts?: No Does the patient have difficulty concentrating, remembering, or making decisions?: No Does the patient have difficulty walking or climbing stairs?: Yes (walker) Does the patient have difficulty dressing or bathing?: No Does the patient have difficulty doing errands alone such as visiting a doctor's office or shopping?: No  Fall Risk Assessment Fall Risk  01/01/2021 09/29/2020 05/06/2018 02/18/2018 06/05/2017  Falls in the past year? 0 0 No No No  Number falls in past yr: 0 0 - - -  Injury with Fall? 0 0 - - -  Risk for fall due to : - No Fall  Risks - - -  Follow up - Falls evaluation completed - - -    Depression Screen Depression screen [facility] 2/9 01/01/2021 01/01/2021 05/06/2018 02/18/2018 08/14/2017  Decreased Interest 0 0 0 0 0  Down, Depressed, Hopeless 0 0 0 0 0  PHQ - 2 Score 0 0 0 0 0   6CIT Screen 01/01/2021 01/01/2021 05/06/2018  What Year? 0 points 0 points 0 points  What month? 0 points 0 points 0 points  What time? 0 points 0 points 0 points  Count back from 20 0 points 0 points 0 points  Months in reverse 0 points 0 points 2 points  Repeat phrase 4 points 0 points 2 points  Total Score 4 0 4   Advanced Directives Does patient have a HCPOA?    no If yes, name and contact information:  Does patient have a living will or MOST form?  No; handout given  Objective:   Vitals: BP 110/60   Pulse 64   Temp 97.7 F (36.5 C)   Ht 5\' 4"  (1.626 m)   Wt 146 lb (66.2 kg)   SpO2 99%   BMI 25.06 kg/m  Body mass index is 25.06 kg/m. No exam data present  Physical Exam Vitals and nursing note reviewed.  Constitutional:      General: She is not in acute distress.    Appearance: Normal appearance. She is not toxic-appearing.  HENT:     Head: Normocephalic and atraumatic.     Right Ear: Tympanic membrane, ear canal and external ear normal.     Left Ear: Tympanic membrane, ear canal and external ear normal.  Cardiovascular:     Rate and Rhythm: Normal rate and regular rhythm.  Pulmonary:     Effort: Pulmonary effort is normal. No respiratory distress.     Breath sounds: Normal breath sounds. No wheezing, rhonchi or rales.  Abdominal:     General: Abdomen is flat. Bowel sounds are normal.     Palpations: Abdomen is soft.  Musculoskeletal:     Right lower leg: 2+ Pitting Edema present.     Left lower leg: No edema.  Skin:    General: Skin is warm and dry.     Capillary Refill:  Capillary refill takes less than 2 seconds.     Coloration: Skin is not jaundiced or pale.     Findings: No erythema.  Neurological:     Mental  Status: She is alert and oriented to person, place, and time.  Psychiatric:        Mood and Affect: Mood normal.        Behavior: Behavior normal.        Thought Content: Thought content normal.        Judgment: Judgment normal.     Assessment & Plan:     Annual Wellness Visit  Allergic rhinitis -controlled on daily Flonase.  We will continue this medication-refill given.  Asthma- well-controlled with Advair.  We will continue this and as needed albuterol.  Follow-up if using albuterol more than 2 times weekly.  Chronic knee pain- follows with orthopedic, next visit with them is later this month with plan to have total knee replacement.  Reviewed patient's Family Medical History Reviewed and updated list of patient's medical providers Assessment of cognitive impairment was done Assessed patient's functional ability Established a written schedule for health screening [place] Completed and Reviewed   Immunization History  Administered Date(s) Administered  . Fluad Quad(high Dose 65+) 05/26/2020  . Hepatitis B 11/21/2004, 12/19/2004, 08/13/2005  . Influenza, High Dose Seasonal PF 06/06/2016, 06/03/2017, 08/17/2018, 05/26/2019  . Influenza,inj,Quad PF,6+ Mos 07/17/2015  . PFIZER(Purple Top)SARS-COV-2 Vaccination 11/25/2019, 12/20/2019  . Pneumococcal Conjugate-13 05/06/2018  . Pneumococcal Polysaccharide-23 08/01/2016  . Tdap 04/23/2015    Health Maintenance  Topic Date Due  . MAMMOGRAM  07/13/2020  . COVID-19 Vaccine (3 - Booster for Pfizer series) 01/17/2021 (Originally 06/20/2020)  . INFLUENZA VACCINE  04/02/2021  . COLONOSCOPY (Pts 45-8yrs Insurance coverage will need to be confirmed)  09/13/2023  . TETANUS/TDAP  04/22/2025  . DEXA SCAN  Completed  . Hepatitis C Screening  Completed  . PNA vac Low Risk Adult  Completed  . HPV VACCINES  Aged Out    Discussed health benefits of physical activity, and encouraged her to engage in regular  exercise appropriate for her age and condition.   Meds ordered this encounter  Medications  . fluticasone-salmeterol (ADVAIR) 100-50 MCG/ACT AEPB    Sig: Inhale 1 puff into the lungs 2 (two) times daily.    Dispense:  60 each    Refill:  3    Order Specific Question:   Supervising Provider    Answer:   Jenna Luo T [[id number]]  . fluticasone (FLONASE) 50 MCG/ACT nasal spray    Sig: Place 2 sprays into both nostrils daily as needed for allergies.    Dispense:  16 g    Refill:  3    Order Specific Question:   Supervising Provider    Answer:   Jenna Luo T [[id number]]  . Cholecalciferol 25 MCG (1000 UT) tablet    Sig: Take 1 tablet (1,000 Units total) by mouth daily.    Dispense:  90 tablet    Refill:  1    Order Specific Question:   Supervising Provider    Answer:   Jenna Luo T [[id number]]  . albuterol (VENTOLIN HFA) 108 (90 Base) MCG/ACT inhaler    Sig: Inhale 1 puff into the lungs every 6 (six) hours as needed for wheezing or shortness of breath.    Dispense:  18 g    Refill:  3    Order Specific Question:   Supervising Provider    Answer:  PICKARD, WARREN T [[id number]]    Current Outpatient Medications:  .  amiodarone (PACERONE) 200 MG tablet, Take 1 tablet (200 mg total) by mouth daily., Disp: 90 tablet, Rfl: 3 .  apixaban (ELIQUIS) 5 MG TABS tablet, Take 1 tablet (5 mg total) by mouth 2 (two) times daily., Disp: 60 tablet, Rfl: 11 .  atorvastatin (LIPITOR) 40 MG tablet, TAKE 1 TABLET BY MOUTH EVERY DAY (Patient taking differently: Take 40 mg by mouth daily.), Disp: 90 tablet, Rfl: 1 .  benazepril (LOTENSIN) 20 MG tablet, Take 20 mg by mouth daily., Disp: , Rfl:  .  Biotin 5000 MCG TABS, Take 500 mcg by mouth daily., Disp: , Rfl:  .  CALCIUM GLUCONATE PO, Take 600 mg by mouth daily., Disp: , Rfl:  .  carvedilol (COREG) 6.25 MG tablet, TAKE 1 TABLET BY MOUTH TWICE A DAY (Patient taking differently: Take 6.25 mg by mouth 2 (two) times daily with a meal.), Disp: 60 tablet, Rfl: 3 .   Cholecalciferol 25 MCG (1000 UT) tablet, Take 1 tablet (1,000 Units total) by mouth daily., Disp: 90 tablet, Rfl: 1 .  fluticasone-salmeterol (ADVAIR) 100-50 MCG/ACT AEPB, Inhale 1 puff into the lungs 2 (two) times daily., Disp: 60 each, Rfl: 3 .  furosemide (LASIX) 40 MG tablet, Take 1 tablet (40 mg total) by mouth daily., Disp: 90 tablet, Rfl: 3 .  HYDROcodone-acetaminophen (NORCO) 5-325 MG tablet, Take 1-2 tablets by mouth daily as needed., Disp: 20 tablet, Rfl: 0 .  methocarbamol (ROBAXIN) 500 MG tablet, Take 1 tablet (500 mg total) by mouth 2 (two) times daily as needed., Disp: 20 tablet, Rfl: 0 .  omeprazole (PRILOSEC) 20 MG capsule, TAKE 1 CAPSULE BY MOUTH EVERY DAY (Patient taking differently: Take 20 mg by mouth daily.), Disp: 90 capsule, Rfl: 3 .  polyethylene glycol (MIRALAX / GLYCOLAX) 17 g packet, Take 17 g by mouth daily., Disp: , Rfl:  .  albuterol (VENTOLIN HFA) 108 (90 Base) MCG/ACT inhaler, Inhale 1 puff into the lungs every 6 (six) hours as needed for wheezing or shortness of breath., Disp: 18 g, Rfl: 3 .  fluticasone (FLONASE) 50 MCG/ACT nasal spray, Place 2 sprays into both nostrils daily as needed for allergies., Disp: 16 g, Rfl: 3 Medications Discontinued During This Encounter  Medication Reason  . ondansetron (ZOFRAN) 4 MG tablet Error  . oxyCODONE-acetaminophen (PERCOCET) 5-325 MG tablet Error  . 0.9 %  sodium chloride infusion   . traMADol (ULTRAM) 50 MG tablet Completed Course  . Fluticasone-Salmeterol (ADVAIR DISKUS) 100-50 MCG/DOSE AEPB Change in therapy  . cholecalciferol (VITAMIN D) 1000 units tablet Change in therapy  . fluticasone (FLONASE) 50 MCG/ACT nasal spray Reorder  . albuterol (VENTOLIN HFA) 108 (90 Base) MCG/ACT inhaler Reorder    Next Medicare Wellness Visit in 12+ months

## 2021-01-12 ENCOUNTER — Other Ambulatory Visit: Payer: Self-pay | Admitting: *Deleted

## 2021-01-12 ENCOUNTER — Telehealth: Payer: Self-pay

## 2021-01-12 ENCOUNTER — Other Ambulatory Visit: Payer: Self-pay | Admitting: Student

## 2021-01-12 ENCOUNTER — Other Ambulatory Visit: Payer: Self-pay | Admitting: Family Medicine

## 2021-01-12 MED ORDER — AMIODARONE HCL 200 MG PO TABS: 200.0000 mg | ORAL_TABLET | Freq: Every day | ORAL | 3 refills | Status: DC

## 2021-01-12 MED ORDER — BENAZEPRIL HCL 20 MG PO TABS: 20.0000 mg | ORAL_TABLET | Freq: Every day | ORAL | 1 refills | Status: DC

## 2021-01-12 NOTE — Progress Notes (Signed)
CARDIOLOGY OFFICE NOTE  Date:  01/12/2021    Erin Wong Date of Birth: 1951/05/10 Medical Record #[id number]  PCP:  [name], MD  Cardiologist:  Gillian Shields    No chief complaint on file.   History of Present Illness: 70 y.o. 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 Some nausea Seen in ER 01/16/21 with nausea and atypical chest pain HR low R/O no acute ECG changes other than bradycardia and troponin negative x 2      Past Medical History:  Diagnosis Date  . Allergy   . Arthritis   . Asthma   . Atrial fibrillation ([place])   . Benign essential HTN   . CHF (congestive heart failure) ([place])   . CKD (chronic kidney disease) stage 3, GFR 30-59 ml/min (HCC)   . Dysrhythmia     Afib -  had Ablation  . GERD (gastroesophageal reflux disease)   . HLD (hyperlipidemia)   . HTN (hypertension)   . Pneumonia     Past Surgical History:  Procedure Laterality Date  . ABDOMINAL HYSTERECTOMY  10/04/1995   Hysterectomy and Bilateral oophorectomy  . CARDIOVERSION N/A 01/17/2020   Procedure: CARDIOVERSION;  Surgeon: [name], MD;  Location: [facility] ENDOSCOPY;  Service: Cardiovascular;  Laterality: N/A;  . COLON SURGERY  09/2009   hole in colon  . FRACTURE SURGERY Right 08/02/2011   Right Leg--Plates & Screws  . 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 HARDWARE  REMOVAL Right 12/04/2020   Procedure: rigth knee hardware removal;  Surgeon: Leandrew Koyanagi, MD;  Location: [place];  Service: Orthopedics;  Laterality: Right;  . 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 TEE WITHOUT CARDIOVERSION N/A 01/17/2020   Procedure: TRANSESOPHAGEAL ECHOCARDIOGRAM (TEE);  Surgeon: [name], MD;  Location: [facility] ENDOSCOPY;  Service: Cardiovascular;  Laterality: N/A;     Medications: No outpatient medications have been marked as taking for the 01/19/21 encounter (Appointment) with [name], MD.     Allergies: No Known Allergies  Social History: The patient  reports that she quit smoking about 30 years ago. Her smoking use included cigarettes. She has never used smokeless tobacco. She reports previous alcohol use. She reports that she does not use drugs.   Family History: The patient's family history includes Alcohol abuse in her brother and father; Arthritis in her mother; Asthma in her maternal aunt; Breast cancer in her sister; COPD in her brother; Diabetes in her maternal aunt; Esophageal cancer in her mother; Kidney cancer in her brother; Lung cancer in her mother; Miscarriages / Stillbirths in her mother.   Review of Systems: Please see the history of present illness.   All other systems are reviewed and negative.   Physical Exam: VS:  There were no vitals taken for this visit. Marland Kitchen  BMI There is  no height or weight on file to calculate BMI.  Wt Readings from Last 3 Encounters:  01/01/21 66.2 kg  12/04/20 65.3 kg  10/31/20 65.3 kg    Affect appropriate Healthy:  appears stated age 38: normal Neck supple with no adenopathy JVP normal no bruits no thyromegaly Lungs clear with no wheezing and good diaphragmatic motion Heart:  S1/S2 no murmur, no rub, gallop or click PMI normal Abdomen: benighn, BS positve, no tenderness, no AAA no bruit.  No HSM or HJR Distal pulses intact with no bruits No edema Neuro non-focal Skin warm and  dry Post right knee surgery  .   LABORATORY DATA:  EKG:    SB rate 54 06/27/20    Lab Results  Component Value Date   WBC 9.0 12/04/2020   HGB 12.0 12/04/2020   HCT 36.6 12/04/2020   PLT 387 12/04/2020   GLUCOSE 87 12/04/2020   CHOL 186 09/06/2020   TRIG 68 09/06/2020   HDL 89 09/06/2020   LDLCALC 84 09/06/2020   ALT 54 (H) 09/06/2020   AST 42 (H) 09/06/2020   NA 132 (L) 12/04/2020   K 4.0 12/04/2020   CL 97 (L) 12/04/2020   CREATININE 1.05 (H) 12/04/2020   BUN 13 12/04/2020   CO2 27 12/04/2020   TSH 2.170 09/06/2020   INR 1.4 (H) 02/13/2020     BNP (last 3 results) Recent Labs    01/13/20 1418 02/12/20 0030  BNP 983.1* 602.0*    ProBNP (last 3 results) No results for input(s): PROBNP in the last 8760 hours.   Other Studies Reviewed Today:  LIMITED ECHO IMPRESSIONS 05/2020   1. Left ventricular ejection fraction, by estimation, is 60 to 65%. The  left ventricle has normal function. The left ventricle has no regional  wall motion abnormalities. Left ventricular diastolic parameters are  consistent with Grade II diastolic  dysfunction (pseudonormalization).   2. Right ventricular systolic function is normal. The right ventricular  size is normal. There is normal pulmonary artery systolic pressure. The  estimated right ventricular systolic pressure is 75.6 mmHg.   3. Left atrial size was mild to moderately dilated.   4. The mitral valve is normal in structure. Mild mitral valve  regurgitation. No evidence of mitral stenosis.   5. The aortic valve is tricuspid. Aortic valve regurgitation is trivial.  No aortic stenosis is present.   6. The inferior vena cava is normal in size with greater than 50%  respiratory variability, suggesting right atrial pressure of 3 mmHg.   Comparison(s): 01/14/20 EF 30-35%. PA pressure 47mmHg.     Monitor Study Highlights 04/2020   SR/SB average HR 51 bpm < 1% burden PAF PAC's < 1% total beats        Echocardiogram  01/14/2020: Impressions:  1. Left ventricular ejection fraction, by estimation, is 30 to 35%. The  left ventricle has moderately decreased function. The left ventricle  demonstrates global hypokinesis. The left ventricular internal cavity size  was mildly dilated. Left ventricular  diastolic parameters are indeterminate. There is akinesis of the left  ventricular, mid-apical inferior wall, anterior wall, inferolateral wall,  anterolateral wall and lateral wall. There is akinesis of the left  ventricular, apical septal wall and apical  segment.   2. Right ventricular systolic function is mildly reduced. The right  ventricular size is mildly enlarged. There is moderately elevated  pulmonary artery systolic pressure. The estimated right ventricular  systolic pressure is 43.3 mmHg.   3. Left atrial size was  severely dilated.   4. The mitral valve is normal in structure. Mild to moderate mitral valve  regurgitation. No evidence of mitral stenosis.   5. Tricuspid valve regurgitation is mild to moderate.   6. The aortic valve is normal in structure. Aortic valve regurgitation is  trivial. No aortic stenosis is present.   7. The inferior vena cava is dilated in size with <50% respiratory  variability, suggesting right atrial pressure of 15 mmHg.          ASSESSMENT & PLAN:     1. Persistent AF - remains in NSR continue low dose amiodarone TSH normal but LFTls mildly elevated 09/06/20 will repeat and order PFTls with DLCO Decrease coreg to 3.125 bid due to relative bradycardia   2. Chronic anticoagulation -no issues holding prior to knee surgery no bleeding issues normal lytes and Hct  3. Chronic systolic HF/NICM - tachy mediated normalized in sinus   4. HTN - Well controlled.  Continue current medications and low sodium Dash type diet.    6. HLD - on statin - LDL 84 acceptable no known vascular dx   Current medicines are reviewed with the patient today.  The patient does not have concerns  regarding medicines other than what has been noted above.  The following changes have been made:  See above.  Labs/ tests ordered today include:    No orders of the defined types were placed in this encounter.  LFTls PFT pre / post bronchodilator  Decrease coreg to 3.125 bid    Disposition:   FU  In  3 months   Signed: Jenkins Rouge, MD  01/12/2021 1:34 PM  [facility] [address] [place], [place]  [address] Phone: [phone] Fax: [phone]

## 2021-01-12 NOTE — Telephone Encounter (Signed)
Refill

## 2021-01-15 ENCOUNTER — Emergency Department (HOSPITAL_COMMUNITY)
Admission: EM | Admit: 2021-01-15 | Discharge: 2021-01-15 | Disposition: A | Discharge status: Home / Self Care | Payer: Medicare Other | Attending: Emergency Medicine | Admitting: Emergency Medicine

## 2021-01-15 ENCOUNTER — Telehealth: Payer: Self-pay | Admitting: Cardiovascular Disease

## 2021-01-15 DIAGNOSIS — R079 Chest pain, unspecified: Secondary | ICD-10-CM | POA: Diagnosis not present

## 2021-01-15 DIAGNOSIS — Z5321 Procedure and treatment not carried out due to patient leaving prior to being seen by health care provider: Secondary | ICD-10-CM | POA: Insufficient documentation

## 2021-01-15 NOTE — ED Notes (Signed)
Pt stated "that she was feeling better and she knew there were people sicker than her here so she was going to leave and follow up with her PCP". Moving pt OTF.

## 2021-01-15 NOTE — Telephone Encounter (Signed)
Pt c/o of Chest Pain: STAT if CP now or developed within 24 hours  1. Are you having CP right now?  Yes  2. Are you experiencing any other symptoms (ex. SOB, nausea, vomiting, sweating)?  Chest is feeling heavy  3. How long have you been experiencing CP? About a week   4. Is your CP continuous or coming and going?  Coming and going  5. Have you taken Nitroglycerin?  No    Pt is calling with concerns her chest is feeling heavy.She states she was told by the nurse Danielle to call if this issue keeps happening.

## 2021-01-15 NOTE — Telephone Encounter (Signed)
Spoke with pt who reports she is having current chest discomfort along with nausea and some lightheadedness.  Pt states CP pain has been intermittent x 1 week. Pt reports current BP of 114/69.  Pt states she has had this to happen before and her O2 sat was low but she does not have a way to measure her saturations.  Pt advised d/t active chest pain and along with other symptoms she should go to ED for further evaluation.  Pt advised to contact EMS if she does not have someone to drive her.  Pt verbalizes understanding and agrees with current plan.

## 2021-01-16 ENCOUNTER — Encounter (HOSPITAL_COMMUNITY): Payer: Self-pay

## 2021-01-16 ENCOUNTER — Other Ambulatory Visit: Payer: Self-pay

## 2021-01-16 ENCOUNTER — Emergency Department (HOSPITAL_COMMUNITY): Payer: Medicare Other

## 2021-01-16 ENCOUNTER — Emergency Department (HOSPITAL_COMMUNITY)
Admission: EM | Admit: 2021-01-16 | Discharge: 2021-01-16 | Disposition: A | Discharge status: Home / Self Care | Payer: Medicare Other | Attending: Emergency Medicine | Admitting: Emergency Medicine

## 2021-01-16 ENCOUNTER — Telehealth: Payer: Self-pay

## 2021-01-16 DIAGNOSIS — I5021 Acute systolic (congestive) heart failure: Secondary | ICD-10-CM | POA: Diagnosis not present

## 2021-01-16 DIAGNOSIS — R42 Dizziness and giddiness: Secondary | ICD-10-CM | POA: Insufficient documentation

## 2021-01-16 DIAGNOSIS — Z85038 Personal history of other malignant neoplasm of large intestine: Secondary | ICD-10-CM | POA: Diagnosis not present

## 2021-01-16 DIAGNOSIS — R110 Nausea: Secondary | ICD-10-CM | POA: Diagnosis not present

## 2021-01-16 DIAGNOSIS — I130 Hypertensive heart and chronic kidney disease with heart failure and stage 1 through stage 4 chronic kidney disease, or unspecified chronic kidney disease: Secondary | ICD-10-CM | POA: Insufficient documentation

## 2021-01-16 DIAGNOSIS — Z7901 Long term (current) use of anticoagulants: Secondary | ICD-10-CM | POA: Diagnosis not present

## 2021-01-16 DIAGNOSIS — N183 Chronic kidney disease, stage 3 unspecified: Secondary | ICD-10-CM | POA: Diagnosis not present

## 2021-01-16 DIAGNOSIS — Z87891 Personal history of nicotine dependence: Secondary | ICD-10-CM | POA: Insufficient documentation

## 2021-01-16 DIAGNOSIS — Z79899 Other long term (current) drug therapy: Secondary | ICD-10-CM | POA: Diagnosis not present

## 2021-01-16 DIAGNOSIS — R079 Chest pain, unspecified: Secondary | ICD-10-CM | POA: Insufficient documentation

## 2021-01-16 DIAGNOSIS — R001 Bradycardia, unspecified: Secondary | ICD-10-CM

## 2021-01-16 DIAGNOSIS — J45909 Unspecified asthma, uncomplicated: Secondary | ICD-10-CM | POA: Insufficient documentation

## 2021-01-16 DIAGNOSIS — Z7951 Long term (current) use of inhaled steroids: Secondary | ICD-10-CM | POA: Insufficient documentation

## 2021-01-16 DIAGNOSIS — R0789 Other chest pain: Secondary | ICD-10-CM | POA: Diagnosis not present

## 2021-01-16 LAB — BASIC METABOLIC PANEL
Anion gap: 9 (ref 5–15)
BUN: 18 mg/dL (ref 8–23)
CO2: 28 mmol/L (ref 22–32)
Calcium: 9.2 mg/dL (ref 8.9–10.3)
Chloride: 98 mmol/L (ref 98–111)
Creatinine, Ser: 1.11 mg/dL — ABNORMAL HIGH (ref 0.44–1.00)
GFR, Estimated: 53 mL/min — ABNORMAL LOW (ref >60)
Glucose, Bld: 101 mg/dL — ABNORMAL HIGH (ref 70–99)
Potassium: 4 mmol/L (ref 3.5–5.1)
Sodium: 135 mmol/L (ref 135–145)

## 2021-01-16 LAB — CBC
HCT: 39 % (ref 36.0–46.0)
Hemoglobin: 12.4 g/dL (ref 12.0–15.0)
MCH: 31.4 pg (ref 26.0–34.0)
MCHC: 31.8 g/dL (ref 30.0–36.0)
MCV: 98.7 fL (ref 80.0–100.0)
Platelets: 407 10*3/uL — ABNORMAL HIGH (ref 150–400)
RBC: 3.95 MIL/uL (ref 3.87–5.11)
RDW: 13.2 % (ref 11.5–15.5)
WBC: 9.8 10*3/uL (ref 4.0–10.5)
nRBC: 0 % (ref 0.0–0.2)

## 2021-01-16 LAB — TROPONIN I (HIGH SENSITIVITY)
Troponin I (High Sensitivity): 12 ng/L (ref <18)
Troponin I (High Sensitivity): 10 ng/L (ref <18)

## 2021-01-16 MED ORDER — AMIODARONE HCL 200 MG PO TABS: 200.0000 mg | ORAL_TABLET | Freq: Every day | ORAL | 2 refills | Status: DC

## 2021-01-16 MED ORDER — CARVEDILOL 6.25 MG PO TABS: 6.2500 mg | ORAL_TABLET | Freq: Two times a day (BID) | ORAL | 2 refills | Status: DC

## 2021-01-16 NOTE — ED Triage Notes (Signed)
Pt reports chest heaviness, nausea and feeling lightheaded x3 days.

## 2021-01-16 NOTE — Addendum Note (Signed)
Addended by: Carter Kitten D on: 01/16/2021 09:41 AM   Modules accepted: Orders

## 2021-01-16 NOTE — Discharge Instructions (Addendum)
Ms. Erin Wong, it was a pleasure taking care of you in the ED.  Your symptoms are likely due to your A. Fib/bradycardia. Work-up for heart attack was negative. We want you to follow-up with your cardiologist later this week for further management of your A. fib and adjustment of your medications.  Return to the ED if your chest pressure or symptoms worsen.

## 2021-01-16 NOTE — Telephone Encounter (Signed)
Pt called to report chest tightness and heaviness on the left side of her chest for one week. Pt denies any other sx. Instructed to go  To the ER . Will follow up with pcp

## 2021-01-16 NOTE — ED Provider Notes (Signed)
[facility] EMERGENCY DEPARTMENT Provider Note   CSN: [id number] Arrival date & time: 01/16/21  [id number]     History Chief Complaint  Patient presents with  . Chest Pain  . Nausea    Erin Wong is a 70 y.o. female with a PMH of paroxysmal A. 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 Patient states she has had "chest pressure" since 1 week ago. It comes and goes and often radiates to her back. The chest discomfort has progressed over the last 3 days with associated lightheadedness and nausea. She denies any vomiting, shortness of breath, palpitations, leg swelling, abdominal pain, blurry vision, or headache.     Past Medical History:  Diagnosis Date  . Allergy   . Arthritis   . Asthma   . Atrial fibrillation ([place])   . Benign essential HTN   . CHF (congestive heart failure) ([place])   . CKD (chronic kidney disease) stage 3, GFR 30-59 ml/min (HCC)   . Dysrhythmia     Afib -  had Ablation  . GERD (gastroesophageal reflux disease)   . HLD (hyperlipidemia)   . HTN (hypertension)   . Pneumonia     Patient Active Problem List   Diagnosis Date Noted  . Post-traumatic osteoarthritis of right knee 12/04/2020  . Positive colorectal cancer screening using Cologuard test 07/02/2020  . History of colectomy 07/02/2020  . Colon cancer screening 07/02/2020  . CHF (congestive heart failure) ([place])   . Acute respiratory failure with hypoxia ([place]) 02/12/2020  . Acute bronchitis 02/12/2020  . Acute systolic heart failure ([place]) 01/19/2020  . Atrial fibrillation with rapid ventricular response ([place]) 01/13/2020  . CKD (chronic kidney disease) stage 3, GFR 30-59 ml/min (HCC)   . Benign essential HTN   . HLD (hyperlipidemia)   . Hyperlipidemia 02/12/2017  . AKI (acute kidney injury) ([place])   . Chest pain 01/13/2017  . Chest pressure    . Acute kidney injury superimposed on CKD ([place])   . Osteoporosis 10/18/2015  . Vitamin D deficiency 07/20/2015  . HTN (hypertension) 07/17/2015  . GERD (gastroesophageal reflux disease) 01/12/2015  . Chronic constipation 01/12/2015  . History of colon surgery 01/12/2015  . History of total hysterectomy with bilateral salpingo-oophorectomy (BSO) 01/12/2015  . Allergy   . Asthma     Past Surgical History:  Procedure Laterality Date  . ABDOMINAL HYSTERECTOMY  10/04/1995   Hysterectomy and Bilateral oophorectomy  . CARDIOVERSION N/A 01/17/2020   Procedure: CARDIOVERSION;  Surgeon: [name], MD;  Location: [facility] ENDOSCOPY;  Service: Cardiovascular;  Laterality: N/A;  . COLON SURGERY  09/2009   hole in colon  . FRACTURE SURGERY Right 08/02/2011   Right Leg--Plates & Screws  . 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 HARDWARE REMOVAL Right 12/04/2020   Procedure: rigth knee hardware removal;  Surgeon: Leandrew Koyanagi, MD;  Location: [place];  Service: Orthopedics;  Laterality: Right;  . 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 TEE WITHOUT CARDIOVERSION N/A 01/17/2020   Procedure: TRANSESOPHAGEAL ECHOCARDIOGRAM (TEE);  Surgeon: [name], MD;  Location: [facility] ENDOSCOPY;  Service: Cardiovascular;  Laterality: N/A;     OB History   No obstetric history on file.     Family History  Problem Relation Age of Onset  . Arthritis Mother   . Miscarriages /  Stillbirths Mother   . Lung cancer Mother        Had quit smoking for 25 years  . Esophageal cancer Mother   . Alcohol abuse Father   . Alcohol abuse Brother   . Kidney cancer Brother   . Breast cancer Sister   . COPD Brother   . Asthma Maternal Aunt   . Diabetes Maternal Aunt   . Colon cancer Neg Hx   . Inflammatory bowel disease Neg Hx   . Liver disease Neg Hx   . Pancreatic cancer Neg Hx   . Rectal cancer Neg Hx   . Stomach cancer Neg Hx     Social History   Tobacco  Use  . Smoking status: Former Smoker    Types: Cigarettes    Quit date: 01/10/1991    Years since quitting: 30.0  . Smokeless tobacco: Never Used  Vaping Use  . Vaping Use: Never used  Substance Use Topics  . Alcohol use: Not Currently    Comment: quit in May/2021  . Drug use: No    Home Medications Prior to Admission medications   Medication Sig Start Date End Date Taking? Authorizing Provider  albuterol (VENTOLIN HFA) 108 (90 Base) MCG/ACT inhaler Inhale 1 puff into the lungs every 6 (six) hours as needed for wheezing or shortness of breath. 01/01/21   Eulogio Bear, NP  amiodarone (PACERONE) 200 MG tablet Take 1 tablet (200 mg total) by mouth daily. 01/16/21   [name], MD  apixaban (ELIQUIS) 5 MG TABS tablet Take 1 tablet (5 mg total) by mouth 2 (two) times daily. 08/03/20   [name], MD  atorvastatin (LIPITOR) 40 MG tablet Take 1 tablet (40 mg total) by mouth daily. 01/12/21   [name], MD  benazepril (LOTENSIN) 20 MG tablet Take 1 tablet (20 mg total) by mouth daily. 01/12/21   [name], MD  Biotin 5000 MCG TABS Take 500 mcg by mouth daily.    [provider]  CALCIUM GLUCONATE PO Take 600 mg by mouth daily.    [provider]  carvedilol (COREG) 6.25 MG tablet Take 1 tablet (6.25 mg total) by mouth 2 (two) times daily. 01/16/21   [name], MD  Cholecalciferol 25 MCG (1000 UT) tablet Take 1 tablet (1,000 Units total) by mouth daily. 01/01/21   Eulogio Bear, NP  fluticasone Asencion Islam) 50 MCG/ACT nasal spray Place 2 sprays into both nostrils daily as needed for allergies. 01/01/21   Eulogio Bear, NP  fluticasone-salmeterol (ADVAIR) 100-50 MCG/ACT AEPB Inhale 1 puff into the lungs 2 (two) times daily. 01/01/21   Eulogio Bear, NP  furosemide (LASIX) 40 MG tablet TAKE 1 TABLET BY MOUTH EVERY DAY 01/12/21   [name], MD  HYDROcodone-acetaminophen (NORCO) 5-325 MG tablet Take 1-2 tablets by mouth daily as needed.  12/29/20   Aundra Dubin, PA-C  methocarbamol (ROBAXIN) 500 MG tablet Take 1 tablet (500 mg total) by mouth 2 (two) times daily as needed. 12/29/20   Aundra Dubin, PA-C  omeprazole (PRILOSEC) 20 MG capsule TAKE 1 CAPSULE BY MOUTH EVERY DAY Patient taking differently: Take 20 mg by mouth daily. 11/14/20   [name], MD  polyethylene glycol (MIRALAX / GLYCOLAX) 17 g packet Take 17 g by mouth daily.    [provider]    Allergies    Patient has no known allergies.  Review of Systems   Review of Systems  Constitutional: Positive  for fatigue. Negative for fever.  Eyes: Negative for visual disturbance.  Respiratory: Negative for shortness of breath.   Cardiovascular: Positive for chest pain. Negative for palpitations and leg swelling.  Gastrointestinal: Positive for nausea. Negative for vomiting.  Skin: Negative for rash.  Neurological: Positive for light-headedness.    Physical Exam Updated Vital Signs BP (!) 171/70 (BP Location: Right Arm)   Pulse (!) 55   Temp 98.5 F (36.9 C) (Oral)   Resp 18   Ht 5\' 4"  (1.626 m)   Wt 64 kg   SpO2 99%   BMI 24.20 kg/m   Physical Exam Constitutional:      Appearance: She is well-developed.  HENT:     Head: Normocephalic and atraumatic.  Cardiovascular:     Rate and Rhythm: Regular rhythm. Bradycardia present.     Heart sounds: Normal heart sounds.  Pulmonary:     Effort: Pulmonary effort is normal. No respiratory distress.     Breath sounds: Normal breath sounds.  Chest:     Chest wall: No tenderness.  Abdominal:     General: Bowel sounds are normal.     Palpations: Abdomen is soft.     Tenderness: There is no abdominal tenderness.  Musculoskeletal:        General: Normal range of motion.  Skin:    General: Skin is warm and dry.  Neurological:     General: No focal deficit present.     Mental Status: She is alert and oriented to person, place, and time.     Motor: No weakness.  Psychiatric:        Mood  and Affect: Mood normal.     ED Results / Procedures / Treatments   Labs (all labs ordered are listed, but only abnormal results are displayed) Labs Reviewed  BASIC METABOLIC PANEL - Abnormal; Notable for the following components:      Result Value   Glucose, Bld 101 (*)    Creatinine, Ser 1.11 (*)    GFR, Estimated 53 (*)    All other components within normal limits  CBC - Abnormal; Notable for the following components:   Platelets 407 (*)    All other components within normal limits  TROPONIN I (HIGH SENSITIVITY)  TROPONIN I (HIGH SENSITIVITY)    EKG EKG Interpretation  Date/Time:  Tuesday Jan 16 2021 10:20:26 EDT Ventricular Rate:  52 PR Interval:  196 QRS Duration: 106 QT Interval:  466 QTC Calculation: 433 R Axis:   -17 Text Interpretation: Sinus bradycardia Incomplete left bundle branch block Borderline ECG Confirmed by Carmin Muskrat [phone]) on 01/16/2021 2:19:26 PM   Radiology DG Chest 2 View  Result Date: 01/16/2021 CLINICAL DATA:  Chest pressure, pain EXAM: CHEST - 2 VIEW COMPARISON:  02/24/2020 FINDINGS: Blunting of the left costophrenic angle, stable since prior study, likely pleural thickening. Heart is normal size. No confluent airspace opacities. No acute bony abnormality. IMPRESSION: No active cardiopulmonary disease. Electronically Signed   By: Rolm Baptise M.D.   On: 01/16/2021 11:34    Procedures Procedures   Medications Ordered in ED Medications - No data to display  ED Course  I have reviewed the triage vital signs and the nursing notes.  Pertinent labs & imaging results that were available during my care of the patient were reviewed by me and considered in my medical decision making (see chart for details).    MDM Rules/Calculators/A&P  70 year old female with paroxysmal A. fib on Eliquis, HTN, combined systolic (recovered EF 97-41%) and diastolic dysfunction, asthma, hyperlipidemia, chronic knee pain and CKD 3 who  presented for evaluation of intermittent chest pressure for the last week that has progressed in the last few days. Chest pressure has currently resolved. EKG showed sinus bradycardia with incomplete LBBB.  Initial troponin of 12 with subsequent troponin of 10.  Patient's presentation likely secondary to A. fib that has spontaneously resolved versus symptomatic bradycardia. Patient has a heart score of 4. Patient has an appointment to see a her cardiologist (Dr. Johnsie Cancel) on Friday so discharging patient home with close follow-up.  Final Clinical Impression(s) / ED Diagnoses Final diagnoses:  Symptomatic bradycardia    Rx / DC Orders ED Discharge Orders    None       Lacinda Axon, MD 01/16/21 1557    Carmin Muskrat, MD 01/20/21 1221

## 2021-01-17 ENCOUNTER — Telehealth: Payer: Self-pay | Admitting: Cardiovascular Disease

## 2021-01-17 ENCOUNTER — Encounter: Payer: Self-pay | Admitting: Cardiovascular Disease

## 2021-01-17 NOTE — Telephone Encounter (Signed)
Pts daughter calling to report her mother is having active CP and has worsened since yesterday. She has gone to the ED for the last 2 days and was sent home on amiodarone and carvedilol. ED note states her symptoms were likely related to her AF. Today she is having constant CP, chest pressure, and SOB. She would like to come into the office today.  I advised her to seek emergency help as the office is not equip to work up active CP. We discussed the importance of seeking emergency help since her symptoms are constant and have worsened today.   Her daughter agrees but is going to keep her appt with Dr. Johnsie Cancel on Friday.

## 2021-01-17 NOTE — Telephone Encounter (Signed)
error 

## 2021-01-17 NOTE — Telephone Encounter (Signed)
Pt c/o of Chest Pain: STAT if CP now or developed within 24 hours  1. Are you having CP right now? yes  2. Are you experiencing any other symptoms: weak, heavy feeling on chest,  Pain in chest, trouble breathing   3. How long have you been experiencing CP? One week   4. Is your CP continuous or coming and going? Continuous   5. Have you taken Nitroglycerin? No  ?     PT daughter states that mom is feeling really bad stating that she feels that she is about to die and it feels as if something is heavy on her chest accompanied by pain, having trouble breathing feels weak, PT is wanting an appt immediately

## 2021-01-19 ENCOUNTER — Other Ambulatory Visit (HOSPITAL_COMMUNITY)
Admission: RE | Admit: 2021-01-19 | Discharge: 2021-01-19 | Disposition: A | Discharge status: Home / Self Care | Payer: Medicare Other | Source: Ambulatory Visit | Attending: Cardiovascular Disease | Admitting: Cardiovascular Disease

## 2021-01-19 ENCOUNTER — Encounter: Payer: Self-pay | Admitting: Cardiovascular Disease

## 2021-01-19 ENCOUNTER — Other Ambulatory Visit: Payer: Self-pay

## 2021-01-19 ENCOUNTER — Ambulatory Visit (INDEPENDENT_AMBULATORY_CARE_PROVIDER_SITE_OTHER): Payer: Medicare Other | Admitting: Cardiovascular Disease

## 2021-01-19 VITALS — BP 100/62 | HR 55 | Ht 64.0 in | Wt 142.0 lb

## 2021-01-19 DIAGNOSIS — Z01812 Encounter for preprocedural laboratory examination: Secondary | ICD-10-CM | POA: Diagnosis not present

## 2021-01-19 DIAGNOSIS — I480 Paroxysmal atrial fibrillation: Secondary | ICD-10-CM | POA: Diagnosis not present

## 2021-01-19 DIAGNOSIS — Z20822 Contact with and (suspected) exposure to covid-19: Secondary | ICD-10-CM | POA: Diagnosis not present

## 2021-01-19 DIAGNOSIS — R001 Bradycardia, unspecified: Secondary | ICD-10-CM

## 2021-01-19 DIAGNOSIS — Z79899 Other long term (current) drug therapy: Secondary | ICD-10-CM

## 2021-01-19 LAB — SARS CORONAVIRUS 2 (TAT 6-24 HRS): SARS Coronavirus 2: NEGATIVE

## 2021-01-19 MED ORDER — CARVEDILOL 3.125 MG PO TABS: 3.1250 mg | ORAL_TABLET | Freq: Two times a day (BID) | ORAL | 3 refills | Status: DC

## 2021-01-19 NOTE — Addendum Note (Signed)
Addended by: Eulis Foster on: 01/19/2021 10:42 AM   Modules accepted: Orders

## 2021-01-19 NOTE — Patient Instructions (Signed)
Medication Instructions:  DECREASE CARVEDILOL  TO 3.125 MG TWICE DAILY  *If you need a refill on your cardiac medications before your next appointment, please call your pharmacy*   Lab Work: TODAY LIVER If you have labs (blood work) drawn today and your tests are completely normal, you will receive your results only by: Marland Kitchen MyChart Message (if you have MyChart) OR . A paper copy in the mail If you have any lab test that is abnormal or we need to change your treatment, we will call you to review the results.   Testing/Procedures: Your physician has recommended that you have a pulmonary function test. Pulmonary Function Tests are a group of tests that measure how well air moves in and out of your lungs.     Follow-Up: At [facility], you and your health needs are our priority.  As part of our continuing mission to provide you with exceptional heart care, we have created designated Provider Care Teams.  These Care Teams include your primary Cardiologist (physician) and Advanced Practice Providers (APPs -  Physician Assistants and Nurse Practitioners) who all work together to provide you with the care you need, when you need it.  We recommend signing up for the patient portal called "MyChart".  Sign up information is provided on this After Visit Summary.  MyChart is used to connect with patients for Virtual Visits (Telemedicine).  Patients are able to view lab/test results, encounter notes, upcoming appointments, etc.  Non-urgent messages can be sent to your provider as well.   To learn more about what you can do with MyChart, go to [email or web address].    Your next appointment:   3 month(s)  The format for your next appointment:   In Person  Provider:   Jenkins Rouge, MD   Other Instructions NONE

## 2021-01-20 LAB — HEPATIC FUNCTION PANEL
ALT: 38 IU/L — ABNORMAL HIGH (ref 0–32)
AST: 26 IU/L (ref 0–40)
Albumin: 4.3 g/dL (ref 3.8–4.8)
Alkaline Phosphatase: 148 IU/L — ABNORMAL HIGH (ref 44–121)
Bilirubin Total: 0.4 mg/dL (ref 0.0–1.2)
Bilirubin, Direct: 0.12 mg/dL (ref 0.00–0.40)
Total Protein: 6.6 g/dL (ref 6.0–8.5)

## 2021-01-22 ENCOUNTER — Ambulatory Visit (INDEPENDENT_AMBULATORY_CARE_PROVIDER_SITE_OTHER): Payer: Medicare Other | Admitting: Internal Medicine

## 2021-01-22 ENCOUNTER — Other Ambulatory Visit: Payer: Self-pay

## 2021-01-22 DIAGNOSIS — Z79899 Other long term (current) drug therapy: Secondary | ICD-10-CM

## 2021-01-22 LAB — PULMONARY FUNCTION TEST
DL/VA % pred: 71 %
DL/VA: 2.99 ml/min/mmHg/L
DLCO cor % pred: 68 %
DLCO cor: 12.76 ml/min/mmHg
DLCO unc % pred: 66 %
DLCO unc: 12.35 ml/min/mmHg
FEF 25-75 Post: 0.79 L/sec
FEF 25-75 Pre: 0.89 L/sec
FEF2575-%Change-Post: -10 %
FEF2575-%Pred-Post: 43 %
FEF2575-%Pred-Pre: 49 %
FEV1-%Change-Post: -2 %
FEV1-%Pred-Post: 78 %
FEV1-%Pred-Pre: 80 %
FEV1-Post: 1.67 L
FEV1-Pre: 1.71 L
FEV1FVC-%Change-Post: 1 %
FEV1FVC-%Pred-Pre: 86 %
FEV6-%Change-Post: -3 %
FEV6-%Pred-Post: 92 %
FEV6-%Pred-Pre: 95 %
FEV6-Post: 2.48 L
FEV6-Pre: 2.57 L
FEV6FVC-%Change-Post: 0 %
FEV6FVC-%Pred-Post: 103 %
FEV6FVC-%Pred-Pre: 103 %
FVC-%Change-Post: -3 %
FVC-%Pred-Post: 89 %
FVC-%Pred-Pre: 92 %
FVC-Post: 2.52 L
FVC-Pre: 2.61 L
Post FEV1/FVC ratio: 66 %
Post FEV6/FVC ratio: 99 %
Pre FEV1/FVC ratio: 66 %
Pre FEV6/FVC Ratio: 98 %
RV % pred: 126 %
RV: 2.67 L
TLC % pred: 109 %
TLC: 5.3 L

## 2021-01-22 MED ORDER — AMIODARONE HCL 200 MG PO TABS: ORAL_TABLET | ORAL | 3 refills | Status: DC

## 2021-01-22 NOTE — Progress Notes (Signed)
PFT done today. 

## 2021-01-22 NOTE — Addendum Note (Signed)
Addended by: Willeen Cass on: 01/22/2021 09:16 AM   Modules accepted: Orders

## 2021-01-26 ENCOUNTER — Ambulatory Visit (HOSPITAL_COMMUNITY)
Admission: RE | Admit: 2021-01-26 | Discharge: 2021-01-26 | Disposition: A | Discharge status: Home / Self Care | Payer: Medicare Other | Source: Ambulatory Visit | Attending: Orthopaedic Surgery | Admitting: Orthopaedic Surgery

## 2021-01-26 ENCOUNTER — Other Ambulatory Visit: Payer: Self-pay

## 2021-01-26 DIAGNOSIS — Z9889 Other specified postprocedural states: Secondary | ICD-10-CM | POA: Insufficient documentation

## 2021-01-26 NOTE — Progress Notes (Signed)
RLE venous duplex has been completed.  Results can be found under chart review under CV PROC. 01/26/2021 1:21 PM Miana Politte RVT, RDMS

## 2021-01-30 ENCOUNTER — Other Ambulatory Visit: Payer: Self-pay

## 2021-01-30 ENCOUNTER — Encounter: Payer: Self-pay | Admitting: Orthopaedic Surgery

## 2021-01-30 ENCOUNTER — Ambulatory Visit (INDEPENDENT_AMBULATORY_CARE_PROVIDER_SITE_OTHER): Payer: Medicare Other

## 2021-01-30 ENCOUNTER — Ambulatory Visit (INDEPENDENT_AMBULATORY_CARE_PROVIDER_SITE_OTHER): Payer: Medicare Other | Admitting: Orthopaedic Surgery

## 2021-01-30 DIAGNOSIS — M1731 Unilateral post-traumatic osteoarthritis, right knee: Secondary | ICD-10-CM

## 2021-01-30 NOTE — Progress Notes (Signed)
Post-Op Visit Note   Patient: Erin Wong           Date of Birth: [birth year]           MRN: [id number] Visit Date: 01/30/2021 PCP: [name], MD   Assessment & Plan:  Chief Complaint:  Chief Complaint  Patient presents with  . Right Knee - Pain, Routine Post Op   Visit Diagnoses:  1. Post-traumatic osteoarthritis of right knee     Plan:   Darra is 8 weeks status post hardware removal from the right knee for planned staged total knee replacement.  She reports no new issues or complaints.  Overall doing well.  Ambulates with a rolling walker.  Continues to have severe pain from posttraumatic arthritis.  The right knee surgical scars are fully healed.  No signs of infection.  Stable severe valgus deformity and 15 degree flexion contracture.  X-rays demonstrate severe valgus deformity with posttraumatic arthritis.  At this point Cruz is ready to be scheduled for total knee replacement in the near future.  Again we talked at length about the risks including but not limited to infection, foot drop, peroneal nerve palsy, stiffness, incomplete relief of pain.  All questions answered.  Follow-Up Instructions: Return for postop visit.   Orders:  Orders Placed This Encounter  Procedures  . XR KNEE 3 VIEW RIGHT   No orders of the defined types were placed in this encounter.   Imaging: XR KNEE 3 VIEW RIGHT  Result Date: 01/30/2021 Severe valgus deformity and posttraumatic arthritis   PMFS History: Patient Active Problem List   Diagnosis Date Noted  . Post-traumatic osteoarthritis of right knee 12/04/2020  . Positive colorectal cancer screening using Cologuard test 07/02/2020  . History of colectomy 07/02/2020  . Colon cancer screening 07/02/2020  . CHF (congestive heart failure) ([place])   . Acute respiratory failure with hypoxia ([place]) 02/12/2020  . Acute bronchitis 02/12/2020  . Acute systolic heart failure ([place]) 01/19/2020  . Atrial fibrillation with rapid  ventricular response ([place]) 01/13/2020  . CKD (chronic kidney disease) stage 3, GFR 30-59 ml/min (HCC)   . Benign essential HTN   . HLD (hyperlipidemia)   . Hyperlipidemia 02/12/2017  . AKI (acute kidney injury) ([place])   . Chest pain 01/13/2017  . Chest pressure   . Acute kidney injury superimposed on CKD ([place])   . Osteoporosis 10/18/2015  . Vitamin D deficiency 07/20/2015  . HTN (hypertension) 07/17/2015  . GERD (gastroesophageal reflux disease) 01/12/2015  . Chronic constipation 01/12/2015  . History of colon surgery 01/12/2015  . History of total hysterectomy with bilateral salpingo-oophorectomy (BSO) 01/12/2015  . Allergy   . Asthma    Past Medical History:  Diagnosis Date  . Allergy   . Arthritis   . Asthma   . Atrial fibrillation ([place])   . Benign essential HTN   . CHF (congestive heart failure) ([place])   . CKD (chronic kidney disease) stage 3, GFR 30-59 ml/min (HCC)   . Dysrhythmia     Afib -  had Ablation  . GERD (gastroesophageal reflux disease)   . HLD (hyperlipidemia)   . HTN (hypertension)   . Pneumonia     Family History  Problem Relation Age of Onset  . Arthritis Mother   . Miscarriages / [place] Mother   . Lung cancer Mother        Had quit smoking for 25 years  . Esophageal cancer Mother   . Alcohol abuse Father   .  Alcohol abuse Brother   . Kidney cancer Brother   . Breast cancer Sister   . COPD Brother   . Asthma Maternal Aunt   . Diabetes Maternal Aunt   . Colon cancer Neg Hx   . Inflammatory bowel disease Neg Hx   . Liver disease Neg Hx   . Pancreatic cancer Neg Hx   . Rectal cancer Neg Hx   . Stomach cancer Neg Hx     Past Surgical History:  Procedure Laterality Date  . ABDOMINAL HYSTERECTOMY  10/04/1995   Hysterectomy and Bilateral oophorectomy  . CARDIOVERSION N/A 01/17/2020   Procedure: CARDIOVERSION;  Surgeon: [name], MD;  Location: [facility] ENDOSCOPY;  Service: Cardiovascular;  Laterality: N/A;  . COLON SURGERY  09/2009   hole in  colon  . FRACTURE SURGERY Right 08/02/2011   Right Leg--Plates & Screws  . 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 HARDWARE REMOVAL Right 12/04/2020   Procedure: rigth knee hardware removal;  Surgeon: Leandrew Koyanagi, MD;  Location: [place];  Service: Orthopedics;  Laterality: Right;  . 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 TEE WITHOUT CARDIOVERSION N/A 01/17/2020   Procedure: TRANSESOPHAGEAL ECHOCARDIOGRAM (TEE);  Surgeon: [name], MD;  Location[facility] ENDOSCOPY;  Service: Cardiovascular;  Laterality: N/A;   Social History   Occupational History  . Occupation: retired  Tobacco Use  . Smoking status: Former Smoker    Types: Cigarettes    Quit date: 01/10/1991    Years since quitting: 30.0  . Smokeless tobacco: Never Used  Vaping Use  . Vaping Use: Never used  Substance and Sexual Activity  . Alcohol use: Not Currently    Comment: quit in May/2021  . Drug use: No  . Sexual activity: Never

## 2021-02-05 ENCOUNTER — Other Ambulatory Visit: Payer: Self-pay

## 2021-02-14 ENCOUNTER — Other Ambulatory Visit: Payer: Self-pay | Admitting: Physician Assistant

## 2021-02-14 MED ORDER — OXYCODONE-ACETAMINOPHEN 5-325 MG PO TABS: 1.0000 | ORAL_TABLET | Freq: Four times a day (QID) | ORAL | 0 refills | Status: DC | PRN

## 2021-02-14 MED ORDER — METHOCARBAMOL 500 MG PO TABS: 500.0000 mg | ORAL_TABLET | Freq: Two times a day (BID) | ORAL | 0 refills | Status: DC | PRN

## 2021-02-14 MED ORDER — ONDANSETRON HCL 4 MG PO TABS: 4.0000 mg | ORAL_TABLET | Freq: Three times a day (TID) | ORAL | 0 refills | Status: DC | PRN

## 2021-02-14 MED ORDER — DOCUSATE SODIUM 100 MG PO CAPS: 100.0000 mg | ORAL_CAPSULE | Freq: Every day | ORAL | 2 refills | Status: AC | PRN

## 2021-02-14 NOTE — Pre-Procedure Instructions (Addendum)
Surgical Instructions    Your procedure is scheduled on June 20th.  Report to [facility]tr[facility] "A" at 1:15 P.M., then check in with the Admitting office.  Call this number if you have problems the morning of surgery:  [phone]   If you have any questions prior to your surgery date call [phone]: Open Monday-Friday 8am-4pm    Remember:  Do not eat after midnight the night before your surgery  You may drink clear liquids until 12:15 P.M. the day of your surgery.   Clear liquids allowed are: Water, Non-Citrus Juices (without pulp), Carbonated Beverages, Clear Tea, Black Coffee Only, and Gatorade Patient Instructions  The night before surgery:  No food after midnight. ONLY clear liquids after midnight  The day of surgery (if you do NOT have diabetes):  Drink ONE (1) Pre-Surgery Clear Ensure by 12:15 P.M. the day of surgery. Drink in one sitting. Do not sip.  This drink was given to you during your hospital  pre-op appointment visit.  Nothing else to drink after completing the  Pre-Surgery Clear Ensure.         If you have questions, please contact your surgeon's office.     Take these medicines the morning of surgery with A SIP OF WATER   albuterol (VENTOLIN HFA) 108 (90 Base)- If needed  fluticasone (FLONASE)- If needed  fluticasone-salmeterol (ADVAIR)   amiodarone (PACERONE)  atorvastatin (LIPITOR)  carvedilol (COREG)   omeprazole (PRILOSEC)   As of today, STOP taking any Aspirin (unless otherwise instructed by your surgeon) Aleve, Naproxen, Ibuprofen, Motrin, Advil, Goody's, BC's, all herbal medications, fish oil, and all vitamins.  Please follow your surgeon's instructions on when to stop taking apixaban (ELIQUIS). If you have not received instructions from your surgeon, please contact his office.           Do not wear jewelry or makeup Do not wear lotions, powders, perfumes, or deodorant. Do not shave 48 hours prior to surgery.   Do not bring  valuables to the hospital. DO Not wear nail polish, gel polish, artificial nails, or any other type of covering on natural nails including finger and toenails. If patients have artificial nails, gel coating, etc. that need to be removed by a nail salon please have this removed prior to surgery or surgery may need to be canceled/delayed if the surgeon/ anesthesia feels like the patient is unable to be adequately monitored.             [facility] is not responsible for any belongings or valuables.  Do NOT Smoke (Tobacco/Vaping) or drink Alcohol 24 hours prior to your procedure If you use a CPAP at night, you may bring all equipment for your overnight stay.   Contacts, glasses, dentures or bridgework may not be worn into surgery, please bring cases for these belongings   For patients admitted to the hospital, discharge time will be determined by your treatment team.   Patients discharged the day of surgery will not be allowed to drive home, and someone needs to stay with them for 24 hours.  ONLY 1 SUPPORT PERSON MAY BE PRESENT WHILE YOU ARE IN SURGERY. IF YOU ARE TO BE ADMITTED ONCE YOU ARE IN YOUR ROOM YOU WILL BE ALLOWED TWO (2) VISITORS.  Minor children may have two parents present. Special consideration for safety and communication needs will be reviewed on a case by case basis.  Special instructions:    Oral Hygiene is also important to reduce your risk of  infection.  Remember - BRUSH YOUR TEETH THE MORNING OF SURGERY WITH YOUR REGULAR TOOTHPASTE   [facility]- Preparing For Surgery  Before surgery, you can play an important role. Because skin is not sterile, your skin needs to be as free of germs as possible. You can reduce the number of germs on your skin by washing with CHG (chlorahexidine gluconate) Soap before surgery.  CHG is an antiseptic cleaner which kills germs and bonds with the skin to continue killing germs even after washing.     Please do not use if you have an allergy to  CHG or antibacterial soaps. If your skin becomes reddened/irritated stop using the CHG.  Do not shave (including legs and underarms) for at least 48 hours prior to first CHG shower. It is OK to shave your face.  Please follow these instructions carefully.     Shower the NIGHT BEFORE SURGERY and the MORNING OF SURGERY with CHG Soap.   If you chose to wash your hair, wash your hair first as usual with your normal shampoo. After you shampoo, rinse your hair and body thoroughly to remove the shampoo.  Then [employer] and genitals (private parts) with your normal soap and rinse thoroughly to remove soap.  After that Use CHG Soap as you would any other liquid soap. You can apply CHG directly to the skin and wash gently with a scrungie or a clean washcloth.   Apply the CHG Soap to your body ONLY FROM THE NECK DOWN.  Do not use on open wounds or open sores. Avoid contact with your eyes, ears, mouth and genitals (private parts). Wash Face and genitals (private parts)  with your normal soap.   Wash thoroughly, paying special attention to the area where your surgery will be performed.  Thoroughly rinse your body with warm water from the neck down.  DO NOT shower/wash with your normal soap after using and rinsing off the CHG Soap.  Pat yourself dry with a CLEAN TOWEL.  Wear CLEAN PAJAMAS to bed the night before surgery  Place CLEAN SHEETS on your bed the night before your surgery  DO NOT SLEEP WITH PETS.   Day of Surgery:  Take a shower with CHG soap. Wear Clean/Comfortable clothing the morning of surgery Do not apply any deodorants/lotions.   Remember to brush your teeth WITH YOUR REGULAR TOOTHPASTE.   Please read over the following fact sheets that you were given.

## 2021-02-15 ENCOUNTER — Other Ambulatory Visit (HOSPITAL_COMMUNITY): Payer: Medicare Other

## 2021-02-15 ENCOUNTER — Telehealth: Payer: Self-pay | Admitting: Orthopaedic Surgery

## 2021-02-15 ENCOUNTER — Encounter (HOSPITAL_COMMUNITY): Payer: Self-pay

## 2021-02-15 ENCOUNTER — Encounter (HOSPITAL_COMMUNITY)
Admission: RE | Admit: 2021-02-15 | Discharge: 2021-02-15 | Disposition: A | Discharge status: Home / Self Care | Payer: Medicare Other | Source: Ambulatory Visit | Attending: Orthopaedic Surgery | Admitting: Orthopaedic Surgery

## 2021-02-15 ENCOUNTER — Other Ambulatory Visit: Payer: Self-pay

## 2021-02-15 DIAGNOSIS — Z79899 Other long term (current) drug therapy: Secondary | ICD-10-CM | POA: Insufficient documentation

## 2021-02-15 DIAGNOSIS — I130 Hypertensive heart and chronic kidney disease with heart failure and stage 1 through stage 4 chronic kidney disease, or unspecified chronic kidney disease: Secondary | ICD-10-CM | POA: Diagnosis not present

## 2021-02-15 DIAGNOSIS — I4891 Unspecified atrial fibrillation: Secondary | ICD-10-CM | POA: Insufficient documentation

## 2021-02-15 DIAGNOSIS — M1711 Unilateral primary osteoarthritis, right knee: Secondary | ICD-10-CM | POA: Insufficient documentation

## 2021-02-15 DIAGNOSIS — Z20822 Contact with and (suspected) exposure to covid-19: Secondary | ICD-10-CM | POA: Diagnosis not present

## 2021-02-15 DIAGNOSIS — Z7901 Long term (current) use of anticoagulants: Secondary | ICD-10-CM | POA: Diagnosis not present

## 2021-02-15 DIAGNOSIS — Z01818 Encounter for other preprocedural examination: Secondary | ICD-10-CM | POA: Diagnosis not present

## 2021-02-15 DIAGNOSIS — N183 Chronic kidney disease, stage 3 unspecified: Secondary | ICD-10-CM | POA: Insufficient documentation

## 2021-02-15 DIAGNOSIS — I5022 Chronic systolic (congestive) heart failure: Secondary | ICD-10-CM | POA: Insufficient documentation

## 2021-02-15 DIAGNOSIS — E785 Hyperlipidemia, unspecified: Secondary | ICD-10-CM | POA: Diagnosis not present

## 2021-02-15 DIAGNOSIS — Z87891 Personal history of nicotine dependence: Secondary | ICD-10-CM | POA: Insufficient documentation

## 2021-02-15 LAB — TYPE AND SCREEN
ABO/RH(D): AB NEG
Antibody Screen: NEGATIVE

## 2021-02-15 LAB — CBC WITH DIFFERENTIAL/PLATELET
Abs Immature Granulocytes: 0.06 10*3/uL (ref 0.00–0.07)
Basophils Absolute: 0.1 10*3/uL (ref 0.0–0.1)
Basophils Relative: 1 %
Eosinophils Absolute: 0.3 10*3/uL (ref 0.0–0.5)
Eosinophils Relative: 3 %
HCT: 39.8 % (ref 36.0–46.0)
Hemoglobin: 13 g/dL (ref 12.0–15.0)
Immature Granulocytes: 1 %
Lymphocytes Relative: 34 %
Lymphs Abs: 3.4 10*3/uL (ref 0.7–4.0)
MCH: 31.8 pg (ref 26.0–34.0)
MCHC: 32.7 g/dL (ref 30.0–36.0)
MCV: 97.3 fL (ref 80.0–100.0)
Monocytes Absolute: 1.3 10*3/uL — ABNORMAL HIGH (ref 0.1–1.0)
Monocytes Relative: 13 %
Neutro Abs: 4.9 10*3/uL (ref 1.7–7.7)
Neutrophils Relative %: 48 %
Platelets: 391 10*3/uL (ref 150–400)
RBC: 4.09 MIL/uL (ref 3.87–5.11)
RDW: 13.2 % (ref 11.5–15.5)
WBC: 9.9 10*3/uL (ref 4.0–10.5)
nRBC: 0 % (ref 0.0–0.2)

## 2021-02-15 LAB — URINALYSIS, ROUTINE W REFLEX MICROSCOPIC
Bilirubin Urine: NEGATIVE
Glucose, UA: NEGATIVE mg/dL
Hgb urine dipstick: NEGATIVE
Ketones, ur: NEGATIVE mg/dL
Leukocytes,Ua: NEGATIVE
Nitrite: NEGATIVE
Protein, ur: NEGATIVE mg/dL
Specific Gravity, Urine: 1.004 — ABNORMAL LOW (ref 1.005–1.030)
pH: 6 (ref 5.0–8.0)

## 2021-02-15 LAB — COMPREHENSIVE METABOLIC PANEL
ALT: 41 U/L (ref 0–44)
AST: 33 U/L (ref 15–41)
Albumin: 3.7 g/dL (ref 3.5–5.0)
Alkaline Phosphatase: 108 U/L (ref 38–126)
Anion gap: 7 (ref 5–15)
BUN: 20 mg/dL (ref 8–23)
CO2: 29 mmol/L (ref 22–32)
Calcium: 9.2 mg/dL (ref 8.9–10.3)
Chloride: 96 mmol/L — ABNORMAL LOW (ref 98–111)
Creatinine, Ser: 1.12 mg/dL — ABNORMAL HIGH (ref 0.44–1.00)
GFR, Estimated: 53 mL/min — ABNORMAL LOW (ref >60)
Glucose, Bld: 97 mg/dL (ref 70–99)
Potassium: 4.2 mmol/L (ref 3.5–5.1)
Sodium: 132 mmol/L — ABNORMAL LOW (ref 135–145)
Total Bilirubin: 0.8 mg/dL (ref 0.3–1.2)
Total Protein: 6.5 g/dL (ref 6.5–8.1)

## 2021-02-15 LAB — SURGICAL PCR SCREEN
MRSA, PCR: NEGATIVE
Staphylococcus aureus: POSITIVE — AB

## 2021-02-15 LAB — SARS CORONAVIRUS 2 (TAT 6-24 HRS): SARS Coronavirus 2: NEGATIVE

## 2021-02-15 LAB — APTT: aPTT: 36 seconds (ref 24–36)

## 2021-02-15 LAB — PROTIME-INR
INR: 1.4 — ABNORMAL HIGH (ref 0.8–1.2)
Prothrombin Time: 17.1 seconds — ABNORMAL HIGH (ref 11.4–15.2)

## 2021-02-15 NOTE — Progress Notes (Signed)
Called and notified office of PT 17.1 result.

## 2021-02-15 NOTE — Telephone Encounter (Signed)
Patient is scheduled for right TKA with Dr. Erlinda Hong  on 02-19-21 @ [facility] 3:15pm.  Caryl Comes from pre-op Labs at [facility] called today at 3:30pm and left voice mail message notifying Dr. Erlinda Hong of the following:   PT 17.1 result.

## 2021-02-15 NOTE — Progress Notes (Addendum)
PCP - Jenna Luo Cardiologist - Jenkins Rouge Pulmonolgist: clinton Young  PPM/ICD - denies   Chest x-ray - n/a EKG - 02/15/21 Stress Test - denies ECHO - 05/24/20 Cardiac Cath - denies  Sleep Study - denies   NO diabetes  As of today, STOP taking any Aspirin (unless otherwise instructed by your surgeon) Aleve, Naproxen, Ibuprofen, Motrin, Advil, Goody's, BC's, all herbal medications, fish oil, and all vitamins.   Please follow your surgeon's instructions on when to stop taking apixaban (ELIQUIS). If you have not received instructions from your surgeon, please contact his office.   ERAS Protcol -yes PRE-SURGERY Ensure or G2- ensure and instructions given  COVID TEST- completed in PAT 02/15/21   Anesthesia review: yes, history of afibb on eliquis with no instructions given by cardiologist on when to stop. Instructed patient to call surgeon/nishan in regards to further instruction. PT 17.1.  Patient denies shortness of breath, fever, cough and chest pain at PAT appointment   All instructions explained to the patient, with a verbal understanding of the material. Patient agrees to go over the instructions while at home for a better understanding. Patient also instructed to self quarantine after being tested for COVID-19. The opportunity to ask questions was provided.

## 2021-02-16 ENCOUNTER — Telehealth: Payer: Self-pay | Admitting: *Deleted

## 2021-02-16 MED ORDER — TRANEXAMIC ACID 1000 MG/10ML IV SOLN
2000.0000 mg | INTRAVENOUS | Status: DC
  Filled 2021-02-16 (×2): qty 20

## 2021-02-16 NOTE — Care Plan (Signed)
[facility] RN Case Manager call to patient prior to her upcoming Right total knee arthroplasty with Dr. Erlinda Hong. She is an Ortho bundle through [facility] and is agreeable to case management. She lives with her husband, who will be assisting after discharge along with her daughter. She has a FWW and rollator at home. She has been provided a CPM for home use by [employer]. Declined 3in1/BSC at this time. HHPT choice provided and referral made to [facility] ([facility]). Will set her up for OPPT to begin here at [facility] at her request for approximately 2 weeks post op. Reviewed all post op care instructions. Will continue to follow for needs.

## 2021-02-16 NOTE — Telephone Encounter (Signed)
Ortho bundle Pre-op call completed. 

## 2021-02-16 NOTE — Progress Notes (Signed)
Anesthesia Chart Review:   Case: [id number] Date/Time: 02/19/21 1503   Procedure: RIGHT TOTAL KNEE ARTHROPLASTY (Right: Knee)   Anesthesia type: Spinal   Pre-op diagnosis: right knee degenerative joint disease   Location: [facility] OR ROOM 04 / [facility]   Surgeons: Leandrew Koyanagi, MD       DISCUSSION: Patient is a 70 year old female scheduled for the above procedure. She is s/p hardware removal right knee 12/04/20 and was felt acceptable risk from a cardiac standpoint for that procedure with permission to hold Eliquis for 3 days prior to surgery (see 11/24/20 note by Almyra Deforest, [place]). I contacted her, and she confirmed that her last dose Eliquis was 02/15/21 PM.  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   Her last cardiology visit was with Dr. Johnsie Cancel on 01/19/21. He reviewed ED visit for atypical chest pain and symptomatic bradycardia. Troponin x2 negative. Dr. Johnsie Cancel decreased her Coreg due to relative bradycardia. PFTs ordered since on amiodarone. 3 month follow-up planned.   02/15/21 presurgical COVID-19 test negative. Anesthesia team to evaluate on the day of surgery.    VS: BP 108/60   Pulse (!) 59   Temp 36.7 C (Oral)   Resp 17   Ht 5\' 4"  (1.626 m)   Wt 64.7 kg   SpO2 98%   BMI 24.48 kg/m    PROVIDERS: [name], MD is PCP.  Established with [facility] on 01/12/15 (previously lived in [place], [place]) Jenkins Rouge, MD is cardiologist   LABS: Labs reviewed: Acceptable for surgery. PT 17.1, INR 1.4, PTT 36. Last Eliquis 02/15/21 PM. Dr. Erlinda Hong is aware of PT/INR results.  (all labs ordered are listed, but only abnormal results are displayed)  Labs Reviewed  SURGICAL PCR SCREEN - Abnormal; Notable for the following components:      Result Value   Staphylococcus aureus POSITIVE (*)    All other components within normal limits  CBC WITH DIFFERENTIAL/PLATELET - Abnormal; Notable for the following components:   Monocytes Absolute 1.3 (*)    All other components within normal limits  COMPREHENSIVE METABOLIC PANEL - Abnormal; Notable for the following components:   Sodium 132 (*)    Chloride 96 (*)    Creatinine, Ser 1.12 (*)    GFR, Estimated 53 (*)    All other components within normal limits  PROTIME-INR - Abnormal; Notable for the following components:   Prothrombin Time 17.1 (*)    INR 1.4 (*)    All other components within normal limits  URINALYSIS, ROUTINE W REFLEX MICROSCOPIC - Abnormal; Notable for the following components:   Color, Urine STRAW (*)    Specific Gravity, Urine 1.004 (*)    All other components within normal limits  SARS CORONAVIRUS 2 (TAT 6-24 HRS)  APTT  TYPE AND SCREEN    OTHER: PFTs 01/22/21: 2.61 (92%), post 2.52 (89%). FEV1 1.71 (80%), post 1.67 (78%), DLCO unc 12.35 (66%), cor 12.76 (68%). Conclusions:  Although there is airway obstruction and a diffusion defect suggesting emphysema,  the absence of overinflation is inconsistent with that diagnosis. Pulmonary Function Diagnosis: Minimal Obstructive Airways Disease Airtrapping Insignificant response to bronchodilator Mild Diffusion Defect   Colonoscopy 09/12/20 (for + Cologuard): Impression: - Hemorrhoids found on digital rectal exam. - The examined portion of the ileum was normal.  - Three 2 to 10 mm polyps in the descending colon, in the transverse colon and in the ascending colon, removed with a cold snare.  Resected and retrieved. - Diverticulosis in the recto-sigmoid colon and in the sigmoid colon. - Normal mucosa in the entire examined colon otherwise. - Non-bleeding non-thrombosed external and internal hemorrhoids. Per Letter by Dr. Rush Landmark, "The polyps removed from your colon were tubular adenomas.Marland KitchenMarland KitchenMarland KitchenAs a result of the size of the largest polyp, I recommend you have a repeat colonoscopy in 3 years to determine if you have developed any new precancerous polyps and to screen for colorectal cancer."      IMAGES: CXR 01/16/21: FINDINGS: Blunting of the left costophrenic angle, stable since prior study, likely pleural thickening. Heart is normal size. No confluent airspace opacities. No acute bony abnormality.  IMPRESSION: No active cardiopulmonary disease.       EKG: 02/15/21: Sinus bradycardia at 51 bpm Incomplete left bundle branch block LOW VOLTAGE Nonspecific ST abnormality Abnormal ECG No significant change since 01/16/2021 Confirmed by Adrian Prows ([id number]) on 02/15/2021 9:52:01 PM    CV: Echo (TTE, limited) 05/24/20: IMPRESSIONS   1. Left ventricular ejection fraction, by estimation, is 60 to 65%. The  left ventricle has normal function. The left ventricle has no regional  wall motion abnormalities. Left ventricular diastolic parameters are  consistent with Grade II diastolic  dysfunction (pseudonormalization).   2. Right ventricular systolic function is normal. The right ventricular  size is normal. There is normal pulmonary artery systolic pressure. The  estimated right ventricular systolic pressure is 38.8 mmHg.   3. Left atrial size was mild to moderately dilated.   4. The mitral valve is normal in structure. Mild mitral valve  regurgitation. No evidence of mitral stenosis.   5. The aortic valve is tricuspid. Aortic valve regurgitation is trivial.  No aortic stenosis is present.   6. The inferior vena cava is normal in size with greater than 50%  respiratory  variability, suggesting right atrial pressure of 3 mmHg.  - Comparison(s): 01/14/20 EF 30-35%. PA pressure 1mmHg.      Long Term Monitor 7/31/-21-8/14/21: Study Highlights SR/SB average HR 51 bpm < 1% burden PAF PAC's < 1% total beats      TEE 01/17/20: IMPRESSIONS   1. Patient in rapid afib. No LAA thrombus On Eliquis DCC x 1 150J  biphasic converted wit NSR with PaC;s and salvo's of recurrent PACls BP  somewhat low post conversions Rx with one bolus Neo and will start  amiodarone drip.   2. Left ventricular ejection fraction, by estimation, is 30 to 35%. The  left ventricle has moderately decreased function. The left ventricle  demonstrates global hypokinesis. The left ventricular internal cavity size  was moderately dilated. Left  ventricular diastolic parameters are indeterminate.   3. Right ventricular systolic function is moderately reduced. The right  ventricular size is moderately enlarged.   4. Left atrial size was moderately dilated. No left atrial/left atrial  appendage thrombus was detected.   5. Right atrial size was moderately dilated.   6. The mitral valve is normal in structure. Moderate to severe mitral  valve regurgitation. No evidence of mitral stenosis.   7. Tricuspid valve regurgitation  is moderate.   8. The aortic valve is tricuspid. Aortic valve regurgitation is trivial.  No aortic stenosis is present.   9. The inferior vena cava is normal in size with greater than 50%  respiratory variability, suggesting right atrial pressure of 3 mmHg.  - Conclusion(s)/Recommendation(s): Normal biventricular function without  evidence of hemodynamically significant valvular heart disease.     Nuclear stress test 01/14/17: IMPRESSION: 1. Low anterior wall scar but no findings for ischemia. 2. Wall motion abnormality in the area of scar but the remainder of the left ventricle appears normal. 3. Left ventricular ejection fraction 54% 4. Non invasive risk  stratification*: Low   Past Medical History:  Diagnosis Date   Allergy    Arthritis    Asthma    Atrial fibrillation (HCC)    Benign essential HTN    CHF (congestive heart failure) (HCC)    CKD (chronic kidney disease) stage 3, GFR 30-59 ml/min (HCC)    Dysrhythmia     Afib -  had Ablation   GERD (gastroesophageal reflux disease)    HLD (hyperlipidemia)    HTN (hypertension)    Pneumonia     Past Surgical History:  Procedure Laterality Date   ABDOMINAL HYSTERECTOMY  10/04/1995   Hysterectomy and Bilateral oophorectomy   CARDIOVERSION N/A 01/17/2020   Procedure: CARDIOVERSION;  Surgeon: [name], MD;  Location: [facility];  Service: Cardiovascular;  Laterality: N/A;   COLON SURGERY  09/2009   hole in colon   FRACTURE SURGERY Right 08/02/2011   Right Leg--Plates & Screws   HAND SURGERY Right 09/02/2006   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--Arthritis   HARDWARE REMOVAL Right 12/04/2020   Procedure: rigth knee hardware removal;  Surgeon: Leandrew Koyanagi, MD;  Location: [facility];  Service: Orthopedics;  Laterality: Right;   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   at time of colon surgery   TEE WITHOUT CARDIOVERSION N/A 01/17/2020   Procedure: TRANSESOPHAGEAL ECHOCARDIOGRAM (TEE);  Surgeon: [name], MD;  [facility] ENDOSCOPY;  Service: Cardiovascular;  Laterality: N/A;    MEDICATIONS:  albuterol (VENTOLIN HFA) 108 (90 Base) MCG/ACT inhaler   amiodarone (PACERONE) 200 MG tablet   apixaban (ELIQUIS) 5 MG TABS tablet   atorvastatin (LIPITOR) 40 MG tablet   benazepril (LOTENSIN) 20 MG tablet   Biotin 5000 MCG TABS   CALCIUM GLUCONATE PO   carvedilol (COREG) 3.125 MG tablet   Cholecalciferol 25 MCG (1000 UT) tablet   docusate sodium (COLACE) 100 MG capsule   fluticasone (FLONASE) 50 MCG/ACT nasal spray   fluticasone-salmeterol (ADVAIR) 100-50 MCG/ACT AEPB   furosemide (LASIX) 40 MG tablet   methocarbamol (ROBAXIN) 500 MG tablet   omeprazole (PRILOSEC) 20 MG capsule   ondansetron  (ZOFRAN) 4 MG tablet   oxyCODONE-acetaminophen (PERCOCET) 5-325 MG tablet   polyethylene glycol (MIRALAX / GLYCOLAX) 17 g packet   No current facility-administered medications for this encounter.    Myra Gianotti, PA-C Surgical Short Stay/Anesthesiology [facility] Phone [phone] [facility] Phone [phone] 02/16/2021 11:15 AM

## 2021-02-16 NOTE — Anesthesia Preprocedure Evaluation (Addendum)
Anesthesia Evaluation  Patient identified by MRN, date of birth, ID band Patient awake    Reviewed: Allergy & Precautions, NPO status , Patient's Chart, lab work & pertinent test results, reviewed documented beta blocker date and time   Airway Mallampati: II  TM Distance: >3 FB Neck ROM: Full    Dental  (+) Caps, Dental Advisory Given, Edentulous Upper   Pulmonary asthma , pneumonia, resolved, former smoker,    Pulmonary exam normal breath sounds clear to auscultation       Cardiovascular hypertension, Pt. on medications and Pt. on home beta blockers +CHF  + dysrhythmias Atrial Fibrillation  Rhythm:Regular Rate:Bradycardia  S/P ablation  EKG 02/15/21 Sinus Bradycardia, incomplete LBBB pattern  Echo 05/24/20 1. Left ventricular ejection fraction, by estimation, is 60 to 65%. The left ventricle has normal function. The left ventricle has no regional wall motion abnormalities. Left ventricular diastolic parameters are consistent with Grade II diastolic dysfunction (pseudonormalization).  2. Right ventricular systolic function is normal. The right ventricular size is normal. There is normal pulmonary artery systolic pressure. The estimated right ventricular systolic pressure is 73.5 mmHg.  3. Left atrial size was mild to moderately dilated.  4. The mitral valve is normal in structure. Mild mitral valve  regurgitation. No evidence of mitral stenosis.  5. The aortic valve is tricuspid. Aortic valve regurgitation is trivial. No aortic stenosis is present.  6. The inferior vena cava is normal in size with greater than 50% respiratory variability, suggesting right atrial pressure of 3 mmHg.  -Comparison(s): 01/14/20 EF 30-35%. PA pressure 61mmHg.   Neuro/Psych negative neurological ROS  negative psych ROS   GI/Hepatic GERD  Medicated and Controlled,  Endo/Other  Hyperlipidemia  Renal/GU Renal InsufficiencyRenal disease  negative  genitourinary   Musculoskeletal  (+) Arthritis , Osteoarthritis,  DJD right knee Valgus deformity right knee   Abdominal   Peds  Hematology Eliquis therapy- last dose 02/16/21 am   Anesthesia Other Findings   Reproductive/Obstetrics                           Anesthesia Physical Anesthesia Plan  ASA: 3  Anesthesia Plan: Spinal   Post-op Pain Management:  Regional for Post-op pain   Induction: Intravenous  PONV Risk Score and Plan: 3 and Treatment may vary due to age or medical condition, Propofol infusion and Ondansetron  Airway Management Planned: Natural Airway, Simple Face Mask and Nasal Cannula  Additional Equipment:   Intra-op Plan:   Post-operative Plan:   Informed Consent: I have reviewed the patients History and Physical, chart, labs and discussed the procedure including the risks, benefits and alternatives for the proposed anesthesia with the patient or authorized representative who has indicated his/her understanding and acceptance.     Dental advisory given  Plan Discussed with: Anesthesiologist and CRNA  Anesthesia Plan Comments: (PAT note written 02/16/2021 by Myra Gianotti, PA-C. )       Anesthesia Quick Evaluation

## 2021-02-19 ENCOUNTER — Observation Stay (HOSPITAL_COMMUNITY): Payer: Medicare Other

## 2021-02-19 ENCOUNTER — Encounter (HOSPITAL_COMMUNITY)
Admission: RE | Disposition: A | Discharge status: Home / Self Care | Payer: Self-pay | Source: Home / Self Care | Attending: Orthopaedic Surgery

## 2021-02-19 ENCOUNTER — Ambulatory Visit (HOSPITAL_COMMUNITY): Payer: Medicare Other | Admitting: Physician Assistant

## 2021-02-19 ENCOUNTER — Encounter (HOSPITAL_COMMUNITY): Payer: Self-pay | Admitting: Orthopaedic Surgery

## 2021-02-19 ENCOUNTER — Ambulatory Visit (HOSPITAL_COMMUNITY): Payer: Medicare Other | Admitting: Anesthesiology

## 2021-02-19 ENCOUNTER — Other Ambulatory Visit: Payer: Self-pay

## 2021-02-19 ENCOUNTER — Observation Stay (HOSPITAL_COMMUNITY)
Admission: RE | Admit: 2021-02-19 | Discharge: 2021-02-20 | Disposition: A | Discharge status: Home / Self Care | Payer: Medicare Other | Attending: Orthopaedic Surgery | Admitting: Orthopaedic Surgery

## 2021-02-19 DIAGNOSIS — J45909 Unspecified asthma, uncomplicated: Secondary | ICD-10-CM | POA: Insufficient documentation

## 2021-02-19 DIAGNOSIS — Z87891 Personal history of nicotine dependence: Secondary | ICD-10-CM | POA: Insufficient documentation

## 2021-02-19 DIAGNOSIS — J9601 Acute respiratory failure with hypoxia: Secondary | ICD-10-CM | POA: Diagnosis not present

## 2021-02-19 DIAGNOSIS — N183 Chronic kidney disease, stage 3 unspecified: Secondary | ICD-10-CM | POA: Insufficient documentation

## 2021-02-19 DIAGNOSIS — M21061 Valgus deformity, not elsewhere classified, right knee: Secondary | ICD-10-CM | POA: Diagnosis not present

## 2021-02-19 DIAGNOSIS — M1731 Unilateral post-traumatic osteoarthritis, right knee: Principal | ICD-10-CM | POA: Diagnosis present

## 2021-02-19 DIAGNOSIS — G8918 Other acute postprocedural pain: Secondary | ICD-10-CM | POA: Diagnosis not present

## 2021-02-19 DIAGNOSIS — I4891 Unspecified atrial fibrillation: Secondary | ICD-10-CM | POA: Insufficient documentation

## 2021-02-19 DIAGNOSIS — Z96651 Presence of right artificial knee joint: Secondary | ICD-10-CM

## 2021-02-19 DIAGNOSIS — I509 Heart failure, unspecified: Secondary | ICD-10-CM | POA: Diagnosis not present

## 2021-02-19 DIAGNOSIS — Z7901 Long term (current) use of anticoagulants: Secondary | ICD-10-CM | POA: Insufficient documentation

## 2021-02-19 DIAGNOSIS — R52 Pain, unspecified: Secondary | ICD-10-CM

## 2021-02-19 DIAGNOSIS — I130 Hypertensive heart and chronic kidney disease with heart failure and stage 1 through stage 4 chronic kidney disease, or unspecified chronic kidney disease: Secondary | ICD-10-CM | POA: Insufficient documentation

## 2021-02-19 DIAGNOSIS — Z79899 Other long term (current) drug therapy: Secondary | ICD-10-CM | POA: Diagnosis not present

## 2021-02-19 DIAGNOSIS — Z471 Aftercare following joint replacement surgery: Secondary | ICD-10-CM | POA: Diagnosis not present

## 2021-02-19 HISTORY — PX: TOTAL KNEE ARTHROPLASTY: SHX125

## 2021-02-19 SURGERY — ARTHROPLASTY, KNEE, TOTAL
Anesthesia: Spinal | Site: Knee | Laterality: Right

## 2021-02-19 MED ORDER — CEFAZOLIN SODIUM-DEXTROSE 2-4 GM/100ML-% IV SOLN
2.0000 g | Freq: Three times a day (TID) | INTRAVENOUS | Status: AC
  Administered 2021-02-19 – 2021-02-20 (×2): 2 g via INTRAVENOUS
  Filled 2021-02-19 (×2): qty 100

## 2021-02-19 MED ORDER — DOCUSATE SODIUM 100 MG PO CAPS
100.0000 mg | ORAL_CAPSULE | Freq: Two times a day (BID) | ORAL | Status: DC
  Administered 2021-02-19 – 2021-02-20 (×2): 100 mg via ORAL
  Filled 2021-02-19 (×2): qty 1

## 2021-02-19 MED ORDER — FENTANYL CITRATE (PF) 100 MCG/2ML IJ SOLN: 50.0000 ug | Freq: Once | INTRAMUSCULAR | Status: AC

## 2021-02-19 MED ORDER — POLYETHYLENE GLYCOL 3350 17 G PO PACK
17.0000 g | PACK | Freq: Every day | ORAL | Status: DC
  Administered 2021-02-19: 17 g via ORAL
  Filled 2021-02-19: qty 1

## 2021-02-19 MED ORDER — METHOCARBAMOL 1000 MG/10ML IJ SOLN: 500.0000 mg | Freq: Four times a day (QID) | INTRAVENOUS | Status: DC | PRN

## 2021-02-19 MED ORDER — ROPIVACAINE HCL 7.5 MG/ML IJ SOLN
INTRAMUSCULAR | Status: DC | PRN
  Administered 2021-02-19: 20 mL via PERINEURAL

## 2021-02-19 MED ORDER — ONDANSETRON HCL 4 MG/2ML IJ SOLN
INTRAMUSCULAR | Status: AC
  Filled 2021-02-19: qty 2

## 2021-02-19 MED ORDER — PROPOFOL 10 MG/ML IV BOLUS
INTRAVENOUS | Status: AC
  Filled 2021-02-19: qty 20

## 2021-02-19 MED ORDER — LACTATED RINGERS IV SOLN: INTRAVENOUS | Status: DC

## 2021-02-19 MED ORDER — VANCOMYCIN HCL 1000 MG IV SOLR
INTRAVENOUS | Status: AC
  Filled 2021-02-19: qty 1000

## 2021-02-19 MED ORDER — OXYCODONE HCL 5 MG/5ML PO SOLN: 5.0000 mg | Freq: Once | ORAL | Status: AC | PRN

## 2021-02-19 MED ORDER — BUPIVACAINE-MELOXICAM ER 400-12 MG/14ML IJ SOLN
INTRAMUSCULAR | Status: AC
  Filled 2021-02-19: qty 1

## 2021-02-19 MED ORDER — SODIUM CHLORIDE 0.9 % IR SOLN
Status: DC | PRN
  Administered 2021-02-19: 3000 mL

## 2021-02-19 MED ORDER — AMIODARONE HCL 100 MG PO TABS
100.0000 mg | ORAL_TABLET | Freq: Every day | ORAL | Status: DC
  Administered 2021-02-20: 100 mg via ORAL
  Filled 2021-02-19: qty 1

## 2021-02-19 MED ORDER — HYDROMORPHONE HCL 1 MG/ML IJ SOLN
0.2500 mg | INTRAMUSCULAR | Status: DC | PRN
  Administered 2021-02-19: 0.25 mg via INTRAVENOUS

## 2021-02-19 MED ORDER — PROPOFOL 500 MG/50ML IV EMUL
INTRAVENOUS | Status: DC | PRN
  Administered 2021-02-19: 50 ug/kg/min via INTRAVENOUS

## 2021-02-19 MED ORDER — ORAL CARE MOUTH RINSE
15.0000 mL | Freq: Once | OROMUCOSAL | Status: AC
  Administered 2021-02-19: 15 mL via OROMUCOSAL

## 2021-02-19 MED ORDER — BUPIVACAINE IN DEXTROSE 0.75-8.25 % IT SOLN
INTRATHECAL | Status: DC | PRN
  Administered 2021-02-19: 1.6 mL via INTRATHECAL

## 2021-02-19 MED ORDER — ONDANSETRON HCL 4 MG/2ML IJ SOLN: 4.0000 mg | Freq: Four times a day (QID) | INTRAMUSCULAR | Status: DC | PRN

## 2021-02-19 MED ORDER — FENTANYL CITRATE (PF) 100 MCG/2ML IJ SOLN
INTRAMUSCULAR | Status: AC
  Administered 2021-02-19: 50 ug via INTRAVENOUS
  Filled 2021-02-19: qty 2

## 2021-02-19 MED ORDER — MENTHOL 3 MG MT LOZG: 1.0000 | LOZENGE | OROMUCOSAL | Status: DC | PRN

## 2021-02-19 MED ORDER — OXYCODONE HCL 5 MG PO TABS
5.0000 mg | ORAL_TABLET | ORAL | Status: DC | PRN
  Administered 2021-02-19 – 2021-02-20 (×4): 10 mg via ORAL
  Filled 2021-02-19 (×4): qty 2

## 2021-02-19 MED ORDER — TRANEXAMIC ACID-NACL 1000-0.7 MG/100ML-% IV SOLN
1000.0000 mg | INTRAVENOUS | Status: AC
  Administered 2021-02-19: 1000 mg via INTRAVENOUS
  Filled 2021-02-19: qty 100

## 2021-02-19 MED ORDER — IRRISEPT - 450ML BOTTLE WITH 0.05% CHG IN STERILE WATER, USP 99.95% OPTIME
TOPICAL | Status: DC | PRN
  Administered 2021-02-19: 450 mL via TOPICAL

## 2021-02-19 MED ORDER — HYDROMORPHONE HCL 1 MG/ML IJ SOLN
INTRAMUSCULAR | Status: AC
  Filled 2021-02-19: qty 1

## 2021-02-19 MED ORDER — VANCOMYCIN HCL 1000 MG IV SOLR
INTRAVENOUS | Status: DC | PRN
  Administered 2021-02-19: 1000 mg via TOPICAL

## 2021-02-19 MED ORDER — ACETAMINOPHEN 325 MG PO TABS: 325.0000 mg | ORAL_TABLET | Freq: Four times a day (QID) | ORAL | Status: DC | PRN

## 2021-02-19 MED ORDER — MIDAZOLAM HCL 2 MG/2ML IJ SOLN: 1.0000 mg | Freq: Once | INTRAMUSCULAR | Status: AC

## 2021-02-19 MED ORDER — ACETAMINOPHEN 500 MG PO TABS
1000.0000 mg | ORAL_TABLET | Freq: Four times a day (QID) | ORAL | Status: AC
  Administered 2021-02-19 – 2021-02-20 (×4): 1000 mg via ORAL
  Filled 2021-02-19 (×4): qty 2

## 2021-02-19 MED ORDER — TRANEXAMIC ACID 1000 MG/10ML IV SOLN
INTRAVENOUS | Status: DC | PRN
  Administered 2021-02-19: 2000 mg via TOPICAL

## 2021-02-19 MED ORDER — HYDROMORPHONE HCL 1 MG/ML IJ SOLN: 0.5000 mg | INTRAMUSCULAR | Status: DC | PRN

## 2021-02-19 MED ORDER — FENTANYL CITRATE (PF) 100 MCG/2ML IJ SOLN
INTRAMUSCULAR | Status: DC | PRN
  Administered 2021-02-19: 100 ug via INTRAVENOUS

## 2021-02-19 MED ORDER — PHENOL 1.4 % MT LIQD: 1.0000 | OROMUCOSAL | Status: DC | PRN

## 2021-02-19 MED ORDER — METOCLOPRAMIDE HCL 5 MG PO TABS
5.0000 mg | ORAL_TABLET | Freq: Three times a day (TID) | ORAL | Status: DC | PRN
Start: 2021-02-19 — End: 2021-02-20

## 2021-02-19 MED ORDER — 0.9 % SODIUM CHLORIDE (POUR BTL) OPTIME
TOPICAL | Status: DC | PRN
  Administered 2021-02-19: 1000 mL

## 2021-02-19 MED ORDER — PHENYLEPHRINE HCL-NACL 10-0.9 MG/250ML-% IV SOLN
INTRAVENOUS | Status: DC | PRN
  Administered 2021-02-19: 20 ug/min via INTRAVENOUS

## 2021-02-19 MED ORDER — POVIDONE-IODINE 10 % EX SWAB
2.0000 "application " | Freq: Once | CUTANEOUS | Status: AC
  Administered 2021-02-19: 2 via TOPICAL

## 2021-02-19 MED ORDER — APIXABAN 5 MG PO TABS
5.0000 mg | ORAL_TABLET | Freq: Two times a day (BID) | ORAL | Status: DC
  Administered 2021-02-20: 5 mg via ORAL
  Filled 2021-02-19 (×2): qty 1

## 2021-02-19 MED ORDER — ONDANSETRON HCL 4 MG/2ML IJ SOLN
INTRAMUSCULAR | Status: DC | PRN
  Administered 2021-02-19: 4 mg via INTRAVENOUS

## 2021-02-19 MED ORDER — METOCLOPRAMIDE HCL 5 MG/ML IJ SOLN: 5.0000 mg | Freq: Three times a day (TID) | INTRAMUSCULAR | Status: DC | PRN

## 2021-02-19 MED ORDER — OXYCODONE HCL 5 MG PO TABS
ORAL_TABLET | ORAL | Status: AC
  Filled 2021-02-19: qty 1

## 2021-02-19 MED ORDER — PROPOFOL 10 MG/ML IV BOLUS
INTRAVENOUS | Status: DC | PRN
  Administered 2021-02-19: 30 mg via INTRAVENOUS

## 2021-02-19 MED ORDER — CEFAZOLIN SODIUM-DEXTROSE 2-4 GM/100ML-% IV SOLN: 2.0000 g | INTRAVENOUS | Status: DC

## 2021-02-19 MED ORDER — CHLORHEXIDINE GLUCONATE 0.12 % MT SOLN: 15.0000 mL | Freq: Once | OROMUCOSAL | Status: AC

## 2021-02-19 MED ORDER — LIDOCAINE 2% (20 MG/ML) 5 ML SYRINGE
INTRAMUSCULAR | Status: DC | PRN
  Administered 2021-02-19: 100 mg via INTRAVENOUS

## 2021-02-19 MED ORDER — KETOROLAC TROMETHAMINE 15 MG/ML IJ SOLN
15.0000 mg | Freq: Four times a day (QID) | INTRAMUSCULAR | Status: DC
  Administered 2021-02-19 – 2021-02-20 (×3): 15 mg via INTRAVENOUS
  Filled 2021-02-19 (×3): qty 1

## 2021-02-19 MED ORDER — OXYCODONE HCL 5 MG PO TABS
5.0000 mg | ORAL_TABLET | Freq: Once | ORAL | Status: AC | PRN
  Administered 2021-02-19: 5 mg via ORAL

## 2021-02-19 MED ORDER — EPHEDRINE SULFATE-NACL 50-0.9 MG/10ML-% IV SOSY
PREFILLED_SYRINGE | INTRAVENOUS | Status: DC | PRN
  Administered 2021-02-19: 15 mg via INTRAVENOUS
  Administered 2021-02-19 (×3): 10 mg via INTRAVENOUS

## 2021-02-19 MED ORDER — EPHEDRINE 5 MG/ML INJ
INTRAVENOUS | Status: AC
  Filled 2021-02-19: qty 10

## 2021-02-19 MED ORDER — MIDAZOLAM HCL 2 MG/2ML IJ SOLN
INTRAMUSCULAR | Status: AC
  Administered 2021-02-19: 1 mg via INTRAVENOUS
  Filled 2021-02-19: qty 2

## 2021-02-19 MED ORDER — METHOCARBAMOL 500 MG PO TABS
500.0000 mg | ORAL_TABLET | Freq: Four times a day (QID) | ORAL | Status: DC | PRN
  Administered 2021-02-19: 500 mg via ORAL
  Filled 2021-02-19: qty 1

## 2021-02-19 MED ORDER — CARVEDILOL 3.125 MG PO TABS
3.1250 mg | ORAL_TABLET | Freq: Two times a day (BID) | ORAL | Status: DC
  Administered 2021-02-20: 3.125 mg via ORAL
  Filled 2021-02-19: qty 1

## 2021-02-19 MED ORDER — TRANEXAMIC ACID-NACL 1000-0.7 MG/100ML-% IV SOLN
1000.0000 mg | Freq: Once | INTRAVENOUS | Status: AC
  Administered 2021-02-19: 1000 mg via INTRAVENOUS
  Filled 2021-02-19: qty 100

## 2021-02-19 MED ORDER — CEFAZOLIN SODIUM-DEXTROSE 2-3 GM-%(50ML) IV SOLR
INTRAVENOUS | Status: DC | PRN
  Administered 2021-02-19: 2 g via INTRAVENOUS

## 2021-02-19 MED ORDER — OXYCODONE HCL 5 MG PO TABS: 10.0000 mg | ORAL_TABLET | ORAL | Status: DC | PRN

## 2021-02-19 MED ORDER — BUPIVACAINE-MELOXICAM ER 400-12 MG/14ML IJ SOLN
INTRAMUSCULAR | Status: DC | PRN
  Administered 2021-02-19: 17 mL

## 2021-02-19 MED ORDER — CEFAZOLIN SODIUM-DEXTROSE 2-4 GM/100ML-% IV SOLN
INTRAVENOUS | Status: AC
  Filled 2021-02-19: qty 100

## 2021-02-19 MED ORDER — FENTANYL CITRATE (PF) 250 MCG/5ML IJ SOLN
INTRAMUSCULAR | Status: AC
  Filled 2021-02-19: qty 5

## 2021-02-19 MED ORDER — ONDANSETRON HCL 4 MG PO TABS
4.0000 mg | ORAL_TABLET | Freq: Four times a day (QID) | ORAL | Status: DC | PRN
  Administered 2021-02-20: 4 mg via ORAL
  Filled 2021-02-19: qty 1

## 2021-02-19 MED ORDER — ONDANSETRON HCL 4 MG/2ML IJ SOLN
4.0000 mg | Freq: Once | INTRAMUSCULAR | Status: AC | PRN
  Administered 2021-02-19: 4 mg via INTRAVENOUS

## 2021-02-19 SURGICAL SUPPLY — 87 items
ADH SKN CLS APL DERMABOND .7 (GAUZE/BANDAGES/DRESSINGS) ×1
ADH SKN CLS LQ APL DERMABOND (GAUZE/BANDAGES/DRESSINGS) ×1
ALCOHOL 70% 16 OZ (MISCELLANEOUS) ×2 IMPLANT
BAG DECANTER FOR FLEXI CONT (MISCELLANEOUS) ×2 IMPLANT
BANDAGE ESMARK 6X9 LF (GAUZE/BANDAGES/DRESSINGS) IMPLANT
BLADE SAG 18X100X1.27 (BLADE) ×2 IMPLANT
BLADE SAW SAG 90X13X1.27 (BLADE) ×1 IMPLANT
BNDG CMPR 9X6 STRL LF SNTH (GAUZE/BANDAGES/DRESSINGS)
BNDG ESMARK 6X9 LF (GAUZE/BANDAGES/DRESSINGS)
BOWL SMART MIX CTS (DISPOSABLE) ×2 IMPLANT
BSPLAT TIB 5D E CMNT KN RT (Knees) ×1 IMPLANT
CEMENT BONE REFOBACIN R1X40 US (Cement) ×3 IMPLANT
CLSR STERI-STRIP ANTIMIC 1/2X4 (GAUZE/BANDAGES/DRESSINGS) ×4 IMPLANT
COMP FEM REV CMT STD 7 RT (Joint) ×2 IMPLANT
COMPONENT FEM REV CMT STD 7 RT (Joint) IMPLANT
COOLER ICEMAN CLASSIC (MISCELLANEOUS) ×2 IMPLANT
COVER SURGICAL LIGHT HANDLE (MISCELLANEOUS) ×2 IMPLANT
COVER WAND RF STERILE (DRAPES) IMPLANT
CUFF TOURN SGL QUICK 34 (TOURNIQUET CUFF) ×2
CUFF TRNQT CYL 34X4.125X (TOURNIQUET CUFF) ×1 IMPLANT
DERMABOND ADHESIVE PROPEN (GAUZE/BANDAGES/DRESSINGS) ×1
DERMABOND ADVANCED (GAUZE/BANDAGES/DRESSINGS) ×1
DERMABOND ADVANCED .7 DNX12 (GAUZE/BANDAGES/DRESSINGS) ×1 IMPLANT
DERMABOND ADVANCED .7 DNX6 (GAUZE/BANDAGES/DRESSINGS) IMPLANT
DRAPE EXTREMITY T 121X128X90 (DISPOSABLE) ×2 IMPLANT
DRAPE HALF SHEET 40X57 (DRAPES) ×2 IMPLANT
DRAPE INCISE IOBAN 66X45 STRL (DRAPES) IMPLANT
DRAPE ORTHO SPLIT 77X108 STRL (DRAPES) ×4
DRAPE POUCH INSTRU U-SHP 10X18 (DRAPES) ×2 IMPLANT
DRAPE SURG ORHT 6 SPLT 77X108 (DRAPES) ×2 IMPLANT
DRAPE U-SHAPE 47X51 STRL (DRAPES) ×4 IMPLANT
DRSG AQUACEL AG ADV 3.5X10 (GAUZE/BANDAGES/DRESSINGS) ×2 IMPLANT
DURAPREP 26ML APPLICATOR (WOUND CARE) ×6 IMPLANT
ELECT CAUTERY BLADE 6.4 (BLADE) ×2 IMPLANT
ELECT REM PT RETURN 9FT ADLT (ELECTROSURGICAL) ×2
ELECTRODE REM PT RTRN 9FT ADLT (ELECTROSURGICAL) ×1 IMPLANT
GLOVE ECLIPSE 7.0 STRL STRAW (GLOVE) ×6 IMPLANT
GLOVE SKINSENSE NS SZ7.5 (GLOVE) ×3
GLOVE SKINSENSE STRL SZ7.5 (GLOVE) ×3 IMPLANT
GLOVE SURG SYN 7.5  E (GLOVE) ×8
GLOVE SURG SYN 7.5 E (GLOVE) ×4 IMPLANT
GLOVE SURG SYN 7.5 PF PI (GLOVE) ×4 IMPLANT
GLOVE SURG UNDER POLY LF SZ7 (GLOVE) ×2 IMPLANT
GOWN STRL REIN XL XLG (GOWN DISPOSABLE) ×2 IMPLANT
GOWN STRL REUS W/ TWL LRG LVL3 (GOWN DISPOSABLE) ×1 IMPLANT
GOWN STRL REUS W/TWL LRG LVL3 (GOWN DISPOSABLE) ×2
HANDPIECE INTERPULSE COAX TIP (DISPOSABLE) ×2
HDLS TROCR DRIL PIN KNEE 75 (PIN) ×2
HOOD PEEL AWAY FLYTE STAYCOOL (MISCELLANEOUS) ×4 IMPLANT
INSERT ASF VE PS 16 6-9/EF LT (Insert) ×1 IMPLANT
INSERT TIB ASF PERSONA 14 (Orthopedic Implant) IMPLANT
JET LAVAGE IRRISEPT WOUND (IRRIGATION / IRRIGATOR) ×2
KIT BASIN OR (CUSTOM PROCEDURE TRAY) ×2 IMPLANT
KIT TURNOVER KIT B (KITS) ×2 IMPLANT
LAVAGE JET IRRISEPT WOUND (IRRIGATION / IRRIGATOR) ×1 IMPLANT
MANIFOLD NEPTUNE II (INSTRUMENTS) ×2 IMPLANT
MARKER SKIN DUAL TIP RULER LAB (MISCELLANEOUS) ×2 IMPLANT
NDL SPNL 18GX3.5 QUINCKE PK (NEEDLE) ×2 IMPLANT
NEEDLE SPNL 18GX3.5 QUINCKE PK (NEEDLE) ×4 IMPLANT
NS IRRIG 1000ML POUR BTL (IV SOLUTION) ×2 IMPLANT
PACK TOTAL JOINT (CUSTOM PROCEDURE TRAY) ×2 IMPLANT
PAD ARMBOARD 7.5X6 YLW CONV (MISCELLANEOUS) ×4 IMPLANT
PAD COLD SHLDR WRAP-ON (PAD) ×2 IMPLANT
PIN DRILL HDLS TROCAR 75 4PK (PIN) IMPLANT
SAW OSC TIP CART 19.5X105X1.3 (SAW) ×2 IMPLANT
SCREW FEMALE HEX FIX 25X2.5 (ORTHOPEDIC DISPOSABLE SUPPLIES) ×1 IMPLANT
SET HNDPC FAN SPRY TIP SCT (DISPOSABLE) ×1 IMPLANT
STAPLER VISISTAT 35W (STAPLE) IMPLANT
STEM POLY PAT PLY 32M KNEE (Knees) ×1 IMPLANT
STEM TIB ST PERS 14+30 (Stem) ×2 IMPLANT
STEM TIBIA 5 DEG SZ E R KNEE (Knees) IMPLANT
SUCTION FRAZIER HANDLE 10FR (MISCELLANEOUS) ×2
SUCTION TUBE FRAZIER 10FR DISP (MISCELLANEOUS) ×1 IMPLANT
SUT ETHILON 2 0 FS 18 (SUTURE) IMPLANT
SUT MNCRL AB 4-0 PS2 18 (SUTURE) IMPLANT
SUT VIC AB 0 CT1 27 (SUTURE) ×4
SUT VIC AB 0 CT1 27XBRD ANBCTR (SUTURE) ×2 IMPLANT
SUT VIC AB 1 CTX 27 (SUTURE) ×6 IMPLANT
SUT VIC AB 2-0 CT1 27 (SUTURE) ×8
SUT VIC AB 2-0 CT1 TAPERPNT 27 (SUTURE) ×4 IMPLANT
SYR 50ML LL SCALE MARK (SYRINGE) ×4 IMPLANT
TIBIA STEM 5 DEG SZ E R KNEE (Knees) ×2 IMPLANT
TOWEL GREEN STERILE (TOWEL DISPOSABLE) ×2 IMPLANT
TOWEL GREEN STERILE FF (TOWEL DISPOSABLE) ×2 IMPLANT
TRAY CATH 16FR W/PLASTIC CATH (SET/KITS/TRAYS/PACK) IMPLANT
UNDERPAD 30X36 HEAVY ABSORB (UNDERPADS AND DIAPERS) ×2 IMPLANT
WRAP KNEE MAXI GEL POST OP (GAUZE/BANDAGES/DRESSINGS) ×1 IMPLANT

## 2021-02-19 NOTE — Op Note (Addendum)
Total Knee Arthroplasty Procedure Note  Preoperative diagnosis: Right knee post-traumatic osteoarthritis valgus deformity  Postoperative diagnosis:same  Operative procedure: Right total knee arthroplasty. CPT [phone]  Surgeon: N. Eduard Roux, MD  Assist: Madalyn Rob, PA-C; necessary for the timely completion of procedure and due to complexity of procedure.  Anesthesia: Spinal, regional, local  Tourniquet time: see anesthesia record  Implants used: Zimmer persona  Femur: Revision PS 5, 30 mm stem Tibia: E, 30 mm stem Patella: 32 mm Polyethylene: 16 mm, constrained  Indication: Erin Wong is a 70 y.o. year old female with a history of knee pain. Having failed conservative management, the patient elected to proceed with a total knee arthroplasty.  We have reviewed the risk and benefits of the surgery and they elected to proceed after voicing understanding.  Procedure:  After informed consent was obtained and understanding of the risk were voiced including but not limited to peroneal nerve palsy, bleeding, infection, damage to surrounding structures including nerves and vessels, blood clots, leg length inequality and the failure to achieve desired results, the operative extremity was marked with verbal confirmation of the patient in the holding area.   The patient was then brought to the operating room and transported to the operating room table in the supine position.  A tourniquet was applied to the operative extremity around the upper thigh. The operative limb was then prepped and draped in the usual sterile fashion and preoperative antibiotics were administered.  A time out was performed prior to the start of surgery confirming the correct extremity, preoperative antibiotic administration, as well as team members, implants and instruments available for the case. Correct surgical site was also confirmed with preoperative radiographs. The limb was then elevated for  exsanguination and the tourniquet was inflated. A midline incision was made and a standard medial parapatellar approach was performed.  The infrapatellar fat pad was removed.  Suprapatellar synovium was removed to reveal the anterior distal femoral cortex.  A medial peel was performed to release the capsule of the medial tibial plateau.  The patella was then everted and was prepared and sized to a 35 mm.  A cover was placed on the patella for protection from retractors.  The knee was then brought into full flexion and we then turned our attention to the femur.  The cruciates were sacrificed.  There was severe posttraumatic wear on the lateral femoral condyle as well as defect of the lateral tibial plateau from prior fracture.  Start site was drilled in the femur and the intramedullary distal femoral cutting guide was placed, set at 6 degrees valgus, taking 12 mm of distal resection. The distal cut was made. Osteophytes were then removed.  Next, the proximal tibial cutting guide was placed with appropriate slope, varus/valgus alignment and depth of resection. The proximal tibial cut was made. Gap blocks were then used to assess the extension gap and alignment, and appropriate soft tissue releases were performed. Attention was turned back to the femur, which was sized using the sizing guide to a size 8. Appropriate rotation of the femoral component was determined using epicondylar axis, Whiteside's line, and assessing the flexion gap under ligament tension. The appropriate size 4-in-1 cutting block was placed and checked with an angel wing and cuts were made.  Her bone quality especially on the medial femoral condyle was poor.  Posterior femoral osteophytes and uncapped bone were then removed with the curved osteotome.  Trial components were placed, and stability was checked in full extension,  mid-flexion, and deep flexion. Proper tibial rotation was determined and marked.  The patella tracked well without a lateral  release.  Because of the poor bone quality the femur was prepped for a 30 mm stem.  The MCL was lax due to chronic valgus deformity and I felt it was best to use a constrained polyethylene liner therefore the femur was prepped for PS constrained. Trial components were then removed and tibial preparation performed.  The tibia was sized for a size F component with a 30 mm stem. The bony surfaces were irrigated with a pulse lavage and then dried. Bone cement was vacuum mixed on the back table, and the final components sized above were cemented into place.  Antibiotic irrigation was placed in the knee joint and soft tissues while the cement cured.  After cement had finished curing, excess cement was removed. The stability of the construct was re-evaluated throughout a range of motion and found to be acceptable. The trial liner was removed, the knee was copiously irrigated, and the knee was re-evaluated for any excess bone debris. The real polyethylene liner, 14 mm thick, was inserted and checked to ensure the locking mechanism had engaged appropriately. The tourniquet was deflated and hemostasis was achieved. The wound was irrigated with normal saline.  One gram of vancomycin powder was placed in the surgical bed.  Capsular closure was performed with a #1 vicryl, subcutaneous fat closed with a 0 vicryl suture, then subcutaneous tissue closed with interrupted 2.0 vicryl suture. The skin was then closed with a 2-0 nylon and Dermabond. A sterile dressing was applied.  The patient was awakened in the operating room and taken to recovery in stable condition. All sponge, needle, and instrument counts were correct at the end of the case.  [name] was necessary for opening, closing, retracting, limb positioning and overall facilitation and completion of the surgery.  Position: supine  Complications: none.  Time Out: performed   Drains/Packing: none  Estimated blood loss: minimal  Returned to Recovery  Room: in good condition.   Antibiotics: yes   Mechanical VTE (DVT) Prophylaxis: sequential compression devices, TED thigh-high  Chemical VTE (DVT) Prophylaxis: Aspirin  Fluid Replacement  Crystalloid: see anesthesia record Blood: none  FFP: none   Specimens Removed: 1 to pathology   Sponge and Instrument Count Correct? yes   PACU: portable radiograph - knee AP and Lateral   Plan/RTC: Return in 2 weeks for wound check.   Weight Bearing/Load Lower Extremity: full   Implant Name Type Inv. Item Serial No. Manufacturer Lot No. LRB No. Used Action  CEMENT BONE REFOBACIN [id number] [place] - [id number] Cement CEMENT BONE REFOBACIN R1X40 [place]  ZIMMER RECON(ORTH,TRAU,BIO,SG) [id number] Right 1 Implanted  CEMENT BONE REFOBACIN R1X40 [place] - [id number] Cement CEMENT BONE REFOBACIN R1X40 [place]  ZIMMER RECON(ORTH,TRAU,BIO,SG) [id number] Right 2 Implanted  STEM TIB ST PERS 14+30 - [id number] Stem STEM TIB ST PERS 14+30  ZIMMER RECON(ORTH,TRAU,BIO,SG) [id number] Right 1 Implanted  STEM POLY PAT PLY 8M KNEE - [id number] Knees STEM POLY PAT PLY 8M KNEE  ZIMMER RECON(ORTH,TRAU,BIO,SG) [id number] Right 1 Implanted  STEM TIB ST PERS 14+30 - [id number] Stem STEM TIB ST PERS 14+30  ZIMMER RECON(ORTH,TRAU,BIO,SG) [id number] Right 1 Implanted  INSERT TIB ASF PERSONA 14 - [id number] Orthopedic Implant INSERT TIB ASF PERSONA 14  ZIMMER RECON(ORTH,TRAU,BIO,SG) [id number] Right 1 Wasted  FEUMR REVISION Knees   ZIMMER KNEE [id number] Right 1 Implanted  TIBIA STEM 5 DEG SZ E R KNEE - [id number] Knees TIBIA STEM 5 DEG SZ  E R KNEE  ZIMMER RECON(ORTH,TRAU,BIO,SG) [id number] Right 1 Implanted  Vivacit Polyethylene 16 MM Knees    [id number] Right 1 Implanted    N. Eduard Roux, MD [facility] 8:29 PM

## 2021-02-19 NOTE — Anesthesia Procedure Notes (Signed)
Anesthesia Regional Block: Adductor canal block   Pre-Anesthetic Checklist: , timeout performed,  Correct Patient, Correct Site, Correct Laterality,  Correct Procedure, Correct Position, site marked,  Risks and benefits discussed,  Surgical consent,  Pre-op evaluation,  At surgeon's request and post-op pain management  Laterality: Right  Prep: chloraprep       Needles:  Injection technique: Single-shot  Needle Type: Echogenic Stimulator Needle     Needle Length: 10cm  Needle Gauge: 21   Needle insertion depth: 6 cm   Additional Needles:   Procedures:,,,, ultrasound used (permanent image in chart),,    Narrative:  Start time: 02/19/2021 2:20 PM End time: 02/19/2021 2:25 PM Injection made incrementally with aspirations every 5 mL.  Performed by: Personally  Anesthesiologist: Josephine Igo, MD  Additional Notes: Timeout performed. Patient sedated. Relevant anatomy ID'd using [place]. Incremental 2-33ml injection of LA with frequent aspiration. Patient tolerated procedure well.    Right Adductor Canal Block

## 2021-02-19 NOTE — Discharge Instructions (Signed)

## 2021-02-19 NOTE — Anesthesia Postprocedure Evaluation (Signed)
Anesthesia Post Note  Patient: Erin Wong  Procedure(s) Performed: RIGHT TOTAL KNEE ARTHROPLASTY (Right: Knee)     Patient location during evaluation: PACU Anesthesia Type: Spinal Level of consciousness: awake and alert, patient cooperative and oriented Pain management: pain level controlled Vital Signs Assessment: post-procedure vital signs reviewed and stable Respiratory status: spontaneous breathing, nonlabored ventilation and respiratory function stable Cardiovascular status: blood pressure returned to baseline and stable Postop Assessment: spinal receding and no apparent nausea or vomiting Anesthetic complications: no   No notable events documented.  Last Vitals:  Vitals:   02/19/21 1749 02/19/21 1804  BP: 131/68 (!) 138/57  Pulse: (!) 47 (!) 48  Resp: 15 10  Temp:    SpO2: 100% 100%    Last Pain:  Vitals:   02/19/21 1804  TempSrc:   PainSc: 0-No pain                 Nova Evett,E. Kailie Polus

## 2021-02-19 NOTE — Transfer of Care (Signed)
Immediate Anesthesia Transfer of Care Note  Patient: Erin Wong  Procedure(s) Performed: RIGHT TOTAL KNEE ARTHROPLASTY (Right: Knee)  Patient Location: PACU  Anesthesia Type:MAC combined with regional for post-op pain  Level of Consciousness: drowsy and patient cooperative  Airway & Oxygen Therapy: Patient Spontanous Breathing  Post-op Assessment: Report given to RN and Post -op Vital signs reviewed and stable  Post vital signs: Reviewed and stable  Last Vitals:  Vitals Value Taken Time  BP 112/64 02/19/21 1734  Temp    Pulse 48 02/19/21 1735  Resp 11 02/19/21 1735  SpO2 90 % 02/19/21 1735  Vitals shown include unvalidated device data.  Last Pain:  Vitals:   02/19/21 1430  TempSrc:   PainSc: 0-No pain      Patients Stated Pain Goal: 0 (97/58/83 2549)  Complications: No notable events documented.

## 2021-02-19 NOTE — Care Plan (Signed)
Ortho Bundle Case Management Note  Patient Details  Name: Erin Wong MRN: [id number] Date of Birth: Dec 24, 1950   [facility] RN Case Manager call to patient prior to her upcoming Right total knee arthroplasty with Dr. Erlinda Hong. She is an Ortho bundle through [facility] and is agreeable to case management. She lives with her husband, who will be assisting after discharge along with her daughter. She has a FWW and rollator at home. She has been provided a CPM for home use by [employer]. Declined 3in1/BSC at this time. HHPT choice provided and referral made to [facility] ([facility]). Will set her up for OPPT to begin here at [facility] at her request for approximately 2 weeks post op. Reviewed all post op care instructions. Will continue to follow for needs                  DME Arranged:  CPM (CPM delivered by [employer] prior to surgery; has FWW, rollator, and declined 3in1/BSC) DME Agency:  [employer]  HH Arranged:  PT HH Agency:  [facility]  Additional Comments: Please contact me with any questions of if this plan should need to change.  Jamse Arn, RN, BSN, [employer]  [phone] 02/19/2021, 4:06 PM

## 2021-02-19 NOTE — Anesthesia Procedure Notes (Signed)
Spinal  Patient location during procedure: OR Start time: 02/19/2021 2:52 PM End time: 02/19/2021 2:55 PM Reason for block: surgical anesthesia Staffing Performed: anesthesiologist  Anesthesiologist: Josephine Igo, MD Preanesthetic Checklist Completed: patient identified, IV checked, site marked, risks and benefits discussed, surgical consent, monitors and equipment checked, pre-op evaluation and timeout performed Spinal Block Patient position: sitting Prep: DuraPrep and site prepped and draped Patient monitoring: heart rate, cardiac monitor, continuous pulse ox and blood pressure Approach: midline Location: L3-4 Injection technique: single-shot Needle Needle type: Pencan  Needle gauge: 24 G Needle length: 9 cm Needle insertion depth: 6 cm Assessment Sensory level: T6 Events: CSF return Additional Notes Patient tolerated procedure well. Adequate sensory level.

## 2021-02-19 NOTE — H&P (Signed)
PREOPERATIVE H&P  Chief Complaint: right knee degenerative joint disease  HPI: Erin Wong is a 70 y.o. female who presents for surgical treatment of right knee degenerative joint disease.  She denies any changes in medical history.  Past Medical History:  Diagnosis Date   Allergy    Arthritis    Asthma    Atrial fibrillation (HCC)    Benign essential HTN    CHF (congestive heart failure) (HCC)    CKD (chronic kidney disease) stage 3, GFR 30-59 ml/min (HCC)    Dysrhythmia     Afib -  had Ablation   GERD (gastroesophageal reflux disease)    HLD (hyperlipidemia)    HTN (hypertension)    Pneumonia    Past Surgical History:  Procedure Laterality Date   ABDOMINAL HYSTERECTOMY  10/04/1995   Hysterectomy and Bilateral oophorectomy   CARDIOVERSION N/A 01/17/2020   Procedure: CARDIOVERSION;  Surgeon: [name], MD;  Location: [place];  Service: Cardiovascular;  Laterality: N/A;   COLON SURGERY  09/2009   hole in colon   FRACTURE SURGERY Right 08/02/2011   Right Leg--Plates & Screws   HAND SURGERY Right 09/02/2006   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--Arthritis   HARDWARE REMOVAL Right 12/04/2020   Procedure: rigth knee hardware removal;  Surgeon: Leandrew Koyanagi, MD;  Location: [place];  Service: Orthopedics;  Laterality: Right;   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   at time of colon surgery   TEE WITHOUT CARDIOVERSION N/A 01/17/2020   Procedure: TRANSESOPHAGEAL ECHOCARDIOGRAM (TEE);  Surgeon: [name], MD;  Location[facility];  Service: Cardiovascular;  Laterality: N/A;   Social History   Socioeconomic History   Marital status: Married    Spouse name: Not on file   Number of children: 3   Years of education: Not on file   Highest education level: Not on file  Occupational History   Occupation: retired  Tobacco Use   Smoking status: Former    Pack years: 0.00    Types: Cigarettes    Quit date: 01/10/1991    Years since quitting: 30.1   Smokeless tobacco: Never   Vaping Use   Vaping Use: Never used  Substance and Sexual Activity   Alcohol use: Not Currently    Comment: quit in May/2021   Drug use: No   Sexual activity: Never  Other Topics Concern   Not on file  Social History Narrative   Not on file   Social Determinants of Health   Financial Resource Strain: Not on file  Food Insecurity: Not on file  Transportation Needs: Not on file  Physical Activity: Not on file  Stress: Not on file  Social Connections: Not on file   Family History  Problem Relation Age of Onset   Arthritis Mother    Miscarriages / [place] Mother    Lung cancer Mother        Had quit smoking for 25 years   Esophageal cancer Mother    Alcohol abuse Father    Alcohol abuse Brother    Kidney cancer Brother    Breast cancer Sister    COPD Brother    Asthma Maternal Aunt    Diabetes Maternal Aunt    Colon cancer Neg Hx    Inflammatory bowel disease Neg Hx    Liver disease Neg Hx    Pancreatic cancer Neg Hx    Rectal cancer Neg Hx    Stomach cancer Neg Hx    No Known Allergies Prior  to Admission medications   Medication Sig Start Date End Date Taking? Authorizing Provider  albuterol (VENTOLIN HFA) 108 (90 Base) MCG/ACT inhaler Inhale 1 puff into the lungs every 6 (six) hours as needed for wheezing or shortness of breath. 01/01/21  Yes Eulogio Bear, NP  amiodarone (PACERONE) 200 MG tablet Take 1/2 tablet daily Patient taking differently: Take 100 mg by mouth daily. Take 1/2 tablet daily 01/22/21  Yes [name], MD  apixaban (ELIQUIS) 5 MG TABS tablet Take 1 tablet (5 mg total) by mouth 2 (two) times daily. 08/03/20  Yes [name], MD  atorvastatin (LIPITOR) 40 MG tablet Take 1 tablet (40 mg total) by mouth daily. 01/12/21  Yes [name], MD  benazepril (LOTENSIN) 20 MG tablet Take 1 tablet (20 mg total) by mouth daily. 01/12/21  Yes [name], MD  Biotin 5000 MCG TABS Take 5,000 mcg by mouth daily.   Yes [provider]  CALCIUM GLUCONATE PO Take 600 mg by mouth daily.   Yes [provider]  carvedilol (COREG) 3.125 MG tablet Take 1 tablet (3.125 mg total) by mouth 2 (two) times daily. 01/19/21  Yes [name], MD  Cholecalciferol 25 MCG (1000 UT) tablet Take 1 tablet (1,000 Units total) by mouth daily. 01/01/21  Yes Eulogio Bear, NP  fluticasone (FLONASE) 50 MCG/ACT nasal spray Place 2 sprays into both nostrils daily as needed for allergies. 01/01/21  Yes Noemi Chapel A, NP  fluticasone-salmeterol (ADVAIR) 100-50 MCG/ACT AEPB Inhale 1 puff into the lungs 2 (two) times daily. 01/01/21  Yes Eulogio Bear, NP  furosemide (LASIX) 40 MG tablet TAKE 1 TABLET BY MOUTH EVERY DAY Patient taking differently: Take 40 mg by mouth daily. 01/12/21  Yes [name], MD  omeprazole (PRILOSEC) 20 MG capsule TAKE 1 CAPSULE BY MOUTH EVERY DAY Patient taking differently: Take 20 mg by mouth daily. 11/14/20  Yes [name], MD  polyethylene glycol (MIRALAX / GLYCOLAX) 17 g packet Take 17 g by mouth daily.   Yes [provider]  docusate sodium (COLACE) 100 MG capsule Take 1 capsule (100 mg total) by mouth daily as needed. 02/14/21 02/14/22  Aundra Dubin, PA-C  methocarbamol (ROBAXIN) 500 MG tablet Take 1 tablet (500 mg total) by mouth 2 (two) times daily as needed. To be taken after surgery 02/14/21   Aundra Dubin, PA-C  ondansetron (ZOFRAN) 4 MG tablet Take 1 tablet (4 mg total) by mouth every 8 (eight) hours as needed for nausea or vomiting. 02/14/21   Aundra Dubin, PA-C  oxyCODONE-acetaminophen (PERCOCET) 5-325 MG tablet Take 1-2 tablets by mouth every 6 (six) hours as needed. To be taken after surgery 02/14/21   Aundra Dubin, PA-C     Positive ROS: All other systems have been reviewed and were otherwise negative with the exception of those mentioned in the HPI and as above.  Physical Exam: General: Alert, no acute distress Cardiovascular: No pedal  edema Respiratory: No cyanosis, no use of accessory musculature GI: abdomen soft Skin: No lesions in the area of chief complaint Neurologic: Sensation intact distally Psychiatric: Patient is competent for consent with normal mood and affect Lymphatic: no lymphedema  MUSCULOSKELETAL: exam stable  Assessment: right knee degenerative joint disease  Plan: Plan for Procedure(s): RIGHT TOTAL KNEE ARTHROPLASTY  The risks benefits and alternatives were discussed with the patient including but not limited to the risks of nonoperative treatment, versus surgical intervention including infection, bleeding,  nerve injury,  blood clots, cardiopulmonary complications, morbidity, mortality, among others, and they were willing to proceed.   Preoperative templating of the joint replacement has been completed, documented, and submitted to the Operating Room personnel in order to optimize intra-operative equipment management.   Eduard Roux, MD 02/19/2021 2:15 PM

## 2021-02-20 ENCOUNTER — Encounter (HOSPITAL_COMMUNITY): Payer: Self-pay | Admitting: Orthopaedic Surgery

## 2021-02-20 DIAGNOSIS — I509 Heart failure, unspecified: Secondary | ICD-10-CM | POA: Diagnosis not present

## 2021-02-20 DIAGNOSIS — M21061 Valgus deformity, not elsewhere classified, right knee: Secondary | ICD-10-CM | POA: Diagnosis not present

## 2021-02-20 DIAGNOSIS — M1731 Unilateral post-traumatic osteoarthritis, right knee: Secondary | ICD-10-CM | POA: Diagnosis not present

## 2021-02-20 DIAGNOSIS — J45909 Unspecified asthma, uncomplicated: Secondary | ICD-10-CM | POA: Diagnosis not present

## 2021-02-20 DIAGNOSIS — I130 Hypertensive heart and chronic kidney disease with heart failure and stage 1 through stage 4 chronic kidney disease, or unspecified chronic kidney disease: Secondary | ICD-10-CM | POA: Diagnosis not present

## 2021-02-20 DIAGNOSIS — N183 Chronic kidney disease, stage 3 unspecified: Secondary | ICD-10-CM | POA: Diagnosis not present

## 2021-02-20 MED ORDER — HYDROXYZINE HCL 50 MG/ML IM SOLN
50.0000 mg | Freq: Once | INTRAMUSCULAR | Status: AC
  Administered 2021-02-20: 50 mg via INTRAMUSCULAR
  Filled 2021-02-20: qty 1

## 2021-02-20 NOTE — Progress Notes (Addendum)
Subjective: 1 Day Post-Op Procedure(s) (LRB): RIGHT TOTAL KNEE ARTHROPLASTY (Right) Patient reports pain as mild.  Feeling good this am.  Some nausea/vomiting, but this has greatly improved  Objective: Vital signs in last 24 hours: Temp:  [97.4 F (36.3 C)-97.9 F (36.6 C)] 97.8 F (36.6 C) (06/21 0351) Pulse Rate:  [47-66] 59 (06/21 0351) Resp:  [10-21] 16 (06/21 0351) BP: (112-166)/(53-91) 145/62 (06/21 0351) SpO2:  [94 %-100 %] 99 % (06/21 0351) Weight:  [64 kg] 64 kg (06/20 1327)  Intake/Output from previous day: 06/20 0701 - 06/21 0700 In: 1700 [I.V.:1700] Out: 2175 [Urine:2075; Blood:100] Intake/Output this shift: No intake/output data recorded.  No results for input(s): HGB in the last 72 hours. No results for input(s): WBC, RBC, HCT, PLT in the last 72 hours. No results for input(s): NA, K, CL, CO2, BUN, CREATININE, GLUCOSE, CALCIUM in the last 72 hours. No results for input(s): LABPT, INR in the last 72 hours.  Neurologically intact Neurovascular intact Sensation intact distally Intact pulses distally Dorsiflexion/Plantar flexion intact Incision: scant drainage No cellulitis present Compartment soft EHL/FHL intact   Assessment/Plan: 1 Day Post-Op Procedure(s) (LRB): RIGHT TOTAL KNEE ARTHROPLASTY (Right) Advance diet Up with therapy D/C IV fluids Discharge home with home health after second PT session WBAT RLE   Anticipated LOS equal to or greater than 2 midnights due to - Age 71 and older with one or more of the following:  - Obesity  - Expected need for hospital services (PT, OT, Nursing) required for safe  discharge  - Anticipated need for postoperative skilled nursing care or inpatient rehab  - Active co-morbidities: ckd, a-fibb OR   - Unanticipated findings during/Post Surgery: None  - Patient is a high risk of re-admission due to: None   Aundra Dubin 02/20/2021, 7:54 AM

## 2021-02-20 NOTE — Evaluation (Signed)
Physical Therapy Evaluation Patient Details Name: Erin Wong MRN: [id number] DOB: 07/13/1951 Today's Date: 02/20/2021   History of Present Illness  70 yo female s/p R TKA on 6/21. PMH including arthritis, asthma, A fib, HTN, CHF, CKD stage 3, and pneumonia.  Clinical Impression  Pt is s/p TKA resulting in the deficits listed below (see PT Problem List). Lives at home with spouse in a single level home with 5 steps to enter; [facility] with a RW prn at baseline; Presents to PT with anticipated R knee pain postop, general unsteadiness with amb; Pt's daughter will be staying with pt to give assist and understands that she will need to be very close to pt when she is walking for safety; Provided pt and family with a gait belt;  Pt will benefit from skilled PT to increase their independence and safety with mobility to allow discharge to the venue listed below. Overall moving well and OK for dc home from PT standpoint      Follow Up Recommendations Follow surgeon's recommendation for DC plan and follow-up therapies;Home health PT    Equipment Recommendations  None recommended by PT    Recommendations for Other Services       Precautions / Restrictions Precautions Precautions: Knee Precaution Booklet Issued: Yes (comment) Precaution Comments: Pt educated to not allow any pillow or bolster under knee for healing with optimal range of motion.  Restrictions Weight Bearing Restrictions: Yes RLE Weight Bearing: Weight bearing as tolerated      Mobility  Bed Mobility               General bed mobility comments: In recliner upon arrival; reports no difficulty getting to EOB    Transfers Overall transfer level: Needs assistance Equipment used: Rolling walker (2 wheeled) Transfers: Sit to/from Stand Sit to Stand: Supervision         General transfer comment: supervision for safety  Ambulation/Gait Ambulation/Gait assistance: Min guard;Mod assist Gait Distance (Feet): 15  Feet Assistive device: Rolling walker (2 wheeled) Gait Pattern/deviations: Decreased step length - left;Decreased stance time - right Gait velocity: slowed   General Gait Details: Cues to active quad for R knee stance stability; one episode of loss of balance while turning to the stairs; mod assist and holding gait belt to prevent fall; Daughter present and understands to stay close to pt when up and walking , and especially with turns  Stairs Stairs: Yes Stairs assistance: Min guard Stair Management: One rail Right;Step to pattern;Forwards Number of Stairs: 5 General stair comments: Cues for sequence; good use of rail for stabiltiy  Wheelchair Mobility    Modified Rankin (Stroke Patients Only)       Balance Overall balance assessment: Needs assistance Sitting-balance support: No upper extremity supported;Feet supported Sitting balance-Leahy Scale: Good     Standing balance support: Bilateral upper extremity supported;During functional activity;No upper extremity supported Standing balance-Leahy Scale: Fair                               Pertinent Vitals/Pain Pain Assessment: Faces Faces Pain Scale: Hurts a little bit Pain Location: R knee Pain Descriptors / Indicators: Grimacing Pain Intervention(s): Monitored during session    Home Living Family/patient expects to be discharged to:: Private residence Living Arrangements: Spouse/significant other Available Help at Discharge: Family;Available 24 hours/day Type of Home: House Home Access: Stairs to enter Entrance Stairs-Rails: Right;Can reach both;Left Entrance Stairs-Number of Steps: 5 Home Layout: One level  Home Equipment: Grab bars - tub/shower;Shower seat      Prior Function Level of Independence: Independent         Comments: RW prn     Hand Dominance   Dominant Hand: Right    Extremity/Trunk Assessment   Upper Extremity Assessment Upper Extremity Assessment: Defer to OT evaluation     Lower Extremity Assessment Lower Extremity Assessment: RLE deficits/detail RLE Deficits / Details: Grossly decr AROM and strength, with anticipated pain and soreness postop    Cervical / Trunk Assessment Cervical / Trunk Assessment: Normal  Communication   Communication: No difficulties  Cognition Arousal/Alertness: Awake/alert Behavior During Therapy: WFL for tasks assessed/performed Overall Cognitive Status: Within Functional Limits for tasks assessed                                        General Comments General comments (skin integrity, edema, etc.): Daughter and husband present; Daughter is a Marine scientist, and intends to stay with pt as long as necessary; provided tp and family with gait belt    Exercises     Assessment/Plan    PT Assessment Patient needs continued PT services  PT Problem List Decreased strength;Decreased range of motion;Decreased activity tolerance;Decreased balance;Decreased coordination;Decreased knowledge of use of DME;Pain       PT Treatment Interventions DME instruction;Gait training;Stair training;Functional mobility training;Therapeutic activities;Therapeutic exercise;Balance training;Patient/family education    PT Goals (Current goals can be found in the Care Plan section)  Acute Rehab PT Goals Patient Stated Goal: Go home PT Goal Formulation: With patient Time For Goal Achievement: 02/27/21 Potential to Achieve Goals: Good    Frequency 7X/week   Barriers to discharge        Co-evaluation               AM-PAC PT "6 Clicks" Mobility  Outcome Measure Help needed turning from your back to your side while in a flat bed without using bedrails?: A Little Help needed moving from lying on your back to sitting on the side of a flat bed without using bedrails?: A Little Help needed moving to and from a bed to a chair (including a wheelchair)?: A Little Help needed standing up from a chair using your arms (e.g., wheelchair or  bedside chair)?: A Little Help needed to walk in hospital room?: A Little Help needed climbing 3-5 steps with a railing? : A Little 6 Click Score: 18    End of Session Equipment Utilized During Treatment: Gait belt Activity Tolerance: Patient tolerated treatment well Patient left: in chair;with call bell/phone within reach;with family/visitor present Nurse Communication: Mobility status;Other (comment) (Ok for [employer]) PT Visit Diagnosis: Unsteadiness on feet (R26.81)    Time: 1023-1050 PT Time Calculation (min) (ACUTE ONLY): 27 min   Charges:   PT Evaluation $PT Eval Low Complexity: 1 Low PT Treatments $Gait Training: 8-22 mins        Roney Marion, PT  Acute Rehabilitation Services Pager [phone] Office [phone]   [place] 02/20/2021, 12:21 PM

## 2021-02-20 NOTE — Evaluation (Signed)
Occupational Therapy Evaluation Patient Details Name: Erin Wong MRN: [id number] DOB: 1951/03/12 Today's Date: 02/20/2021    History of Present Illness 70 yo female s/p R TKA on 6/21. PMH including arthritis, asthma, A fib, HTN, CHF, CKD stage 3, and pneumonia.   Clinical Impression   PTA, pt was living with her husband and daughter and was independent. Currently, pt requires Supervision for ADLs and functional mobility using RW. Provided education on LB ADLs, toilet transfer, and tub transfer with shower seat; pt demonstrated understanding. Answered all pt questions. Recommend dc home once medically stable per physician. All acute OT needs met and will sign off. Thank you.    Follow Up Recommendations  No OT follow up;Supervision/Assistance - 24 hour    Equipment Recommendations  None recommended by OT    Recommendations for Other Services       Precautions / Restrictions Precautions Precautions: Knee Precaution Booklet Issued: No Precaution Comments: Reviewed knee precautions and compensatory techniques for ADLs Restrictions Weight Bearing Restrictions: Yes RLE Weight Bearing: Weight bearing as tolerated      Mobility Bed Mobility               General bed mobility comments: In recliner upon arrival    Transfers Overall transfer level: Needs assistance Equipment used: Rolling walker (2 wheeled) Transfers: Sit to/from Stand Sit to Stand: Supervision         General transfer comment: supervision for safety    Balance Overall balance assessment: Needs assistance Sitting-balance support: No upper extremity supported;Feet supported Sitting balance-Leahy Scale: Good     Standing balance support: Bilateral upper extremity supported;During functional activity;No upper extremity supported Standing balance-Leahy Scale: Fair                             ADL either performed or assessed with clinical judgement   ADL Overall ADL's : Needs  assistance/impaired                                       General ADL Comments: Pt performing at [place] level. PRoviding education on LB dressing, toileting, and tub transfer. Pt demonstrating understanding.     Vision         Perception     Praxis      Pertinent Vitals/Pain Pain Assessment: Faces Faces Pain Scale: No hurt Pain Intervention(s): Monitored during session     Hand Dominance Right   Extremity/Trunk Assessment Upper Extremity Assessment Upper Extremity Assessment: Overall WFL for tasks assessed   Lower Extremity Assessment Lower Extremity Assessment: Defer to PT evaluation;RLE deficits/detail RLE Deficits / Details: s/p TKA   Cervical / Trunk Assessment Cervical / Trunk Assessment: Normal   Communication Communication Communication: No difficulties   Cognition Arousal/Alertness: Awake/alert Behavior During Therapy: WFL for tasks assessed/performed Overall Cognitive Status: Within Functional Limits for tasks assessed                                     General Comments  Daughter and husband present    Exercises     Shoulder Instructions      Home Living Family/patient expects to be discharged to:: Private residence Living Arrangements: Spouse/significant other Available Help at Discharge: Family;Available 24 hours/day Type of Home: House Home Access: Stairs to enter [employer]  of Steps: 5 Entrance Stairs-Rails: Right;Can reach both;Left Home Layout: One level     Bathroom Shower/Tub: Teacher, early years/pre: Standard     Home Equipment: Grab bars - tub/shower;Shower seat          Prior Functioning/Environment Level of Independence: Independent                 OT Problem List: Decreased range of motion;Decreased knowledge of use of DME or AE;Decreased knowledge of precautions      OT Treatment/Interventions:      OT Goals(Current goals can be found in the care plan  section) Acute Rehab OT Goals Patient Stated Goal: Go home OT Goal Formulation: All assessment and education complete, DC therapy  OT Frequency:     Barriers to D/C:            Co-evaluation              AM-PAC OT "6 Clicks" Daily Activity     Outcome Measure Help from another person eating meals?: None Help from another person taking care of personal grooming?: None Help from another person toileting, which includes using toliet, bedpan, or urinal?: A Little Help from another person bathing (including washing, rinsing, drying)?: A Little Help from another person to put on and taking off regular upper body clothing?: None Help from another person to put on and taking off regular lower body clothing?: A Little 6 Click Score: 21   End of Session Equipment Utilized During Treatment: Rolling walker Nurse Communication: Mobility status  Activity Tolerance: Patient tolerated treatment well Patient left: in chair;with call bell/phone within reach;with family/visitor present  OT Visit Diagnosis: Unsteadiness on feet (R26.81);Other abnormalities of gait and mobility (R26.89);Muscle weakness (generalized) (M62.81)                Time: 9323-5573 OT Time Calculation (min): 17 min Charges:  OT General Charges $OT Visit: 1 Visit OT Evaluation $OT Eval Low Complexity: [place], OTR/L Acute Rehab Pager: [phone] Office: [place] 02/20/2021, 9:33 AM

## 2021-02-20 NOTE — Discharge Summary (Signed)
Patient ID: Erin Wong MRN: [id number] DOB/AGE: 1951/02/24 70 y.o.  Admit date: 02/19/2021 Discharge date: 02/20/2021  Admission Diagnoses:  Principal Problem:   Post-traumatic osteoarthritis of right knee Active Problems:   Status post total right knee replacement   Discharge Diagnoses:  Same  Past Medical History:  Diagnosis Date   Allergy    Arthritis    Asthma    Atrial fibrillation ([place])    Benign essential HTN    CHF (congestive heart failure) (HCC)    CKD (chronic kidney disease) stage 3, GFR 30-59 ml/min (HCC)    Dysrhythmia     Afib -  had Ablation   GERD (gastroesophageal reflux disease)    HLD (hyperlipidemia)    HTN (hypertension)    Pneumonia     Surgeries: Procedure(s): RIGHT TOTAL KNEE ARTHROPLASTY on 02/19/2021   Consultants:   Discharged Condition: Improved  Hospital Course: Erin Wong is an 70 y.o. female who was admitted 02/19/2021 for operative treatment ofPost-traumatic osteoarthritis of right knee. Patient has severe unremitting pain that affects sleep, daily activities, and work/hobbies. After pre-op clearance the patient was taken to the operating room on 02/19/2021 and underwent  Procedure(s): RIGHT TOTAL KNEE ARTHROPLASTY.    Patient was given perioperative antibiotics:  Anti-infectives (From admission, onward)    Start     Dose/Rate Route Frequency Ordered Stop   02/19/21 2200  ceFAZolin (ANCEF) IVPB 2g/100 mL premix        2 g 200 mL/hr over 30 Minutes Intravenous Every 8 hours 02/19/21 1932 02/20/21 0601   02/19/21 1645  vancomycin (VANCOCIN) powder  Status:  Discontinued          As needed 02/19/21 1645 02/19/21 1728   02/19/21 1336  ceFAZolin (ANCEF) 2-4 GM/100ML-% IVPB       Note to Pharmacy: [place], [place]   : cabinet override      02/19/21 1336 02/20/21 0144   02/19/21 1330  ceFAZolin (ANCEF) IVPB 2g/100 mL premix  Status:  Discontinued        2 g 200 mL/hr over 30 Minutes Intravenous On call to O.R. 02/19/21 1316 02/19/21  1931        Patient was given sequential compression devices, early ambulation, and chemoprophylaxis to prevent DVT.  Patient benefited maximally from hospital stay and there were no complications.    Recent vital signs: Patient Vitals for the past 24 hrs:  BP Temp Temp src Pulse Resp SpO2 Height Weight  02/20/21 0351 (!) 145/62 97.8 F (36.6 C) Oral (!) 59 16 99 % -- --  02/20/21 0001 118/61 97.8 F (36.6 C) Oral 66 18 95 % -- --  02/19/21 1935 (!) 161/65 (!) 97.5 F (36.4 C) Oral (!) 49 18 100 % -- --  02/19/21 1919 (!) 143/91 -- -- (!) 50 12 98 % -- --  02/19/21 1904 (!) 153/69 -- -- (!) 48 11 100 % -- --  02/19/21 1849 (!) 156/61 -- -- (!) 49 14 100 % -- --  02/19/21 1834 139/61 -- -- (!) 49 15 99 % -- --  02/19/21 1819 136/70 -- -- (!) 49 14 100 % -- --  02/19/21 1815 -- -- -- (!) 47 12 99 % -- --  02/19/21 1804 (!) 138/57 (!) 97.5 F (36.4 C) -- (!) 48 10 100 % -- --  02/19/21 1749 131/68 -- -- (!) 47 15 100 % -- --  02/19/21 1734 112/64 (!) 97.4 F (36.3 C) -- (!) 49 13 94 % -- --  02/19/21 1425 (!) 143/54 -- -- (!) 52 (!) 21 100 % -- --  02/19/21 1420 (!) 152/53 -- -- (!) 52 14 100 % -- --  02/19/21 1327 (!) 166/72 97.9 F (36.6 C) Oral (!) 54 18 99 % 5\' 2"  (1.575 m) 64 kg     Recent laboratory studies: No results for input(s): WBC, HGB, HCT, PLT, NA, K, CL, CO2, BUN, CREATININE, GLUCOSE, INR, CALCIUM in the last 72 hours.  Invalid input(s): PT, 2   Discharge Medications:   Allergies as of 02/20/2021   No Known Allergies      Medication List     TAKE these medications    albuterol 108 (90 Base) MCG/ACT inhaler Commonly known as: VENTOLIN HFA Inhale 1 puff into the lungs every 6 (six) hours as needed for wheezing or shortness of breath.   apixaban 5 MG Tabs tablet Commonly known as: Eliquis Take 1 tablet (5 mg total) by mouth 2 (two) times daily.   atorvastatin 40 MG tablet Commonly known as: LIPITOR Take 1 tablet (40 mg total) by mouth daily.    benazepril 20 MG tablet Commonly known as: LOTENSIN Take 1 tablet (20 mg total) by mouth daily.   Biotin 5000 MCG Tabs Take 5,000 mcg by mouth daily.   CALCIUM GLUCONATE PO Take 600 mg by mouth daily.   carvedilol 3.125 MG tablet Commonly known as: COREG Take 1 tablet (3.125 mg total) by mouth 2 (two) times daily.   Cholecalciferol 25 MCG (1000 UT) tablet Take 1 tablet (1,000 Units total) by mouth daily.   docusate sodium 100 MG capsule Commonly known as: Colace Take 1 capsule (100 mg total) by mouth daily as needed.   fluticasone 50 MCG/ACT nasal spray Commonly known as: FLONASE Place 2 sprays into both nostrils daily as needed for allergies.   fluticasone-salmeterol 100-50 MCG/ACT Aepb Commonly known as: ADVAIR Inhale 1 puff into the lungs 2 (two) times daily.   methocarbamol 500 MG tablet Commonly known as: Robaxin Take 1 tablet (500 mg total) by mouth 2 (two) times daily as needed. To be taken after surgery   ondansetron 4 MG tablet Commonly known as: Zofran Take 1 tablet (4 mg total) by mouth every 8 (eight) hours as needed for nausea or vomiting.   oxyCODONE-acetaminophen 5-325 MG tablet Commonly known as: Percocet Take 1-2 tablets by mouth every 6 (six) hours as needed. To be taken after surgery   polyethylene glycol 17 g packet Commonly known as: MIRALAX / GLYCOLAX Take 17 g by mouth daily.       ASK your doctor about these medications    amiodarone 200 MG tablet Commonly known as: PACERONE Take 1/2 tablet daily   furosemide 40 MG tablet Commonly known as: LASIX TAKE 1 TABLET BY MOUTH EVERY DAY   omeprazole 20 MG capsule Commonly known as: PRILOSEC TAKE 1 CAPSULE BY MOUTH EVERY DAY               Durable Medical Equipment  (From admission, onward)           Start     Ordered   02/19/21 1933  DME Walker rolling  Once       Question Answer Comment  Walker: With 5 Inch Wheels   Patient needs a walker to treat with the following  condition Status post total right knee replacement      02/19/21 1932   02/19/21 1933  DME 3 n 1  Once  02/19/21 1932   02/19/21 1933  DME Bedside commode  Once       Question:  Patient needs a bedside commode to treat with the following condition  Answer:  Status post total right knee replacement   02/19/21 1932            Diagnostic Studies: DG Knee Right Port  Result Date: 02/19/2021 CLINICAL DATA:  70 year old female status post right knee arthroplasty. EXAM: PORTABLE RIGHT KNEE - 1-2 VIEW COMPARISON:  Radiograph dated 01/30/2021. FINDINGS: There is a total right knee arthroplasty. The arthroplasty components appear intact and in anatomic alignment. No acute fracture or dislocation. The bones are osteopenic. Vascular calcifications noted. Dressing noted overlie the knee. IMPRESSION: Status post total right knee arthroplasty. No acute fracture or dislocation. Electronically Signed   By: Anner Crete M.D.   On: 02/19/2021 18:55   XR KNEE 3 VIEW RIGHT  Result Date: 01/30/2021 Severe valgus deformity and posttraumatic arthritis  VAS [place] LOWER EXTREMITY VENOUS (DVT)  Result Date: 01/26/2021  Lower Venous DVT Study Patient Name:  RONAE NOELL  Date of Exam:   01/26/2021 Medical Rec #: [id number]      Accession #:    [id number] Date of Birth: Sep 01, 1951      Patient Gender: F Patient Age:   69Y Exam Location:  [facility] Procedure:      VAS [place] LOWER EXTREMITY VENOUS (DVT) Referring Phys: [id number] [place] --------------------------------------------------------------------------------  Indications: Swelling.  Risk Factors: Surgery Recent surgery to remove hardware from right knee. Comparison Study: No previous exams Performing Technologist: Rogelia Rohrer  Examination Guidelines: A complete evaluation includes B-mode imaging, spectral Doppler, color Doppler, and power Doppler as needed of all accessible portions of each vessel. Bilateral testing is considered an integral part of a  complete examination. Limited examinations for reoccurring indications may be performed as noted. The reflux portion of the exam is performed with the patient in reverse Trendelenburg.  +---------+---------------+---------+-----------+----------+--------------+ RIGHT    CompressibilityPhasicitySpontaneityPropertiesThrombus Aging +---------+---------------+---------+-----------+----------+--------------+ CFV      Full           Yes      Yes                                 +---------+---------------+---------+-----------+----------+--------------+ SFJ      Full                                                        +---------+---------------+---------+-----------+----------+--------------+ FV Prox  Full           Yes      Yes                                 +---------+---------------+---------+-----------+----------+--------------+ FV Mid   Full           Yes      Yes                                 +---------+---------------+---------+-----------+----------+--------------+ FV DistalFull           Yes      Yes                                 +---------+---------------+---------+-----------+----------+--------------+  PFV      Full                                                        +---------+---------------+---------+-----------+----------+--------------+ POP      Full           Yes      Yes                                 +---------+---------------+---------+-----------+----------+--------------+ PTV      Full                                                        +---------+---------------+---------+-----------+----------+--------------+ PERO     Full                                                        +---------+---------------+---------+-----------+----------+--------------+   +----+---------------+---------+-----------+----------+--------------+ LEFTCompressibilityPhasicitySpontaneityPropertiesThrombus Aging  +----+---------------+---------+-----------+----------+--------------+ CFV Full           Yes      Yes                                 +----+---------------+---------+-----------+----------+--------------+     Summary: RIGHT: - There is no evidence of deep vein thrombosis in the lower extremity. - There is no evidence of superficial venous thrombosis.  - No cystic structure found in the popliteal fossa.  LEFT: - No evidence of common femoral vein obstruction.  *See table(s) above for measurements and observations. Electronically signed by Monica Martinez MD on 01/26/2021 at 4:31:40 PM.    Final     Disposition: Discharge disposition: 01-Home or Self Care          Follow-up Information     Leandrew Koyanagi, MD. Go on 03/07/2021.   Specialty: Orthopedic Surgery Why: at 9:45 am for your 2 week in office appointment with Dr. Sherilyn Cooter information: [place] [id number] [phone]         [facility] Follow up.   Specialty: Rehabilitation Why: Your office case manager will assist in scheduling this appointmnet. Appointment currently pending. Contact information: [address] [place] [address] [phone]                 Signed: Aundra Dubin 02/20/2021, 7:58 AM

## 2021-02-21 ENCOUNTER — Other Ambulatory Visit: Payer: Self-pay | Admitting: *Deleted

## 2021-02-21 ENCOUNTER — Telehealth: Payer: Self-pay | Admitting: *Deleted

## 2021-02-21 DIAGNOSIS — I130 Hypertensive heart and chronic kidney disease with heart failure and stage 1 through stage 4 chronic kidney disease, or unspecified chronic kidney disease: Secondary | ICD-10-CM | POA: Diagnosis not present

## 2021-02-21 DIAGNOSIS — Z9181 History of falling: Secondary | ICD-10-CM | POA: Diagnosis not present

## 2021-02-21 DIAGNOSIS — Z471 Aftercare following joint replacement surgery: Secondary | ICD-10-CM | POA: Diagnosis not present

## 2021-02-21 DIAGNOSIS — E7800 Pure hypercholesterolemia, unspecified: Secondary | ICD-10-CM | POA: Diagnosis not present

## 2021-02-21 DIAGNOSIS — N183 Chronic kidney disease, stage 3 unspecified: Secondary | ICD-10-CM | POA: Diagnosis not present

## 2021-02-21 DIAGNOSIS — Z96651 Presence of right artificial knee joint: Secondary | ICD-10-CM

## 2021-02-21 DIAGNOSIS — Z7901 Long term (current) use of anticoagulants: Secondary | ICD-10-CM | POA: Diagnosis not present

## 2021-02-21 DIAGNOSIS — I4891 Unspecified atrial fibrillation: Secondary | ICD-10-CM | POA: Diagnosis not present

## 2021-02-21 DIAGNOSIS — I509 Heart failure, unspecified: Secondary | ICD-10-CM | POA: Diagnosis not present

## 2021-02-21 DIAGNOSIS — M1731 Unilateral post-traumatic osteoarthritis, right knee: Secondary | ICD-10-CM

## 2021-02-21 DIAGNOSIS — J45909 Unspecified asthma, uncomplicated: Secondary | ICD-10-CM | POA: Diagnosis not present

## 2021-02-21 DIAGNOSIS — K219 Gastro-esophageal reflux disease without esophagitis: Secondary | ICD-10-CM | POA: Diagnosis not present

## 2021-02-21 NOTE — Telephone Encounter (Signed)
Ortho bundle D/C call completed. 

## 2021-02-23 DIAGNOSIS — J45909 Unspecified asthma, uncomplicated: Secondary | ICD-10-CM | POA: Diagnosis not present

## 2021-02-23 DIAGNOSIS — I4891 Unspecified atrial fibrillation: Secondary | ICD-10-CM | POA: Diagnosis not present

## 2021-02-23 DIAGNOSIS — Z471 Aftercare following joint replacement surgery: Secondary | ICD-10-CM | POA: Diagnosis not present

## 2021-02-23 DIAGNOSIS — I509 Heart failure, unspecified: Secondary | ICD-10-CM | POA: Diagnosis not present

## 2021-02-23 DIAGNOSIS — N183 Chronic kidney disease, stage 3 unspecified: Secondary | ICD-10-CM | POA: Diagnosis not present

## 2021-02-23 DIAGNOSIS — I130 Hypertensive heart and chronic kidney disease with heart failure and stage 1 through stage 4 chronic kidney disease, or unspecified chronic kidney disease: Secondary | ICD-10-CM | POA: Diagnosis not present

## 2021-02-23 NOTE — Progress Notes (Signed)
Virtual Visit via Video Note   This visit type was conducted due to national recommendations for restrictions regarding the COVID-19 Pandemic (e.g. social distancing) in an effort to limit this patient's exposure and mitigate transmission in our community.  Due to her co-morbid illnesses, this patient is at least at moderate risk for complications without adequate follow up.  This format is felt to be most appropriate for this patient at this time.  All issues noted in this document were discussed and addressed.  A limited physical exam was performed with this format.  Please refer to the patient's chart for her consent to telehealth for [facility]   Patient Location: Home Physician Location: Office   Date:  03/09/2021    Erin Wong Date of Birth: May 18, 1951 Medical Record #[id number]  PCP:  [name], MD  Cardiologist:  Gillian Shields    No chief complaint on file.   History of Present Illness: 70 y.o. with history of PAF failed [facility] 01/17/20 and converted with amiodarone Tends to be bradycardic and both beta blocker and amiodarone doses have been cut back Holter 04/26/20 showed <1% PAF burden TTE 05/24/20 showed that her EF normalized in sinus to 60-65% moderate LAE and mild MR  She had repeat right knee surgery 12/04/20 by Dr Erlinda Hong for post traumatic arthritis With ORIF right bicondylar tibial plateau fracture with retained hardware which Was removed Eliquis held prior to procedure with no issues   She has been compliant with meds for HLD, HTN and uses albuterol for asthma   Felt poorly on narcotic pain meds. Some nausea Seen in ER 01/16/21 with nausea and atypical chest pain HR low R/O no acute ECG changes other than bradycardia and troponin negative x 2 01/19/21 coreg dose decreased   Amiodarone labs LFTls improved 02/15/21 normal TSH 2.1 09/06/20 PFTls 01/22/21 Only mild decrease in DLCO  Had right TKR on 02/19/21 by Dr Frankey Shown Husband has Had back and neck surgery  with Dr Arnoldo Morale as well   No cardiac issues      Past Medical History:  Diagnosis Date   Allergy    Arthritis    Asthma    Atrial fibrillation ([place])    Benign essential HTN    CHF (congestive heart failure) (HCC)    CKD (chronic kidney disease) stage 3, GFR 30-59 ml/min (HCC)    Dysrhythmia     Afib -  had Ablation   GERD (gastroesophageal reflux disease)    HLD (hyperlipidemia)    HTN (hypertension)    Pneumonia     Past Surgical History:  Procedure Laterality Date   ABDOMINAL HYSTERECTOMY  10/04/1995   Hysterectomy and Bilateral oophorectomy   CARDIOVERSION N/A 01/17/2020   Procedure: CARDIOVERSION;  Surgeon: [name], MD;  Location: [place];  Service: Cardiovascular;  Laterality: N/A;   COLON SURGERY  09/2009   hole in colon   FRACTURE SURGERY Right 08/02/2011   Right Leg--Plates & Screws   HAND SURGERY Right 09/02/2006   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--Arthritis   HARDWARE REMOVAL Right 12/04/2020   Procedure: rigth knee hardware removal;  Surgeon: Leandrew Koyanagi, MD;  Location: [place];  Service: Orthopedics;  Laterality: Right;   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   at time of colon surgery   TEE WITHOUT CARDIOVERSION N/A 01/17/2020   Procedure: TRANSESOPHAGEAL ECHOCARDIOGRAM (TEE);  Surgeon: [name], MD;  Location: [place];  Service: Cardiovascular;  Laterality: N/A;   TOTAL KNEE ARTHROPLASTY  Right 02/19/2021   Procedure: RIGHT TOTAL KNEE ARTHROPLASTY;  Surgeon: Leandrew Koyanagi, MD;  Location: [place];  Service: Orthopedics;  Laterality: Right;     Medications: Current Meds  Medication Sig   albuterol (VENTOLIN HFA) 108 (90 Base) MCG/ACT inhaler Inhale 1 puff into the lungs every 6 (six) hours as needed for wheezing or shortness of breath.   amiodarone (PACERONE) 200 MG tablet Take 1/2 tablet daily (Patient taking differently: Take 100 mg by mouth daily. Take 1/2 tablet daily)   apixaban (ELIQUIS) 5 MG TABS tablet Take 1 tablet (5 mg total) by mouth 2 (two)  times daily.   atorvastatin (LIPITOR) 40 MG tablet Take 1 tablet (40 mg total) by mouth daily.   benazepril (LOTENSIN) 20 MG tablet Take 1 tablet (20 mg total) by mouth daily.   Biotin 5000 MCG TABS Take 5,000 mcg by mouth daily.   CALCIUM GLUCONATE PO Take 600 mg by mouth daily.   carvedilol (COREG) 3.125 MG tablet Take 1 tablet (3.125 mg total) by mouth 2 (two) times daily.   cephALEXin (KEFLEX) 500 MG capsule Take 1 capsule (500 mg total) by mouth 4 (four) times daily.   Cholecalciferol 25 MCG (1000 UT) tablet Take 1 tablet (1,000 Units total) by mouth daily.   docusate sodium (COLACE) 100 MG capsule Take 1 capsule (100 mg total) by mouth daily as needed.   fluticasone (FLONASE) 50 MCG/ACT nasal spray Place 2 sprays into both nostrils daily as needed for allergies.   fluticasone-salmeterol (ADVAIR) 100-50 MCG/ACT AEPB Inhale 1 puff into the lungs 2 (two) times daily.   furosemide (LASIX) 40 MG tablet TAKE 1 TABLET BY MOUTH EVERY DAY (Patient taking differently: Take 40 mg by mouth daily.)   omeprazole (PRILOSEC) 20 MG capsule TAKE 1 CAPSULE BY MOUTH EVERY DAY (Patient taking differently: Take 20 mg by mouth daily.)   oxyCODONE-acetaminophen (PERCOCET) 5-325 MG tablet Take 1 tablet by mouth every 6 (six) hours as needed. To be taken after surgery   polyethylene glycol (MIRALAX / GLYCOLAX) 17 g packet Take 17 g by mouth daily.     Allergies: No Known Allergies  Social History: The patient  reports that she quit smoking about 30 years ago. Her smoking use included cigarettes. She has never used smokeless tobacco. She reports previous alcohol use. She reports that she does not use drugs.   Family History: The patient's family history includes Alcohol abuse in her brother and father; Arthritis in her mother; Asthma in her maternal aunt; Breast cancer in her sister; COPD in her brother; Diabetes in her maternal aunt; Esophageal cancer in her mother; Kidney cancer in her brother; Lung cancer in  her mother; Miscarriages / Stillbirths in her mother.   Review of Systems: Please see the history of present illness.   All other systems are reviewed and negative.   Physical Exam: VS:  BP 120/60   Pulse 66   Temp 98.2 F (36.8 C)   Ht 5\' 2"  (1.575 m)   Wt 63.5 kg   SpO2 95%   BMI 25.61 kg/m  .  BMI Body mass index is 25.61 kg/m.  Wt Readings from Last 3 Encounters:  03/09/21 63.5 kg  02/19/21 64 kg  02/15/21 64.7 kg   Telephone no exam    LABORATORY DATA:  EKG:    SB rate 54 06/27/20    Lab Results  Component Value Date   WBC 9.9 02/15/2021   HGB 13.0 02/15/2021   HCT 39.8 02/15/2021  PLT 391 02/15/2021   GLUCOSE 97 02/15/2021   CHOL 186 09/06/2020   TRIG 68 09/06/2020   HDL 89 09/06/2020   LDLCALC 84 09/06/2020   ALT 41 02/15/2021   AST 33 02/15/2021   NA 132 (L) 02/15/2021   K 4.2 02/15/2021   CL 96 (L) 02/15/2021   CREATININE 1.12 (H) 02/15/2021   BUN 20 02/15/2021   CO2 29 02/15/2021   TSH 2.170 09/06/2020   INR 1.4 (H) 02/15/2021     BNP (last 3 results) No results for input(s): BNP in the last 8760 hours.   ProBNP (last 3 results) No results for input(s): PROBNP in the last 8760 hours.   Other Studies Reviewed Today:  LIMITED ECHO IMPRESSIONS 05/2020   1. Left ventricular ejection fraction, by estimation, is 60 to 65%. The  left ventricle has normal function. The left ventricle has no regional  wall motion abnormalities. Left ventricular diastolic parameters are  consistent with Grade II diastolic  dysfunction (pseudonormalization).   2. Right ventricular systolic function is normal. The right ventricular  size is normal. There is normal pulmonary artery systolic pressure. The  estimated right ventricular systolic pressure is 83.3 mmHg.   3. Left atrial size was mild to moderately dilated.   4. The mitral valve is normal in structure. Mild mitral valve  regurgitation. No evidence of mitral stenosis.   5. The aortic valve is  tricuspid. Aortic valve regurgitation is trivial.  No aortic stenosis is present.   6. The inferior vena cava is normal in size with greater than 50%  respiratory variability, suggesting right atrial pressure of 3 mmHg.   Comparison(s): 01/14/20 EF 30-35%. PA pressure 86mmHg.     Monitor Study Highlights 04/2020   SR/SB average HR 51 bpm < 1% burden PAF PAC's < 1% total beats      Echocardiogram 01/14/2020: Impressions:  1. Left ventricular ejection fraction, by estimation, is 30 to 35%. The  left ventricle has moderately decreased function. The left ventricle  demonstrates global hypokinesis. The left ventricular internal cavity size  was mildly dilated. Left ventricular  diastolic parameters are indeterminate. There is akinesis of the left  ventricular, mid-apical inferior wall, anterior wall, inferolateral wall,  anterolateral wall and lateral wall. There is akinesis of the left  ventricular, apical septal wall and apical  segment.   2. Right ventricular systolic function is mildly reduced. The right  ventricular size is mildly enlarged. There is moderately elevated  pulmonary artery systolic pressure. The estimated right ventricular  systolic pressure is 82.5 mmHg.   3. Left atrial size was severely dilated.   4. The mitral valve is normal in structure. Mild to moderate mitral valve  regurgitation. No evidence of mitral stenosis.   5. Tricuspid valve regurgitation is mild to moderate.   6. The aortic valve is normal in structure. Aortic valve regurgitation is  trivial. No aortic stenosis is present.   7. The inferior vena cava is dilated in size with <50% respiratory  variability, suggesting right atrial pressure of 15 mmHg.          ASSESSMENT & PLAN:     1. Persistent AF - remains in NSR continue low dose amiodarone Recent labs and DLCO allright   2. Chronic anticoagulation -no issues holding prior to knee surgery no bleeding issues normal lytes and Hct  3. Chronic  systolic HF/NICM - tachy mediated normalized in sinus   4. HTN - Well controlled.  Continue current medications and low sodium  Dash type diet.    5. HLD - on statin - LDL 84 acceptable no known vascular dx   6. Ortho:  post right TKR 02/19/21 continue PT f/u Dr Erlinda Hong  Current medicines are reviewed with the patient today.  The patient does not have concerns regarding medicines other than what has been noted above.  The following changes have been made:  See above.  Labs/ tests ordered today include:    No orders of the defined types were placed in this encounter.  None   Time:  spent reviewing labs, PFTls recent admission for TKR direct patient interview and composing note 20 minutes  Disposition:   FU  In  6 months   Time:  spent reviewing chart and recent hospital stay for TKR labs, echo direct patient interview and composing note 22 minutes   Signed: Jenkins Rouge, MD  03/09/2021 8:36 AM  [facility] [address] [place], [place]  [address] Phone: [phone] Fax: [phone]

## 2021-02-26 ENCOUNTER — Other Ambulatory Visit: Payer: Self-pay | Admitting: Physician Assistant

## 2021-02-26 ENCOUNTER — Telehealth: Payer: Self-pay | Admitting: *Deleted

## 2021-02-26 DIAGNOSIS — Z471 Aftercare following joint replacement surgery: Secondary | ICD-10-CM | POA: Diagnosis not present

## 2021-02-26 DIAGNOSIS — I130 Hypertensive heart and chronic kidney disease with heart failure and stage 1 through stage 4 chronic kidney disease, or unspecified chronic kidney disease: Secondary | ICD-10-CM | POA: Diagnosis not present

## 2021-02-26 DIAGNOSIS — I509 Heart failure, unspecified: Secondary | ICD-10-CM | POA: Diagnosis not present

## 2021-02-26 DIAGNOSIS — J45909 Unspecified asthma, uncomplicated: Secondary | ICD-10-CM | POA: Diagnosis not present

## 2021-02-26 DIAGNOSIS — N183 Chronic kidney disease, stage 3 unspecified: Secondary | ICD-10-CM | POA: Diagnosis not present

## 2021-02-26 DIAGNOSIS — I4891 Unspecified atrial fibrillation: Secondary | ICD-10-CM | POA: Diagnosis not present

## 2021-02-26 MED ORDER — OXYCODONE-ACETAMINOPHEN 5-325 MG PO TABS: 1.0000 | ORAL_TABLET | Freq: Four times a day (QID) | ORAL | 0 refills | Status: DC | PRN

## 2021-02-26 NOTE — Telephone Encounter (Signed)
7 day call with patient. Doing well. Requesting refill of pain medication. Thanks.

## 2021-02-26 NOTE — Telephone Encounter (Signed)
Sounds good!  I refilled percocet

## 2021-02-27 ENCOUNTER — Ambulatory Visit: Payer: Medicare Other

## 2021-02-28 DIAGNOSIS — Z471 Aftercare following joint replacement surgery: Secondary | ICD-10-CM | POA: Diagnosis not present

## 2021-02-28 DIAGNOSIS — I509 Heart failure, unspecified: Secondary | ICD-10-CM | POA: Diagnosis not present

## 2021-02-28 DIAGNOSIS — J45909 Unspecified asthma, uncomplicated: Secondary | ICD-10-CM | POA: Diagnosis not present

## 2021-02-28 DIAGNOSIS — I130 Hypertensive heart and chronic kidney disease with heart failure and stage 1 through stage 4 chronic kidney disease, or unspecified chronic kidney disease: Secondary | ICD-10-CM | POA: Diagnosis not present

## 2021-02-28 DIAGNOSIS — N183 Chronic kidney disease, stage 3 unspecified: Secondary | ICD-10-CM | POA: Diagnosis not present

## 2021-02-28 DIAGNOSIS — I4891 Unspecified atrial fibrillation: Secondary | ICD-10-CM | POA: Diagnosis not present

## 2021-03-02 DIAGNOSIS — I130 Hypertensive heart and chronic kidney disease with heart failure and stage 1 through stage 4 chronic kidney disease, or unspecified chronic kidney disease: Secondary | ICD-10-CM | POA: Diagnosis not present

## 2021-03-02 DIAGNOSIS — N183 Chronic kidney disease, stage 3 unspecified: Secondary | ICD-10-CM | POA: Diagnosis not present

## 2021-03-02 DIAGNOSIS — I509 Heart failure, unspecified: Secondary | ICD-10-CM | POA: Diagnosis not present

## 2021-03-02 DIAGNOSIS — Z471 Aftercare following joint replacement surgery: Secondary | ICD-10-CM | POA: Diagnosis not present

## 2021-03-02 DIAGNOSIS — J45909 Unspecified asthma, uncomplicated: Secondary | ICD-10-CM | POA: Diagnosis not present

## 2021-03-02 DIAGNOSIS — I4891 Unspecified atrial fibrillation: Secondary | ICD-10-CM | POA: Diagnosis not present

## 2021-03-06 ENCOUNTER — Other Ambulatory Visit: Payer: Self-pay | Admitting: Family

## 2021-03-06 ENCOUNTER — Telehealth: Payer: Self-pay | Admitting: Orthopaedic Surgery

## 2021-03-06 DIAGNOSIS — I130 Hypertensive heart and chronic kidney disease with heart failure and stage 1 through stage 4 chronic kidney disease, or unspecified chronic kidney disease: Secondary | ICD-10-CM | POA: Diagnosis not present

## 2021-03-06 DIAGNOSIS — J45909 Unspecified asthma, uncomplicated: Secondary | ICD-10-CM | POA: Diagnosis not present

## 2021-03-06 DIAGNOSIS — N183 Chronic kidney disease, stage 3 unspecified: Secondary | ICD-10-CM | POA: Diagnosis not present

## 2021-03-06 DIAGNOSIS — Z471 Aftercare following joint replacement surgery: Secondary | ICD-10-CM | POA: Diagnosis not present

## 2021-03-06 DIAGNOSIS — I4891 Unspecified atrial fibrillation: Secondary | ICD-10-CM | POA: Diagnosis not present

## 2021-03-06 DIAGNOSIS — I509 Heart failure, unspecified: Secondary | ICD-10-CM | POA: Diagnosis not present

## 2021-03-06 MED ORDER — METHOCARBAMOL 500 MG PO TABS: 500.0000 mg | ORAL_TABLET | Freq: Two times a day (BID) | ORAL | 0 refills | Status: DC | PRN

## 2021-03-06 MED ORDER — OXYCODONE-ACETAMINOPHEN 5-325 MG PO TABS: 1.0000 | ORAL_TABLET | Freq: Four times a day (QID) | ORAL | 0 refills | Status: DC | PRN

## 2021-03-06 NOTE — Telephone Encounter (Signed)
Pt requesting refill on oxycodone and would like to know if she could get some muscle relaxer  CB [phone]

## 2021-03-06 NOTE — Addendum Note (Signed)
Addended by: Dondra Prader R on: 03/06/2021 11:28 AM   Modules accepted: Orders

## 2021-03-06 NOTE — Telephone Encounter (Signed)
Can you please advise since Dr. Eileen Stanford are out of the office?

## 2021-03-07 ENCOUNTER — Ambulatory Visit (INDEPENDENT_AMBULATORY_CARE_PROVIDER_SITE_OTHER): Payer: Medicare Other | Admitting: Rehabilitative and Restorative Service Providers"

## 2021-03-07 ENCOUNTER — Other Ambulatory Visit: Payer: Self-pay

## 2021-03-07 ENCOUNTER — Ambulatory Visit (INDEPENDENT_AMBULATORY_CARE_PROVIDER_SITE_OTHER): Payer: Medicare Other | Admitting: Orthopaedic Surgery

## 2021-03-07 ENCOUNTER — Encounter: Payer: Self-pay | Admitting: Orthopaedic Surgery

## 2021-03-07 ENCOUNTER — Ambulatory Visit (INDEPENDENT_AMBULATORY_CARE_PROVIDER_SITE_OTHER): Payer: Medicare Other

## 2021-03-07 ENCOUNTER — Encounter: Payer: Self-pay | Admitting: Rehabilitative and Restorative Service Providers"

## 2021-03-07 DIAGNOSIS — M25561 Pain in right knee: Secondary | ICD-10-CM | POA: Diagnosis not present

## 2021-03-07 DIAGNOSIS — M6281 Muscle weakness (generalized): Secondary | ICD-10-CM | POA: Diagnosis not present

## 2021-03-07 DIAGNOSIS — R2689 Other abnormalities of gait and mobility: Secondary | ICD-10-CM | POA: Diagnosis not present

## 2021-03-07 DIAGNOSIS — Z96651 Presence of right artificial knee joint: Secondary | ICD-10-CM

## 2021-03-07 MED ORDER — CEPHALEXIN 500 MG PO CAPS: 500.0000 mg | ORAL_CAPSULE | Freq: Four times a day (QID) | ORAL | 0 refills | Status: DC

## 2021-03-07 MED ORDER — METHOCARBAMOL 500 MG PO TABS: 500.0000 mg | ORAL_TABLET | Freq: Two times a day (BID) | ORAL | 6 refills | Status: DC | PRN

## 2021-03-07 NOTE — Progress Notes (Signed)
Post-Op Visit Note   Patient: Erin Wong           Date of Birth: 1951/06/24           MRN: [id number] Visit Date: 03/07/2021 PCP: [name], MD   Assessment & Plan:  Chief Complaint:  Chief Complaint  Patient presents with   Right Knee - Pain   Visit Diagnoses:  1. Status post total right knee replacement     Plan: Erin Wong is a 70 y.o. female who follows up 2 weeks status post right total knee arthroplasty, date of surgery 02/19/21. She is doing well. Her pain is well controlled taking 1-2 Percocet every 6 hours. She is ambulating with a rolling walker and single point cane. She completed home therapy and will be starting outpatient physical therapy today. No problems with the incision. No fevers, chills or night sweats. She restarted her Eliquis the day after surgery.  Incision is clean, dry and intact without erythema, purulence or other signs of infection. ROM is 0-90 degrees. Stable varus and valgus stress. Distal neurovascular exam intact.  Deep and superficial peroneal nerve function intact.  5/5 strength ankle dorsiflexion  Erin Wong is doing well following her knee replacement. Her sutures were removed today. At this point she can get her incision wet in the shower but avoid soaking for another several weeks. Continue working with outpatient physical therapy. She was given an implant card today and we wrote for Robaxin and Keflex for 10 days. Follow up in 4 weeks with repeat x-rays of the right knee.   Follow-Up Instructions: Return in about 4 weeks (around 04/04/2021).   Orders:  Orders Placed This Encounter  Procedures   XR Knee 1-2 Views Right   No orders of the defined types were placed in this encounter.   Imaging: Radiographs of the right knee dated 03/07/21 were reviewed and demonstrate stable total knee arthroplasty in anatomic alignment.   PMFS History: Patient Active Problem List   Diagnosis Date Noted   Status post total right knee  replacement 02/19/2021   Post-traumatic osteoarthritis of right knee 12/04/2020   Positive colorectal cancer screening using Cologuard test 07/02/2020   History of colectomy 07/02/2020   Colon cancer screening 07/02/2020   CHF (congestive heart failure) (HCC)    Acute respiratory failure with hypoxia ([place]) 02/12/2020   Acute bronchitis 02/72/5366   Acute systolic heart failure ([place]) 01/19/2020   Atrial fibrillation with rapid ventricular response ([place]) 01/13/2020   CKD (chronic kidney disease) stage 3, GFR 30-59 ml/min (HCC)    Benign essential HTN    HLD (hyperlipidemia)    Hyperlipidemia 02/12/2017   AKI (acute kidney injury) ([place])    Chest pain 01/13/2017   Chest pressure    Acute kidney injury superimposed on CKD ([place])    Osteoporosis 10/18/2015   Vitamin D deficiency 07/20/2015   HTN (hypertension) 07/17/2015   GERD (gastroesophageal reflux disease) 01/12/2015   Chronic constipation 01/12/2015   History of colon surgery 01/12/2015   History of total hysterectomy with bilateral salpingo-oophorectomy (BSO) 01/12/2015   Allergy    Asthma    Past Medical History:  Diagnosis Date   Allergy    Arthritis    Asthma    Atrial fibrillation (HCC)    Benign essential HTN    CHF (congestive heart failure) (HCC)    CKD (chronic kidney disease) stage 3, GFR 30-59 ml/min (HCC)    Dysrhythmia     Afib -  had  Ablation   GERD (gastroesophageal reflux disease)    HLD (hyperlipidemia)    HTN (hypertension)    Pneumonia     Family History  Problem Relation Age of Onset   Arthritis Mother    Miscarriages / [place] Mother    Lung cancer Mother        Had quit smoking for 25 years   Esophageal cancer Mother    Alcohol abuse Father    Alcohol abuse Brother    Kidney cancer Brother    Breast cancer Sister    COPD Brother    Asthma Maternal Aunt    Diabetes Maternal Aunt    Colon cancer Neg Hx    Inflammatory bowel disease Neg Hx    Liver disease Neg Hx    Pancreatic cancer  Neg Hx    Rectal cancer Neg Hx    Stomach cancer Neg Hx     Past Surgical History:  Procedure Laterality Date   ABDOMINAL HYSTERECTOMY  10/04/1995   Hysterectomy and Bilateral oophorectomy   CARDIOVERSION N/A 01/17/2020   Procedure: CARDIOVERSION;  Surgeon: [name], MD;  [facility] ENDOSCOPY;  Service: Cardiovascular;  Laterality: N/A;   COLON SURGERY  09/2009   hole in colon   FRACTURE SURGERY Right 08/02/2011   Right Leg--Plates & Screws   HAND SURGERY Right 09/02/2006   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--Arthritis   HARDWARE REMOVAL Right 12/04/2020   Procedure: rigth knee hardware removal;  Surgeon: Leandrew Koyanagi, MD;  Location: [place];  Service: Orthopedics;  Laterality: Right;   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   at time of colon surgery   TEE WITHOUT CARDIOVERSION N/A 01/17/2020   Procedure: TRANSESOPHAGEAL ECHOCARDIOGRAM (TEE);  Surgeon: [name], MD;  Location[facility] ENDOSCOPY;  Service: Cardiovascular;  Laterality: N/A;   TOTAL KNEE ARTHROPLASTY Right 02/19/2021   Procedure: RIGHT TOTAL KNEE ARTHROPLASTY;  Surgeon: Leandrew Koyanagi, MD;  Location: [place];  Service: Orthopedics;  Laterality: Right;   Social History   Occupational History   Occupation: retired  Tobacco Use   Smoking status: Former    Pack years: 0.00    Types: Cigarettes    Quit date: 01/10/1991    Years since quitting: 30.1   Smokeless tobacco: Never  Vaping Use   Vaping Use: Never used  Substance and Sexual Activity   Alcohol use: Not Currently    Comment: quit in May/2021   Drug use: No   Sexual activity: Never

## 2021-03-07 NOTE — Telephone Encounter (Signed)
Patient was seen in our office today.  

## 2021-03-07 NOTE — Patient Instructions (Signed)
Advised pt, due to time constraints of evaluation (pt arrived late to evaluation), to keep performing HHPT HEP at this time until next appt.

## 2021-03-07 NOTE — Therapy (Signed)
[place], [place], [address] Phone: [phone]   Fax:  [phone]  Physical Therapy Evaluation  Patient Details  Name: Erin Wong MRN: [id number] Date of Birth: August 19, 1951 Referring Provider (PT): Dr. Erlinda Hong, MD   Encounter Date: 03/07/2021   PT End of Session - 03/07/21 1151     Visit Number 1    Number of Visits 12    Date for PT Re-Evaluation 04/13/21    Authorization Type MCR    Progress Note Due on Visit 10    PT Start Time 1127    PT Stop Time 1151    PT Time Calculation (min) 24 min    Activity Tolerance Patient tolerated treatment well;No increased pain    Behavior During Therapy WFL for tasks assessed/performed             Past Medical History:  Diagnosis Date   Allergy    Arthritis    Asthma    Atrial fibrillation (HCC)    Benign essential HTN    CHF (congestive heart failure) (HCC)    CKD (chronic kidney disease) stage 3, GFR 30-59 ml/min (HCC)    Dysrhythmia     Afib -  had Ablation   GERD (gastroesophageal reflux disease)    HLD (hyperlipidemia)    HTN (hypertension)    Pneumonia     Past Surgical History:  Procedure Laterality Date   ABDOMINAL HYSTERECTOMY  10/04/1995   Hysterectomy and Bilateral oophorectomy   CARDIOVERSION N/A 01/17/2020   Procedure: CARDIOVERSION;  Surgeon: [name], MD;  Location: [facility];  Service: Cardiovascular;  Laterality: N/A;   COLON SURGERY  09/2009   hole in colon   FRACTURE SURGERY Right 08/02/2011   Right Leg--Plates & Screws   HAND SURGERY Right 09/02/2006   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--Arthritis   HARDWARE REMOVAL Right 12/04/2020   Procedure: rigth knee hardware removal;  Surgeon: Leandrew Koyanagi, MD;  Location: [facility];  Service: Orthopedics;  Laterality: Right;   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    Subjective Assessment - 03/07/21 1131     Subjective 7/10 pain R knee; got a good drs report today. i have a cpm and do the machine 6-8 and then i ice and take my medications. 1-2 more weeks on the cpm. HHPT discharged yesterday.    Pertinent History R TKR 02/19/21    Limitations Sitting;House hold activities;Standing;Walking    How long can you sit comfortably? 1 hour    How long can you stand comfortably? 10 min    How long can you walk comfortably? 15-20 min    Patient Stated Goals to be able to walk without a cane    Currently in Pain? Yes    Pain Score 7     Pain Location Knee    Pain Orientation Right;Anterior    Pain Descriptors / Indicators Aching    Pain Type Acute pain;Surgical pain    Pain Radiating Towards central knee    Pain Onset 1 to 4 weeks ago  Pain Frequency Constant    Aggravating Factors  sitting    Pain Relieving Factors moving    Multiple Pain Sites No                [facility] PT Assessment - 03/07/21 0001       Assessment   Medical Diagnosis R TKR    Referring Provider (PT) Dr. Erlinda Hong, MD    Onset Date/Surgical Date 02/19/21    Hand Dominance Right    Next MD Visit 04/04/21    Prior Therapy HHPT after surgery D/C 03/06/21      Precautions   Precautions None      Restrictions   Weight Bearing Restrictions No      Balance Screen   Has the patient fallen in the past 6 months No      [place] residence      Prior Function   Level of Independence Needs assistance with homemaking;Needs assistance with ADLs      Cognition   Overall Cognitive Status Within Functional Limits for tasks assessed      Observation/Other  Assessments-Edema    Edema --   pt has pitting edema throughout foot and calf; normally wears compression stocking but not donned at eval due to just leaving MD     Sensation   Light Touch Appears Intact      Coordination   Gross Motor Movements are Fluid and Coordinated Yes      Posture/Postural Control   Posture Comments ambulates and sits erect      ROM / Strength   AROM / PROM / Strength AROM;Strength      AROM   Overall AROM Comments L knee AROM WNL; R knee flex 90 degrees and ext -6 degrees measured supine      Strength   Overall Strength Comments L LE 5/5 except DF 4/5, R LE 4+/5 throughout      Palpation   Patella mobility still has steristrips on    Palpation comment no warmth palpated along R leg; however, pitting edema present leg to calf (see edema sxn)      Transfers   Comments transfers with and without hands with = knee flexion bil      Ambulation/Gait   Gait Comments currently amb with RW due to post surgical with slight heel to toe gait R LE                        Objective measurements completed on examination: See above findings.                 PT Short Term Goals - 03/07/21 1329       PT SHORT TERM GOAL #1   Title Pt will be indep with HEP    Baseline not issued at eval    Time 3    Period Weeks    Status New    Target Date 03/28/21      PT SHORT TERM GOAL #2   Title Pt will demo improved R knee flexion to 100 degrees to assist with greater ease with transfers    Baseline 90 degrees measured supine    Time 3    Period Weeks    Status New    Target Date 03/28/21      PT SHORT TERM GOAL #3   Title Pt will be able to perform 10 STS transfers without compensation for functional strengthening  Baseline x 2    Time 5    Period Weeks    Status New    Target Date 03/28/21               PT Long Term Goals - 03/07/21 1331       PT LONG TERM GOAL #1   Title Pt will be able to perform steps in most  comfortable fashion (reciprocal vs step to) with 1-2 rails with </= 3/10 R knee pain    Baseline step to only with difficulty    Time 5    Period Weeks    Status New    Target Date 04/13/21      PT LONG TERM GOAL #2   Title Pt will be able to step in/out of shower without assist of husband (if deemed safe)    Baseline needs husband assist    Time 5    Period Weeks    Status New    Target Date 04/13/21      PT LONG TERM GOAL #3   Title Pt will be able to stand x 30 min to assist with meal prep and fxnal activities    Baseline 5-10 min    Time 5    Period Weeks    Status New    Target Date 04/13/21      PT LONG TERM GOAL #4   Title Pt will be able to walk >/= 40 min without or with LRAD for community activity    Baseline uses walker and up to 20 min    Time 5    Period Weeks    Status New    Target Date 04/13/21      PT LONG TERM GOAL #5   Title Pt will be able to demo improved R knee flexion >/= 110 degrees    Baseline 90 degrees    Time 5    Period Weeks    Status New    Target Date 04/13/21      Additional Long Term Goals   Additional Long Term Goals Yes      PT LONG TERM GOAL #6   Title Pt will demo improved R LE strength 5/5 throughout    Baseline 4+/5 throughout    Time 5    Period Weeks    Status New    Target Date 04/13/21                    Plan - 03/07/21 1324     Clinical Impression Statement Pt arrived to PT late. Pt s/p R TKR 02/19/21. She demonstrates good knee AROM flex/ext and strength as evidenced by evaluation. She has limited heel to toe gait with usage of RW. Pt would benefit from further PT for R knee functional strengthening, knee flex/ext ROM therex and manual to assist as needed, and gait training to assist with energy efficiency. SLR lag was not assessed at eval    Personal Factors and Comorbidities Comorbidity 1;Comorbidity 2    Comorbidities R TKR 02/19/21, A Fib    Examination-Activity Limitations  Bathing;Squat;Stairs;Locomotion Level;Stand;Sit    Examination-Participation Restrictions Cleaning;Community Activity;Meal Prep;Driving    Stability/Clinical Decision Making Stable/Uncomplicated    Clinical Decision Making Low    Rehab Potential Excellent    PT Frequency 3x / week    PT Duration --   5 weeks   PT Treatment/Interventions Cryotherapy;Moist Heat;Electrical Stimulation;DME Instruction;Gait training;Stair training;Balance training;Therapeutic exercise;Therapeutic activities;Functional mobility training;Neuromuscular re-education;Patient/family education;Manual techniques;Scar mobilization;Passive range of  motion;Dry needling;Taping;Vasopneumatic Device    PT Next Visit Plan Review HHPT and issue new HEP as needed, R knee ROM/strengthening, GT, do FOTO (not performed at eval due to pt arriving late and receptionist being on lunch after session ended)    Consulted and Agree with Plan of Care Patient             Patient will benefit from skilled therapeutic intervention in order to improve the following deficits and impairments:  Abnormal gait, Decreased endurance, Decreased mobility, Difficulty walking, Increased edema, Decreased scar mobility, Decreased range of motion, Decreased activity tolerance, Decreased strength, Impaired flexibility, Pain  Visit Diagnosis: Acute pain of right knee  Muscle weakness (generalized)  Antalgic gait     Problem List Patient Active Problem List   Diagnosis Date Noted   Status post total right knee replacement 02/19/2021   Post-traumatic osteoarthritis of right knee 12/04/2020   Positive colorectal cancer screening using Cologuard test 07/02/2020   History of colectomy 07/02/2020   Colon cancer screening 07/02/2020   CHF (congestive heart failure) (HCC)    Acute respiratory failure with hypoxia ([place]) 02/12/2020   Acute bronchitis 50/11/7046   Acute systolic heart failure ([place]) 01/19/2020   Atrial fibrillation with rapid ventricular  response ([place]) 01/13/2020   CKD (chronic kidney disease) stage 3, GFR 30-59 ml/min (HCC)    Benign essential HTN    HLD (hyperlipidemia)    Hyperlipidemia 02/12/2017   AKI (acute kidney injury) ([place])    Chest pain 01/13/2017   Chest pressure    Acute kidney injury superimposed on CKD ([place])    Osteoporosis 10/18/2015   Vitamin D deficiency 07/20/2015   HTN (hypertension) 07/17/2015   GERD (gastroesophageal reflux disease) 01/12/2015   Chronic constipation 01/12/2015   History of colon surgery 01/12/2015   History of total hysterectomy with bilateral salpingo-oophorectomy (BSO) 01/12/2015   Allergy    Asthma     America Brown, PT, DPT 03/07/2021, 1:38 PM  [facility] [address] [place], [place], [address] Phone: [phone]   Fax:  [phone]  Name: Erin Wong MRN: [id number] Date of Birth: [birth year]

## 2021-03-08 ENCOUNTER — Encounter: Payer: Medicare Other | Admitting: Physical Therapy

## 2021-03-09 ENCOUNTER — Telehealth: Payer: Self-pay | Admitting: Orthopaedic Surgery

## 2021-03-09 ENCOUNTER — Telehealth (INDEPENDENT_AMBULATORY_CARE_PROVIDER_SITE_OTHER): Payer: Medicare Other | Admitting: Cardiovascular Disease

## 2021-03-09 ENCOUNTER — Other Ambulatory Visit: Payer: Self-pay

## 2021-03-09 VITALS — BP 120/60 | HR 66 | Temp 98.2°F | Ht 62.0 in | Wt 140.0 lb

## 2021-03-09 DIAGNOSIS — I10 Essential (primary) hypertension: Secondary | ICD-10-CM

## 2021-03-09 DIAGNOSIS — Z7901 Long term (current) use of anticoagulants: Secondary | ICD-10-CM | POA: Diagnosis not present

## 2021-03-09 DIAGNOSIS — I5022 Chronic systolic (congestive) heart failure: Secondary | ICD-10-CM

## 2021-03-09 DIAGNOSIS — I480 Paroxysmal atrial fibrillation: Secondary | ICD-10-CM

## 2021-03-09 DIAGNOSIS — E782 Mixed hyperlipidemia: Secondary | ICD-10-CM

## 2021-03-09 NOTE — Telephone Encounter (Signed)
Please advise 

## 2021-03-09 NOTE — Telephone Encounter (Signed)
Pt called requesting a refill of oxycodone. Please send to pharmacy on file. Patient phone number is [phone].

## 2021-03-09 NOTE — Patient Instructions (Signed)
Medication Instructions:  *If you need a refill on your cardiac medications before your next appointment, please call your pharmacy*   Lab Work: If you have labs (blood work) drawn today and your tests are completely normal, you will receive your results only by: MyChart Message (if you have MyChart) OR A paper copy in the mail If you have any lab test that is abnormal or we need to change your treatment, we will call you to review the results.  Follow-Up: At [facility], you and your health needs are our priority.  As part of our continuing mission to provide you with exceptional heart care, we have created designated Provider Care Teams.  These Care Teams include your primary Cardiologist (physician) and Advanced Practice Providers (APPs -  Physician Assistants and Nurse Practitioners) who all work together to provide you with the care you need, when you need it.  We recommend signing up for the patient portal called "MyChart".  Sign up information is provided on this After Visit Summary.  MyChart is used to connect with patients for Virtual Visits (Telemedicine).  Patients are able to view lab/test results, encounter notes, upcoming appointments, etc.  Non-urgent messages can be sent to your provider as well.   To learn more about what you can do with MyChart, go to [email or web address].    Your next appointment:   6 month(s)  The format for your next appointment:   In Person  Provider:   You may see Peter Nishan, MD or one of the following Advanced Practice Providers on your designated Care Team:   Laura Ingold, NP  

## 2021-03-12 ENCOUNTER — Ambulatory Visit (INDEPENDENT_AMBULATORY_CARE_PROVIDER_SITE_OTHER): Payer: Medicare Other | Admitting: Rehabilitative and Restorative Service Providers"

## 2021-03-12 ENCOUNTER — Other Ambulatory Visit: Payer: Self-pay

## 2021-03-12 ENCOUNTER — Telehealth: Payer: Self-pay | Admitting: Orthopaedic Surgery

## 2021-03-12 ENCOUNTER — Other Ambulatory Visit: Payer: Self-pay | Admitting: Physician Assistant

## 2021-03-12 ENCOUNTER — Encounter: Payer: Self-pay | Admitting: Rehabilitative and Restorative Service Providers"

## 2021-03-12 DIAGNOSIS — M25561 Pain in right knee: Secondary | ICD-10-CM

## 2021-03-12 DIAGNOSIS — R2689 Other abnormalities of gait and mobility: Secondary | ICD-10-CM

## 2021-03-12 DIAGNOSIS — M6281 Muscle weakness (generalized): Secondary | ICD-10-CM | POA: Diagnosis not present

## 2021-03-12 MED ORDER — OXYCODONE-ACETAMINOPHEN 5-325 MG PO TABS: 1.0000 | ORAL_TABLET | Freq: Three times a day (TID) | ORAL | 0 refills | Status: DC | PRN

## 2021-03-12 NOTE — Therapy (Signed)
[place], [place], [address] Phone: [phone]   Fax:  [phone]  Physical Therapy Treatment  Patient Details  Name: Erin Wong MRN: [id number] Date of Birth: 02/27/1951 Referring Provider (PT): Dr. Erlinda Hong, MD   Encounter Date: 03/12/2021   PT End of Session - 03/12/21 1439     Visit Number 2    Number of Visits 12    Date for PT Re-Evaluation 04/13/21    Authorization Type MCR    PT Start Time 0236    PT Stop Time 1696    PT Time Calculation (min) 42 min    Activity Tolerance Patient tolerated treatment well;No increased pain    Behavior During Therapy WFL for tasks assessed/performed             Past Medical History:  Diagnosis Date   Allergy    Arthritis    Asthma    Atrial fibrillation (HCC)    Benign essential HTN    CHF (congestive heart failure) (HCC)    CKD (chronic kidney disease) stage 3, GFR 30-59 ml/min (HCC)    Dysrhythmia     Afib -  had Ablation   GERD (gastroesophageal reflux disease)    HLD (hyperlipidemia)    HTN (hypertension)    Pneumonia     Past Surgical History:  Procedure Laterality Date   ABDOMINAL HYSTERECTOMY  10/04/1995   Hysterectomy and Bilateral oophorectomy   CARDIOVERSION N/A 01/17/2020   Procedure: CARDIOVERSION;  Surgeon: [name], MD;  Location: [facility];  Service: Cardiovascular;  Laterality: N/A;   COLON SURGERY  09/2009   hole in colon   FRACTURE SURGERY Right 08/02/2011   Right Leg--Plates & Screws   HAND SURGERY Right 09/02/2006   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--Arthritis   HARDWARE REMOVAL Right 12/04/2020   Procedure: rigth knee hardware removal;  Surgeon: Leandrew Koyanagi, MD;  Location: [facility];  Service: Orthopedics;  Laterality: Right;   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   at time of colon surgery   TEE WITHOUT CARDIOVERSION N/A 01/17/2020   Procedure: TRANSESOPHAGEAL ECHOCARDIOGRAM (TEE);  Surgeon: [name], MD;  Location: [facility] ENDOSCOPY;   Service: Cardiovascular;  Laterality: N/A;   TOTAL KNEE ARTHROPLASTY Right 02/19/2021   Procedure: RIGHT TOTAL KNEE ARTHROPLASTY;  Surgeon: Leandrew Koyanagi, MD;  Location: [place] [facility];  Service: Orthopedics;  Laterality: Right;    There were no vitals filed for this visit.   Subjective Assessment - 03/12/21 1443     Subjective doing good.    Currently in Pain? Yes    Pain Score 2     Pain Location Knee    Pain Orientation Right;Anterior    Pain Descriptors / Indicators Aching    Pain Type Acute pain;Surgical pain                               [facility] Adult PT Treatment/Exercise - 03/12/21 0001       Exercises   Exercises Knee/Hip      Knee/Hip Exercises: Aerobic   Nustep level 3 bil UE/LEs x 7 min with PT varying seat angle for increasing flexion/ext      Knee/Hip Exercises: Standing   Other Standing Knee Exercises STS 2x10 with varying angles of knee flexion on hi/lo table    Other Standing Knee Exercises mini squat x 15; R SLS at parallel bar with L SLR x 15, L hip abdct x 15  Knee/Hip Exercises: Supine   Other Supine Knee/Hip Exercises knee flexion iso with up to 40 sec hold x 6 reps; heel slides x 20; quad set/SLR x 20, VMO SLR x 15, hamstring dig x 20    Other Supine Knee/Hip Exercises bridge with glute set x 20      Knee/Hip Exercises: Sidelying   Other Sidelying Knee/Hip Exercises R unilat clam shell x 20 with glute set                    PT Education - 03/12/21 1518     Education Details discussed with pt importance of doing exercises with control and concentration on proper muscle facilitation; discussed proper hip, knee, ankle alignment with all STS transfers, discussed heel to toe gait and slowing down gait to focus on form. Pt to bring in HHPT exercise next visit; brought PT DC summary today instead    Person(s) Educated Patient    Methods Explanation    Comprehension Verbalized understanding              PT Short Term Goals -  03/12/21 1501       PT SHORT TERM GOAL #1   Title Pt will be indep with HEP    Status On-going      PT SHORT TERM GOAL #2   Title Pt will demo improved R knee flexion to 100 degrees to assist with greater ease with transfers    Status On-going      PT SHORT TERM GOAL #3   Title Pt will be able to perform 10 STS transfers without compensation for functional strengthening    Status On-going               PT Long Term Goals - 03/07/21 1331       PT LONG TERM GOAL #1   Title Pt will be able to perform steps in most comfortable fashion (reciprocal vs step to) with 1-2 rails with </= 3/10 R knee pain    Baseline step to only with difficulty    Time 5    Period Weeks    Status New    Target Date 04/13/21      PT LONG TERM GOAL #2   Title Pt will be able to step in/out of shower without assist of husband (if deemed safe)    Baseline needs husband assist    Time 5    Period Weeks    Status New    Target Date 04/13/21      PT LONG TERM GOAL #3   Title Pt will be able to stand x 30 min to assist with meal prep and fxnal activities    Baseline 5-10 min    Time 5    Period Weeks    Status New    Target Date 04/13/21      PT LONG TERM GOAL #4   Title Pt will be able to walk >/= 40 min without or with LRAD for community activity    Baseline uses walker and up to 20 min    Time 5    Period Weeks    Status New    Target Date 04/13/21      PT LONG TERM GOAL #5   Title Pt will be able to demo improved R knee flexion >/= 110 degrees    Baseline 90 degrees    Time 5    Period Weeks    Status New    Target  Date 04/13/21      Additional Long Term Goals   Additional Long Term Goals Yes      PT LONG TERM GOAL #6   Title Pt will demo improved R LE strength 5/5 throughout    Baseline 4+/5 throughout    Time 5    Period Weeks    Status New    Target Date 04/13/21                   Plan - 03/12/21 1458     Clinical Impression Statement Pt s/p R TKR 02/19/21.  She demonstrates good knee AROM flex/ext and strength. She has limited heel to toe gait with usage of RW. Pt would benefit from further PT for R knee functional strengthening, knee flex/ext ROM therex and manual to assist as needed, and gait training to assist with energy efficiency. - ext lag with SLR. Pt needs max verbal cues for decreasing cadence of exercises and for proper muscle facilitation. Pt brought last HHPT note and to bring HHPT exercises next visit.    PT Treatment/Interventions Cryotherapy;Moist Heat;Electrical Stimulation;DME Instruction;Gait training;Stair training;Balance training;Therapeutic exercise;Therapeutic activities;Functional mobility training;Neuromuscular re-education;Patient/family education;Manual techniques;Scar mobilization;Passive range of motion;Dry needling;Taping;Vasopneumatic Device    PT Next Visit Plan Review HHPT and issue new HEP as needed, R knee ROM/strengthening, GT    Consulted and Agree with Plan of Care Patient             Patient will benefit from skilled therapeutic intervention in order to improve the following deficits and impairments:  Abnormal gait, Decreased endurance, Decreased mobility, Difficulty walking, Increased edema, Decreased scar mobility, Decreased range of motion, Decreased activity tolerance, Decreased strength, Impaired flexibility, Pain  Visit Diagnosis: Acute pain of right knee  Muscle weakness (generalized)  Antalgic gait     Problem List Patient Active Problem List   Diagnosis Date Noted   Status post total right knee replacement 02/19/2021   Post-traumatic osteoarthritis of right knee 12/04/2020   Positive colorectal cancer screening using Cologuard test 07/02/2020   History of colectomy 07/02/2020   Colon cancer screening 07/02/2020   CHF (congestive heart failure) (HCC)    Acute respiratory failure with hypoxia (HCC) 02/12/2020   Acute bronchitis 56/81/2751   Acute systolic heart failure ([place]) 01/19/2020    Atrial fibrillation with rapid ventricular response ([place]) 01/13/2020   CKD (chronic kidney disease) stage 3, GFR 30-59 ml/min (HCC)    Benign essential HTN    HLD (hyperlipidemia)    Hyperlipidemia 02/12/2017   AKI (acute kidney injury) ([place])    Chest pain 01/13/2017   Chest pressure    Acute kidney injury superimposed on CKD ([place])    Osteoporosis 10/18/2015   Vitamin D deficiency 07/20/2015   HTN (hypertension) 07/17/2015   GERD (gastroesophageal reflux disease) 01/12/2015   Chronic constipation 01/12/2015   History of colon surgery 01/12/2015   History of total hysterectomy with bilateral salpingo-oophorectomy (BSO) 01/12/2015   Allergy    Asthma     America Brown, PT, DPT 03/12/2021, 3:20 PM  [facility] [address] [place], [place], [address] Phone: [phone]   Fax:  [phone]  Name: LAWANNA CECERE MRN: [id number] Date of Birth: [birth year]

## 2021-03-12 NOTE — Telephone Encounter (Signed)
Pt called requesting a refill of pain meds. Patient phone number is [phone].

## 2021-03-12 NOTE — Telephone Encounter (Signed)
sent 

## 2021-03-12 NOTE — Telephone Encounter (Signed)
LMOM

## 2021-03-14 ENCOUNTER — Other Ambulatory Visit: Payer: Self-pay

## 2021-03-14 ENCOUNTER — Ambulatory Visit (INDEPENDENT_AMBULATORY_CARE_PROVIDER_SITE_OTHER): Payer: Medicare Other | Admitting: Physical Therapy

## 2021-03-14 DIAGNOSIS — R2689 Other abnormalities of gait and mobility: Secondary | ICD-10-CM | POA: Diagnosis not present

## 2021-03-14 DIAGNOSIS — M25561 Pain in right knee: Secondary | ICD-10-CM | POA: Diagnosis not present

## 2021-03-14 DIAGNOSIS — M6281 Muscle weakness (generalized): Secondary | ICD-10-CM

## 2021-03-14 NOTE — Therapy (Signed)
[place], [place], [address] Phone: [phone]   Fax:  [phone]  Physical Therapy Treatment  Patient Details  Name: Erin Wong MRN: [id number] Date of Birth: 01-Mar-1951 Referring Provider (PT): Dr. Erlinda Hong, MD   Encounter Date: 03/14/2021   PT End of Session - 03/14/21 1147     Visit Number 3    Number of Visits 12    Date for PT Re-Evaluation 04/13/21    Authorization Type MCR    PT Start Time 1100    PT Stop Time 1140    PT Time Calculation (min) 40 min    Activity Tolerance Patient tolerated treatment well;No increased pain    Behavior During Therapy WFL for tasks assessed/performed             Past Medical History:  Diagnosis Date   Allergy    Arthritis    Asthma    Atrial fibrillation (HCC)    Benign essential HTN    CHF (congestive heart failure) (HCC)    CKD (chronic kidney disease) stage 3, GFR 30-59 ml/min (HCC)    Dysrhythmia     Afib -  had Ablation   GERD (gastroesophageal reflux disease)    HLD (hyperlipidemia)    HTN (hypertension)    Pneumonia     Past Surgical History:  Procedure Laterality Date   ABDOMINAL HYSTERECTOMY  10/04/1995   Hysterectomy and Bilateral oophorectomy   CARDIOVERSION N/A 01/17/2020   Procedure: CARDIOVERSION;  Surgeon: [name], MD;  Location: [facility];  Service: Cardiovascular;  Laterality: N/A;   COLON SURGERY  09/2009   hole in colon   FRACTURE SURGERY Right 08/02/2011   Right Leg--Plates & Screws   HAND SURGERY Right 09/02/2006   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--Arthritis   HARDWARE REMOVAL Right 12/04/2020   Procedure: rigth knee hardware removal;  Surgeon: Leandrew Koyanagi, MD;  Location: [facility];  Service: Orthopedics;  Laterality: Right;   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   at time of colon surgery   TEE WITHOUT CARDIOVERSION N/A 01/17/2020   Procedure: TRANSESOPHAGEAL ECHOCARDIOGRAM (TEE);  Surgeon: [name], MD;  Location: [facility];   Service: Cardiovascular;  Laterality: N/A;   TOTAL KNEE ARTHROPLASTY Right 02/19/2021   Procedure: RIGHT TOTAL KNEE ARTHROPLASTY;  Surgeon: Leandrew Koyanagi, MD;  Location: [facility];  Service: Orthopedics;  Laterality: Right;    There were no vitals filed for this visit.   Subjective Assessment - 03/14/21 1144     Subjective relays only mild knee pain and sorness    Pertinent History R TKR 02/19/21    Limitations Sitting;House hold activities;Standing;Walking    How long can you sit comfortably? 1 hour    How long can you stand comfortably? 10 min    How long can you walk comfortably? 15-20 min    Patient Stated Goals to be able to walk without a cane    Pain Onset 1 to 4 weeks ago                               [facility] Adult PT Treatment/Exercise - 03/14/21 0001       Ambulation/Gait   Gait Comments gait training with SPC cues for step length, knee flexion, and reciprocal step through pattern, able to progress to mod I after training      Knee/Hip Exercises: Stretches   Active Hamstring Stretch Right;3 reps;30 seconds  Active Hamstring Stretch Limitations seated    Knee: Self-Stretch Limitations seated tailgate stretch 10 sec X 10    Gastroc Stretch Both;3 reps;30 seconds    Gastroc Stretch Limitations slantboard      Knee/Hip Exercises: Aerobic   Nustep L4 X 8 min      Knee/Hip Exercises: Seated   Long Arc Quad Right;3 sets;10 reps    Long Arc Quad Weight 2 lbs.    Other Seated Knee/Hip Exercises seated SLR Rt 2X10    Sit to Sand 10 reps;without UE support      Manual Therapy   Manual therapy comments Rt knee PROM                      PT Short Term Goals - 03/12/21 1501       PT SHORT TERM GOAL #1   Title Pt will be indep with HEP    Status On-going      PT SHORT TERM GOAL #2   Title Pt will demo improved R knee flexion to 100 degrees to assist with greater ease with transfers    Status On-going      PT SHORT TERM GOAL #3   Title Pt  will be able to perform 10 STS transfers without compensation for functional strengthening    Status On-going               PT Long Term Goals - 03/07/21 1331       PT LONG TERM GOAL #1   Title Pt will be able to perform steps in most comfortable fashion (reciprocal vs step to) with 1-2 rails with </= 3/10 R knee pain    Baseline step to only with difficulty    Time 5    Period Weeks    Status New    Target Date 04/13/21      PT LONG TERM GOAL #2   Title Pt will be able to step in/out of shower without assist of husband (if deemed safe)    Baseline needs husband assist    Time 5    Period Weeks    Status New    Target Date 04/13/21      PT LONG TERM GOAL #3   Title Pt will be able to stand x 30 min to assist with meal prep and fxnal activities    Baseline 5-10 min    Time 5    Period Weeks    Status New    Target Date 04/13/21      PT LONG TERM GOAL #4   Title Pt will be able to walk >/= 40 min without or with LRAD for community activity    Baseline uses walker and up to 20 min    Time 5    Period Weeks    Status New    Target Date 04/13/21      PT LONG TERM GOAL #5   Title Pt will be able to demo improved R knee flexion >/= 110 degrees    Baseline 90 degrees    Time 5    Period Weeks    Status New    Target Date 04/13/21      Additional Long Term Goals   Additional Long Term Goals Yes      PT LONG TERM GOAL #6   Title Pt will demo improved R LE strength 5/5 throughout    Baseline 4+/5 throughout    Time 5    Period  Weeks    Status New    Target Date 04/13/21                   Plan - 03/14/21 1113     Clinical Impression Statement Session focused on gentle knee ROM and strength along with gait progression from RW to [facility] After gait training and cueing for technique she was able to perform ambulation mod I for 75 ft. I recommend she use SPC for in home ambulation but continue RW for outside of home ambulation for stafety.    Personal  Factors and Comorbidities Comorbidity 1;Comorbidity 2    Comorbidities R TKR 02/19/21, A Fib    Examination-Activity Limitations Bathing;Squat;Stairs;Locomotion Level;Stand;Sit    PT Treatment/Interventions Cryotherapy;Moist Heat;Electrical Stimulation;DME Instruction;Gait training;Stair training;Balance training;Therapeutic exercise;Therapeutic activities;Functional mobility training;Neuromuscular re-education;Patient/family education;Manual techniques;Scar mobilization;Passive range of motion;Dry needling;Taping;Vasopneumatic Device    PT Next Visit Plan issue new HEP in about 2 weeks as needed, R knee ROM/strengthening, GT    PT Home Exercise Plan HHPT HEP reviewed and still appropriate for another 2 weeks. (ankle pumps, quad sets, heel slides, SLR,SAQ,LAQ)    Consulted and Agree with Plan of Care Patient             Patient will benefit from skilled therapeutic intervention in order to improve the following deficits and impairments:  Abnormal gait, Decreased endurance, Decreased mobility, Difficulty walking, Increased edema, Decreased scar mobility, Decreased range of motion, Decreased activity tolerance, Decreased strength, Impaired flexibility, Pain  Visit Diagnosis: Acute pain of right knee  Muscle weakness (generalized)  Antalgic gait     Problem List Patient Active Problem List   Diagnosis Date Noted   Status post total right knee replacement 02/19/2021   Post-traumatic osteoarthritis of right knee 12/04/2020   Positive colorectal cancer screening using Cologuard test 07/02/2020   History of colectomy 07/02/2020   Colon cancer screening 07/02/2020   CHF (congestive heart failure) (HCC)    Acute respiratory failure with hypoxia (HCC) 02/12/2020   Acute bronchitis 41/58/3094   Acute systolic heart failure (HCC) 01/19/2020   Atrial fibrillation with rapid ventricular response ([place]) 01/13/2020   CKD (chronic kidney disease) stage 3, GFR 30-59 ml/min (HCC)    Benign  essential HTN    HLD (hyperlipidemia)    Hyperlipidemia 02/12/2017   AKI (acute kidney injury) ([place])    Chest pain 01/13/2017   Chest pressure    Acute kidney injury superimposed on CKD ([place])    Osteoporosis 10/18/2015   Vitamin D deficiency 07/20/2015   HTN (hypertension) 07/17/2015   GERD (gastroesophageal reflux disease) 01/12/2015   Chronic constipation 01/12/2015   History of colon surgery 01/12/2015   History of total hysterectomy with bilateral salpingo-oophorectomy (BSO) 01/12/2015   Allergy    Asthma     Silvestre Mesi 03/14/2021, 11:48 AM  [facility] [address] [place], [place], [address] Phone: [phone]   Fax:  [phone]  Name: Erin Wong MRN: [id number] Date of Birth: 07-17-1951

## 2021-03-16 ENCOUNTER — Ambulatory Visit (INDEPENDENT_AMBULATORY_CARE_PROVIDER_SITE_OTHER): Payer: Medicare Other | Admitting: Rehabilitative and Restorative Service Providers"

## 2021-03-16 ENCOUNTER — Encounter: Payer: Self-pay | Admitting: Rehabilitative and Restorative Service Providers"

## 2021-03-16 ENCOUNTER — Other Ambulatory Visit: Payer: Self-pay

## 2021-03-16 DIAGNOSIS — M6281 Muscle weakness (generalized): Secondary | ICD-10-CM | POA: Diagnosis not present

## 2021-03-16 DIAGNOSIS — R2689 Other abnormalities of gait and mobility: Secondary | ICD-10-CM

## 2021-03-16 DIAGNOSIS — M25561 Pain in right knee: Secondary | ICD-10-CM

## 2021-03-16 NOTE — Therapy (Signed)
[facility] [address] [place], [place], [address] Phone: [phone]   Fax:  [phone]  Physical Therapy Treatment  Patient Details  Name: Erin Wong MRN: [id number] Date of Birth: 27-Feb-1951 Referring Provider (PT): Dr. Erlinda Hong, MD   Encounter Date: 03/16/2021   PT End of Session - 03/16/21 1124     Visit Number 4    Number of Visits 12    Date for PT Re-Evaluation 04/13/21    Authorization Type MCR    Progress Note Due on Visit 10    PT Start Time 9735    PT Stop Time 1204    PT Time Calculation (min) 39 min    Activity Tolerance Patient tolerated treatment well    Behavior During Therapy [facility] for tasks assessed/performed             Past Medical History:  Diagnosis Date   Allergy    Arthritis    Asthma    Atrial fibrillation ([place])    Benign essential HTN    CHF (congestive heart failure) (HCC)    CKD (chronic kidney disease) stage 3, GFR 30-59 ml/min (HCC)    Dysrhythmia     Afib -  had Ablation   GERD (gastroesophageal reflux disease)    HLD (hyperlipidemia)    HTN (hypertension)    Pneumonia     Past Surgical History:  Procedure Laterality Date   ABDOMINAL HYSTERECTOMY  10/04/1995   Hysterectomy and Bilateral oophorectomy   CARDIOVERSION N/A 01/17/2020   Procedure: CARDIOVERSION;  Surgeon: [name], MD;  Location: [place];  Service: Cardiovascular;  Laterality: N/A;   COLON SURGERY  09/2009   hole in colon   FRACTURE SURGERY Right 08/02/2011   Right Leg--Plates & Screws   HAND SURGERY Right 09/02/2006   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--Arthritis   HARDWARE REMOVAL Right 12/04/2020   Procedure: rigth knee hardware removal;  Surgeon: Leandrew Koyanagi, MD;  Location: [place];  Service: Orthopedics;  Laterality: Right;   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   at time of colon surgery   TEE WITHOUT CARDIOVERSION N/A 01/17/2020   Procedure: TRANSESOPHAGEAL ECHOCARDIOGRAM (TEE);  Surgeon: [name], MD;  Location: [facility]  ENDOSCOPY;  Service: Cardiovascular;  Laterality: N/A;   TOTAL KNEE ARTHROPLASTY Right 02/19/2021   Procedure: RIGHT TOTAL KNEE ARTHROPLASTY;  Surgeon: Leandrew Koyanagi, MD;  Location: [place];  Service: Orthopedics;  Laterality: Right;    There were no vitals filed for this visit.   Subjective Assessment - 03/16/21 1127     Subjective Pt. indicated feeling bad spasms in front of knee and lower leg while trying to sleep last night.  Pt. stated no pain today.    Pertinent History R TKR 02/19/21    Limitations Sitting;House hold activities;Standing;Walking    Patient Stated Goals to be able to walk without a cane    Currently in Pain? No/denies    Pain Onset 1 to 4 weeks ago                [facility] PT Assessment - 03/16/21 0001       ROM / Strength   AROM / PROM / Strength PROM      AROM   AROM Assessment Site Knee    Right/Left Knee Right    Right Knee Extension -2   in LAQ   Right Knee Flexion 96   in supine heel slide     PROM   PROM Assessment Site Knee  Right/Left Knee Right    Right Knee Extension 0    Right Knee Flexion 100      Ambulation/Gait   Gait Comments Ambulated into clinic c SPC in Lt UE                           [facility] Adult PT Treatment/Exercise - 03/16/21 0001       Knee/Hip Exercises: Stretches   Knee: Self-Stretch Limitations tailgate stretch incorporated into LAQ exercise    Gastroc Stretch 30 seconds;3 reps;Both   incline board     Knee/Hip Exercises: Aerobic   Nustep Lvl 5 5 mins      Knee/Hip Exercises: Seated   Long Arc Quad 3 sets;10 reps   pause in extension and flexion on Rt.  Contralateral leg movement opposite   Long Arc Quad Weight 3 lbs.    Other Seated Knee/Hip Exercises seated SLR Rt 2X10    Sit to Sand without UE support;15 reps   18 inch chair, slow lowering     Manual Therapy   Manual therapy comments seated Rt knee flexion mobilization c movmeent (IR, distraction, flexion) c contralateral leg movement opposite                       PT Short Term Goals - 03/12/21 1501       PT SHORT TERM GOAL #1   Title Pt will be indep with HEP    Status On-going      PT SHORT TERM GOAL #2   Title Pt will demo improved R knee flexion to 100 degrees to assist with greater ease with transfers    Status On-going      PT SHORT TERM GOAL #3   Title Pt will be able to perform 10 STS transfers without compensation for functional strengthening    Status On-going               PT Long Term Goals - 03/07/21 1331       PT LONG TERM GOAL #1   Title Pt will be able to perform steps in most comfortable fashion (reciprocal vs step to) with 1-2 rails with </= 3/10 R knee pain    Baseline step to only with difficulty    Time 5    Period Weeks    Status New    Target Date 04/13/21      PT LONG TERM GOAL #2   Title Pt will be able to step in/out of shower without assist of husband (if deemed safe)    Baseline needs husband assist    Time 5    Period Weeks    Status New    Target Date 04/13/21      PT LONG TERM GOAL #3   Title Pt will be able to stand x 30 min to assist with meal prep and fxnal activities    Baseline 5-10 min    Time 5    Period Weeks    Status New    Target Date 04/13/21      PT LONG TERM GOAL #4   Title Pt will be able to walk >/= 40 min without or with LRAD for community activity    Baseline uses walker and up to 20 min    Time 5    Period Weeks    Status New    Target Date 04/13/21      PT LONG TERM GOAL #  5   Title Pt will be able to demo improved R knee flexion >/= 110 degrees    Baseline 90 degrees    Time 5    Period Weeks    Status New    Target Date 04/13/21      Additional Long Term Goals   Additional Long Term Goals Yes      PT LONG TERM GOAL #6   Title Pt will demo improved R LE strength 5/5 throughout    Baseline 4+/5 throughout    Time 5    Period Weeks    Status New    Target Date 04/13/21                   Plan - 03/16/21 1140      Clinical Impression Statement Pt. demonstrated marked improvement in tolerance and active/passive movement on Rt knee c contralateral knee movement opposite.  Pt. to continue to benefit from skilled PT services to progress knee flexion moblity gains and overall strength and movement coordination.  Rt ankle DF limited and can be improved to help improve ambulation.    Personal Factors and Comorbidities Comorbidity 1;Comorbidity 2    Comorbidities R TKR 02/19/21, A Fib    Examination-Activity Limitations Bathing;Squat;Stairs;Locomotion Level;Stand;Sit    PT Treatment/Interventions Cryotherapy;Moist Heat;Electrical Stimulation;DME Instruction;Gait training;Stair training;Balance training;Therapeutic exercise;Therapeutic activities;Functional mobility training;Neuromuscular re-education;Patient/family education;Manual techniques;Scar mobilization;Passive range of motion;Dry needling;Taping;Vasopneumatic Device    PT Next Visit Plan Rt knee flexion mobility gains(manual and ther ex), progress balance intervention    PT Home Exercise Plan HHPT HEP reviewed and still appropriate for another 2 weeks. (ankle pumps, quad sets, heel slides, SLR,SAQ,LAQ)    Consulted and Agree with Plan of Care Patient             Patient will benefit from skilled therapeutic intervention in order to improve the following deficits and impairments:  Abnormal gait, Decreased endurance, Decreased mobility, Difficulty walking, Increased edema, Decreased scar mobility, Decreased range of motion, Decreased activity tolerance, Decreased strength, Impaired flexibility, Pain  Visit Diagnosis: Acute pain of right knee  Muscle weakness (generalized)  Antalgic gait     Problem List Patient Active Problem List   Diagnosis Date Noted   Status post total right knee replacement 02/19/2021   Post-traumatic osteoarthritis of right knee 12/04/2020   Positive colorectal cancer screening using Cologuard test 07/02/2020    History of colectomy 07/02/2020   Colon cancer screening 07/02/2020   CHF (congestive heart failure) (HCC)    Acute respiratory failure with hypoxia ([place]) 02/12/2020   Acute bronchitis 02/40/9735   Acute systolic heart failure (HCC) 01/19/2020   Atrial fibrillation with rapid ventricular response ([place]) 01/13/2020   CKD (chronic kidney disease) stage 3, GFR 30-59 ml/min (HCC)    Benign essential HTN    HLD (hyperlipidemia)    Hyperlipidemia 02/12/2017   AKI (acute kidney injury) ([place])    Chest pain 01/13/2017   Chest pressure    Acute kidney injury superimposed on CKD ([place])    Osteoporosis 10/18/2015   Vitamin D deficiency 07/20/2015   HTN (hypertension) 07/17/2015   GERD (gastroesophageal reflux disease) 01/12/2015   Chronic constipation 01/12/2015   History of colon surgery 01/12/2015   History of total hysterectomy with bilateral salpingo-oophorectomy (BSO) 01/12/2015   Allergy    Asthma     Scot Jun, PT, DPT, OCS, ATC 03/16/21  11:58 AM    [facility] [address] [place], [place], [address] Phone: [phone]   Fax:  [phone]  Name: Erin Wong MRN: [id number] Date of Birth: 14-Oct-1950

## 2021-03-19 ENCOUNTER — Other Ambulatory Visit (HOSPITAL_COMMUNITY): Payer: Self-pay | Admitting: Physician Assistant

## 2021-03-19 ENCOUNTER — Ambulatory Visit (INDEPENDENT_AMBULATORY_CARE_PROVIDER_SITE_OTHER): Payer: Medicare Other | Admitting: Physical Therapy

## 2021-03-19 ENCOUNTER — Telehealth: Payer: Self-pay | Admitting: Orthopaedic Surgery

## 2021-03-19 ENCOUNTER — Other Ambulatory Visit: Payer: Self-pay

## 2021-03-19 DIAGNOSIS — R2689 Other abnormalities of gait and mobility: Secondary | ICD-10-CM | POA: Diagnosis not present

## 2021-03-19 DIAGNOSIS — M6281 Muscle weakness (generalized): Secondary | ICD-10-CM | POA: Diagnosis not present

## 2021-03-19 DIAGNOSIS — M25561 Pain in right knee: Secondary | ICD-10-CM

## 2021-03-19 NOTE — Telephone Encounter (Signed)
Pt called asking for a refill of her percocet 5-325 mg; and would like a CB when it's been sent in.   [phone]

## 2021-03-19 NOTE — Therapy (Signed)
[facility] Physical Therapy [address] [place], [place], [address] Phone: [phone]   Fax:  [phone]  Physical Therapy Treatment  Patient Details  Name: Erin Wong MRN: [id number] Date of Birth: 1950-09-27 Referring Provider (PT): Dr. Erlinda Hong, MD   Encounter Date: 03/19/2021   PT End of Session - 03/19/21 1126     Visit Number 5    Number of Visits 12    Date for PT Re-Evaluation 04/13/21    Authorization Type MCR    Progress Note Due on Visit 10    PT Start Time 1102    PT Stop Time 1142    PT Time Calculation (min) 40 min    Activity Tolerance Patient tolerated treatment well    Behavior During Therapy [facility] for tasks assessed/performed             Past Medical History:  Diagnosis Date   Allergy    Arthritis    Asthma    Atrial fibrillation ([place])    Benign essential HTN    CHF (congestive heart failure) (HCC)    CKD (chronic kidney disease) stage 3, GFR 30-59 ml/min (HCC)    Dysrhythmia     Afib -  had Ablation   GERD (gastroesophageal reflux disease)    HLD (hyperlipidemia)    HTN (hypertension)    Pneumonia     Past Surgical History:  Procedure Laterality Date   ABDOMINAL HYSTERECTOMY  10/04/1995   Hysterectomy and Bilateral oophorectomy   CARDIOVERSION N/A 01/17/2020   Procedure: CARDIOVERSION;  Surgeon: [name], MD;  Location: [place];  Service: Cardiovascular;  Laterality: N/A;   COLON SURGERY  09/2009   hole in colon   FRACTURE SURGERY Right 08/02/2011   Right Leg--Plates & Screws   HAND SURGERY Right 09/02/2006   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--Arthritis   HARDWARE REMOVAL Right 12/04/2020   Procedure: rigth knee hardware removal;  Surgeon: Leandrew Koyanagi, MD;  Location: [place];  Service: Orthopedics;  Laterality: Right;   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   at time of colon surgery   TEE WITHOUT CARDIOVERSION N/A 01/17/2020   Procedure: TRANSESOPHAGEAL ECHOCARDIOGRAM (TEE);  Surgeon: [name], MD;  Location: [facility]  ENDOSCOPY;  Service: Cardiovascular;  Laterality: N/A;   TOTAL KNEE ARTHROPLASTY Right 02/19/2021   Procedure: RIGHT TOTAL KNEE ARTHROPLASTY;  Surgeon: Leandrew Koyanagi, MD;  Location: [place];  Service: Orthopedics;  Laterality: Right;    There were no vitals filed for this visit.   Subjective Assessment - 03/19/21 1117     Subjective Pt. indicated doing well with her knee, not much pain overall.    Pertinent History R TKR 02/19/21    Limitations Sitting;House hold activities;Standing;Walking    How long can you sit comfortably? 1 hour    How long can you stand comfortably? 10 min    How long can you walk comfortably? 15-20 min    Patient Stated Goals to be able to walk without a cane    Pain Onset 1 to 4 weeks ago                               [facility] Adult PT Treatment/Exercise - 03/19/21 0001       Knee/Hip Exercises: Stretches   Active Hamstring Stretch Right;3 reps;30 seconds    Active Hamstring Stretch Limitations seated    Knee: Self-Stretch Limitations tailgate stretch 10 sec X 3 min    Gastroc  Stretch Both;3 reps;30 seconds    Gastroc Stretch Limitations slantboard      Knee/Hip Exercises: Aerobic   Recumbent Bike L1 X 10 min      Knee/Hip Exercises: Machines for Strengthening   Total Gym Leg Press Rt leg 31# X 15 reps      Knee/Hip Exercises: Standing   Heel Raises 15 reps    Heel Raises Limitations heel and toe raises    Hip Abduction Both;15 reps      Knee/Hip Exercises: Seated   Long Arc Quad 3 sets;10 reps    Long Arc Quad Weight 3 lbs.    Other Seated Knee/Hip Exercises seated SLR Rt 2X10    Hamstring Curl Right;2 sets;10 reps    Hamstring Limitations green    Sit to Sand without UE support;15 reps      Manual Therapy   Manual therapy comments seated Rt knee flexion mobilization c movmeent (IR, distraction, flexion) c contralateral leg movement opposite                      PT Short Term Goals - 03/12/21 1501       PT  SHORT TERM GOAL #1   Title Pt will be indep with HEP    Status On-going      PT SHORT TERM GOAL #2   Title Pt will demo improved R knee flexion to 100 degrees to assist with greater ease with transfers    Status On-going      PT SHORT TERM GOAL #3   Title Pt will be able to perform 10 STS transfers without compensation for functional strengthening    Status On-going               PT Long Term Goals - 03/07/21 1331       PT LONG TERM GOAL #1   Title Pt will be able to perform steps in most comfortable fashion (reciprocal vs step to) with 1-2 rails with </= 3/10 R knee pain    Baseline step to only with difficulty    Time 5    Period Weeks    Status New    Target Date 04/13/21      PT LONG TERM GOAL #2   Title Pt will be able to step in/out of shower without assist of husband (if deemed safe)    Baseline needs husband assist    Time 5    Period Weeks    Status New    Target Date 04/13/21      PT LONG TERM GOAL #3   Title Pt will be able to stand x 30 min to assist with meal prep and fxnal activities    Baseline 5-10 min    Time 5    Period Weeks    Status New    Target Date 04/13/21      PT LONG TERM GOAL #4   Title Pt will be able to walk >/= 40 min without or with LRAD for community activity    Baseline uses walker and up to 20 min    Time 5    Period Weeks    Status New    Target Date 04/13/21      PT LONG TERM GOAL #5   Title Pt will be able to demo improved R knee flexion >/= 110 degrees    Baseline 90 degrees    Time 5    Period Weeks    Status New  Target Date 04/13/21      Additional Long Term Goals   Additional Long Term Goals Yes      PT LONG TERM GOAL #6   Title Pt will demo improved R LE strength 5/5 throughout    Baseline 4+/5 throughout    Time 5    Period Weeks    Status New    Target Date 04/13/21                   Plan - 03/19/21 1128     Clinical Impression Statement Continued to work to improve overall knee ROM,  strength, and gait as tolerated. Overall is progressing as expected S/P TKA.    Personal Factors and Comorbidities Comorbidity 1;Comorbidity 2    Comorbidities R TKR 02/19/21, A Fib    Examination-Activity Limitations Bathing;Squat;Stairs;Locomotion Level;Stand;Sit    PT Treatment/Interventions Cryotherapy;Moist Heat;Electrical Stimulation;DME Instruction;Gait training;Stair training;Balance training;Therapeutic exercise;Therapeutic activities;Functional mobility training;Neuromuscular re-education;Patient/family education;Manual techniques;Scar mobilization;Passive range of motion;Dry needling;Taping;Vasopneumatic Device    PT Next Visit Plan Rt knee flexion mobility gains(manual and ther ex), progress balance intervention    PT Home Exercise Plan HHPT HEP reviewed and still appropriate for another 2 weeks. (ankle pumps, quad sets, heel slides, SLR,SAQ,LAQ)    Consulted and Agree with Plan of Care Patient             Patient will benefit from skilled therapeutic intervention in order to improve the following deficits and impairments:  Abnormal gait, Decreased endurance, Decreased mobility, Difficulty walking, Increased edema, Decreased scar mobility, Decreased range of motion, Decreased activity tolerance, Decreased strength, Impaired flexibility, Pain  Visit Diagnosis: Acute pain of right knee  Muscle weakness (generalized)  Antalgic gait     Problem List Patient Active Problem List   Diagnosis Date Noted   Status post total right knee replacement 02/19/2021   Post-traumatic osteoarthritis of right knee 12/04/2020   Positive colorectal cancer screening using Cologuard test 07/02/2020   History of colectomy 07/02/2020   Colon cancer screening 07/02/2020   CHF (congestive heart failure) (HCC)    Acute respiratory failure with hypoxia ([place]) 02/12/2020   Acute bronchitis 97/98/9211   Acute systolic heart failure (HCC) 01/19/2020   Atrial fibrillation with rapid ventricular  response ([place]) 01/13/2020   CKD (chronic kidney disease) stage 3, GFR 30-59 ml/min (HCC)    Benign essential HTN    HLD (hyperlipidemia)    Hyperlipidemia 02/12/2017   AKI (acute kidney injury) ([place])    Chest pain 01/13/2017   Chest pressure    Acute kidney injury superimposed on CKD ([place])    Osteoporosis 10/18/2015   Vitamin D deficiency 07/20/2015   HTN (hypertension) 07/17/2015   GERD (gastroesophageal reflux disease) 01/12/2015   Chronic constipation 01/12/2015   History of colon surgery 01/12/2015   History of total hysterectomy with bilateral salpingo-oophorectomy (BSO) 01/12/2015   Allergy    Asthma     Silvestre Mesi 03/19/2021, 11:42 AM  [facility] Physical Therapy [address] [place], [place], [address] Phone: [phone]   Fax:  [phone]  Name: Erin Wong MRN: [id number] Date of Birth: [birth year]

## 2021-03-19 NOTE — Telephone Encounter (Signed)
Weaning to norco.  Just sent in

## 2021-03-20 ENCOUNTER — Telehealth: Payer: Self-pay | Admitting: *Deleted

## 2021-03-20 ENCOUNTER — Other Ambulatory Visit: Payer: Self-pay | Admitting: Physician Assistant

## 2021-03-20 MED ORDER — HYDROCODONE-ACETAMINOPHEN 5-325 MG PO TABS: 1.0000 | ORAL_TABLET | Freq: Two times a day (BID) | ORAL | 0 refills | Status: DC | PRN

## 2021-03-20 NOTE — Telephone Encounter (Signed)
30 day Ortho bundle call. She reports that medication refill that she requested yesterday didn't go through to her pharmacy. I don't see med list in chart either where it went through. Could she get refill sent in? Thanks.

## 2021-03-20 NOTE — Telephone Encounter (Signed)
Not sure what happened.  Weaning to norco and just resent.

## 2021-03-20 NOTE — Telephone Encounter (Signed)
Called patient no answer. LMOM. Rx sent to pharm.

## 2021-03-21 ENCOUNTER — Other Ambulatory Visit: Payer: Self-pay

## 2021-03-21 ENCOUNTER — Ambulatory Visit (INDEPENDENT_AMBULATORY_CARE_PROVIDER_SITE_OTHER): Payer: Medicare Other | Admitting: Physical Therapy

## 2021-03-21 DIAGNOSIS — M6281 Muscle weakness (generalized): Secondary | ICD-10-CM | POA: Diagnosis not present

## 2021-03-21 DIAGNOSIS — M25561 Pain in right knee: Secondary | ICD-10-CM

## 2021-03-21 DIAGNOSIS — R2689 Other abnormalities of gait and mobility: Secondary | ICD-10-CM | POA: Diagnosis not present

## 2021-03-21 NOTE — Therapy (Signed)
[facility] Physical Therapy [address] [place], [place], [address] Phone: [phone]   Fax:  [phone]  Physical Therapy Treatment  Patient Details  Name: Erin Wong MRN: [id number] Date of Birth: [birth year] Referring Provider (PT): Dr. Erlinda Hong, MD   Encounter Date: 03/21/2021   PT End of Session - 03/21/21 1334     Visit Number 6    Number of Visits 12    Date for PT Re-Evaluation 04/13/21    Authorization Type MCR    Progress Note Due on Visit 10    PT Start Time 1300    PT Stop Time 1340    PT Time Calculation (min) 40 min    Activity Tolerance Patient tolerated treatment well    Behavior During Therapy [facility] for tasks assessed/performed             Past Medical History:  Diagnosis Date   Allergy    Arthritis    Asthma    Atrial fibrillation ([place])    Benign essential HTN    CHF (congestive heart failure) (HCC)    CKD (chronic kidney disease) stage 3, GFR 30-59 ml/min (HCC)    Dysrhythmia     Afib -  had Ablation   GERD (gastroesophageal reflux disease)    HLD (hyperlipidemia)    HTN (hypertension)    Pneumonia     Past Surgical History:  Procedure Laterality Date   ABDOMINAL HYSTERECTOMY  10/04/1995   Hysterectomy and Bilateral oophorectomy   CARDIOVERSION N/A 01/17/2020   Procedure: CARDIOVERSION;  Surgeon: [name], MD;  Location: [place];  Service: Cardiovascular;  Laterality: N/A;   COLON SURGERY  09/2009   hole in colon   FRACTURE SURGERY Right 08/02/2011   Right Leg--Plates & Screws   HAND SURGERY Right 09/02/2006   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--Arthritis   HARDWARE REMOVAL Right 12/04/2020   Procedure: rigth knee hardware removal;  Surgeon: Leandrew Koyanagi, MD;  Location: [place];  Service: Orthopedics;  Laterality: Right;   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   at time of colon surgery   TEE WITHOUT CARDIOVERSION N/A 01/17/2020   Procedure: TRANSESOPHAGEAL ECHOCARDIOGRAM (TEE);  Surgeon: [name], MD;  Location: [facility]  ENDOSCOPY;  Service: Cardiovascular;  Laterality: N/A;   TOTAL KNEE ARTHROPLASTY Right 02/19/2021   Procedure: RIGHT TOTAL KNEE ARTHROPLASTY;  Surgeon: Leandrew Koyanagi, MD;  Location: [place];  Service: Orthopedics;  Laterality: Right;    There were no vitals filed for this visit.   Subjective Assessment - 03/21/21 1315     Subjective Pt. states she had her pain meds switched up and this has helped a lot, no adverse effects to this. Pain is overall 3/10 today in her knee.    Pertinent History R TKR 02/19/21    Limitations Sitting;House hold activities;Standing;Walking    How long can you sit comfortably? 1 hour    How long can you stand comfortably? 10 min    How long can you walk comfortably? 15-20 min    Patient Stated Goals to be able to walk without a cane    Pain Onset 1 to 4 weeks ago                               [facility] Adult PT Treatment/Exercise - 03/21/21 0001       Knee/Hip Exercises: Stretches   Active Hamstring Stretch Right;3 reps;30 seconds    Active Hamstring Stretch Limitations seated  Knee: Self-Stretch Limitations tailgate stretch 10 sec X 3 min    Gastroc Stretch Both;3 reps;30 seconds    Gastroc Stretch Limitations slantboard      Knee/Hip Exercises: Aerobic   Nustep L5 X10 min      Knee/Hip Exercises: Machines for Strengthening   Cybex Knee Extension 10# 3X10 DL    Cybex Knee Flexion 25# 3X10 bilat    Total Gym Leg Press Rt leg 31# 3X10 reps, then DL 56# 3X10      Knee/Hip Exercises: Standing   Heel Raises 15 reps    Heel Raises Limitations heel and toe raises    Other Standing Knee Exercises lateral walking (no UE support) and march walking (one UE support) 3 round trips at counter top      Manual Therapy   Manual therapy comments seated Rt knee flexion mobilization c movmeent (IR, distraction, flexion) c contralateral leg movement opposite                      PT Short Term Goals - 03/12/21 1501       PT SHORT TERM  GOAL #1   Title Pt will be indep with HEP    Status On-going      PT SHORT TERM GOAL #2   Title Pt will demo improved R knee flexion to 100 degrees to assist with greater ease with transfers    Status On-going      PT SHORT TERM GOAL #3   Title Pt will be able to perform 10 STS transfers without compensation for functional strengthening    Status On-going               PT Long Term Goals - 03/07/21 1331       PT LONG TERM GOAL #1   Title Pt will be able to perform steps in most comfortable fashion (reciprocal vs step to) with 1-2 rails with </= 3/10 R knee pain    Baseline step to only with difficulty    Time 5    Period Weeks    Status New    Target Date 04/13/21      PT LONG TERM GOAL #2   Title Pt will be able to step in/out of shower without assist of husband (if deemed safe)    Baseline needs husband assist    Time 5    Period Weeks    Status New    Target Date 04/13/21      PT LONG TERM GOAL #3   Title Pt will be able to stand x 30 min to assist with meal prep and fxnal activities    Baseline 5-10 min    Time 5    Period Weeks    Status New    Target Date 04/13/21      PT LONG TERM GOAL #4   Title Pt will be able to walk >/= 40 min without or with LRAD for community activity    Baseline uses walker and up to 20 min    Time 5    Period Weeks    Status New    Target Date 04/13/21      PT LONG TERM GOAL #5   Title Pt will be able to demo improved R knee flexion >/= 110 degrees    Baseline 90 degrees    Time 5    Period Weeks    Status New    Target Date 04/13/21  Additional Long Term Goals   Additional Long Term Goals Yes      PT LONG TERM GOAL #6   Title Pt will demo improved R LE strength 5/5 throughout    Baseline 4+/5 throughout    Time 5    Period Weeks    Status New    Target Date 04/13/21                   Plan - 03/21/21 1334     Clinical Impression Statement Pain is well controlled on meds upon arriving. Gradually  progressed her over leg strength program and she had good overall tolerance to this. ROM is coming along well but she will need more overall strength and gait/balance training.    Personal Factors and Comorbidities Comorbidity 1;Comorbidity 2    Comorbidities R TKR 02/19/21, A Fib    Examination-Activity Limitations Bathing;Squat;Stairs;Locomotion Level;Stand;Sit    PT Treatment/Interventions Cryotherapy;Moist Heat;Electrical Stimulation;DME Instruction;Gait training;Stair training;Balance training;Therapeutic exercise;Therapeutic activities;Functional mobility training;Neuromuscular re-education;Patient/family education;Manual techniques;Scar mobilization;Passive range of motion;Dry needling;Taping;Vasopneumatic Device    PT Next Visit Plan Rt knee flexion mobility gains(manual and ther ex), progress balance intervention    PT Home Exercise Plan HHPT HEP reviewed and still appropriate for another 2 weeks. (ankle pumps, quad sets, heel slides, SLR,SAQ,LAQ)    Consulted and Agree with Plan of Care Patient             Patient will benefit from skilled therapeutic intervention in order to improve the following deficits and impairments:  Abnormal gait, Decreased endurance, Decreased mobility, Difficulty walking, Increased edema, Decreased scar mobility, Decreased range of motion, Decreased activity tolerance, Decreased strength, Impaired flexibility, Pain  Visit Diagnosis: Acute pain of right knee  Muscle weakness (generalized)  Antalgic gait     Problem List Patient Active Problem List   Diagnosis Date Noted   Status post total right knee replacement 02/19/2021   Post-traumatic osteoarthritis of right knee 12/04/2020   Positive colorectal cancer screening using Cologuard test 07/02/2020   History of colectomy 07/02/2020   Colon cancer screening 07/02/2020   CHF (congestive heart failure) (HCC)    Acute respiratory failure with hypoxia ([place]) 02/12/2020   Acute bronchitis 50/56/9794    Acute systolic heart failure (HCC) 01/19/2020   Atrial fibrillation with rapid ventricular response ([place]) 01/13/2020   CKD (chronic kidney disease) stage 3, GFR 30-59 ml/min (HCC)    Benign essential HTN    HLD (hyperlipidemia)    Hyperlipidemia 02/12/2017   AKI (acute kidney injury) ([place])    Chest pain 01/13/2017   Chest pressure    Acute kidney injury superimposed on CKD ([place])    Osteoporosis 10/18/2015   Vitamin D deficiency 07/20/2015   HTN (hypertension) 07/17/2015   GERD (gastroesophageal reflux disease) 01/12/2015   Chronic constipation 01/12/2015   History of colon surgery 01/12/2015   History of total hysterectomy with bilateral salpingo-oophorectomy (BSO) 01/12/2015   Allergy    Asthma     Silvestre Mesi 03/21/2021, 1:41 PM  [facility] Physical Therapy [address] [place], [place], [address] Phone: [phone]   Fax:  [phone]  Name: RAISHA BRABENDER MRN: [id number] Date of Birth: [birth year]

## 2021-03-23 ENCOUNTER — Other Ambulatory Visit: Payer: Self-pay | Admitting: Family Medicine

## 2021-03-23 ENCOUNTER — Ambulatory Visit (INDEPENDENT_AMBULATORY_CARE_PROVIDER_SITE_OTHER): Payer: Medicare Other | Admitting: Physical Therapy

## 2021-03-23 ENCOUNTER — Other Ambulatory Visit: Payer: Self-pay

## 2021-03-23 DIAGNOSIS — R2689 Other abnormalities of gait and mobility: Secondary | ICD-10-CM

## 2021-03-23 DIAGNOSIS — M6281 Muscle weakness (generalized): Secondary | ICD-10-CM | POA: Diagnosis not present

## 2021-03-23 DIAGNOSIS — M25561 Pain in right knee: Secondary | ICD-10-CM | POA: Diagnosis not present

## 2021-03-23 DIAGNOSIS — L608 Other nail disorders: Secondary | ICD-10-CM

## 2021-03-23 NOTE — Therapy (Signed)
[facility] [address] [place], [place], [address] Phone: [phone]   Fax:  [phone]  Physical Therapy Treatment  Patient Details  Name: Erin Wong MRN: [id number] Date of Birth: [birth year] Referring Provider (PT): Dr. Erlinda Hong, MD   Encounter Date: 03/23/2021   PT End of Session - 03/23/21 1003     Visit Number 7    Number of Visits 12    Date for PT Re-Evaluation 04/13/21    Authorization Type MCR    Progress Note Due on Visit 10    PT Start Time 0931    PT Stop Time 1015    PT Time Calculation (min) 44 min    Activity Tolerance Patient tolerated treatment well    Behavior During Therapy [facility] for tasks assessed/performed             Past Medical History:  Diagnosis Date   Allergy    Arthritis    Asthma    Atrial fibrillation ([place])    Benign essential HTN    CHF (congestive heart failure) (HCC)    CKD (chronic kidney disease) stage 3, GFR 30-59 ml/min (HCC)    Dysrhythmia     Afib -  had Ablation   GERD (gastroesophageal reflux disease)    HLD (hyperlipidemia)    HTN (hypertension)    Pneumonia     Past Surgical History:  Procedure Laterality Date   ABDOMINAL HYSTERECTOMY  10/04/1995   Hysterectomy and Bilateral oophorectomy   CARDIOVERSION N/A 01/17/2020   Procedure: CARDIOVERSION;  Surgeon: [name], MD;  Location: [place];  Service: Cardiovascular;  Laterality: N/A;   COLON SURGERY  09/2009   hole in colon   FRACTURE SURGERY Right 08/02/2011   Right Leg--Plates & Screws   HAND SURGERY Right 09/02/2006   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--Arthritis   HARDWARE REMOVAL Right 12/04/2020   Procedure: rigth knee hardware removal;  Surgeon: Leandrew Koyanagi, MD;  Location: [place];  Service: Orthopedics;  Laterality: Right;   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   at time of colon surgery   TEE WITHOUT CARDIOVERSION N/A 01/17/2020   Procedure: TRANSESOPHAGEAL ECHOCARDIOGRAM (TEE);  Surgeon: [name], MD;  Location: [facility]  ENDOSCOPY;  Service: Cardiovascular;  Laterality: N/A;   TOTAL KNEE ARTHROPLASTY Right 02/19/2021   Procedure: RIGHT TOTAL KNEE ARTHROPLASTY;  Surgeon: Leandrew Koyanagi, MD;  Location: [place];  Service: Orthopedics;  Laterality: Right;    There were no vitals filed for this visit.   Subjective Assessment - 03/23/21 0958     Subjective Pt. states the new pain meds did not help much last night and did not get much sleep but she took 2 this morning and that has helped her knee pain    Pertinent History R TKR 02/19/21    Limitations Sitting;House hold activities;Standing;Walking    How long can you sit comfortably? 1 hour    How long can you stand comfortably? 10 min    How long can you walk comfortably? 15-20 min    Patient Stated Goals to be able to walk without a cane    Pain Onset 1 to 4 weeks ago                [facility] PT Assessment - 03/23/21 0001       Assessment   Medical Diagnosis R TKR    Referring Provider (PT) Dr. Erlinda Hong, MD    Onset Date/Surgical Date 02/19/21      AROM   Right  Knee Extension -2      PROM   Right Knee Extension 0    Right Knee Flexion 120      Strength   Overall Strength Comments Rt knee strength 4+                           [facility] Adult PT Treatment/Exercise - 03/23/21 0001       Knee/Hip Exercises: Stretches   Active Hamstring Stretch Right;3 reps;30 seconds    Active Hamstring Stretch Limitations standing    Knee: Self-Stretch Limitations supine heelslides AAROM 10 sec X 10    Gastroc Stretch Both;3 reps;30 seconds    Gastroc Stretch Limitations slantboard      Knee/Hip Exercises: Aerobic   Nustep L6 X8 min      Knee/Hip Exercises: Machines for Strengthening   Cybex Knee Extension 10# 3X10 DL    Cybex Knee Flexion 25# 3X10 bilat    Total Gym Leg Press Rt leg 37# 3X10 reps, then DL 62# 3X10      Knee/Hip Exercises: Standing   Heel Raises 20 reps    Heel Raises Limitations heel and toe raises    Lateral Step Up Right;10  reps;Hand Hold: 2;Step Height: 6"    Forward Step Up Right;10 reps;Hand Hold: 1;Step Height: 6"    Other Standing Knee Exercises lateral walking (no UE support) and march walking (one UE support) 2 round trips at counter top      Manual Therapy   Manual therapy comments supine PROM Rt knee to tolerance                      PT Short Term Goals - 03/12/21 1501       PT SHORT TERM GOAL #1   Title Pt will be indep with HEP    Status On-going      PT SHORT TERM GOAL #2   Title Pt will demo improved R knee flexion to 100 degrees to assist with greater ease with transfers    Status On-going      PT SHORT TERM GOAL #3   Title Pt will be able to perform 10 STS transfers without compensation for functional strengthening    Status On-going               PT Long Term Goals - 03/07/21 1331       PT LONG TERM GOAL #1   Title Pt will be able to perform steps in most comfortable fashion (reciprocal vs step to) with 1-2 rails with </= 3/10 R knee pain    Baseline step to only with difficulty    Time 5    Period Weeks    Status New    Target Date 04/13/21      PT LONG TERM GOAL #2   Title Pt will be able to step in/out of shower without assist of husband (if deemed safe)    Baseline needs husband assist    Time 5    Period Weeks    Status New    Target Date 04/13/21      PT LONG TERM GOAL #3   Title Pt will be able to stand x 30 min to assist with meal prep and fxnal activities    Baseline 5-10 min    Time 5    Period Weeks    Status New    Target Date 04/13/21      PT  LONG TERM GOAL #4   Title Pt will be able to walk >/= 40 min without or with LRAD for community activity    Baseline uses walker and up to 20 min    Time 5    Period Weeks    Status New    Target Date 04/13/21      PT LONG TERM GOAL #5   Title Pt will be able to demo improved R knee flexion >/= 110 degrees    Baseline 90 degrees    Time 5    Period Weeks    Status New    Target Date  04/13/21      Additional Long Term Goals   Additional Long Term Goals Yes      PT LONG TERM GOAL #6   Title Pt will demo improved R LE strength 5/5 throughout    Baseline 4+/5 throughout    Time 5    Period Weeks    Status New    Target Date 04/13/21                   Plan - 03/23/21 1006     Clinical Impression Statement She is progressing as expected. She showed improvments in updated measurments for strength and ROM for her Rt knee. Continue POC    Personal Factors and Comorbidities Comorbidity 1;Comorbidity 2    Comorbidities R TKR 02/19/21, A Fib    Examination-Activity Limitations Bathing;Squat;Stairs;Locomotion Level;Stand;Sit    PT Treatment/Interventions Cryotherapy;Moist Heat;Electrical Stimulation;DME Instruction;Gait training;Stair training;Balance training;Therapeutic exercise;Therapeutic activities;Functional mobility training;Neuromuscular re-education;Patient/family education;Manual techniques;Scar mobilization;Passive range of motion;Dry needling;Taping;Vasopneumatic Device    PT Next Visit Plan Rt knee flexion mobility gains(manual and ther ex), progress balance intervention    PT Home Exercise Plan progressed HEP to sit to stands,  lateral walking, march walking, hamstring stretch    Consulted and Agree with Plan of Care Patient             Patient will benefit from skilled therapeutic intervention in order to improve the following deficits and impairments:  Abnormal gait, Decreased endurance, Decreased mobility, Difficulty walking, Increased edema, Decreased scar mobility, Decreased range of motion, Decreased activity tolerance, Decreased strength, Impaired flexibility, Pain  Visit Diagnosis: Acute pain of right knee  Muscle weakness (generalized)  Antalgic gait     Problem List Patient Active Problem List   Diagnosis Date Noted   Status post total right knee replacement 02/19/2021   Post-traumatic osteoarthritis of right knee 12/04/2020    Positive colorectal cancer screening using Cologuard test 07/02/2020   History of colectomy 07/02/2020   Colon cancer screening 07/02/2020   CHF (congestive heart failure) (HCC)    Acute respiratory failure with hypoxia ([place]) 02/12/2020   Acute bronchitis [id number]   Acute systolic heart failure (HCC) 01/19/2020   Atrial fibrillation with rapid ventricular response ([place]) 01/13/2020   CKD (chronic kidney disease) stage 3, GFR 30-59 ml/min (HCC)    Benign essential HTN    HLD (hyperlipidemia)    Hyperlipidemia 02/12/2017   AKI (acute kidney injury) ([place])    Chest pain 01/13/2017   Chest pressure    Acute kidney injury superimposed on CKD ([place])    Osteoporosis 10/18/2015   Vitamin D deficiency 07/20/2015   HTN (hypertension) 07/17/2015   GERD (gastroesophageal reflux disease) 01/12/2015   Chronic constipation 01/12/2015   History of colon surgery 01/12/2015   History of total hysterectomy with bilateral salpingo-oophorectomy (BSO) 01/12/2015   Allergy    Asthma  Silvestre Mesi 03/23/2021, 10:17 AM  [facility] Physical Therapy [address] [place], [place], [address] Phone: [phone]   Fax:  [phone]  Name: Erin Wong MRN: [id number] Date of Birth: May 11, 1951

## 2021-03-26 ENCOUNTER — Ambulatory Visit (INDEPENDENT_AMBULATORY_CARE_PROVIDER_SITE_OTHER): Payer: Medicare Other | Admitting: Physical Therapy

## 2021-03-26 ENCOUNTER — Telehealth: Payer: Self-pay | Admitting: *Deleted

## 2021-03-26 ENCOUNTER — Telehealth: Payer: Self-pay

## 2021-03-26 ENCOUNTER — Other Ambulatory Visit: Payer: Self-pay

## 2021-03-26 DIAGNOSIS — R2689 Other abnormalities of gait and mobility: Secondary | ICD-10-CM

## 2021-03-26 DIAGNOSIS — M6281 Muscle weakness (generalized): Secondary | ICD-10-CM

## 2021-03-26 DIAGNOSIS — M25561 Pain in right knee: Secondary | ICD-10-CM | POA: Diagnosis not present

## 2021-03-26 NOTE — Telephone Encounter (Signed)
Patient called requesting refill of pain medication. She did ok with the Hydrocodone. Thanks.

## 2021-03-26 NOTE — Telephone Encounter (Signed)
Error

## 2021-03-26 NOTE — Therapy (Signed)
[facility] [address] [place], [place], [address] Phone: [phone]   Fax:  [phone]  Physical Therapy Treatment  Patient Details  Name: Erin Wong MRN: [id number] Date of Birth: 1951-05-03 Referring Provider (PT): Dr. Erlinda Hong, MD   Encounter Date: 03/26/2021   PT End of Session - 03/26/21 1425     Visit Number 8    Number of Visits 12    Date for PT Re-Evaluation 04/13/21    Authorization Type MCR    Progress Note Due on Visit 10    PT Start Time 1345    PT Stop Time 1427    PT Time Calculation (min) 42 min    Activity Tolerance Patient tolerated treatment well    Behavior During Therapy [facility] for tasks assessed/performed             Past Medical History:  Diagnosis Date   Allergy    Arthritis    Asthma    Atrial fibrillation ([place])    Benign essential HTN    CHF (congestive heart failure) (HCC)    CKD (chronic kidney disease) stage 3, GFR 30-59 ml/min (HCC)    Dysrhythmia     Afib -  had Ablation   GERD (gastroesophageal reflux disease)    HLD (hyperlipidemia)    HTN (hypertension)    Pneumonia     Past Surgical History:  Procedure Laterality Date   ABDOMINAL HYSTERECTOMY  10/04/1995   Hysterectomy and Bilateral oophorectomy   CARDIOVERSION N/A 01/17/2020   Procedure: CARDIOVERSION;  Surgeon: [name], MD;  Location: [place];  Service: Cardiovascular;  Laterality: N/A;   COLON SURGERY  09/2009   hole in colon   FRACTURE SURGERY Right 08/02/2011   Right Leg--Plates & Screws   HAND SURGERY Right 09/02/2006   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--Arthritis   HARDWARE REMOVAL Right 12/04/2020   Procedure: rigth knee hardware removal;  Surgeon: Leandrew Koyanagi, MD;  Location: [place];  Service: Orthopedics;  Laterality: Right;   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   at time of colon surgery   TEE WITHOUT CARDIOVERSION N/A 01/17/2020   Procedure: TRANSESOPHAGEAL ECHOCARDIOGRAM (TEE);  Surgeon: [name], MD;  Location: [facility]  ENDOSCOPY;  Service: Cardiovascular;  Laterality: N/A;   TOTAL KNEE ARTHROPLASTY Right 02/19/2021   Procedure: RIGHT TOTAL KNEE ARTHROPLASTY;  Surgeon: Leandrew Koyanagi, MD;  Location: [place];  Service: Orthopedics;  Laterality: Right;    There were no vitals filed for this visit.   Subjective Assessment - 03/26/21 1427     Subjective Pt. states she felt sick over the weekend and may have ate something bad. She feels better now thought, relays 4/10 overall knee pain    Pertinent History R TKR 02/19/21    Limitations Sitting;House hold activities;Standing;Walking    How long can you sit comfortably? 1 hour    How long can you stand comfortably? 10 min    How long can you walk comfortably? 15-20 min    Patient Stated Goals to be able to walk without a cane    Pain Onset 1 to 4 weeks ago                               [facility] Adult PT Treatment/Exercise - 03/26/21 0001       Knee/Hip Exercises: Stretches   Gastroc Stretch Both;3 reps;30 seconds    Gastroc Stretch Limitations slantboard      Knee/Hip  Exercises: Aerobic   Recumbent Bike L3X8 min      Knee/Hip Exercises: Machines for Strengthening   Cybex Knee Extension 10# 3X10 DL    Cybex Knee Flexion 25# 3X10 bilat    Total Gym Leg Press Rt leg 43# 3X10 reps, then DL 75# 3X10      Knee/Hip Exercises: Standing   Heel Raises 20 reps    Heel Raises Limitations heel and toe raises    Lateral Step Up Right;20 reps;Hand Hold: 1;Step Height: 6"    Forward Step Up Right;Hand Hold: 1;Step Height: 6";20 reps    Other Standing Knee Exercises lateral walking no UE support red band 4 round trips at counter top, march walking no UE support 4 round trips                      PT Short Term Goals - 03/12/21 1501       PT SHORT TERM GOAL #1   Title Pt will be indep with HEP    Status On-going      PT SHORT TERM GOAL #2   Title Pt will demo improved R knee flexion to 100 degrees to assist with greater ease with  transfers    Status On-going      PT SHORT TERM GOAL #3   Title Pt will be able to perform 10 STS transfers without compensation for functional strengthening    Status On-going               PT Long Term Goals - 03/07/21 1331       PT LONG TERM GOAL #1   Title Pt will be able to perform steps in most comfortable fashion (reciprocal vs step to) with 1-2 rails with </= 3/10 R knee pain    Baseline step to only with difficulty    Time 5    Period Weeks    Status New    Target Date 04/13/21      PT LONG TERM GOAL #2   Title Pt will be able to step in/out of shower without assist of husband (if deemed safe)    Baseline needs husband assist    Time 5    Period Weeks    Status New    Target Date 04/13/21      PT LONG TERM GOAL #3   Title Pt will be able to stand x 30 min to assist with meal prep and fxnal activities    Baseline 5-10 min    Time 5    Period Weeks    Status New    Target Date 04/13/21      PT LONG TERM GOAL #4   Title Pt will be able to walk >/= 40 min without or with LRAD for community activity    Baseline uses walker and up to 20 min    Time 5    Period Weeks    Status New    Target Date 04/13/21      PT LONG TERM GOAL #5   Title Pt will be able to demo improved R knee flexion >/= 110 degrees    Baseline 90 degrees    Time 5    Period Weeks    Status New    Target Date 04/13/21      Additional Long Term Goals   Additional Long Term Goals Yes      PT LONG TERM GOAL #6   Title Pt will demo improved R LE  strength 5/5 throughout    Baseline 4+/5 throughout    Time 5    Period Weeks    Status New    Target Date 04/13/21                   Plan - 03/26/21 1430     Clinical Impression Statement Her overall strength and balance are progressing well with PT, ROM is doing great post op TKA. Continue to work to improve funcitonal strength and balance.    Personal Factors and Comorbidities Comorbidity 1;Comorbidity 2    Comorbidities R  TKR 02/19/21, A Fib    Examination-Activity Limitations Bathing;Squat;Stairs;Locomotion Level;Stand;Sit    PT Treatment/Interventions Cryotherapy;Moist Heat;Electrical Stimulation;DME Instruction;Gait training;Stair training;Balance training;Therapeutic exercise;Therapeutic activities;Functional mobility training;Neuromuscular re-education;Patient/family education;Manual techniques;Scar mobilization;Passive range of motion;Dry needling;Taping;Vasopneumatic Device    PT Next Visit Plan Rt knee flexion mobility gains(manual and ther ex), progress balance intervention    PT Home Exercise Plan progressed HEP to sit to stands,  lateral walking, march walking, hamstring stretch    Consulted and Agree with Plan of Care Patient             Patient will benefit from skilled therapeutic intervention in order to improve the following deficits and impairments:  Abnormal gait, Decreased endurance, Decreased mobility, Difficulty walking, Increased edema, Decreased scar mobility, Decreased range of motion, Decreased activity tolerance, Decreased strength, Impaired flexibility, Pain  Visit Diagnosis: Acute pain of right knee  Muscle weakness (generalized)  Antalgic gait     Problem List Patient Active Problem List   Diagnosis Date Noted   Status post total right knee replacement 02/19/2021   Post-traumatic osteoarthritis of right knee 12/04/2020   Positive colorectal cancer screening using Cologuard test 07/02/2020   History of colectomy 07/02/2020   Colon cancer screening 07/02/2020   CHF (congestive heart failure) (HCC)    Acute respiratory failure with hypoxia ([place]) 02/12/2020   Acute bronchitis 99991111   Acute systolic heart failure ([place]) 01/19/2020   Atrial fibrillation with rapid ventricular response ([place]) 01/13/2020   CKD (chronic kidney disease) stage 3, GFR 30-59 ml/min (HCC)    Benign essential HTN    HLD (hyperlipidemia)    Hyperlipidemia 02/12/2017   AKI (acute kidney injury)  ([place])    Chest pain 01/13/2017   Chest pressure    Acute kidney injury superimposed on CKD ([place])    Osteoporosis 10/18/2015   Vitamin D deficiency 07/20/2015   HTN (hypertension) 07/17/2015   GERD (gastroesophageal reflux disease) 01/12/2015   Chronic constipation 01/12/2015   History of colon surgery 01/12/2015   History of total hysterectomy with bilateral salpingo-oophorectomy (BSO) 01/12/2015   Allergy    Asthma     Silvestre Mesi 03/26/2021, 2:31 PM  [facility] Physical Therapy [address] [place], [place], [address] Phone: [phone]   Fax:  [phone]  Name: Erin Wong MRN: [id number] Date of Birth: 05-06-1951

## 2021-03-27 ENCOUNTER — Other Ambulatory Visit: Payer: Self-pay | Admitting: Physician Assistant

## 2021-03-27 MED ORDER — HYDROCODONE-ACETAMINOPHEN 5-325 MG PO TABS: 1.0000 | ORAL_TABLET | Freq: Two times a day (BID) | ORAL | 0 refills | Status: DC | PRN

## 2021-03-27 NOTE — Telephone Encounter (Signed)
Refill sent.  Please use as sparingly as possible

## 2021-03-29 ENCOUNTER — Ambulatory Visit (INDEPENDENT_AMBULATORY_CARE_PROVIDER_SITE_OTHER): Payer: Medicare Other | Admitting: Physical Therapy

## 2021-03-29 ENCOUNTER — Other Ambulatory Visit: Payer: Self-pay

## 2021-03-29 DIAGNOSIS — M6281 Muscle weakness (generalized): Secondary | ICD-10-CM | POA: Diagnosis not present

## 2021-03-29 DIAGNOSIS — R2689 Other abnormalities of gait and mobility: Secondary | ICD-10-CM

## 2021-03-29 DIAGNOSIS — M25561 Pain in right knee: Secondary | ICD-10-CM | POA: Diagnosis not present

## 2021-03-29 NOTE — Therapy (Signed)
[facility] [address] [place], [place], [address] Phone: [phone]   Fax:  [phone]  Physical Therapy Treatment  Patient Details  Name: Erin Wong MRN: [id number] Date of Birth: 13-Oct-1950 Referring Provider (PT): Dr. Erlinda Hong, MD   Encounter Date: 03/29/2021   PT End of Session - 03/29/21 1342     Visit Number 9    Number of Visits 12    Date for PT Re-Evaluation 04/13/21    Authorization Type MCR    Progress Note Due on Visit 10    PT Start Time 1300    PT Stop Time 1350    PT Time Calculation (min) 50 min    Activity Tolerance Patient tolerated treatment well    Behavior During Therapy [facility] for tasks assessed/performed             Past Medical History:  Diagnosis Date   Allergy    Arthritis    Asthma    Atrial fibrillation ([place])    Benign essential HTN    CHF (congestive heart failure) (HCC)    CKD (chronic kidney disease) stage 3, GFR 30-59 ml/min (HCC)    Dysrhythmia     Afib -  had Ablation   GERD (gastroesophageal reflux disease)    HLD (hyperlipidemia)    HTN (hypertension)    Pneumonia     Past Surgical History:  Procedure Laterality Date   ABDOMINAL HYSTERECTOMY  10/04/1995   Hysterectomy and Bilateral oophorectomy   CARDIOVERSION N/A 01/17/2020   Procedure: CARDIOVERSION;  Surgeon: [name], MD;  Location: [place];  Service: Cardiovascular;  Laterality: N/A;   COLON SURGERY  09/2009   hole in colon   FRACTURE SURGERY Right 08/02/2011   Right Leg--Plates & Screws   HAND SURGERY Right 09/02/2006   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--Arthritis   HARDWARE REMOVAL Right 12/04/2020   Procedure: rigth knee hardware removal;  Surgeon: Leandrew Koyanagi, MD;  Location: [place];  Service: Orthopedics;  Laterality: Right;   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   at time of colon surgery   TEE WITHOUT CARDIOVERSION N/A 01/17/2020   Procedure: TRANSESOPHAGEAL ECHOCARDIOGRAM (TEE);  Surgeon: [name], MD;  Location: [facility]  ENDOSCOPY;  Service: Cardiovascular;  Laterality: N/A;   TOTAL KNEE ARTHROPLASTY Right 02/19/2021   Procedure: RIGHT TOTAL KNEE ARTHROPLASTY;  Surgeon: Leandrew Koyanagi, MD;  Location: [place];  Service: Orthopedics;  Laterality: Right;    There were no vitals filed for this visit.   Subjective Assessment - 03/29/21 1308     Subjective Pt. states she is not having much knee pain overall but is concerned by more swelling overall in her Rt leg    Pertinent History R TKR 02/19/21    Limitations Sitting;House hold activities;Standing;Walking    How long can you sit comfortably? 1 hour    How long can you stand comfortably? 10 min    How long can you walk comfortably? 15-20 min    Patient Stated Goals to be able to walk without a cane    Pain Onset 1 to 4 weeks ago                [facility] PT Assessment - 03/29/21 0001       Assessment   Medical Diagnosis R TKR    Referring Provider (PT) Dr. Erlinda Hong, MD    Onset Date/Surgical Date 02/19/21      AROM   Right Knee Extension -2      PROM  Right Knee Extension 0    Right Knee Flexion 120      Strength   Overall Strength Comments Rt knee strength 4+                           [facility] Adult PT Treatment/Exercise - 03/29/21 0001       Knee/Hip Exercises: Stretches   Active Hamstring Stretch Right;3 reps;30 seconds    Active Hamstring Stretch Limitations supine with strap    Knee: Self-Stretch Limitations supine heelslides AAROM 5 sec X 15    Gastroc Stretch Both;3 reps;30 seconds    Gastroc Stretch Limitations slantboard      Knee/Hip Exercises: Aerobic   Nustep L6 X10 min      Knee/Hip Exercises: Machines for Strengthening   Cybex Knee Extension 15# 3X10 DL    Cybex Knee Flexion 25# 3X10 bilat    Total Gym Leg Press Rt leg 50# 2X10 reps, then DL 87# 2X10      Knee/Hip Exercises: Seated   Sit to Sand without UE support;15 reps      Modalities   Modalities Vasopneumatic      Vasopneumatic   Number Minutes  Vasopneumatic  10 minutes    Vasopnuematic Location  Knee    Vasopneumatic Pressure Medium    Vasopneumatic Temperature  34      Manual Therapy   Manual therapy comments supine PROM Rt knee to tolerance, extension mobs                      PT Short Term Goals - 03/12/21 1501       PT SHORT TERM GOAL #1   Title Pt will be indep with HEP    Status On-going      PT SHORT TERM GOAL #2   Title Pt will demo improved R knee flexion to 100 degrees to assist with greater ease with transfers    Status On-going      PT SHORT TERM GOAL #3   Title Pt will be able to perform 10 STS transfers without compensation for functional strengthening    Status On-going               PT Long Term Goals - 03/07/21 1331       PT LONG TERM GOAL #1   Title Pt will be able to perform steps in most comfortable fashion (reciprocal vs step to) with 1-2 rails with </= 3/10 R knee pain    Baseline step to only with difficulty    Time 5    Period Weeks    Status New    Target Date 04/13/21      PT LONG TERM GOAL #2   Title Pt will be able to step in/out of shower without assist of husband (if deemed safe)    Baseline needs husband assist    Time 5    Period Weeks    Status New    Target Date 04/13/21      PT LONG TERM GOAL #3   Title Pt will be able to stand x 30 min to assist with meal prep and fxnal activities    Baseline 5-10 min    Time 5    Period Weeks    Status New    Target Date 04/13/21      PT LONG TERM GOAL #4   Title Pt will be able to walk >/= 40 min without or  with LRAD for community activity    Baseline uses walker and up to 20 min    Time 5    Period Weeks    Status New    Target Date 04/13/21      PT LONG TERM GOAL #5   Title Pt will be able to demo improved R knee flexion >/= 110 degrees    Baseline 90 degrees    Time 5    Period Weeks    Status New    Target Date 04/13/21      Additional Long Term Goals   Additional Long Term Goals Yes      PT  LONG TERM GOAL #6   Title Pt will demo improved R LE strength 5/5 throughout    Baseline 4+/5 throughout    Time 5    Period Weeks    Status New    Target Date 04/13/21                   Plan - 03/29/21 1343     Clinical Impression Statement She continues to do well with PT but does still lack some strenght and extension ROM, some balance, and some difficulty navigating stairs. PT will continue to work to improve these as able. Used vaso today in efforts to reduce edema noted in her Rt knee.    Personal Factors and Comorbidities Comorbidity 1;Comorbidity 2    Comorbidities R TKR 02/19/21, A Fib    Examination-Activity Limitations Bathing;Squat;Stairs;Locomotion Level;Stand;Sit    PT Treatment/Interventions Cryotherapy;Moist Heat;Electrical Stimulation;DME Instruction;Gait training;Stair training;Balance training;Therapeutic exercise;Therapeutic activities;Functional mobility training;Neuromuscular re-education;Patient/family education;Manual techniques;Scar mobilization;Passive range of motion;Dry needling;Taping;Vasopneumatic Device    PT Next Visit Plan Rt knee flexion mobility gains(manual and ther ex), progress balance intervention    PT Home Exercise Plan progressed HEP to sit to stands,  lateral walking, march walking, hamstring stretch    Consulted and Agree with Plan of Care Patient             Patient will benefit from skilled therapeutic intervention in order to improve the following deficits and impairments:  Abnormal gait, Decreased endurance, Decreased mobility, Difficulty walking, Increased edema, Decreased scar mobility, Decreased range of motion, Decreased activity tolerance, Decreased strength, Impaired flexibility, Pain  Visit Diagnosis: Acute pain of right knee  Muscle weakness (generalized)  Antalgic gait     Problem List Patient Active Problem List   Diagnosis Date Noted   Status post total right knee replacement 02/19/2021   Post-traumatic  osteoarthritis of right knee 12/04/2020   Positive colorectal cancer screening using Cologuard test 07/02/2020   History of colectomy 07/02/2020   Colon cancer screening 07/02/2020   CHF (congestive heart failure) (HCC)    Acute respiratory failure with hypoxia ([employer]) 02/12/2020   Acute bronchitis [id number]   Acute systolic heart failure (HCC) 01/19/2020   Atrial fibrillation with rapid ventricular response ([place]) 01/13/2020   CKD (chronic kidney disease) stage 3, GFR 30-59 ml/min (HCC)    Benign essential HTN    HLD (hyperlipidemia)    Hyperlipidemia 02/12/2017   AKI (acute kidney injury) ([place])    Chest pain 01/13/2017   Chest pressure    Acute kidney injury superimposed on CKD ([place])    Osteoporosis 10/18/2015   Vitamin D deficiency 07/20/2015   HTN (hypertension) 07/17/2015   GERD (gastroesophageal reflux disease) 01/12/2015   Chronic constipation 01/12/2015   History of colon surgery 01/12/2015   History of total hysterectomy with bilateral salpingo-oophorectomy (BSO) 01/12/2015   Allergy  Asthma     Silvestre Mesi 03/29/2021, 1:44 PM  [facility] [address] [place], [place], [address] Phone: [phone]   Fax:  [phone]  Name: Erin Wong MRN: [id number] Date of Birth: 02-22-1951

## 2021-03-30 ENCOUNTER — Ambulatory Visit (INDEPENDENT_AMBULATORY_CARE_PROVIDER_SITE_OTHER): Payer: Medicare Other | Admitting: Physical Therapy

## 2021-03-30 DIAGNOSIS — M6281 Muscle weakness (generalized): Secondary | ICD-10-CM | POA: Diagnosis not present

## 2021-03-30 DIAGNOSIS — R2689 Other abnormalities of gait and mobility: Secondary | ICD-10-CM

## 2021-03-30 DIAGNOSIS — M25561 Pain in right knee: Secondary | ICD-10-CM | POA: Diagnosis not present

## 2021-03-30 NOTE — Therapy (Signed)
[facility] [address] [place], [place], [address] Phone: [phone]   Fax:  [phone]  Physical Therapy Treatment/Progress note Progress Note reporting period date 03/07/21 to 03/30/21  See below for objective and subjective measurements relating to patients progress with PT.   Patient Details  Name: Erin Wong MRN: [id number] Date of Birth: [birth year] Referring Provider (PT): Dr. Erlinda Hong, MD   Encounter Date: 03/30/2021   PT End of Session - 03/30/21 1112     Visit Number 10    Number of Visits 12    Date for PT Re-Evaluation 04/13/21    Authorization Type MCR    Progress Note Due on Visit 20    PT Start Time 1055    PT Stop Time 1133    PT Time Calculation (min) 38 min    Activity Tolerance Patient tolerated treatment well    Behavior During Therapy [facility] for tasks assessed/performed             Past Medical History:  Diagnosis Date   Allergy    Arthritis    Asthma    Atrial fibrillation ([place])    Benign essential HTN    CHF (congestive heart failure) (HCC)    CKD (chronic kidney disease) stage 3, GFR 30-59 ml/min (HCC)    Dysrhythmia     Afib -  had Ablation   GERD (gastroesophageal reflux disease)    HLD (hyperlipidemia)    HTN (hypertension)    Pneumonia     Past Surgical History:  Procedure Laterality Date   ABDOMINAL HYSTERECTOMY  10/04/1995   Hysterectomy and Bilateral oophorectomy   CARDIOVERSION N/A 01/17/2020   Procedure: CARDIOVERSION;  Surgeon: [name], MD;  Location: [place];  Service: Cardiovascular;  Laterality: N/A;   COLON SURGERY  09/2009   hole in colon   FRACTURE SURGERY Right 08/02/2011   Right Leg--Plates & Screws   HAND SURGERY Right 09/02/2006   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--Arthritis   HARDWARE REMOVAL Right 12/04/2020   Procedure: rigth knee hardware removal;  Surgeon: Leandrew Koyanagi, MD;  Location: [place];  Service: Orthopedics;  Laterality: Right;   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    at time of colon surgery   TEE WITHOUT CARDIOVERSION N/A 01/17/2020   Procedure: TRANSESOPHAGEAL ECHOCARDIOGRAM (TEE);  Surgeon: [name], MD;  [facility] ENDOSCOPY;  Service: Cardiovascular;  Laterality: N/A;   TOTAL KNEE ARTHROPLASTY Right 02/19/2021   Procedure: RIGHT TOTAL KNEE ARTHROPLASTY;  Surgeon: Leandrew Koyanagi, MD;  Location: [place];  Service: Orthopedics;  Laterality: Right;    There were no vitals filed for this visit.   Subjective Assessment - 03/30/21 1105     Subjective Pt. states her knee pain is doing ok, she is having less swelling overall today. She feels her Rt leg is at least 75% back to normal now.    Pertinent History R TKR 02/19/21    Limitations Sitting;House hold activities;Standing;Walking    How long can you sit comfortably? 1 hour    How long can you stand comfortably? 10 min    How long can you walk comfortably? 15-20 min    Patient Stated Goals to be able to walk without a cane    Pain Onset 1 to 4 weeks ago                [facility] PT Assessment - 03/30/21 0001       Assessment   Medical Diagnosis R TKR    Referring  Provider (PT) Dr. Erlinda Hong, MD    Onset Date/Surgical Date 02/19/21      PROM   Right Knee Extension 0    Right Knee Flexion 120      Strength   Overall Strength Comments Rt knee strength 4+                           [facility] Adult PT Treatment/Exercise - 03/30/21 0001       Knee/Hip Exercises: Stretches   Active Hamstring Stretch Right;3 reps;30 seconds    Active Hamstring Stretch Limitations seated    Gastroc Stretch Both;3 reps;30 seconds    Gastroc Stretch Limitations slantboard      Knee/Hip Exercises: Aerobic   Nustep L6 X 5 min, L5 X 5 min      Knee/Hip Exercises: Machines for Strengthening   Cybex Knee Extension 15# 3X10 DL    Cybex Knee Flexion 25# 3X10 bilat    Total Gym Leg Press Rt leg 50# 2X10 reps, then DL 87# 2X10      Knee/Hip Exercises: Standing   Other Standing Knee Exercises up with Rt  leg and down in front with left leg 6 inch step ups X 16 total in bars with UE support. Tandem walk and retro walk in bars without UE support      Knee/Hip Exercises: Seated   Sit to Sand without UE support;15 reps                      PT Short Term Goals - 03/30/21 1129       PT SHORT TERM GOAL #1   Title Pt will be indep with HEP    Status Achieved      PT SHORT TERM GOAL #2   Title Pt will demo improved R knee flexion to 100 degrees to assist with greater ease with transfers    Status Achieved      PT SHORT TERM GOAL #3   Title Pt will be able to perform 10 STS transfers without compensation for functional strengthening    Status Achieved               PT Long Term Goals - 03/30/21 1129       PT LONG TERM GOAL #1   Title Pt will be able to perform steps in most comfortable fashion (reciprocal vs step to) with 1-2 rails with </= 3/10 R knee pain    Baseline step to only with difficulty    Time 5    Period Weeks    Status On-going      PT LONG TERM GOAL #2   Title Pt will be able to step in/out of shower without assist of husband (if deemed safe)    Baseline husband still holds her hand at times    Time 5    Period Weeks    Status On-going      PT LONG TERM GOAL #3   Title Pt will be able to stand x 30 min to assist with meal prep and fxnal activities    Baseline 20 min now    Time 5    Period Weeks    Status On-going      PT LONG TERM GOAL #4   Title Pt will be able to walk >/= 40 min without or with LRAD for community activity    Baseline up to 30 min    Time 5  Period Weeks    Status On-going      PT LONG TERM GOAL #5   Title Pt will be able to demo improved R knee flexion >/= 110 degrees    Baseline 90 degrees    Time 5    Period Weeks    Status Achieved      PT LONG TERM GOAL #6   Title Pt will demo improved R LE strength 5/5 throughout    Baseline 4+/5 throughout    Time 5    Period Weeks    Status On-going                    Plan - 03/30/21 1132     Clinical Impression Statement Progress note today reflects she has made great overall progress with her PT and is at least back to >75% of her functional baseline S/P Rt TKA. See updated measurments for strenth and ROM improvements. She will continue to benefit from a few more PT sessions to maximize her strength, balance, confidence with stairs and allow her to meet her PT goals.    Personal Factors and Comorbidities Comorbidity 1;Comorbidity 2    Comorbidities R TKR 02/19/21, A Fib    Examination-Activity Limitations Bathing;Squat;Stairs;Locomotion Level;Stand;Sit    PT Treatment/Interventions Cryotherapy;Moist Heat;Electrical Stimulation;DME Instruction;Gait training;Stair training;Balance training;Therapeutic exercise;Therapeutic activities;Functional mobility training;Neuromuscular re-education;Patient/family education;Manual techniques;Scar mobilization;Passive range of motion;Dry needling;Taping;Vasopneumatic Device    PT Next Visit Plan Rt knee flexion mobility gains(manual and ther ex), progress balance intervention    PT Home Exercise Plan progressed HEP to sit to stands,  lateral walking, march walking, hamstring stretch    Consulted and Agree with Plan of Care Patient             Patient will benefit from skilled therapeutic intervention in order to improve the following deficits and impairments:  Abnormal gait, Decreased endurance, Decreased mobility, Difficulty walking, Increased edema, Decreased scar mobility, Decreased range of motion, Decreased activity tolerance, Decreased strength, Impaired flexibility, Pain  Visit Diagnosis: Acute pain of right knee  Muscle weakness (generalized)  Antalgic gait     Problem List Patient Active Problem List   Diagnosis Date Noted   Status post total right knee replacement 02/19/2021   Post-traumatic osteoarthritis of right knee 12/04/2020   Positive colorectal cancer screening using  Cologuard test 07/02/2020   History of colectomy 07/02/2020   Colon cancer screening 07/02/2020   CHF (congestive heart failure) (HCC)    Acute respiratory failure with hypoxia ([place]) 02/12/2020   Acute bronchitis 99991111   Acute systolic heart failure ([place]) 01/19/2020   Atrial fibrillation with rapid ventricular response ([place]) 01/13/2020   CKD (chronic kidney disease) stage 3, GFR 30-59 ml/min (HCC)    Benign essential HTN    HLD (hyperlipidemia)    Hyperlipidemia 02/12/2017   AKI (acute kidney injury) ([place])    Chest pain 01/13/2017   Chest pressure    Acute kidney injury superimposed on CKD ([place])    Osteoporosis 10/18/2015   Vitamin D deficiency 07/20/2015   HTN (hypertension) 07/17/2015   GERD (gastroesophageal reflux disease) 01/12/2015   Chronic constipation 01/12/2015   History of colon surgery 01/12/2015   History of total hysterectomy with bilateral salpingo-oophorectomy (BSO) 01/12/2015   Allergy    Asthma     Silvestre Mesi 03/30/2021, 11:40 AM  [facility] Physical Therapy [address] [place], [place], [address] Phone: [phone]   Fax:  [phone]  Name: Erin Wong MRN: [id number] Date of Birth:  12/23/1950    

## 2021-04-02 ENCOUNTER — Ambulatory Visit (INDEPENDENT_AMBULATORY_CARE_PROVIDER_SITE_OTHER): Payer: Medicare Other | Admitting: Rehabilitative and Restorative Service Providers"

## 2021-04-02 ENCOUNTER — Encounter: Payer: Self-pay | Admitting: Rehabilitative and Restorative Service Providers"

## 2021-04-02 DIAGNOSIS — R2689 Other abnormalities of gait and mobility: Secondary | ICD-10-CM

## 2021-04-02 DIAGNOSIS — M6281 Muscle weakness (generalized): Secondary | ICD-10-CM | POA: Diagnosis not present

## 2021-04-02 DIAGNOSIS — M25561 Pain in right knee: Secondary | ICD-10-CM

## 2021-04-02 NOTE — Therapy (Signed)
[facility] [address] [place], [place], [address] Phone: [phone]   Fax:  [phone]  Physical Therapy Treatment  Patient Details  Name: Erin Wong MRN: [id number] Date of Birth: March 24, 1951 Referring Provider (PT): Dr. Erlinda Hong, MD   Encounter Date: 04/02/2021   PT End of Session - 04/02/21 1307     Visit Number 11    Number of Visits 12    Date for PT Re-Evaluation 04/13/21    Authorization Type MCR    Progress Note Due on Visit 20    PT Start Time 1300    PT Stop Time 1340    PT Time Calculation (min) 40 min    Activity Tolerance Patient tolerated treatment well    Behavior During Therapy [facility] for tasks assessed/performed             Past Medical History:  Diagnosis Date   Allergy    Arthritis    Asthma    Atrial fibrillation ([place])    Benign essential HTN    CHF (congestive heart failure) (HCC)    CKD (chronic kidney disease) stage 3, GFR 30-59 ml/min (HCC)    Dysrhythmia     Afib -  had Ablation   GERD (gastroesophageal reflux disease)    HLD (hyperlipidemia)    HTN (hypertension)    Pneumonia     Past Surgical History:  Procedure Laterality Date   ABDOMINAL HYSTERECTOMY  10/04/1995   Hysterectomy and Bilateral oophorectomy   CARDIOVERSION N/A 01/17/2020   Procedure: CARDIOVERSION;  Surgeon: [name], MD;  Location: [place];  Service: Cardiovascular;  Laterality: N/A;   COLON SURGERY  09/2009   hole in colon   FRACTURE SURGERY Right 08/02/2011   Right Leg--Plates & Screws   HAND SURGERY Right 09/02/2006   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--Arthritis   HARDWARE REMOVAL Right 12/04/2020   Procedure: rigth knee hardware removal;  Surgeon: Leandrew Koyanagi, MD;  Location: [place];  Service: Orthopedics;  Laterality: Right;   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   at time of colon surgery   TEE WITHOUT CARDIOVERSION N/A 01/17/2020   Procedure: TRANSESOPHAGEAL ECHOCARDIOGRAM (TEE);  Surgeon: [name], MD;  Location: [facility]  ENDOSCOPY;  Service: Cardiovascular;  Laterality: N/A;   TOTAL KNEE ARTHROPLASTY Right 02/19/2021   Procedure: RIGHT TOTAL KNEE ARTHROPLASTY;  Surgeon: Leandrew Koyanagi, MD;  Location: [place];  Service: Orthopedics;  Laterality: Right;    There were no vitals filed for this visit.   Subjective Assessment - 04/02/21 1306     Subjective Pt. indicated feeling a little pain today, 3/10 at worst.  Pt. indicated she was doing better and sleeping better.    Pertinent History R TKR 02/19/21    Limitations Sitting;House hold activities;Standing;Walking    How long can you sit comfortably? 1 hour    How long can you stand comfortably? 10 min    How long can you walk comfortably? 15-20 min    Patient Stated Goals to be able to walk without a cane    Currently in Pain? Yes    Pain Score 3     Pain Location Knee    Pain Orientation Right    Pain Descriptors / Indicators Aching    Pain Type Surgical pain    Pain Onset 1 to 4 weeks ago    Pain Frequency Intermittent    Aggravating Factors  static activity    Pain Relieving Factors stretching helped tightness  [place] Adult PT Treatment/Exercise - 04/02/21 0001       Neuro Re-ed    Neuro Re-ed Details  tandem ambulation on foam in bars mild assist by hands 8 ft x 5 fwd/back, tandem stance on foam 1 min x 1 bilateral      Knee/Hip Exercises: Stretches   Press photographer Both;30 seconds;3 reps   incline board     Knee/Hip Exercises: Aerobic   Nustep Lvl 6 10 mins      Knee/Hip Exercises: Machines for Strengthening   Cybex Knee Extension 15# 3X10 DL    Cybex Knee Flexion 25# 3X10 bilat    Total Gym Leg Press Rt leg 50# 2X10 reps, then DL 87# 2X10                      PT Short Term Goals - 03/30/21 1129       PT SHORT TERM GOAL #1   Title Pt will be indep with HEP    Status Achieved      PT SHORT TERM GOAL #2   Title Pt will demo improved R knee flexion to 100 degrees to assist  with greater ease with transfers    Status Achieved      PT SHORT TERM GOAL #3   Title Pt will be able to perform 10 STS transfers without compensation for functional strengthening    Status Achieved               PT Long Term Goals - 03/30/21 1129       PT LONG TERM GOAL #1   Title Pt will be able to perform steps in most comfortable fashion (reciprocal vs step to) with 1-2 rails with </= 3/10 R knee pain    Baseline step to only with difficulty    Time 5    Period Weeks    Status On-going      PT LONG TERM GOAL #2   Title Pt will be able to step in/out of shower without assist of husband (if deemed safe)    Baseline husband still holds her hand at times    Time 5    Period Weeks    Status On-going      PT LONG TERM GOAL #3   Title Pt will be able to stand x 30 min to assist with meal prep and fxnal activities    Baseline 20 min now    Time 5    Period Weeks    Status On-going      PT LONG TERM GOAL #4   Title Pt will be able to walk >/= 40 min without or with LRAD for community activity    Baseline up to 30 min    Time 5    Period Weeks    Status On-going      PT LONG TERM GOAL #5   Title Pt will be able to demo improved R knee flexion >/= 110 degrees    Baseline 90 degrees    Time 5    Period Weeks    Status Achieved      PT LONG TERM GOAL #6   Title Pt will demo improved R LE strength 5/5 throughout    Baseline 4+/5 throughout    Time 5    Period Weeks    Status On-going                   Plan - 04/02/21 1328  Clinical Impression Statement Static and dynamic balance could continue to improve and would improve Pt. stability in ambulation on uneven surfaces.  Pt. overall continued to making gains towards established goals.    Personal Factors and Comorbidities Comorbidity 1;Comorbidity 2    Comorbidities R TKR 02/19/21, A Fib    Examination-Activity Limitations Bathing;Squat;Stairs;Locomotion Level;Stand;Sit    PT Treatment/Interventions  Cryotherapy;Moist Heat;Electrical Stimulation;DME Instruction;Gait training;Stair training;Balance training;Therapeutic exercise;Therapeutic activities;Functional mobility training;Neuromuscular re-education;Patient/family education;Manual techniques;Scar mobilization;Passive range of motion;Dry needling;Taping;Vasopneumatic Device    PT Next Visit Plan Return to MD after next visit, dynamic and static balance.    PT Home Exercise Plan progressed HEP to sit to stands,  lateral walking, march walking, hamstring stretch    Consulted and Agree with Plan of Care Patient             Patient will benefit from skilled therapeutic intervention in order to improve the following deficits and impairments:  Abnormal gait, Decreased endurance, Decreased mobility, Difficulty walking, Increased edema, Decreased scar mobility, Decreased range of motion, Decreased activity tolerance, Decreased strength, Impaired flexibility, Pain  Visit Diagnosis: Acute pain of right knee  Muscle weakness (generalized)  Antalgic gait     Problem List Patient Active Problem List   Diagnosis Date Noted   Status post total right knee replacement 02/19/2021   Post-traumatic osteoarthritis of right knee 12/04/2020   Positive colorectal cancer screening using Cologuard test 07/02/2020   History of colectomy 07/02/2020   Colon cancer screening 07/02/2020   CHF (congestive heart failure) (HCC)    Acute respiratory failure with hypoxia ([place]) 02/12/2020   Acute bronchitis 99991111   Acute systolic heart failure ([place]) 01/19/2020   Atrial fibrillation with rapid ventricular response ([place]) 01/13/2020   CKD (chronic kidney disease) stage 3, GFR 30-59 ml/min (HCC)    Benign essential HTN    HLD (hyperlipidemia)    Hyperlipidemia 02/12/2017   AKI (acute kidney injury) ([place])    Chest pain 01/13/2017   Chest pressure    Acute kidney injury superimposed on CKD ([place])    Osteoporosis 10/18/2015   Vitamin D deficiency  07/20/2015   HTN (hypertension) 07/17/2015   GERD (gastroesophageal reflux disease) 01/12/2015   Chronic constipation 01/12/2015   History of colon surgery 01/12/2015   History of total hysterectomy with bilateral salpingo-oophorectomy (BSO) 01/12/2015   Allergy    Asthma     Scot Jun, PT, DPT, OCS, ATC 04/02/21  1:31 PM    [facility] [address] [place], [place], [address] Phone: [phone]   Fax:  [phone]  Name: CHALLIS WIDEMAN MRN: [id number] Date of Birth: 1951/01/06

## 2021-04-04 ENCOUNTER — Ambulatory Visit (INDEPENDENT_AMBULATORY_CARE_PROVIDER_SITE_OTHER): Payer: Medicare Other | Admitting: Physical Therapy

## 2021-04-04 ENCOUNTER — Ambulatory Visit (INDEPENDENT_AMBULATORY_CARE_PROVIDER_SITE_OTHER): Payer: Medicare Other

## 2021-04-04 ENCOUNTER — Encounter: Payer: Self-pay | Admitting: Orthopaedic Surgery

## 2021-04-04 ENCOUNTER — Other Ambulatory Visit: Payer: Self-pay

## 2021-04-04 ENCOUNTER — Ambulatory Visit (INDEPENDENT_AMBULATORY_CARE_PROVIDER_SITE_OTHER): Payer: Medicare Other | Admitting: Orthopaedic Surgery

## 2021-04-04 DIAGNOSIS — G8929 Other chronic pain: Secondary | ICD-10-CM

## 2021-04-04 DIAGNOSIS — M25561 Pain in right knee: Secondary | ICD-10-CM | POA: Diagnosis not present

## 2021-04-04 DIAGNOSIS — Z96651 Presence of right artificial knee joint: Secondary | ICD-10-CM

## 2021-04-04 DIAGNOSIS — M1731 Unilateral post-traumatic osteoarthritis, right knee: Secondary | ICD-10-CM

## 2021-04-04 DIAGNOSIS — M6281 Muscle weakness (generalized): Secondary | ICD-10-CM

## 2021-04-04 MED ORDER — TRAMADOL HCL 50 MG PO TABS: 50.0000 mg | ORAL_TABLET | Freq: Every day | ORAL | 0 refills | Status: DC | PRN

## 2021-04-04 NOTE — Progress Notes (Signed)
Post-Op Visit Note   Patient: Erin Wong           Date of Birth: Jan 24, 1951           MRN: [id number] Visit Date: 04/04/2021 PCP: [name], MD   Assessment & Plan:  Chief Complaint:  Chief Complaint  Patient presents with   Right Knee - Pain   Visit Diagnoses:  1. Chronic pain of right knee   2. Post-traumatic osteoarthritis of right knee   3. Status post total right knee replacement     Plan: Charmell is 6 weeks status post right total knee replacement for severe valgus posttraumatic arthritis from previous tibial plateau fracture.  Overall she is getting along reasonably well.  She is progressing well with physical therapy.  Just finished her session at our office this morning.  Right knee shows a fully healed surgical incision.  No signs of dehiscence or compromise.  No signs of infection.  She does have significant swelling to the lower extremity with pitting edema.  No neurovascular compromise.  Range of motion 0 to 120 degrees.  Stable to varus valgus.  X-rays unremarkable.  Iretta is doing well and recovering from her knee replacement.  I think it is inappropriate to refill hydrocodone at this time as she is not reporting any significant pain.  I did refill tramadol.  She will continue with physical therapy 3 times a week as she is having trouble with ambulation and balance therefore she will need to continue to work on these things to prevent falls.  She takes Eliquis at baseline.  Recheck in 6weeks.  Follow-Up Instructions: Return in about 6 weeks (around 05/16/2021).   Orders:  Orders Placed This Encounter  Procedures   XR KNEE 3 VIEW RIGHT   Meds ordered this encounter  Medications   traMADol (ULTRAM) 50 MG tablet    Sig: Take 1-2 tablets (50-100 mg total) by mouth daily as needed.    Dispense:  60 tablet    Refill:  0    Imaging: XR KNEE 3 VIEW RIGHT  Result Date: 04/04/2021 Stable right total knee replacement without any complications.   PMFS  History: Patient Active Problem List   Diagnosis Date Noted   Status post total right knee replacement 02/19/2021   Post-traumatic osteoarthritis of right knee 12/04/2020   Positive colorectal cancer screening using Cologuard test 07/02/2020   History of colectomy 07/02/2020   Colon cancer screening 07/02/2020   CHF (congestive heart failure) (HCC)    Acute respiratory failure with hypoxia ([place]) 02/12/2020   Acute bronchitis 99991111   Acute systolic heart failure ([place]) 01/19/2020   Atrial fibrillation with rapid ventricular response ([place]) 01/13/2020   CKD (chronic kidney disease) stage 3, GFR 30-59 ml/min (HCC)    Benign essential HTN    HLD (hyperlipidemia)    Hyperlipidemia 02/12/2017   AKI (acute kidney injury) ([place])    Chest pain 01/13/2017   Chest pressure    Acute kidney injury superimposed on CKD ([place])    Osteoporosis 10/18/2015   Vitamin D deficiency 07/20/2015   HTN (hypertension) 07/17/2015   GERD (gastroesophageal reflux disease) 01/12/2015   Chronic constipation 01/12/2015   History of colon surgery 01/12/2015   History of total hysterectomy with bilateral salpingo-oophorectomy (BSO) 01/12/2015   Allergy    Asthma    Past Medical History:  Diagnosis Date   Allergy    Arthritis    Asthma    Atrial fibrillation ([place])    Benign  essential HTN    CHF (congestive heart failure) (HCC)    CKD (chronic kidney disease) stage 3, GFR 30-59 ml/min (HCC)    Dysrhythmia     Afib -  had Ablation   GERD (gastroesophageal reflux disease)    HLD (hyperlipidemia)    HTN (hypertension)    Pneumonia     Family History  Problem Relation Age of Onset   Arthritis Mother    Miscarriages / [place] Mother    Lung cancer Mother        Had quit smoking for 25 years   Esophageal cancer Mother    Alcohol abuse Father    Alcohol abuse Brother    Kidney cancer Brother    Breast cancer Sister    COPD Brother    Asthma Maternal Aunt    Diabetes Maternal Aunt    Colon  cancer Neg Hx    Inflammatory bowel disease Neg Hx    Liver disease Neg Hx    Pancreatic cancer Neg Hx    Rectal cancer Neg Hx    Stomach cancer Neg Hx     Past Surgical History:  Procedure Laterality Date   ABDOMINAL HYSTERECTOMY  10/04/1995   Hysterectomy and Bilateral oophorectomy   CARDIOVERSION N/A 01/17/2020   Procedure: CARDIOVERSION;  Surgeon: [name], MD;  Location: [facility] ENDOSCOPY;  Service: Cardiovascular;  Laterality: N/A;   COLON SURGERY  09/2009   hole in colon   FRACTURE SURGERY Right 08/02/2011   Right Leg--Plates & Screws   HAND SURGERY Right 09/02/2006   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--Arthritis   HARDWARE REMOVAL Right 12/04/2020   Procedure: rigth knee hardware removal;  Surgeon: Leandrew Koyanagi, MD;  Location: [place];  Service: Orthopedics;  Laterality: Right;   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   at time of colon surgery   TEE WITHOUT CARDIOVERSION N/A 01/17/2020   Procedure: TRANSESOPHAGEAL ECHOCARDIOGRAM (TEE);  Surgeon: [name], MD;  Location: [facility] ENDOSCOPY;  Service: Cardiovascular;  Laterality: N/A;   TOTAL KNEE ARTHROPLASTY Right 02/19/2021   Procedure: RIGHT TOTAL KNEE ARTHROPLASTY;  Surgeon: Leandrew Koyanagi, MD;  Location: [place];  Service: Orthopedics;  Laterality: Right;   Social History   Occupational History   Occupation: retired  Tobacco Use   Smoking status: Former    Types: Cigarettes    Quit date: 01/10/1991    Years since quitting: 30.2   Smokeless tobacco: Never  Vaping Use   Vaping Use: Never used  Substance and Sexual Activity   Alcohol use: Not Currently    Comment: quit in May/2021   Drug use: No   Sexual activity: Never

## 2021-04-04 NOTE — Therapy (Addendum)
[facility] [address] [place], [place], [address] Phone: [phone]   Fax:  [phone]  Physical Therapy Treatment/Recert / Discharge  Patient Details  Name: Erin Wong MRN: [id number] Date of Birth: [birth year] Referring Provider (PT): Dr. Erlinda Hong, MD   Encounter Date: 04/04/2021   PT End of Session - 04/04/21 0958     Visit Number 12    Number of Visits 20    Date for PT Re-Evaluation 05/02/21    Authorization Type MCR    Progress Note Due on Visit 20    PT Start Time 0930    PT Stop Time 1010    PT Time Calculation (min) 40 min    Activity Tolerance Patient tolerated treatment well    Behavior During Therapy [facility] for tasks assessed/performed             Past Medical History:  Diagnosis Date   Allergy    Arthritis    Asthma    Atrial fibrillation ([place])    Benign essential HTN    CHF (congestive heart failure) (HCC)    CKD (chronic kidney disease) stage 3, GFR 30-59 ml/min (HCC)    Dysrhythmia     Afib -  had Ablation   GERD (gastroesophageal reflux disease)    HLD (hyperlipidemia)    HTN (hypertension)    Pneumonia     Past Surgical History:  Procedure Laterality Date   ABDOMINAL HYSTERECTOMY  10/04/1995   Hysterectomy and Bilateral oophorectomy   CARDIOVERSION N/A 01/17/2020   Procedure: CARDIOVERSION;  Surgeon: [name], MD;  Location: [place];  Service: Cardiovascular;  Laterality: N/A;   COLON SURGERY  09/2009   hole in colon   FRACTURE SURGERY Right 08/02/2011   Right Leg--Plates & Screws   HAND SURGERY Right 09/02/2006   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--Arthritis   HARDWARE REMOVAL Right 12/04/2020   Procedure: rigth knee hardware removal;  Surgeon: Leandrew Koyanagi, MD;  Location: [place];  Service: Orthopedics;  Laterality: Right;   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   at time of colon surgery   TEE WITHOUT CARDIOVERSION N/A 01/17/2020   Procedure: TRANSESOPHAGEAL ECHOCARDIOGRAM (TEE);  Surgeon: [name],  MD;  Location: [facility] ENDOSCOPY;  Service: Cardiovascular;  Laterality: N/A;   TOTAL KNEE ARTHROPLASTY Right 02/19/2021   Procedure: RIGHT TOTAL KNEE ARTHROPLASTY;  Surgeon: Leandrew Koyanagi, MD;  Location: [place];  Service: Orthopedics;  Laterality: Right;    There were no vitals filed for this visit.   Subjective Assessment - 04/04/21 0952     Subjective Pt. indicated pain is doing well and no longer taking pain meds. She feels she needs a little more PT to focus on improving her balance.    Pertinent History R TKR 02/19/21    Limitations Sitting;House hold activities;Standing;Walking    How long can you sit comfortably? 1 hour    How long can you stand comfortably? 10 min    How long can you walk comfortably? 15-20 min    Patient Stated Goals to be able to walk without a cane    Pain Onset 1 to 4 weeks ago                [facility] PT Assessment - 04/04/21 0001       Assessment   Medical Diagnosis R TKR    Referring Provider (PT) Dr. Erlinda Hong, MD    Onset Date/Surgical Date 02/19/21      PROM   Right Knee Extension 0  Right Knee Flexion 120      Strength   Overall Strength Comments Rt hip/knee strength 5-                           [facility] Adult PT Treatment/Exercise - 04/04/21 0001       Neuro Re-ed    Neuro Re-ed Details  tandem ambulation, march walking, walking with head turns and head nods at counter top 3 round trips. Balance on foam pad feet apart eyes closed 30 sec X 3, then feet together eyes open 1 min, progressed to turnk rotations X 10 bilat      Knee/Hip Exercises: Aerobic   Nustep Lvl 6 10 mins      Knee/Hip Exercises: Machines for Strengthening   Cybex Knee Extension 15# 3X10 DL, Rt leg only 5# 3X10    Cybex Knee Flexion 35# 3X10 bilat    Total Gym Leg Press Rt leg 56# 2X10 reps, then DL 93# 3X10      Manual Therapy   Manual therapy comments supine PROM Rt knee to tolerance, extension mobs                      PT Short Term Goals -  03/30/21 1129       PT SHORT TERM GOAL #1   Title Pt will be indep with HEP    Status Achieved      PT SHORT TERM GOAL #2   Title Pt will demo improved R knee flexion to 100 degrees to assist with greater ease with transfers    Status Achieved      PT SHORT TERM GOAL #3   Title Pt will be able to perform 10 STS transfers without compensation for functional strengthening    Status Achieved               PT Long Term Goals - 04/04/21 1001       PT LONG TERM GOAL #1   Title Pt will be able to perform steps in most comfortable fashion (reciprocal vs step to) with 1-2 rails with </= 3/10 R knee pain    Baseline step to only with difficulty    Time 5    Period Weeks    Status On-going      PT LONG TERM GOAL #2   Title Pt will be able to step in/out of shower without assist of husband (if deemed safe)    Baseline husband still holds her hand at times    Time 5    Period Weeks    Status On-going      PT LONG TERM GOAL #3   Title Pt will be able to stand x 30 min to assist with meal prep and fxnal activities    Baseline 20 min now    Time 5    Period Weeks    Status On-going      PT LONG TERM GOAL #4   Title Pt will be able to walk >/= 40 min without or with LRAD for community activity    Baseline up to 30 min    Time 5    Period Weeks    Status On-going      PT LONG TERM GOAL #5   Title Pt will be able to demo improved R knee flexion >/= 110 degrees    Baseline 90 degrees    Time 5    Period Weeks  Status Achieved      PT LONG TERM GOAL #6   Title Pt will demo improved R LE strength 5/5 throughout    Baseline 5-/5 throughout    Time 5    Period Weeks    Status On-going                   Plan - 04/04/21 1002     Clinical Impression Statement Recert today as she is at the end of initial POC. She has made good overall progress with strength and ROM S/P TKA but does still lack some funcitonal strength and balance and has not yet met her PT goals.  PT recommending extending up to 4 more weeks to address these now mild impairments and maximize her function. She will follow up with MD today.    Personal Factors and Comorbidities Comorbidity 1;Comorbidity 2    Comorbidities R TKR 02/19/21, A Fib    Examination-Activity Limitations Bathing;Squat;Stairs;Locomotion Level;Stand;Sit    PT Treatment/Interventions Cryotherapy;Moist Heat;Electrical Stimulation;DME Instruction;Gait training;Stair training;Balance training;Therapeutic exercise;Therapeutic activities;Functional mobility training;Neuromuscular re-education;Patient/family education;Manual techniques;Scar mobilization;Passive range of motion;Dry needling;Taping;Vasopneumatic Device    PT Next Visit Plan what did MD say?  dynamic and static balance.    PT Home Exercise Plan progressed HEP to sit to stands,  lateral walking, march walking, hamstring stretch    Consulted and Agree with Plan of Care Patient             Patient will benefit from skilled therapeutic intervention in order to improve the following deficits and impairments:  Abnormal gait, Decreased endurance, Decreased mobility, Difficulty walking, Increased edema, Decreased scar mobility, Decreased range of motion, Decreased activity tolerance, Decreased strength, Impaired flexibility, Pain  Visit Diagnosis: Acute pain of right knee  Muscle weakness (generalized)     Problem List Patient Active Problem List   Diagnosis Date Noted   Status post total right knee replacement 02/19/2021   Post-traumatic osteoarthritis of right knee 12/04/2020   Positive colorectal cancer screening using Cologuard test 07/02/2020   History of colectomy 07/02/2020   Colon cancer screening 07/02/2020   CHF (congestive heart failure) (HCC)    Acute respiratory failure with hypoxia ([place]) 02/12/2020   Acute bronchitis 53/29/9242   Acute systolic heart failure ([place]) 01/19/2020   Atrial fibrillation with rapid ventricular response ([place])  01/13/2020   CKD (chronic kidney disease) stage 3, GFR 30-59 ml/min (HCC)    Benign essential HTN    HLD (hyperlipidemia)    Hyperlipidemia 02/12/2017   AKI (acute kidney injury) ([place])    Chest pain 01/13/2017   Chest pressure    Acute kidney injury superimposed on CKD ([place])    Osteoporosis 10/18/2015   Vitamin D deficiency 07/20/2015   HTN (hypertension) 07/17/2015   GERD (gastroesophageal reflux disease) 01/12/2015   Chronic constipation 01/12/2015   History of colon surgery 01/12/2015   History of total hysterectomy with bilateral salpingo-oophorectomy (BSO) 01/12/2015   Allergy    Asthma     Erin Wong,PT,DPT 04/04/2021, 10:05 AM  PHYSICAL THERAPY DISCHARGE SUMMARY  Visits from Start of Care: 12  Current functional level related to goals / functional outcomes: See note   Remaining deficits: See note   Education / Equipment: HEP   Patient agrees to discharge. Patient goals were partially met. Patient is being discharged due to not returning since the last visit.  Erin Wong, PT, DPT, OCS, ATC 05/28/21  3:25 PM     [facility] [place],  [place], [address] Phone: [phone]   Fax:  [phone]  Name: Erin Wong MRN: [id number] Date of Birth: 1951-06-30

## 2021-04-05 ENCOUNTER — Telehealth: Payer: Medicare Other | Admitting: Cardiovascular Disease

## 2021-04-05 ENCOUNTER — Ambulatory Visit: Payer: Medicare Other | Admitting: Podiatry

## 2021-04-06 ENCOUNTER — Other Ambulatory Visit: Payer: Self-pay

## 2021-04-06 ENCOUNTER — Encounter: Payer: Self-pay | Admitting: Podiatry

## 2021-04-06 ENCOUNTER — Ambulatory Visit (INDEPENDENT_AMBULATORY_CARE_PROVIDER_SITE_OTHER): Payer: Medicare Other | Admitting: Podiatry

## 2021-04-06 DIAGNOSIS — L600 Ingrowing nail: Secondary | ICD-10-CM

## 2021-04-06 NOTE — Progress Notes (Signed)
Subjective:  Patient ID: Erin Wong, female    DOB: 01/01/1951,  MRN: [id number]  Chief Complaint  Patient presents with   Nail Problem    Right hallux nail Pt thinks it may be infected     70 y.o. female presents with the above complaint.  Patient presents with thickened elongated dystrophic toenails to bilateral hallux.  Patient states that is painful to touch.  The right side looks worse than left side.  She would like to have it removed and made permanent.  She has been doing this with the nails for many decades.  She states that it started off with the heart dropping something on her foot and has progressively gotten worse.  She denies any other acute complaints she has not seen and was prior to seeing me.   Review of Systems: Negative except as noted in the HPI. Denies N/V/F/Ch.  Past Medical History:  Diagnosis Date   Allergy    Arthritis    Asthma    Atrial fibrillation (HCC)    Benign essential HTN    CHF (congestive heart failure) (HCC)    CKD (chronic kidney disease) stage 3, GFR 30-59 ml/min (HCC)    Dysrhythmia     Afib -  had Ablation   GERD (gastroesophageal reflux disease)    HLD (hyperlipidemia)    HTN (hypertension)    Pneumonia     Current Outpatient Medications:    albuterol (VENTOLIN HFA) 108 (90 Base) MCG/ACT inhaler, Inhale 1 puff into the lungs every 6 (six) hours as needed for wheezing or shortness of breath., Disp: 18 g, Rfl: 3   amiodarone (PACERONE) 200 MG tablet, Take 1/2 tablet daily (Patient taking differently: Take 100 mg by mouth daily. Take 1/2 tablet daily), Disp: 90 tablet, Rfl: 3   apixaban (ELIQUIS) 5 MG TABS tablet, Take 1 tablet (5 mg total) by mouth 2 (two) times daily., Disp: 60 tablet, Rfl: 11   atorvastatin (LIPITOR) 40 MG tablet, Take 1 tablet (40 mg total) by mouth daily., Disp: 90 tablet, Rfl: 1   benazepril (LOTENSIN) 20 MG tablet, Take 1 tablet (20 mg total) by mouth daily., Disp: 90 tablet, Rfl: 1   Biotin 5000 MCG TABS, Take  5,000 mcg by mouth daily., Disp: , Rfl:    CALCIUM GLUCONATE PO, Take 600 mg by mouth daily., Disp: , Rfl:    carvedilol (COREG) 3.125 MG tablet, Take 1 tablet (3.125 mg total) by mouth 2 (two) times daily., Disp: 180 tablet, Rfl: 3   cephALEXin (KEFLEX) 500 MG capsule, Take 1 capsule (500 mg total) by mouth 4 (four) times daily., Disp: 40 capsule, Rfl: 0   Cholecalciferol 25 MCG (1000 UT) tablet, Take 1 tablet (1,000 Units total) by mouth daily., Disp: 90 tablet, Rfl: 1   docusate sodium (COLACE) 100 MG capsule, Take 1 capsule (100 mg total) by mouth daily as needed., Disp: 30 capsule, Rfl: 2   fluticasone (FLONASE) 50 MCG/ACT nasal spray, Place 2 sprays into both nostrils daily as needed for allergies., Disp: 16 g, Rfl: 3   fluticasone-salmeterol (ADVAIR) 100-50 MCG/ACT AEPB, Inhale 1 puff into the lungs 2 (two) times daily., Disp: 60 each, Rfl: 3   furosemide (LASIX) 40 MG tablet, TAKE 1 TABLET BY MOUTH EVERY DAY (Patient taking differently: Take 40 mg by mouth daily.), Disp: 90 tablet, Rfl: 3   HYDROcodone-acetaminophen (NORCO) 5-325 MG tablet, Take 1-2 tablets by mouth 2 (two) times daily as needed., Disp: 28 tablet, Rfl: 0   methocarbamol (  ROBAXIN) 500 MG tablet, Take 1 tablet (500 mg total) by mouth 2 (two) times daily as needed. To be taken after surgery (Patient not taking: Reported on 03/09/2021), Disp: 30 tablet, Rfl: 6   omeprazole (PRILOSEC) 20 MG capsule, TAKE 1 CAPSULE BY MOUTH EVERY DAY (Patient taking differently: Take 20 mg by mouth daily.), Disp: 90 capsule, Rfl: 3   ondansetron (ZOFRAN) 4 MG tablet, Take 1 tablet (4 mg total) by mouth every 8 (eight) hours as needed for nausea or vomiting. (Patient not taking: Reported on 03/09/2021), Disp: 40 tablet, Rfl: 0   oxyCODONE-acetaminophen (PERCOCET) 5-325 MG tablet, Take 1-2 tablets by mouth every 8 (eight) hours as needed. To be taken after surgery, Disp: 28 tablet, Rfl: 0   polyethylene glycol (MIRALAX / GLYCOLAX) 17 g packet, Take 17 g by  mouth daily., Disp: , Rfl:    traMADol (ULTRAM) 50 MG tablet, Take 1-2 tablets (50-100 mg total) by mouth daily as needed., Disp: 60 tablet, Rfl: 0  Social History   Tobacco Use  Smoking Status Former   Types: Cigarettes   Quit date: 01/10/1991   Years since quitting: 30.2  Smokeless Tobacco Never    No Known Allergies Objective:  There were no vitals filed for this visit. There is no height or weight on file to calculate BMI. Constitutional Well developed. Well nourished.  Vascular Dorsalis pedis pulses palpable bilaterally. Posterior tibial pulses palpable bilaterally. Capillary refill normal to all digits.  No cyanosis or clubbing noted. Pedal hair growth normal.  Neurologic Normal speech. Oriented to person, place, and time. Epicritic sensation to light touch grossly present bilaterally.  Dermatologic Pain on palpation of the entire/total nail on 1st digit of the bilaterally No other open wounds. No skin lesions.  Orthopedic: Normal joint ROM without pain or crepitus bilaterally. No visible deformities. No bony tenderness.   Radiographs: None Assessment:   1. Ingrown toenail of right foot   2. Ingrown left big toenail    Plan:  Patient was evaluated and treated and all questions answered.  Nail contusion/dystrophy hallux, bilaterally -Patient elects to proceed with minor surgery to remove entire toenail today. Consent reviewed and signed by patient. -Entire/total nail excised. See procedure note. -Educated on post-procedure care including soaking. Written instructions provided and reviewed. -Patient to follow up in 2 weeks for nail check.  Procedure: Excision of entire/total nail with phenol matricectomy Location: Bilateral 1st toe digit Anesthesia: Lidocaine 1% plain; 1.5 mL and Marcaine 0.5% plain; 1.5 mL, digital block. Skin Prep: Betadine. Dressing: Silvadene; telfa; dry, sterile, compression dressing. Technique: Following skin prep, the toe was  exsanguinated and a tourniquet was secured at the base of the toe. The affected nail border was freed and excised.  At this time phenol sticks was used to performed matricectomy in standard technique.  The tourniquet was then removed and sterile dressing applied. Disposition: Patient tolerated procedure well. Patient to return in 2 weeks for follow-up.   No follow-ups on file.

## 2021-04-09 ENCOUNTER — Telehealth: Payer: Self-pay | Admitting: *Deleted

## 2021-04-09 NOTE — Telephone Encounter (Signed)
Patient is calling because her right great toe will not stop bleeding since having removed 4 days ago.Please advise.  Returned call to patient and she said that it finally stopped. Instructed to keep elevated,keep soaking in epsom and to call back if it starts back,verbalized understanding.

## 2021-04-23 ENCOUNTER — Telehealth: Payer: Self-pay | Admitting: *Deleted

## 2021-04-23 NOTE — Telephone Encounter (Signed)
Returned call to patient and gave instructions per Dr Posey Pronto and that someone will contact her schedule appointment asap.

## 2021-04-23 NOTE — Telephone Encounter (Signed)
Patient is calling for possible infection in right procedural toe(04/06/21) is seeing pus and continuing to soak in warm soapy water. Please advise.

## 2021-04-25 MED ORDER — DOXYCYCLINE HYCLATE 100 MG PO TABS
100.0000 mg | ORAL_TABLET | Freq: Two times a day (BID) | ORAL | 0 refills | Status: DC
Start: 2021-04-25 — End: 2021-06-08

## 2021-04-30 ENCOUNTER — Other Ambulatory Visit: Payer: Self-pay

## 2021-04-30 ENCOUNTER — Other Ambulatory Visit: Payer: Medicare Other | Admitting: *Deleted

## 2021-04-30 DIAGNOSIS — Z79899 Other long term (current) drug therapy: Secondary | ICD-10-CM

## 2021-04-30 LAB — HEPATIC FUNCTION PANEL
ALT: 19 IU/L (ref 0–32)
AST: 17 IU/L (ref 0–40)
Albumin: 4.2 g/dL (ref 3.7–4.7)
Alkaline Phosphatase: 132 IU/L — ABNORMAL HIGH (ref 44–121)
Bilirubin Total: 0.3 mg/dL (ref 0.0–1.2)
Bilirubin, Direct: 0.11 mg/dL (ref 0.00–0.40)
Total Protein: 6.2 g/dL (ref 6.0–8.5)

## 2021-05-09 ENCOUNTER — Other Ambulatory Visit: Payer: Self-pay | Admitting: Family Medicine

## 2021-05-11 ENCOUNTER — Other Ambulatory Visit: Payer: Self-pay | Admitting: *Deleted

## 2021-05-11 DIAGNOSIS — R748 Abnormal levels of other serum enzymes: Secondary | ICD-10-CM

## 2021-05-16 ENCOUNTER — Ambulatory Visit (INDEPENDENT_AMBULATORY_CARE_PROVIDER_SITE_OTHER): Payer: Medicare Other | Admitting: Physician Assistant

## 2021-05-16 ENCOUNTER — Other Ambulatory Visit: Payer: Self-pay

## 2021-05-16 ENCOUNTER — Encounter: Payer: Self-pay | Admitting: Orthopaedic Surgery

## 2021-05-16 DIAGNOSIS — Z96651 Presence of right artificial knee joint: Secondary | ICD-10-CM

## 2021-05-16 NOTE — Progress Notes (Signed)
Post-Op Visit Note   Patient: Erin Wong           Date of Birth: Oct 07, 1950           MRN: [id number] Visit Date: 05/16/2021 PCP: [name], MD   Assessment & Plan:  Chief Complaint:  Chief Complaint  Patient presents with   Right Knee - Follow-up   Visit Diagnoses:  1. Hx of total knee replacement, right     Plan: Patient is a pleasant 70 year old female who comes in today 3 months status post right total knee replacement 02/19/2021.  She has been doing well.  She recently finished physical therapy but continues to work on a home exercise program.  Overall feeling very good.  Examination of the right knee reveals fully healed surgical scars without complication.  Range of motion 0 to 110 degrees.  She is stable valgus varus stress.  She is neurovascular intact distally.  At this point, she will continue to advance with activity as tolerated.  Dental prophylaxis reinforced.  Follow-up with [place] in 3 months time for repeat evaluation and 2 view x-rays of the right knee.  Call with concerns or questions.  Follow-Up Instructions: Return in about 3 months (around 08/15/2021).   Orders:  No orders of the defined types were placed in this encounter.  No orders of the defined types were placed in this encounter.   Imaging: No new imaging  PMFS History: Patient Active Problem List   Diagnosis Date Noted   Status post total right knee replacement 02/19/2021   Post-traumatic osteoarthritis of right knee 12/04/2020   Positive colorectal cancer screening using Cologuard test 07/02/2020   History of colectomy 07/02/2020   Colon cancer screening 07/02/2020   CHF (congestive heart failure) (HCC)    Acute respiratory failure with hypoxia ([place]) 02/12/2020   Acute bronchitis 99991111   Acute systolic heart failure ([place]) 01/19/2020   Atrial fibrillation with rapid ventricular response ([place]) 01/13/2020   CKD (chronic kidney disease) stage 3, GFR 30-59 ml/min (HCC)    Benign  essential HTN    HLD (hyperlipidemia)    Hyperlipidemia 02/12/2017   AKI (acute kidney injury) ([employer])    Chest pain 01/13/2017   Chest pressure    Acute kidney injury superimposed on CKD ([place])    Osteoporosis 10/18/2015   Vitamin D deficiency 07/20/2015   HTN (hypertension) 07/17/2015   GERD (gastroesophageal reflux disease) 01/12/2015   Chronic constipation 01/12/2015   History of colon surgery 01/12/2015   History of total hysterectomy with bilateral salpingo-oophorectomy (BSO) 01/12/2015   Allergy    Asthma    Past Medical History:  Diagnosis Date   Allergy    Arthritis    Asthma    Atrial fibrillation (HCC)    Benign essential HTN    CHF (congestive heart failure) (HCC)    CKD (chronic kidney disease) stage 3, GFR 30-59 ml/min (HCC)    Dysrhythmia     Afib -  had Ablation   GERD (gastroesophageal reflux disease)    HLD (hyperlipidemia)    HTN (hypertension)    Pneumonia     Family History  Problem Relation Age of Onset   Arthritis Mother    Miscarriages / [place] Mother    Lung cancer Mother        Had quit smoking for 25 years   Esophageal cancer Mother    Alcohol abuse Father    Alcohol abuse Brother    Kidney cancer Brother  Breast cancer Sister    COPD Brother    Asthma Maternal Aunt    Diabetes Maternal Aunt    Colon cancer Neg Hx    Inflammatory bowel disease Neg Hx    Liver disease Neg Hx    Pancreatic cancer Neg Hx    Rectal cancer Neg Hx    Stomach cancer Neg Hx     Past Surgical History:  Procedure Laterality Date   ABDOMINAL HYSTERECTOMY  10/04/1995   Hysterectomy and Bilateral oophorectomy   CARDIOVERSION N/A 01/17/2020   Procedure: CARDIOVERSION;  Surgeon: [name], MD;  Location: [facility] ENDOSCOPY;  Service: Cardiovascular;  Laterality: N/A;   COLON SURGERY  09/2009   hole in colon   FRACTURE SURGERY Right 08/02/2011   Right Leg--Plates & Screws   HAND SURGERY Right 09/02/2006   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--Arthritis    HARDWARE REMOVAL Right 12/04/2020   Procedure: rigth knee hardware removal;  Surgeon: Leandrew Koyanagi, MD;  Location: [place];  Service: Orthopedics;  Laterality: Right;   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   at time of colon surgery   TEE WITHOUT CARDIOVERSION N/A 01/17/2020   Procedure: TRANSESOPHAGEAL ECHOCARDIOGRAM (TEE);  Surgeon: [name], MD;  Location: [facility] ENDOSCOPY;  Service: Cardiovascular;  Laterality: N/A;   TOTAL KNEE ARTHROPLASTY Right 02/19/2021   Procedure: RIGHT TOTAL KNEE ARTHROPLASTY;  Surgeon: Leandrew Koyanagi, MD;  Location: [place];  Service: Orthopedics;  Laterality: Right;   Social History   Occupational History   Occupation: retired  Tobacco Use   Smoking status: Former    Types: Cigarettes    Quit date: 01/10/1991    Years since quitting: 30.3   Smokeless tobacco: Never  Vaping Use   Vaping Use: Never used  Substance and Sexual Activity   Alcohol use: Not Currently    Comment: quit in May/2021   Drug use: No   Sexual activity: Never

## 2021-05-21 ENCOUNTER — Other Ambulatory Visit: Payer: Self-pay | Admitting: Family Medicine

## 2021-05-30 NOTE — Progress Notes (Signed)
agree

## 2021-06-05 ENCOUNTER — Telehealth: Payer: Self-pay | Admitting: *Deleted

## 2021-06-05 NOTE — Telephone Encounter (Signed)
Received call from patient daughter, [place].   Reports that patient had viral infection last week and is noted very weak this week.   Advised that weakness is expected after illness. Advised if Sx persist >1 week, contact office for appointment.

## 2021-06-08 ENCOUNTER — Encounter: Payer: Self-pay | Admitting: Family Medicine

## 2021-06-08 ENCOUNTER — Ambulatory Visit (INDEPENDENT_AMBULATORY_CARE_PROVIDER_SITE_OTHER): Payer: Medicare Other | Admitting: Family Medicine

## 2021-06-08 ENCOUNTER — Other Ambulatory Visit: Payer: Self-pay

## 2021-06-08 VITALS — BP 128/70 | HR 68 | Temp 99.1°F | Resp 16 | Ht 64.0 in | Wt 142.0 lb

## 2021-06-08 DIAGNOSIS — J019 Acute sinusitis, unspecified: Secondary | ICD-10-CM | POA: Diagnosis not present

## 2021-06-08 DIAGNOSIS — B9689 Other specified bacterial agents as the cause of diseases classified elsewhere: Secondary | ICD-10-CM

## 2021-06-08 MED ORDER — AMOXICILLIN-POT CLAVULANATE 875-125 MG PO TABS: 1.0000 | ORAL_TABLET | Freq: Two times a day (BID) | ORAL | 0 refills | Status: DC

## 2021-06-08 NOTE — Progress Notes (Signed)
Subjective:    Patient ID: Erin Wong, female    DOB: June 01, 1951, 70 y.o.   MRN: [id number]  Patient has a history of community-acquired pneumonia last year.  About 3 weeks ago she developed an upper respiratory infection characterized by head congestion, rhinorrhea, sore throat, and coughing.  She states that she has become increasingly fatigued.  She reports continued cough and chest congestion.  She also reports severe pain in both sinuses.  On examination she is extremely tender to palpation and percussion in both maxillary sinuses out of proportion to exam.  She has wheezing in her right lung and rhonchorous breath sounds in her left lung.  Her husband has similar symptoms however his exam is completely normal.  She continues to run low-grade fever between 99 and 99.5.  She is taken several COVID test which have been negative. Past Medical History:  Diagnosis Date  . Allergy   . Arthritis   . Asthma   . Atrial fibrillation ([place])   . Benign essential HTN   . CHF (congestive heart failure) ([place])   . CKD (chronic kidney disease) stage 3, GFR 30-59 ml/min (HCC)   . Dysrhythmia     Afib -  had Ablation  . GERD (gastroesophageal reflux disease)   . HLD (hyperlipidemia)   . HTN (hypertension)   . Pneumonia    Past Surgical History:  Procedure Laterality Date  . ABDOMINAL HYSTERECTOMY  10/04/1995   Hysterectomy and Bilateral oophorectomy  . CARDIOVERSION N/A 01/17/2020   Procedure: CARDIOVERSION;  Surgeon: [name], MD;  Location: [facility] ENDOSCOPY;  Service: Cardiovascular;  Laterality: N/A;  . COLON SURGERY  09/2009   hole in colon  . FRACTURE SURGERY Right 08/02/2011   Right Leg--Plates & Screws  . 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 HARDWARE REMOVAL Right 12/04/2020   Procedure: rigth knee hardware removal;  Surgeon: Leandrew Koyanagi, MD;  Location: [place];  Service: Orthopedics;  Laterality: Right;  . 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   at time of  colon surgery  . TEE WITHOUT CARDIOVERSION N/A 01/17/2020   Procedure: TRANSESOPHAGEAL ECHOCARDIOGRAM (TEE);  Surgeon: [name], MD;  Location[facility] ENDOSCOPY;  Service: Cardiovascular;  Laterality: N/A;  . TOTAL KNEE ARTHROPLASTY Right 02/19/2021   Procedure: RIGHT TOTAL KNEE ARTHROPLASTY;  Surgeon: Leandrew Koyanagi, MD;  Location: [place];  Service: Orthopedics;  Laterality: Right;   Current Outpatient Medications on File Prior to Visit  Medication Sig Dispense Refill  . albuterol (VENTOLIN HFA) 108 (90 Base) MCG/ACT inhaler Inhale 1 puff into the lungs every 6 (six) hours as needed for wheezing or shortness of breath. 18 g 3  . amiodarone (PACERONE) 200 MG tablet Take 1/2 tablet daily (Patient taking differently: Take 100 mg by mouth daily. Take 1/2 tablet daily) 90 tablet 3  . apixaban (ELIQUIS) 5 MG TABS tablet Take 1 tablet (5 mg total) by mouth 2 (two) times daily. 60 tablet 11  . atorvastatin (LIPITOR) 40 MG tablet Take 1 tablet (40 mg total) by mouth daily. 90 tablet 1  . benazepril (LOTENSIN) 20 MG tablet Take 1 tablet (20 mg total) by mouth daily. 90 tablet 1  . Biotin 5000 MCG TABS Take 5,000 mcg by mouth daily.    Marland Kitchen CALCIUM GLUCONATE PO Take 600 mg by mouth daily.    . carvedilol (COREG) 3.125 MG tablet Take 1 tablet (3.125 mg total) by mouth 2 (two) times daily. 180 tablet 3  .  Cholecalciferol 25 MCG (1000 UT) tablet Take 1 tablet (1,000 Units total) by mouth daily. 90 tablet 1  . docusate sodium (COLACE) 100 MG capsule Take 1 capsule (100 mg total) by mouth daily as needed. 30 capsule 2  . fluticasone (FLONASE) 50 MCG/ACT nasal spray Place 2 sprays into both nostrils daily as needed for allergies. 16 g 3  . fluticasone-salmeterol (ADVAIR) 100-50 MCG/ACT AEPB INHALE 1 PUFF INTO THE LUNGS TWICE A DAY 180 each 1  . furosemide (LASIX) 40 MG tablet TAKE 1 TABLET BY MOUTH EVERY DAY (Patient taking differently: Take 40 mg by mouth daily.) 90 tablet 3  . omeprazole (PRILOSEC) 20 MG capsule  TAKE 1 CAPSULE BY MOUTH EVERY DAY (Patient taking differently: Take 20 mg by mouth daily.) 90 capsule 3  . ondansetron (ZOFRAN) 4 MG tablet Take 1 tablet (4 mg total) by mouth every 8 (eight) hours as needed for nausea or vomiting. 40 tablet 0  . polyethylene glycol (MIRALAX / GLYCOLAX) 17 g packet Take 17 g by mouth daily.     No current facility-administered medications on file prior to visit.   No Known Allergies Social History   Socioeconomic History  . Marital status: Married    Spouse name: Not on file  . Number of children: 3  . Years of education: Not on file  . Highest education level: Not on file  Occupational History  . Occupation: retired  Tobacco Use  . Smoking status: Former    Types: Cigarettes    Quit date: 01/10/1991    Years since quitting: 30.4  . Smokeless tobacco: Never  Vaping Use  . Vaping Use: Never used  Substance and Sexual Activity  . Alcohol use: Not Currently    Comment: quit in May/2021  . Drug use: No  . Sexual activity: Never  Other Topics Concern  . Not on file  Social History Narrative  . Not on file   Social Determinants of Health   Financial Resource Strain: Not on file  Food Insecurity: Not on file  Transportation Needs: Not on file  Physical Activity: Not on file  Stress: Not on file  Social Connections: Not on file  Intimate Partner Violence: Not on file     Review of Systems  All other systems reviewed and are negative.     Objective:   Physical Exam Vitals reviewed.  Constitutional:      Appearance: Normal appearance. She is well-developed. She is obese.  HENT:     Right Ear: Tympanic membrane and ear canal normal.     Left Ear: Tympanic membrane and ear canal normal.     Nose: Congestion and rhinorrhea present.     Right Sinus: Maxillary sinus tenderness present.     Left Sinus: Maxillary sinus tenderness present.     Mouth/Throat:     Pharynx: No oropharyngeal exudate.  Cardiovascular:     Rate and Rhythm:  Normal rate. Rhythm irregular.     Heart sounds: Normal heart sounds. No murmur heard.   No friction rub. No gallop.  Pulmonary:     Effort: Pulmonary effort is normal. No respiratory distress.     Breath sounds: Wheezing and rhonchi present. No rales.  Musculoskeletal:     Right lower leg: Edema present.     Left lower leg: Edema present.  Neurological:     Mental Status: She is alert.          Assessment & Plan:  Acute bacterial rhinosinusitis I believe the patient  most likely had a viral upper respiratory infection however today her examination is significant for severe tenderness to percussion over both maxillary sinuses.  She also reports postnasal drainage and a worsening cough with worsening fatigue.  I will treat the patient for possible sinusitis with Augmentin 875 mg twice daily for 10 days.  Recheck next week if no better or sooner if worse

## 2021-06-25 ENCOUNTER — Other Ambulatory Visit: Payer: Self-pay | Admitting: Family Medicine

## 2021-06-25 ENCOUNTER — Other Ambulatory Visit: Payer: Self-pay | Admitting: *Deleted

## 2021-06-25 MED ORDER — TRAMADOL HCL 50 MG PO TABS: ORAL_TABLET | ORAL | 3 refills | Status: DC

## 2021-06-25 NOTE — Telephone Encounter (Signed)
Received call from patient.   Requested refill on Tramadol prescription from PCP.   States that she was receiving Tramadol from Dr. Erlinda Hong, but she is no longer under his care.   Ok to refill?

## 2021-08-02 DIAGNOSIS — Z23 Encounter for immunization: Secondary | ICD-10-CM | POA: Diagnosis not present

## 2021-08-07 ENCOUNTER — Other Ambulatory Visit: Payer: Self-pay

## 2021-08-07 MED ORDER — BENAZEPRIL HCL 20 MG PO TABS: 20.0000 mg | ORAL_TABLET | Freq: Every day | ORAL | 1 refills | Status: DC

## 2021-08-15 ENCOUNTER — Other Ambulatory Visit: Payer: Self-pay

## 2021-08-15 ENCOUNTER — Encounter: Payer: Self-pay | Admitting: Orthopaedic Surgery

## 2021-08-15 ENCOUNTER — Ambulatory Visit (INDEPENDENT_AMBULATORY_CARE_PROVIDER_SITE_OTHER): Payer: Medicare Other

## 2021-08-15 ENCOUNTER — Ambulatory Visit (INDEPENDENT_AMBULATORY_CARE_PROVIDER_SITE_OTHER): Payer: Medicare Other | Admitting: Orthopaedic Surgery

## 2021-08-15 DIAGNOSIS — Z96651 Presence of right artificial knee joint: Secondary | ICD-10-CM

## 2021-08-15 NOTE — Progress Notes (Signed)
Office Visit Note   Patient: Erin Wong           Date of Birth: [birth year]           MRN: [id number] Visit Date: 08/15/2021              Requested by: [name], MD [id number] [place],   [id number] PCP: [name], MD   Assessment & Plan: Visit Diagnoses:  1. Hx of total knee replacement, right     Plan: [place] is now 6 months status post right total knee replacement for severe posttraumatic valgus DJD.  She is doing really well and very happy and reports no pain.  She is resume normal activities.  She is not using any walking devices.  Right knee shows a fully healed surgical scar.  She has excellent range of motion from 0 to 125 degrees.  Stable to varus valgus.  No swelling.  X-rays demonstrate stable right total knee replacement without any complications.  [place]ne is doing very well with her recovery.  Dental prophylaxis reinforced.  Activity as tolerated.  Recheck in 6 months with two-view x-rays of the right knee.  Follow-Up Instructions: Return in about 6 months (around 02/13/2022).   Orders:  Orders Placed This Encounter  Procedures   XR Knee 1-2 Views Right   No orders of the defined types were placed in this encounter.     Procedures: No procedures performed   Clinical Data: No additional findings.   Subjective: Chief Complaint  Patient presents with   Right Knee - Pain    HPI  Review of Systems   Objective: Vital Signs: There were no vitals taken for this visit.  Physical Exam  Ortho Exam  Specialty Comments:  No specialty comments available.  Imaging: XR Knee 1-2 Views Right  Result Date: 08/15/2021 Stable total knee replacement in good alignment     PMFS History: Patient Active Problem List   Diagnosis Date Noted   Status post total right knee replacement 02/19/2021   Post-traumatic osteoarthritis of right knee 12/04/2020   Positive colorectal cancer screening using Cologuard test 07/02/2020    History of colectomy 07/02/2020   Colon cancer screening 07/02/2020   CHF (congestive heart failure) (HCC)    Acute respiratory failure with hypoxia ([place]) 02/12/2020   Acute bronchitis 53/61/4431   Acute systolic heart failure ([place]) 01/19/2020   Atrial fibrillation with rapid ventricular response ([place]) 01/13/2020   CKD (chronic kidney disease) stage 3, GFR 30-59 ml/min (HCC)    Benign essential HTN    HLD (hyperlipidemia)    Hyperlipidemia 02/12/2017   AKI (acute kidney injury) ([place])    Chest pain 01/13/2017   Chest pressure    Acute kidney injury superimposed on CKD ([place])    Osteoporosis 10/18/2015   Vitamin D deficiency 07/20/2015   HTN (hypertension) 07/17/2015   GERD (gastroesophageal reflux disease) 01/12/2015   Chronic constipation 01/12/2015   History of colon surgery 01/12/2015   History of total hysterectomy with bilateral salpingo-oophorectomy (BSO) 01/12/2015   Allergy    Asthma    Past Medical History:  Diagnosis Date   Allergy    Arthritis    Asthma    Atrial fibrillation (HCC)    Benign essential HTN    CHF (congestive heart failure) (HCC)    CKD (chronic kidney disease) stage 3, GFR 30-59 ml/min (HCC)    Dysrhythmia     Afib -  had Ablation   GERD (  gastroesophageal reflux disease)    HLD (hyperlipidemia)    HTN (hypertension)    Pneumonia     Family History  Problem Relation Age of Onset   Arthritis Mother    Miscarriages / [place] Mother    Lung cancer Mother        Had quit smoking for 25 years   Esophageal cancer Mother    Alcohol abuse Father    Alcohol abuse Brother    Kidney cancer Brother    Breast cancer Sister    COPD Brother    Asthma Maternal Aunt    Diabetes Maternal Aunt    Colon cancer Neg Hx    Inflammatory bowel disease Neg Hx    Liver disease Neg Hx    Pancreatic cancer Neg Hx    Rectal cancer Neg Hx    Stomach cancer Neg Hx     Past Surgical History:  Procedure Laterality Date   ABDOMINAL HYSTERECTOMY  10/04/1995    Hysterectomy and Bilateral oophorectomy   CARDIOVERSION N/A 01/17/2020   Procedure: CARDIOVERSION;  Surgeon: [name], MD;  Location[facility] ENDOSCOPY;  Service: Cardiovascular;  Laterality: N/A;   COLON SURGERY  09/2009   hole in colon   FRACTURE SURGERY Right 08/02/2011   Right Leg--Plates & Screws   HAND SURGERY Right 09/02/2006   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--Arthritis   HARDWARE REMOVAL Right 12/04/2020   Procedure: rigth knee hardware removal;  Surgeon: Leandrew Koyanagi, MD;  Location: [place];  Service: Orthopedics;  Laterality: Right;   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   at time of colon surgery   TEE WITHOUT CARDIOVERSION N/A 01/17/2020   Procedure: TRANSESOPHAGEAL ECHOCARDIOGRAM (TEE);  Surgeon: [name], MD;  Location: [facility] ENDOSCOPY;  Service: Cardiovascular;  Laterality: N/A;   TOTAL KNEE ARTHROPLASTY Right 02/19/2021   Procedure: RIGHT TOTAL KNEE ARTHROPLASTY;  Surgeon: Leandrew Koyanagi, MD;  Location: [place];  Service: Orthopedics;  Laterality: Right;   Social History   Occupational History   Occupation: retired  Tobacco Use   Smoking status: Former    Types: Cigarettes    Quit date: 01/10/1991    Years since quitting: 30.6   Smokeless tobacco: Never  Vaping Use   Vaping Use: Never used  Substance and Sexual Activity   Alcohol use: Not Currently    Comment: quit in May/2021   Drug use: No   Sexual activity: Never

## 2021-08-24 ENCOUNTER — Ambulatory Visit: Payer: Medicare Other | Admitting: Podiatry

## 2021-09-04 ENCOUNTER — Other Ambulatory Visit: Payer: Self-pay

## 2021-09-04 MED ORDER — ATORVASTATIN CALCIUM 40 MG PO TABS: 40.0000 mg | ORAL_TABLET | Freq: Every day | ORAL | 1 refills | Status: DC

## 2021-09-07 ENCOUNTER — Ambulatory Visit (INDEPENDENT_AMBULATORY_CARE_PROVIDER_SITE_OTHER): Payer: Medicare Other | Admitting: Podiatry

## 2021-09-07 ENCOUNTER — Other Ambulatory Visit: Payer: Self-pay

## 2021-09-07 DIAGNOSIS — L600 Ingrowing nail: Secondary | ICD-10-CM

## 2021-09-12 NOTE — Progress Notes (Signed)
Subjective: Erin Wong is a 71 y.o.  female returns to office today for follow up evaluation after having bilateral 1st total nail avulsion performed. Patient has been soaking using epsom salt and applying topical antibiotic covered with bandaid daily. Patient denies fevers, chills, nausea, vomiting. Denies any calf pain, chest pain, SOB.   Objective:  Vitals: Reviewed  General: Well developed, nourished, in no acute distress, alert and oriented x3   Dermatology: Skin is warm, dry and supple bilateral. bilateral 1st nail bed appears to be clean, dry, with mild granular tissue and surrounding scab. There is no surrounding erythema, edema, drainage/purulence. The remaining nails appear unremarkable at this time. There are no other lesions or other signs of infection present.  Neurovascular status: Intact. No lower extremity swelling; No pain with calf compression bilateral.  Musculoskeletal: Decreased tenderness to palpation of the bilateral 1st nail bed. Muscular strength within normal limits bilateral.   Assesement and Plan: S/p total nail avulsion, doing well.   -Continue soaking in epsom salts twice a day followed by antibiotic ointment and a band-aid. Can leave uncovered at night. Continue this until completely healed.  -If the area has not healed in 2 weeks, call the office for follow-up appointment, or sooner if any problems arise.  -Monitor for any signs/symptoms of infection. Call the office immediately if any occur or go directly to the emergency room. Call with any questions/concerns.  Boneta Lucks, DPM

## 2021-09-21 DIAGNOSIS — Z20822 Contact with and (suspected) exposure to covid-19: Secondary | ICD-10-CM | POA: Diagnosis not present

## 2021-09-26 IMAGING — DX DG CHEST 2V
2 series · 2 of 2 positions shown · non-contrast
Comparison: 01/13/2020, 01/13/2017

CLINICAL DATA: Shortness of breath and productive cough for 2 days.

EXAM:
CHEST - 2 VIEW

[w chest pa]
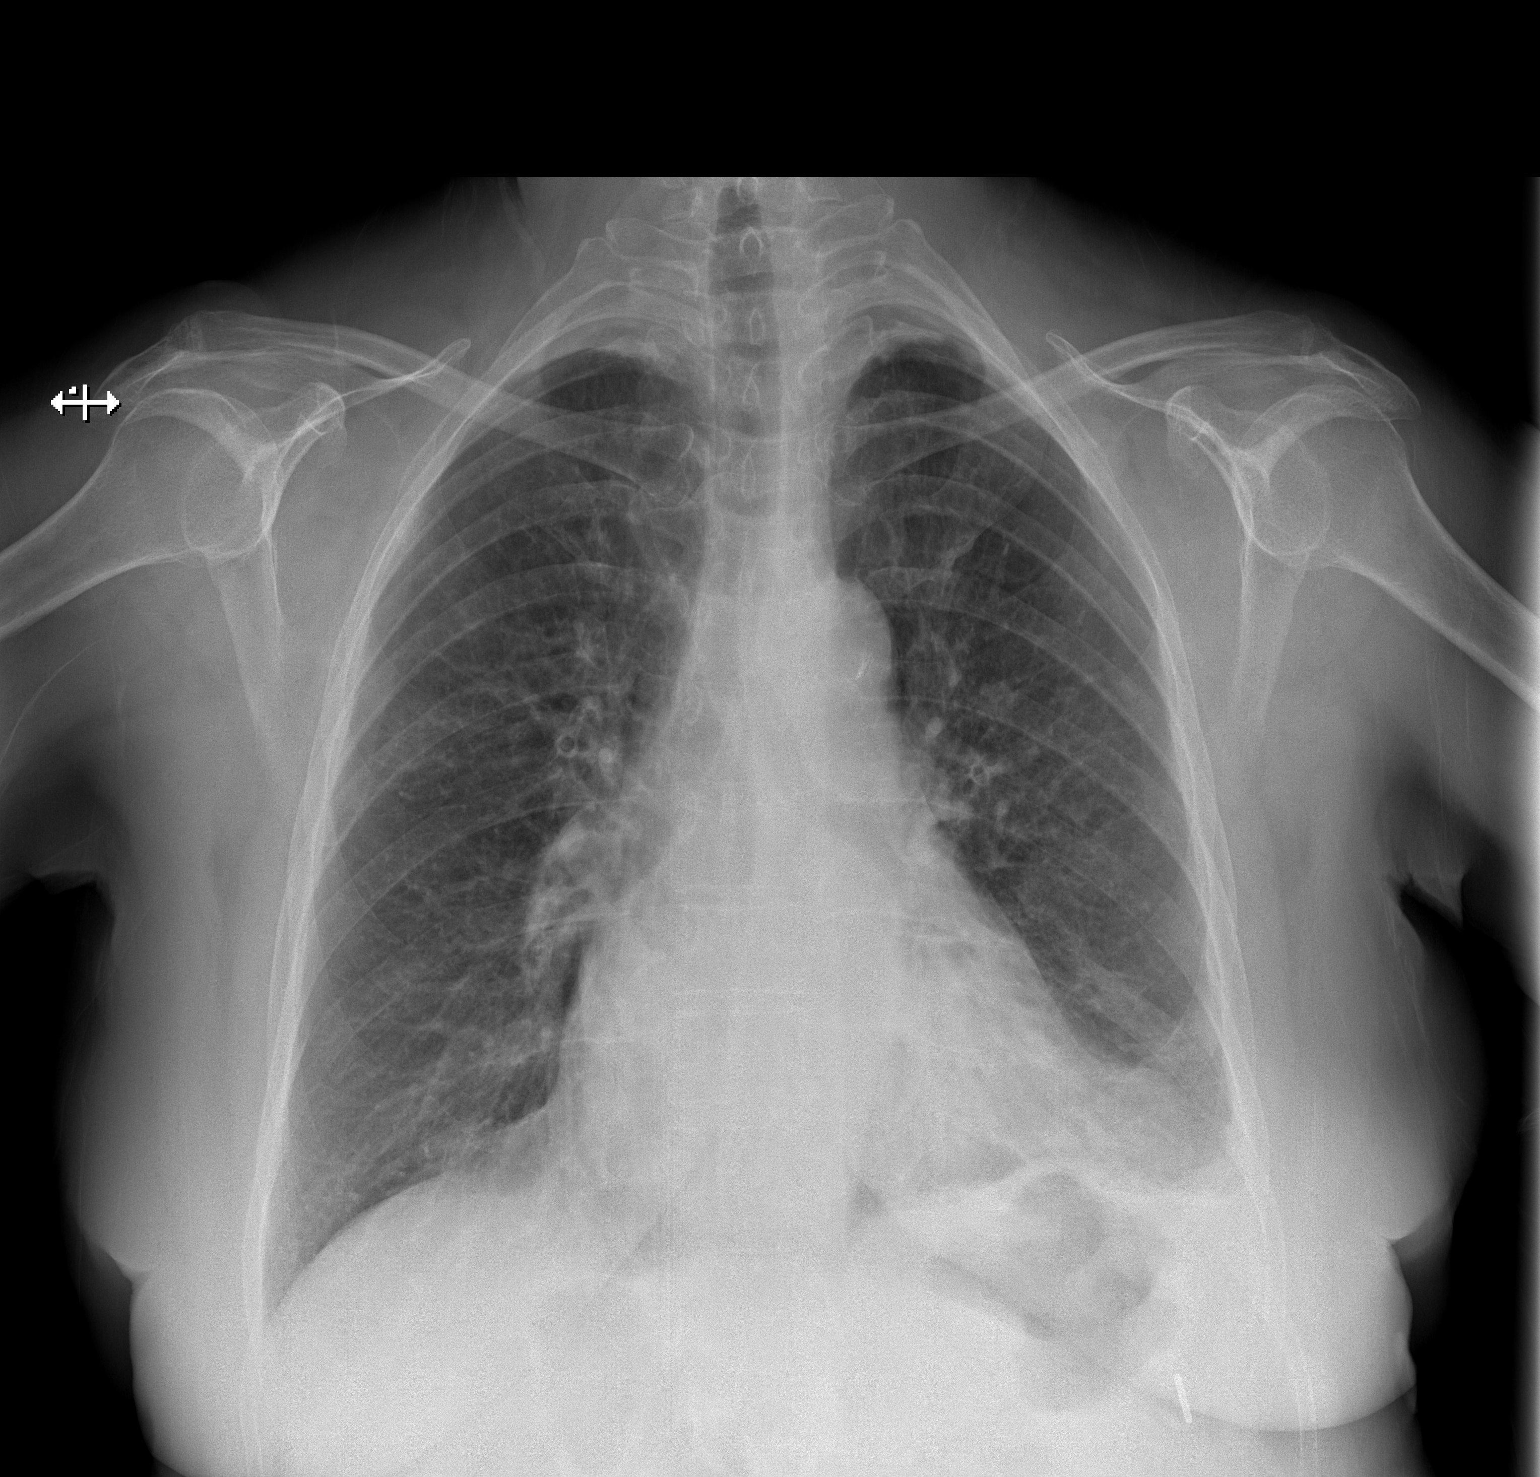

[w chest lat]
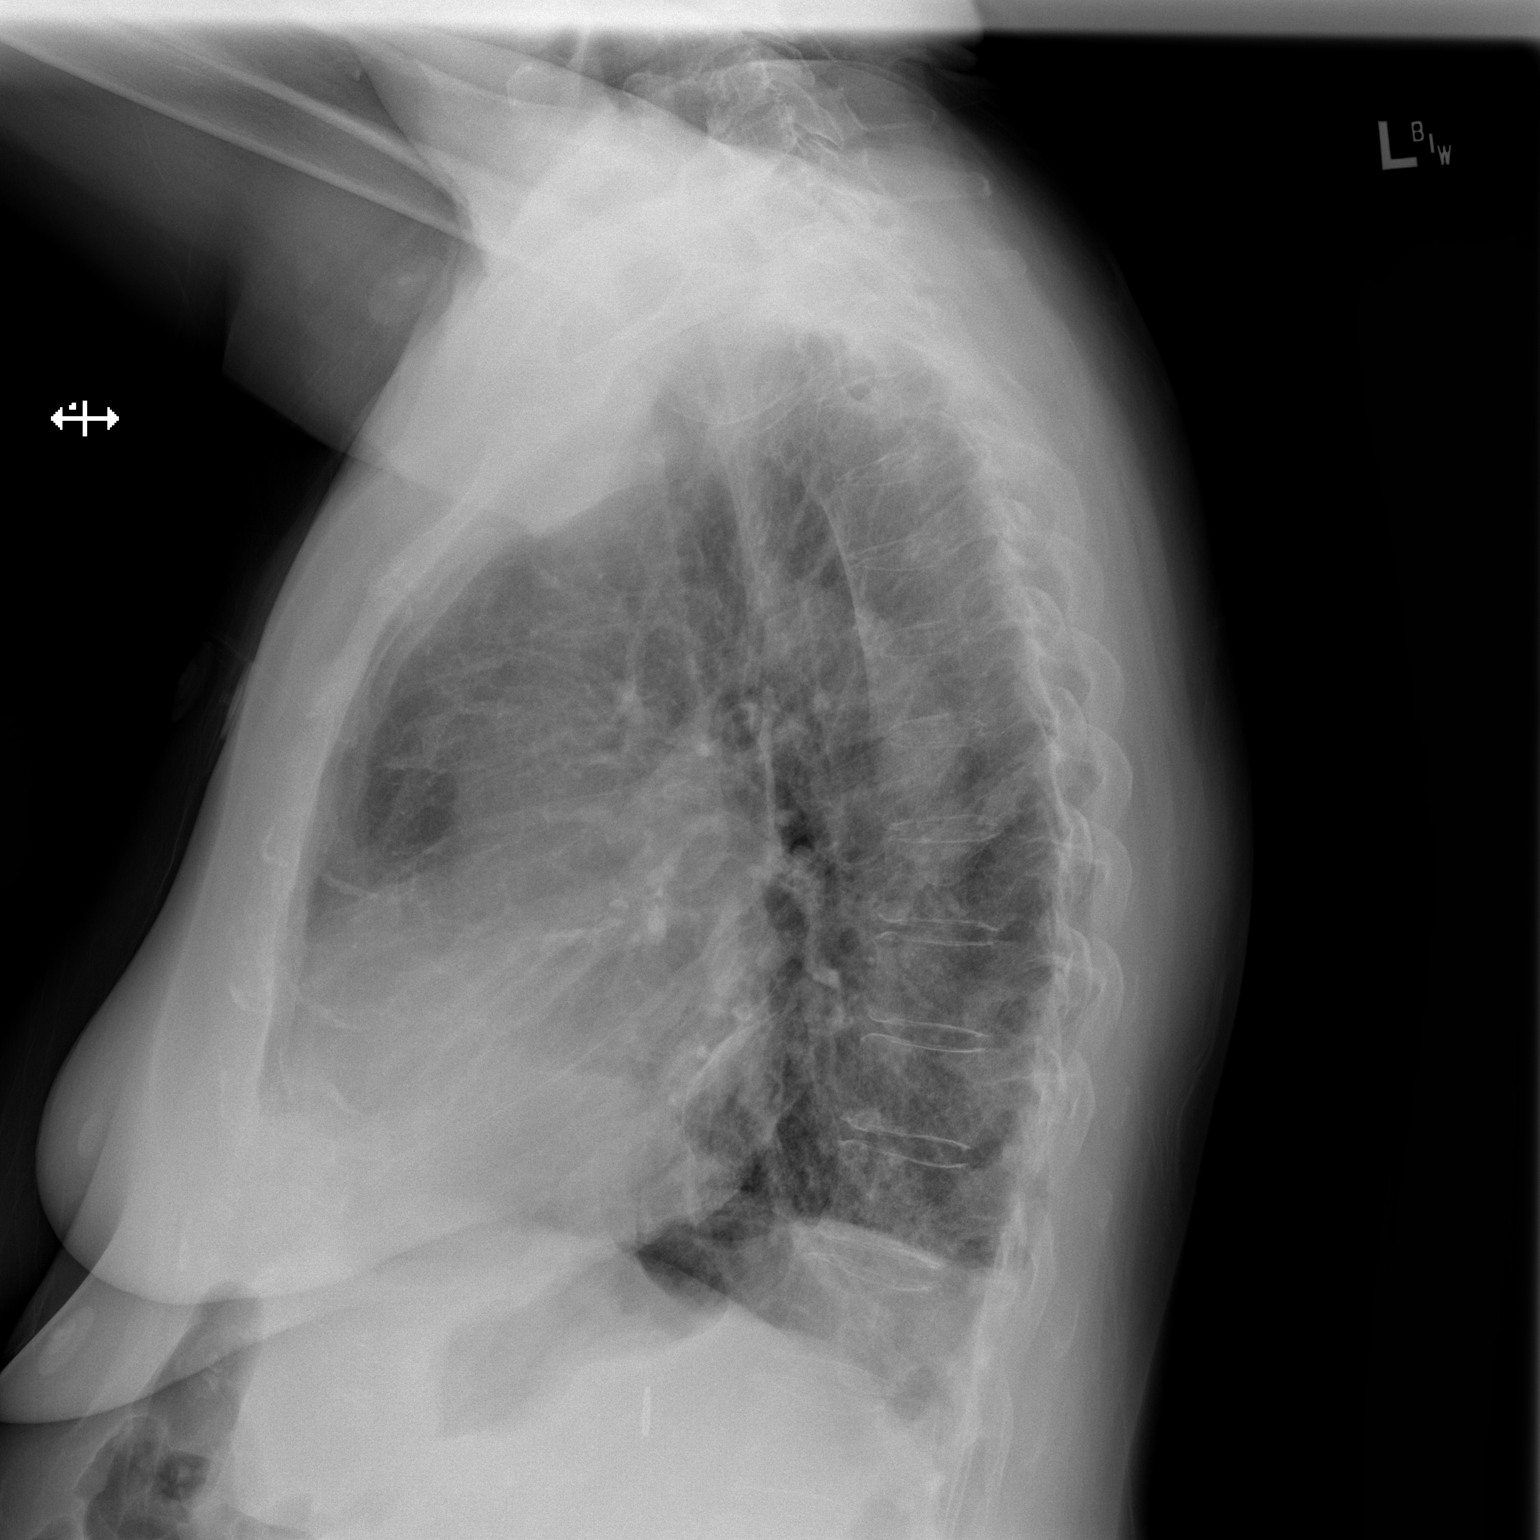

[2 of 2 positions shown; findings below may reference images not displayed]

FINDINGS: Mild cardiomegaly, unchanged from prior allowing for differences in
technique. No pulmonary edema. Chronic left basilar
pleuroparenchymal scarring. Mild biapical pleuroparenchymal
scarring. No acute airspace disease. No pneumothorax. No significant
pleural fluid or confluent airspace disease. Osseous structures are
intact.
IMPRESSION: 1. No acute abnormality.
2. Stable mild cardiomegaly and left basilar pleuroparenchymal
scarring.

## 2021-09-27 IMAGING — CT CT ANGIO CHEST
2 of 7 series · 18 of 46 positions shown · IV contrast (APPLIED)
Comparison: None.

CLINICAL DATA: Shortness of breath.

EXAM:
CT ANGIOGRAPHY CHEST WITH CONTRAST
TECHNIQUE: Multidetector CT imaging of the chest was performed using the
standard protocol during bolus administration of intravenous
contrast. Multiplanar CT image reconstructions and MIPs were
obtained to evaluate the vascular anatomy.
CONTRAST:  100mL OMNIPAQUE IOHEXOL 350 MG/ML SOLN

[Series 7: thins · axial · 0.75mm/px · z∈[+210,+498]mm · 15 of 464 slices shown]
[im 26/464  lung]
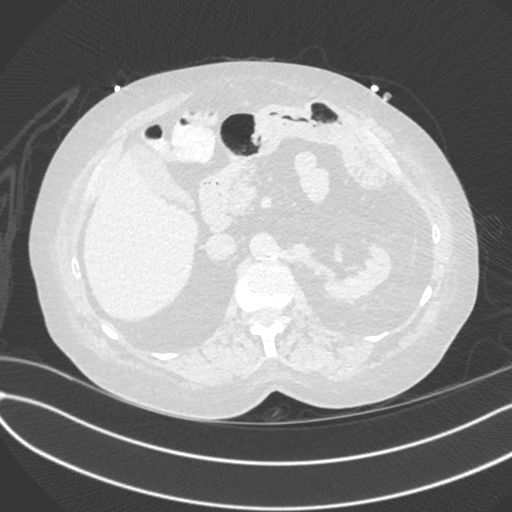
[im 52/464  soft-tissue]
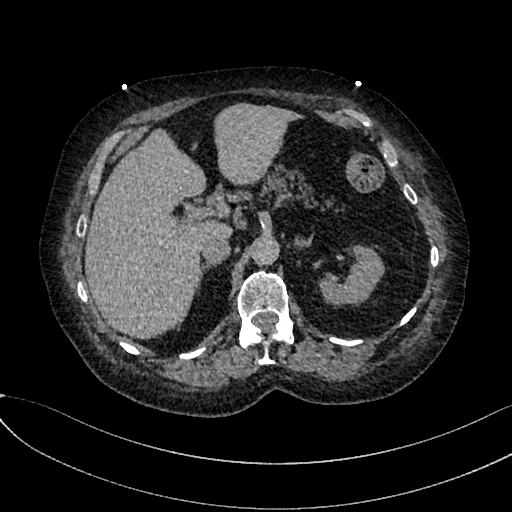
[im 78/464  lung]
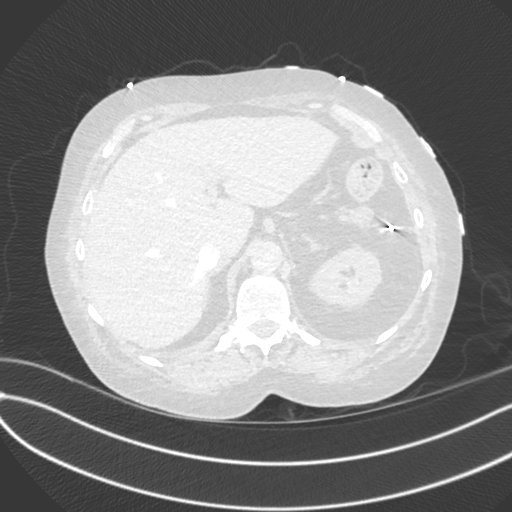
[im 103/464  soft-tissue]
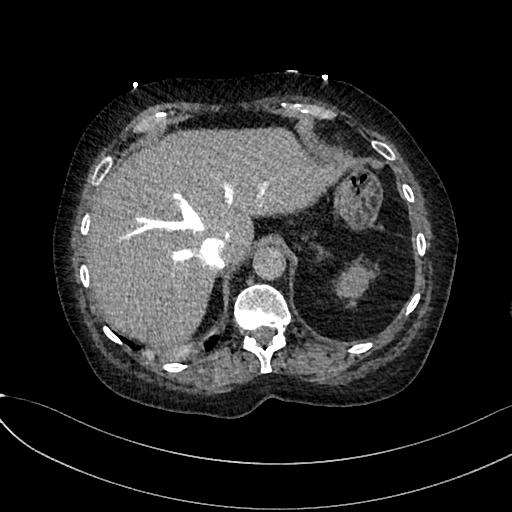
[im 155/464  lung]
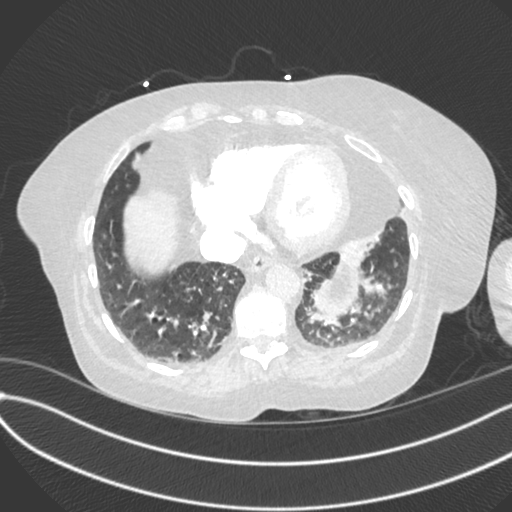
[im 181/464  soft-tissue]
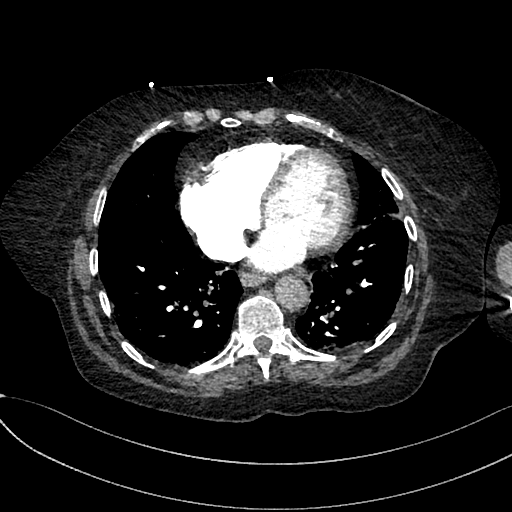
[im 206/464  lung]
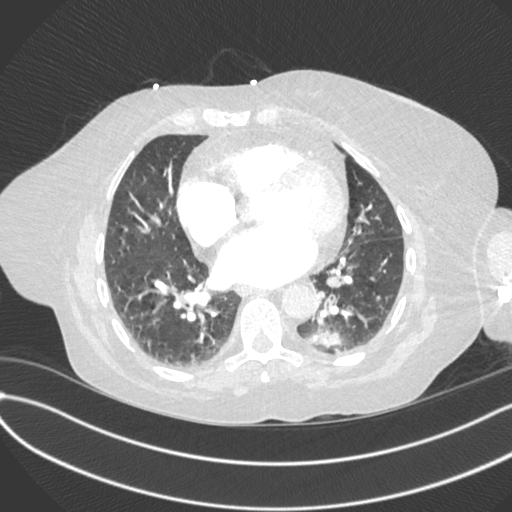
[im 232/464  soft-tissue]
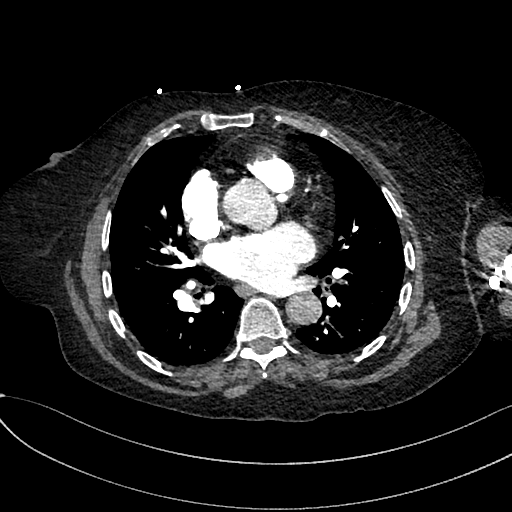
[im 258/464  lung]
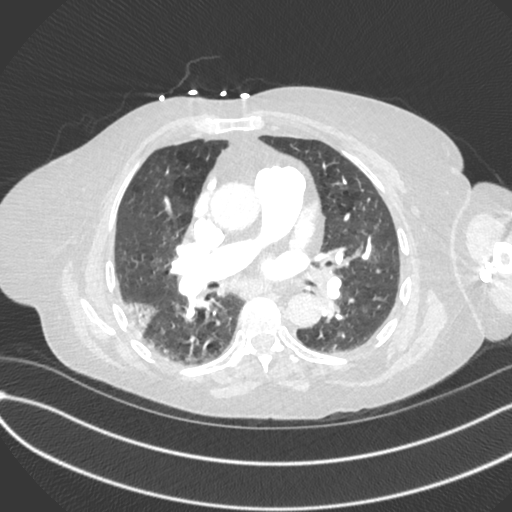
[im 283/464  soft-tissue]
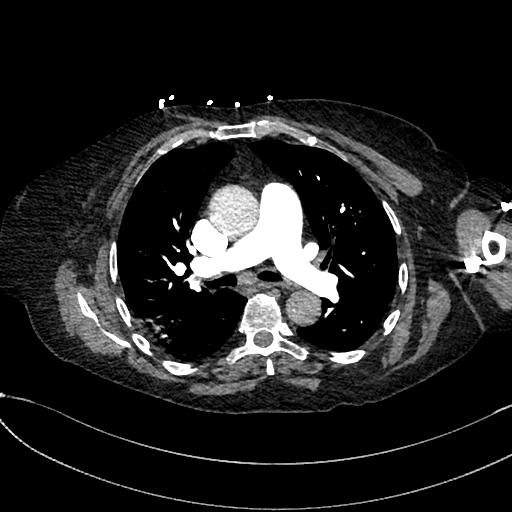
[im 309/464  lung]
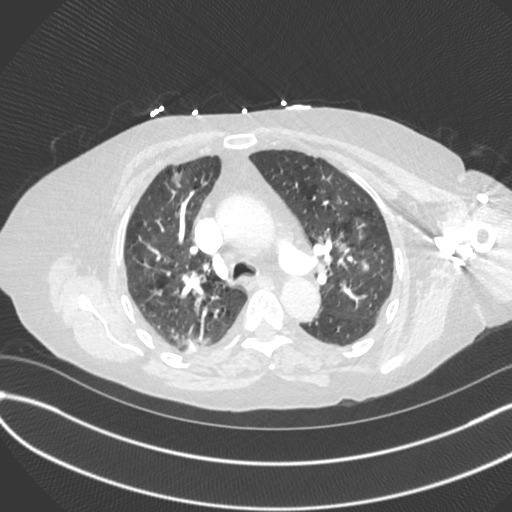
[im 361/464  soft-tissue]
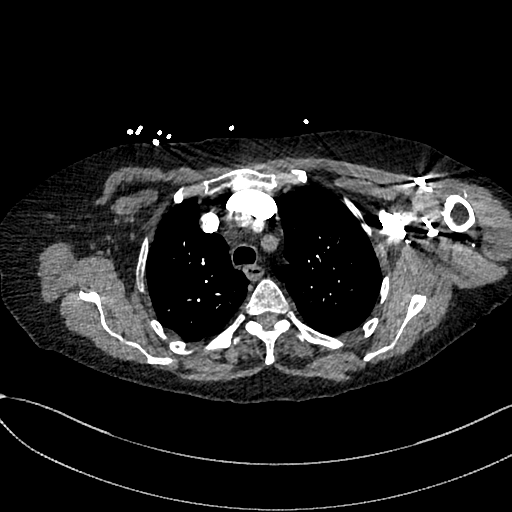
[im 386/464  lung]
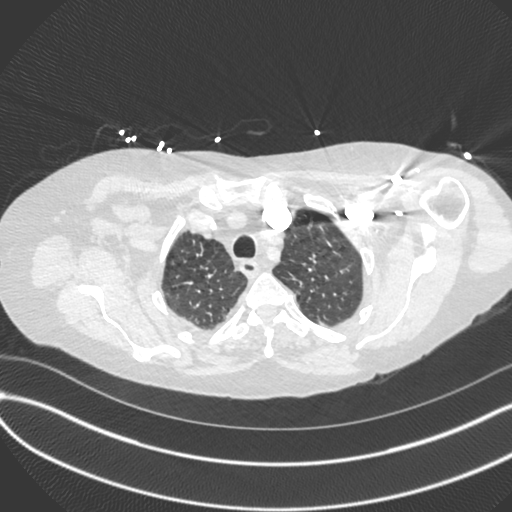
[im 412/464  soft-tissue]
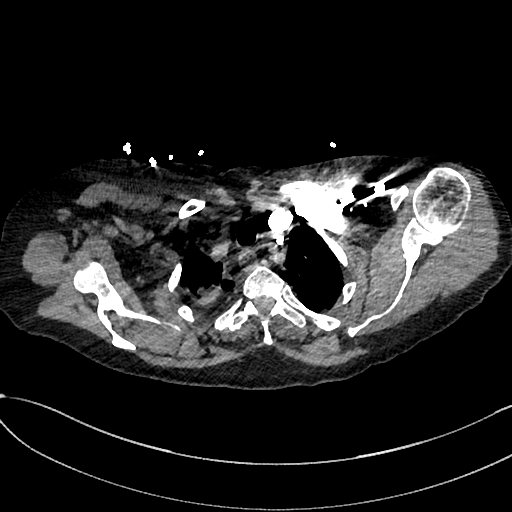
[im 438/464  lung]
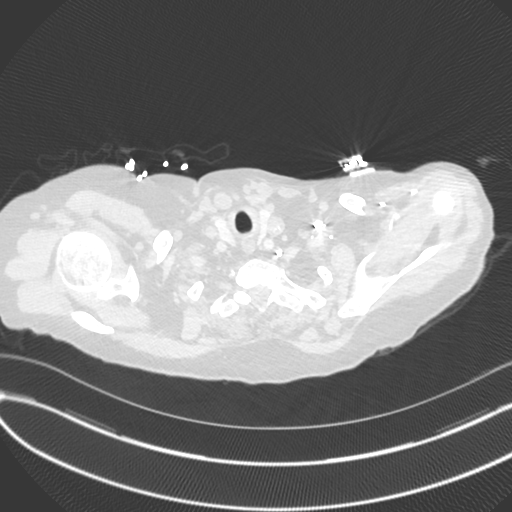

[Series 8: cor · coronal · 0.62mm/px · 3 of 141 slices shown]
[im 36/141  soft-tissue]
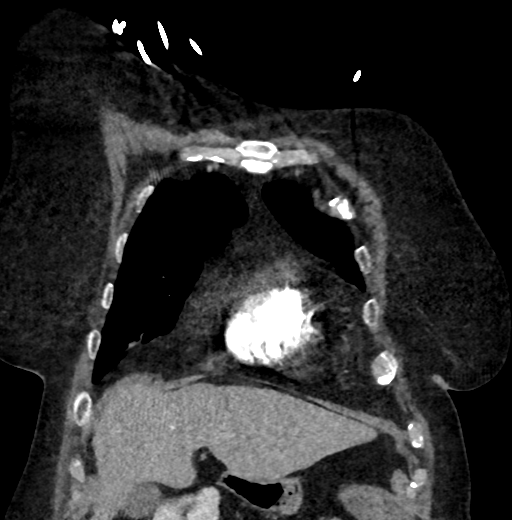
[im 71/141  soft-tissue]
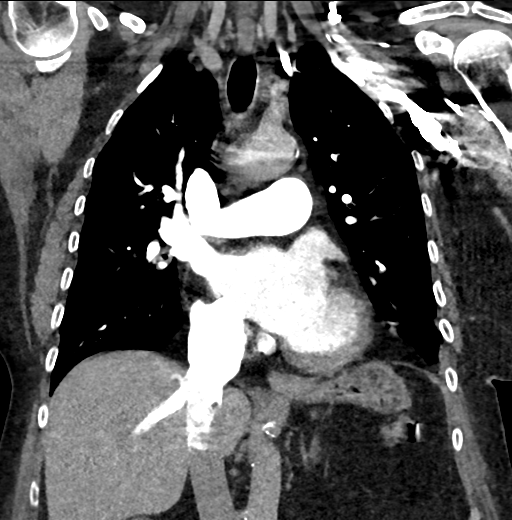
[im 106/141  soft-tissue]
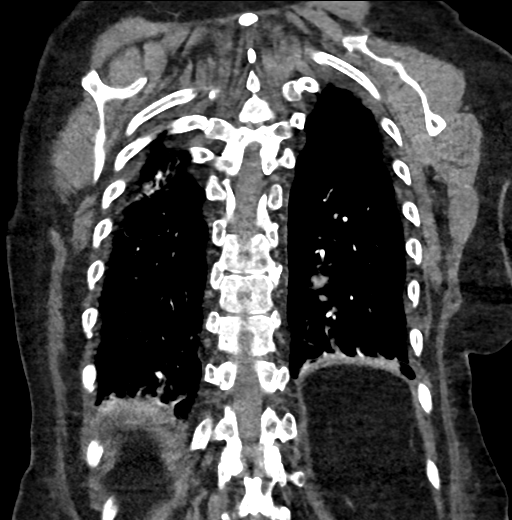

[18 of 46 positions shown; findings below may reference images not displayed]

FINDINGS: Cardiovascular: Contrast injection is sufficient to demonstrate
satisfactory opacification of the pulmonary arteries to the
segmental level. There is no pulmonary embolus or evidence of right
heart strain. The size of the main pulmonary artery is normal. Heart
size is normal, with no pericardial effusion. There are coronary
artery calcifications. There are atherosclerotic changes of the
thoracic aorta without evidence for an aneurysm there is significant
reflux of contrast into the IVC.

Mediastinum/Nodes:

-- No mediastinal lymphadenopathy.

-- No hilar lymphadenopathy.

-- No axillary lymphadenopathy.

-- No supraclavicular lymphadenopathy.

-- Normal thyroid gland where visualized.

-  Unremarkable esophagus.

Lungs/Pleura: There are emphysematous changes. There is
consolidation in the right upper lobe and left lower lobe with areas
of bronchial wall thickening and mucus plugging. There is no
pneumothorax. There is no significant pleural effusion.

Upper Abdomen: Contrast bolus timing is not optimized for evaluation
of the abdominal organs. The visualized portions of the organs of
the upper abdomen are normal.

Musculoskeletal: The patient is status post prior splenectomy. There
are few small splenules in left upper quadrant. There is an apparent
large ventral wall hernia containing fat. However, this hernia is
only partially visualized.

Review of the MIP images confirms the above findings.
IMPRESSION: 1. No acute pulmonary embolism.
2. Findings concerning for infectious bronchiolitis (viral or
bacterial) with areas of consolidation involving the right upper and
left lower lobes.
3. Significant reflux of contrast into the IVC, suggestive of right
heart dysfunction.

Aortic Atherosclerosis (JCKPY-TCX.X) and Emphysema (JCKPY-4Z0.P).

## 2021-10-26 ENCOUNTER — Encounter: Payer: Self-pay | Admitting: Family Medicine

## 2021-10-26 ENCOUNTER — Other Ambulatory Visit: Payer: Self-pay

## 2021-10-26 ENCOUNTER — Ambulatory Visit (INDEPENDENT_AMBULATORY_CARE_PROVIDER_SITE_OTHER): Payer: Medicare Other | Admitting: Family Medicine

## 2021-10-26 VITALS — BP 138/72 | HR 59 | Temp 97.7°F | Resp 18 | Ht 64.0 in | Wt 153.0 lb

## 2021-10-26 DIAGNOSIS — H60391 Other infective otitis externa, right ear: Secondary | ICD-10-CM | POA: Diagnosis not present

## 2021-10-26 MED ORDER — NEOMYCIN-POLYMYXIN-HC 3.5-10000-1 OT SOLN: 3.0000 [drp] | Freq: Four times a day (QID) | OTIC | 0 refills | Status: DC

## 2021-10-26 NOTE — Progress Notes (Signed)
Subjective:    Patient ID: Erin Wong, female    DOB: January 31, 1951, 71 y.o.   MRN: [id number] Patient reports several days of pain in her right ear.  Her ear feels stopped up.  Occasionally will crack and pop.  On examination today she has pain when I pull on the tragus.  The auditory canal itself is erythematous and irritated.  The tympanic membrane is retracted.  The auditory canal is swollen. She denies any fevers or chills. Past Medical History:  Diagnosis Date   Allergy    Arthritis    Asthma    Atrial fibrillation (HCC)    Benign essential HTN    CHF (congestive heart failure) (HCC)    CKD (chronic kidney disease) stage 3, GFR 30-59 ml/min (HCC)    Dysrhythmia     Afib -  had Ablation   GERD (gastroesophageal reflux disease)    HLD (hyperlipidemia)    HTN (hypertension)    Pneumonia    Past Surgical History:  Procedure Laterality Date   ABDOMINAL HYSTERECTOMY  10/04/1995   Hysterectomy and Bilateral oophorectomy   CARDIOVERSION N/A 01/17/2020   Procedure: CARDIOVERSION;  Surgeon: [name], MD;  Location: [place];  Service: Cardiovascular;  Laterality: N/A;   COLON SURGERY  09/2009   hole in colon   FRACTURE SURGERY Right 08/02/2011   Right Leg--Plates & Screws   HAND SURGERY Right 09/02/2006   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--Arthritis   HARDWARE REMOVAL Right 12/04/2020   Procedure: rigth knee hardware removal;  Surgeon: Leandrew Koyanagi, MD;  Location: [place];  Service: Orthopedics;  Laterality: Right;   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   at time of colon surgery   TEE WITHOUT CARDIOVERSION N/A 01/17/2020   Procedure: TRANSESOPHAGEAL ECHOCARDIOGRAM (TEE);  Surgeon: [name], MD;  Location: [facility] ENDOSCOPY;  Service: Cardiovascular;  Laterality: N/A;   TOTAL KNEE ARTHROPLASTY Right 02/19/2021   Procedure: RIGHT TOTAL KNEE ARTHROPLASTY;  Surgeon: Leandrew Koyanagi, MD;  Location: [place];  Service: Orthopedics;  Laterality: Right;   Current Outpatient Medications on File  Prior to Visit  Medication Sig Dispense Refill   albuterol (VENTOLIN HFA) 108 (90 Base) MCG/ACT inhaler Inhale 1 puff into the lungs every 6 (six) hours as needed for wheezing or shortness of breath. 18 g 3   amiodarone (PACERONE) 200 MG tablet Take 1/2 tablet daily (Patient taking differently: Take 100 mg by mouth daily. Take 1/2 tablet daily) 90 tablet 3   atorvastatin (LIPITOR) 40 MG tablet Take 1 tablet (40 mg total) by mouth daily. 90 tablet 1   benazepril (LOTENSIN) 20 MG tablet Take 1 tablet (20 mg total) by mouth daily. 90 tablet 1   Biotin 5000 MCG TABS Take 5,000 mcg by mouth daily.     CALCIUM GLUCONATE PO Take 600 mg by mouth daily.     carvedilol (COREG) 3.125 MG tablet Take 1 tablet (3.125 mg total) by mouth 2 (two) times daily. 180 tablet 3   Cholecalciferol 25 MCG (1000 UT) tablet Take 1 tablet (1,000 Units total) by mouth daily. 90 tablet 1   docusate sodium (COLACE) 100 MG capsule Take 1 capsule (100 mg total) by mouth daily as needed. 30 capsule 2   ELIQUIS 5 MG TABS tablet TAKE 1 TABLET BY MOUTH TWICE A DAY 60 tablet 11   fluticasone (FLONASE) 50 MCG/ACT nasal spray Place 2 sprays into both nostrils daily as needed for allergies. 16 g 3   fluticasone-salmeterol (ADVAIR) 100-50 MCG/ACT AEPB  INHALE 1 PUFF INTO THE LUNGS TWICE A DAY 180 each 1   furosemide (LASIX) 40 MG tablet TAKE 1 TABLET BY MOUTH EVERY DAY 90 tablet 3   methocarbamol (ROBAXIN) 500 MG tablet Take 500 mg by mouth 2 (two) times daily as needed.     omeprazole (PRILOSEC) 20 MG capsule TAKE 1 CAPSULE BY MOUTH EVERY DAY 90 capsule 3   ondansetron (ZOFRAN) 4 MG tablet Take 1 tablet (4 mg total) by mouth every 8 (eight) hours as needed for nausea or vomiting. 40 tablet 0   polyethylene glycol (MIRALAX / GLYCOLAX) 17 g packet Take 17 g by mouth daily.     traMADol (ULTRAM) 50 MG tablet TAKE 2 TABLETS BY MOUTH 3 TIMES A DAY AS NEEDED FOR PAIN 180 tablet 3   No current facility-administered medications on file prior to  visit.   No Known Allergies Social History   Socioeconomic History   Marital status: Married    Spouse name: Not on file   Number of children: 3   Years of education: Not on file   Highest education level: Not on file  Occupational History   Occupation: retired  Tobacco Use   Smoking status: Former    Types: Cigarettes    Quit date: 01/10/1991    Years since quitting: 30.8   Smokeless tobacco: Never  Vaping Use   Vaping Use: Never used  Substance and Sexual Activity   Alcohol use: Not Currently    Comment: quit in May/2021   Drug use: No   Sexual activity: Never  Other Topics Concern   Not on file  Social History Narrative   Not on file   Social Determinants of Health   Financial Resource Strain: Not on file  Food Insecurity: Not on file  Transportation Needs: Not on file  Physical Activity: Not on file  Stress: Not on file  Social Connections: Not on file  Intimate Partner Violence: Not on file     Review of Systems  All other systems reviewed and are negative.     Objective:   Physical Exam Vitals reviewed.  Constitutional:      Appearance: Normal appearance. She is well-developed. She is obese.  HENT:     Right Ear: Swelling and tenderness present. No middle ear effusion. Tympanic membrane is retracted.     Left Ear: Tympanic membrane and ear canal normal.     Mouth/Throat:     Pharynx: No oropharyngeal exudate.  Cardiovascular:     Rate and Rhythm: Normal rate. Rhythm irregular.     Heart sounds: Normal heart sounds. No murmur heard.   No friction rub. No gallop.  Pulmonary:     Effort: Pulmonary effort is normal. No respiratory distress.     Breath sounds: No wheezing, rhonchi or rales.  Neurological:     Mental Status: She is alert.          Assessment & Plan:  Other infective acute otitis externa of right ear Begin Cortisporin HC otic 3 drops every 6 hours for the next 5 to 7 days.  Recheck in 1 week if no better or sooner if worse

## 2021-11-01 DIAGNOSIS — L600 Ingrowing nail: Secondary | ICD-10-CM | POA: Diagnosis not present

## 2021-11-01 DIAGNOSIS — M79672 Pain in left foot: Secondary | ICD-10-CM | POA: Diagnosis not present

## 2021-11-01 DIAGNOSIS — M79671 Pain in right foot: Secondary | ICD-10-CM | POA: Diagnosis not present

## 2021-11-13 ENCOUNTER — Other Ambulatory Visit: Payer: Self-pay | Admitting: Family Medicine

## 2021-11-13 NOTE — Telephone Encounter (Signed)
LOV 10/26/21 ?Last refill 06/25/21, #180, 3 refills ? ?Please review, thanks! ? ?

## 2021-11-13 NOTE — Telephone Encounter (Signed)
Patient last seen on 09/29/20 for chronic pain of right knee.  ?

## 2021-11-15 ENCOUNTER — Other Ambulatory Visit: Payer: Medicare Other

## 2021-11-15 DIAGNOSIS — L600 Ingrowing nail: Secondary | ICD-10-CM | POA: Diagnosis not present

## 2021-11-29 DIAGNOSIS — T8189XA Other complications of procedures, not elsewhere classified, initial encounter: Secondary | ICD-10-CM | POA: Diagnosis not present

## 2021-12-10 DIAGNOSIS — H905 Unspecified sensorineural hearing loss: Secondary | ICD-10-CM | POA: Diagnosis not present

## 2022-01-06 ENCOUNTER — Other Ambulatory Visit: Payer: Self-pay | Admitting: Cardiovascular Disease

## 2022-01-07 ENCOUNTER — Other Ambulatory Visit: Payer: Self-pay

## 2022-01-07 ENCOUNTER — Telehealth: Payer: Self-pay | Admitting: Family Medicine

## 2022-01-07 NOTE — Telephone Encounter (Signed)
Received fax from [facility] on [address]. Requesting refill on wixela 100-50 INHUB, Qty 180 ?SIG: inhale 1 puff into the lungs twice a day. ?

## 2022-01-07 NOTE — Telephone Encounter (Signed)
Med is not on pt's current med profile, will need PCPs order. Please review. Thank  you! ?

## 2022-01-08 ENCOUNTER — Ambulatory Visit (INDEPENDENT_AMBULATORY_CARE_PROVIDER_SITE_OTHER): Payer: Medicare Other | Admitting: Family Medicine

## 2022-01-08 ENCOUNTER — Encounter: Payer: Self-pay | Admitting: Family Medicine

## 2022-01-08 ENCOUNTER — Other Ambulatory Visit: Payer: Self-pay

## 2022-01-08 VITALS — BP 120/68 | HR 62 | Temp 98.4°F | Ht 64.0 in | Wt 154.2 lb

## 2022-01-08 DIAGNOSIS — I4891 Unspecified atrial fibrillation: Secondary | ICD-10-CM

## 2022-01-08 DIAGNOSIS — Z9049 Acquired absence of other specified parts of digestive tract: Secondary | ICD-10-CM | POA: Diagnosis not present

## 2022-01-08 DIAGNOSIS — I5022 Chronic systolic (congestive) heart failure: Secondary | ICD-10-CM | POA: Diagnosis not present

## 2022-01-08 DIAGNOSIS — N183 Chronic kidney disease, stage 3 unspecified: Secondary | ICD-10-CM

## 2022-01-08 DIAGNOSIS — Z0000 Encounter for general adult medical examination without abnormal findings: Secondary | ICD-10-CM

## 2022-01-08 DIAGNOSIS — Z1231 Encounter for screening mammogram for malignant neoplasm of breast: Secondary | ICD-10-CM | POA: Diagnosis not present

## 2022-01-08 MED ORDER — FLUTICASONE-SALMETEROL 100-50 MCG/ACT IN AEPB: 1.0000 | INHALATION_SPRAY | Freq: Two times a day (BID) | RESPIRATORY_TRACT | 1 refills | Status: DC

## 2022-01-08 NOTE — Progress Notes (Signed)
Error. See 01/07/22 ?

## 2022-01-08 NOTE — Progress Notes (Signed)
Subjective:    Patient ID: Erin Wong, female    DOB: 1951-05-03, 71 y.o.   MRN: [id number] Patient is a very pleasant 71 year old Caucasian female here today for complete physical exam.  Past medical history significant for congestive heart failure, atrial fibrillation, hypertension, chronic kidney disease.  However she is in normal sinus rhythm today.  She has a history of an ablation.  She denies any symptoms of congestive heart failure.  She denies any shortness of breath or chest pain.  She had a colonoscopy last year and is not due again for 3 years.  She is due for mammogram.  Due to her age she does not require Pap smear.  She is due for the shingles vaccine and she is also due for COVID booster.  We discussed both of these and she defers them at the present.  She denies any depression or memory loss or falls. Past Medical History:  Diagnosis Date   Allergy    Arthritis    Asthma    Atrial fibrillation (HCC)    Benign essential HTN    CHF (congestive heart failure) (HCC)    CKD (chronic kidney disease) stage 3, GFR 30-59 ml/min (HCC)    Dysrhythmia     Afib -  had Ablation   GERD (gastroesophageal reflux disease)    HLD (hyperlipidemia)    HTN (hypertension)    Pneumonia    Past Surgical History:  Procedure Laterality Date   ABDOMINAL HYSTERECTOMY  10/04/1995   Hysterectomy and Bilateral oophorectomy   CARDIOVERSION N/A 01/17/2020   Procedure: CARDIOVERSION;  Surgeon: Wendall Stade, MD;  Location: [facility] ENDOSCOPY;  Service: Cardiovascular;  Laterality: N/A;   COLON SURGERY  09/2009   hole in colon   FRACTURE SURGERY Right 08/02/2011   Right Leg--Plates & Screws   HAND SURGERY Right 09/02/2006   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--Arthritis   HARDWARE REMOVAL Right 12/04/2020   Procedure: rigth knee hardware removal;  Surgeon: Tarry Kos, MD;  Location: [facility] OR;  Service: Orthopedics;  Laterality: Right;   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   at time of colon surgery   TEE WITHOUT  CARDIOVERSION N/A 01/17/2020   Procedure: TRANSESOPHAGEAL ECHOCARDIOGRAM (TEE);  Surgeon: Wendall Stade, MD;  Location: [facility] ENDOSCOPY;  Service: Cardiovascular;  Laterality: N/A;   TOTAL KNEE ARTHROPLASTY Right 02/19/2021   Procedure: RIGHT TOTAL KNEE ARTHROPLASTY;  Surgeon: Tarry Kos, MD;  Location: [facility] OR;  Service: Orthopedics;  Laterality: Right;   Current Outpatient Medications on File Prior to Visit  Medication Sig Dispense Refill   albuterol (VENTOLIN HFA) 108 (90 Base) MCG/ACT inhaler Inhale 1 puff into the lungs every 6 (six) hours as needed for wheezing or shortness of breath. 18 g 3   amiodarone (PACERONE) 200 MG tablet Take 1/2 tablet daily (Patient taking differently: Take 100 mg by mouth daily. Take 1/2 tablet daily) 90 tablet 3   atorvastatin (LIPITOR) 40 MG tablet Take 1 tablet (40 mg total) by mouth daily. 90 tablet 1   benazepril (LOTENSIN) 20 MG tablet Take 1 tablet (20 mg total) by mouth daily. 90 tablet 1   Biotin 5000 MCG TABS Take 5,000 mcg by mouth daily.     CALCIUM GLUCONATE PO Take 600 mg by mouth daily.     carvedilol (COREG) 3.125 MG tablet Take 1 tablet (3.125 mg total) by mouth 2 (two) times daily. 180 tablet 3   Cholecalciferol 25 MCG (1000 UT) tablet Take 1 tablet (1,000 Units total) by  mouth daily. 90 tablet 1   docusate sodium (COLACE) 100 MG capsule Take 1 capsule (100 mg total) by mouth daily as needed. 30 capsule 2   ELIQUIS 5 MG TABS tablet TAKE 1 TABLET BY MOUTH TWICE A DAY 60 tablet 11   fluticasone (FLONASE) 50 MCG/ACT nasal spray Place 2 sprays into both nostrils daily as needed for allergies. 16 g 3   furosemide (LASIX) 40 MG tablet TAKE 1 TABLET BY MOUTH EVERY DAY 90 tablet 3   methocarbamol (ROBAXIN) 500 MG tablet Take 500 mg by mouth 2 (two) times daily as needed.     neomycin-polymyxin-hydrocortisone (CORTISPORIN) OTIC solution Place 3 drops into the right ear 4 (four) times daily. 10 mL 0   omeprazole (PRILOSEC) 20 MG capsule TAKE 1 CAPSULE BY  MOUTH EVERY DAY 90 capsule 3   ondansetron (ZOFRAN) 4 MG tablet Take 1 tablet (4 mg total) by mouth every 8 (eight) hours as needed for nausea or vomiting. 40 tablet 0   polyethylene glycol (MIRALAX / GLYCOLAX) 17 g packet Take 17 g by mouth daily.     traMADol (ULTRAM) 50 MG tablet TAKE 2 TABLETS BY MOUTH 3 TIMES A DAY AS NEEDED FOR PAIN 180 tablet 3   [DISCONTINUED] fluticasone-salmeterol (ADVAIR) 100-50 MCG/ACT AEPB INHALE 1 PUFF INTO THE LUNGS TWICE A DAY 180 each 1   No current facility-administered medications on file prior to visit.   No Known Allergies Social History   Socioeconomic History   Marital status: Married    Spouse name: Not on file   Number of children: 3   Years of education: Not on file   Highest education level: Not on file  Occupational History   Occupation: retired  Tobacco Use   Smoking status: Former    Types: Cigarettes    Quit date: 01/10/1991    Years since quitting: 31.0   Smokeless tobacco: Never  Vaping Use   Vaping Use: Never used  Substance and Sexual Activity   Alcohol use: Not Currently    Comment: quit in May/2021   Drug use: No   Sexual activity: Never  Other Topics Concern   Not on file  Social History Narrative   Not on file   Social Determinants of Health   Financial Resource Strain: Not on file  Food Insecurity: Not on file  Transportation Needs: Not on file  Physical Activity: Not on file  Stress: Not on file  Social Connections: Not on file  Intimate Partner Violence: Not on file     Review of Systems  All other systems reviewed and are negative.     Objective:   Physical Exam Vitals reviewed.  Constitutional:      General: She is not in acute distress.    Appearance: Normal appearance. She is well-developed and normal weight. She is not ill-appearing, toxic-appearing or diaphoretic.  HENT:     Head: Normocephalic and atraumatic.     Right Ear: Tympanic membrane and ear canal normal.     Left Ear: Tympanic  membrane and ear canal normal.     Nose: Nose normal. No congestion or rhinorrhea.     Mouth/Throat:     Pharynx: Oropharynx is clear. No oropharyngeal exudate or posterior oropharyngeal erythema.  Eyes:     General: No scleral icterus.       Right eye: No discharge.        Left eye: No discharge.     Extraocular Movements: Extraocular movements intact.  Conjunctiva/sclera: Conjunctivae normal.     Pupils: Pupils are equal, round, and reactive to light.  Neck:     Vascular: No carotid bruit.  Cardiovascular:     Rate and Rhythm: Normal rate and regular rhythm.     Heart sounds: Normal heart sounds. No murmur heard.   No friction rub. No gallop.  Pulmonary:     Effort: Pulmonary effort is normal. No respiratory distress.     Breath sounds: Normal breath sounds. No stridor. No wheezing or rales.  Abdominal:     General: Bowel sounds are normal. There is no distension.     Palpations: Abdomen is soft. There is no mass.     Tenderness: There is no abdominal tenderness. There is no guarding or rebound.  Musculoskeletal:        General: Deformity present.     Cervical back: No rigidity.     Right knee: Swelling present. Decreased range of motion. Tenderness present over the medial joint line and lateral joint line.     Right lower leg: No edema.     Left lower leg: No edema.  Lymphadenopathy:     Cervical: No cervical adenopathy.  Skin:    Findings: No lesion or rash.  Neurological:     General: No focal deficit present.     Mental Status: She is alert and oriented to person, place, and time. Mental status is at baseline.     Cranial Nerves: No cranial nerve deficit.     Sensory: No sensory deficit.     Motor: No weakness.     Coordination: Coordination normal.     Gait: Gait normal.  Psychiatric:        Mood and Affect: Mood normal.        Behavior: Behavior normal.        Thought Content: Thought content normal.        Judgment: Judgment normal.          Assessment  & Plan:  General medical exam - Plan: CBC with Differential/Platelet, Lipid panel, COMPLETE METABOLIC PANEL WITH GFR, TSH  Chronic systolic congestive heart failure (HCC) - Plan: CBC with Differential/Platelet, Lipid panel, COMPLETE METABOLIC PANEL WITH GFR, TSH  History of colectomy  Atrial fibrillation with rapid ventricular response (HCC) - Plan: CBC with Differential/Platelet, Lipid panel, COMPLETE METABOLIC PANEL WITH GFR, TSH  Stage 3 chronic kidney disease, unspecified whether stage 3a or 3b CKD (HCC) - Plan: CBC with Differential/Platelet, Lipid panel, COMPLETE METABOLIC PANEL WITH GFR, TSH  Encounter for screening mammogram for malignant neoplasm of breast - Plan: MM 3D SCREEN BREAST BILATERAL Overall, the patient is doing very well.  Her blood pressure today is outstanding at 120/68.  I will check a CBC, CMP, and a fasting lipid panel.  Given her history of atrial fibrillation I will also check a TSH.  Today she appears euvolemic with no evidence of fluid overload.  She is in normal sinus rhythm.  I would like her LDL cholesterol to be below 100.  I will monitor her chronic kidney disease by checking a CMP.  I will monitor for any anemia of chronic disease by checking a CBC.  I will schedule the patient for mammogram.  Her colonoscopy is up-to-date and she does not require Pap smear.  Recommended the shingles vaccine.  Also recommended a COVID booster which the patient politely declines

## 2022-01-09 LAB — TSH: TSH: 1.87 mIU/L (ref 0.40–4.50)

## 2022-01-09 LAB — COMPLETE METABOLIC PANEL WITH GFR
AG Ratio: 1.6 (calc) (ref 1.0–2.5)
ALT: 17 U/L (ref 6–29)
AST: 16 U/L (ref 10–35)
Albumin: 4.1 g/dL (ref 3.6–5.1)
Alkaline phosphatase (APISO): 88 U/L (ref 37–153)
BUN: 9 mg/dL (ref 7–25)
CO2: 26 mmol/L (ref 20–32)
Calcium: 9.2 mg/dL (ref 8.6–10.4)
Chloride: 94 mmol/L — ABNORMAL LOW (ref 98–110)
Creat: 0.96 mg/dL (ref 0.60–1.00)
Globulin: 2.6 g/dL (calc) (ref 1.9–3.7)
Glucose, Bld: 88 mg/dL (ref 65–99)
Potassium: 4.7 mmol/L (ref 3.5–5.3)
Sodium: 130 mmol/L — ABNORMAL LOW (ref 135–146)
Total Bilirubin: 0.5 mg/dL (ref 0.2–1.2)
Total Protein: 6.7 g/dL (ref 6.1–8.1)
eGFR: 63 mL/min/{1.73_m2} (ref >=60)

## 2022-01-09 LAB — CBC WITH DIFFERENTIAL/PLATELET
Absolute Monocytes: 1316 cells/uL — ABNORMAL HIGH (ref 200–950)
Basophils Absolute: 64 cells/uL (ref 0–200)
Basophils Relative: 0.7 %
Eosinophils Absolute: 248 cells/uL (ref 15–500)
Eosinophils Relative: 2.7 %
HCT: 36.4 % (ref 35.0–45.0)
Hemoglobin: 12.5 g/dL (ref 11.7–15.5)
Lymphs Abs: 2916 cells/uL (ref 850–3900)
MCH: 31.7 pg (ref 27.0–33.0)
MCHC: 34.3 g/dL (ref 32.0–36.0)
MCV: 92.4 fL (ref 80.0–100.0)
MPV: 10.2 fL (ref 7.5–12.5)
Monocytes Relative: 14.3 %
Neutro Abs: 4655 cells/uL (ref 1500–7800)
Neutrophils Relative %: 50.6 %
Platelets: 433 10*3/uL — ABNORMAL HIGH (ref 140–400)
RBC: 3.94 10*6/uL (ref 3.80–5.10)
RDW: 12.3 % (ref 11.0–15.0)
Total Lymphocyte: 31.7 %
WBC: 9.2 10*3/uL (ref 3.8–10.8)

## 2022-01-09 LAB — LIPID PANEL
Cholesterol: 172 mg/dL (ref <200)
HDL: 85 mg/dL (ref >=50)
LDL Cholesterol (Calc): 71 mg/dL (calc)
Non-HDL Cholesterol (Calc): 87 mg/dL (calc) (ref <130)
Total CHOL/HDL Ratio: 2 (calc) (ref <5.0)
Triglycerides: 80 mg/dL (ref <150)

## 2022-01-18 ENCOUNTER — Ambulatory Visit
Admission: RE | Admit: 2022-01-18 | Discharge: 2022-01-18 | Disposition: A | Discharge status: Home / Self Care | Payer: Medicare Other | Source: Ambulatory Visit | Attending: Family Medicine | Admitting: Family Medicine

## 2022-01-18 DIAGNOSIS — Z1231 Encounter for screening mammogram for malignant neoplasm of breast: Secondary | ICD-10-CM | POA: Diagnosis not present

## 2022-01-31 ENCOUNTER — Other Ambulatory Visit: Payer: Self-pay | Admitting: Family Medicine

## 2022-01-31 ENCOUNTER — Other Ambulatory Visit: Payer: Self-pay | Admitting: Cardiovascular Disease

## 2022-01-31 NOTE — Telephone Encounter (Signed)
Requested Prescriptions  Pending Prescriptions Disp Refills  . benazepril (LOTENSIN) 20 MG tablet [Pharmacy Med Name: BENAZEPRIL HCL 20 MG TABLET] 90 tablet 1    Sig: TAKE 1 TABLET BY MOUTH EVERY DAY     Cardiovascular:  ACE Inhibitors Passed - 01/31/2022  2:23 AM      Passed - Cr in normal range and within 180 days    Creat  Date Value Ref Range Status  01/08/2022 0.96 0.60 - 1.00 mg/dL Final         Passed - K in normal range and within 180 days    Potassium  Date Value Ref Range Status  01/08/2022 4.7 3.5 - 5.3 mmol/L Final         Passed - Patient is not pregnant      Passed - Last BP in normal range    BP Readings from Last 1 Encounters:  01/08/22 120/68         Passed - Valid encounter within last 6 months    Recent Outpatient Visits          3 weeks ago General medical exam   [place] [name], MD   3 months ago Other infective acute otitis externa of right ear   [place] [name], MD   7 months ago Acute bacterial rhinosinusitis   [place] Dennard Schaumann, Cammie Mcgee, MD   1 year ago Encounter for annual wellness exam in Medicare patient   [place] Eulogio Bear, NP   1 year ago Chronic pain of right knee   [place] Pickard, Cammie Mcgee, MD      Future Appointments            In 2 weeks Leandrew Koyanagi, MD [facility]

## 2022-02-18 ENCOUNTER — Other Ambulatory Visit: Payer: Self-pay | Admitting: Cardiovascular Disease

## 2022-02-19 ENCOUNTER — Ambulatory Visit (INDEPENDENT_AMBULATORY_CARE_PROVIDER_SITE_OTHER): Payer: Medicare Other | Admitting: Orthopaedic Surgery

## 2022-02-19 ENCOUNTER — Ambulatory Visit (INDEPENDENT_AMBULATORY_CARE_PROVIDER_SITE_OTHER): Payer: Medicare Other

## 2022-02-19 DIAGNOSIS — Z96651 Presence of right artificial knee joint: Secondary | ICD-10-CM | POA: Diagnosis not present

## 2022-02-19 NOTE — Progress Notes (Signed)
Office Visit Note   Patient: Erin Wong           Date of Birth: May 23, 1951           MRN: [id number] Visit Date: 02/19/2022              Requested by: [name], MD [address] [place],   [id number] PCP: [name], MD   Assessment & Plan: Visit Diagnoses:  1. Status post total right knee replacement     Plan: Erin Wong is 1 year status post right total knee replacement for severe valgus posttraumatic DJD.  She is doing really well has no complaints.  She is so excited that she is able to walk without pain anymore.  Examination right knee shows a fully healed surgical scar with excellent functional range of motion.  Stable to varus valgus.  Essentially normal gait and ambulation.  X-rays demonstrate stable implants without any complications.  Erin Wong is doing really well from her surgery.  She is 1 year out at this time.  Dental prophylaxis reinforced.  Recheck in another year with two-view x-rays of the right knee.  Follow-Up Instructions: Return in about 1 year (around 02/20/2023).   Orders:  Orders Placed This Encounter  Procedures  . XR Knee 1-2 Views Right   No orders of the defined types were placed in this encounter.     Procedures: No procedures performed   Clinical Data: No additional findings.   Subjective: Chief Complaint  Patient presents with  . Right Knee - Follow-up    HPI  Review of Systems   Objective: Vital Signs: There were no vitals taken for this visit.  Physical Exam  Ortho Exam  Specialty Comments:  No specialty comments available.  Imaging: XR Knee 1-2 Views Right  Result Date: 02/19/2022 Stable total knee replacement in good alignment     PMFS History: Patient Active Problem List   Diagnosis Date Noted  . Status post total right knee replacement 02/19/2021  . Post-traumatic osteoarthritis of right knee 12/04/2020  . Positive colorectal cancer screening using Cologuard test 07/02/2020  .  History of colectomy 07/02/2020  . Colon cancer screening 07/02/2020  . CHF (congestive heart failure) ([place])   . Acute respiratory failure with hypoxia ([place]) 02/12/2020  . Acute bronchitis 02/12/2020  . Acute systolic heart failure ([place]) 01/19/2020  . Atrial fibrillation with rapid ventricular response ([place]) 01/13/2020  . CKD (chronic kidney disease) stage 3, GFR 30-59 ml/min (HCC)   . Benign essential HTN   . HLD (hyperlipidemia)   . Hyperlipidemia 02/12/2017  . AKI (acute kidney injury) ([place])   . Chest pain 01/13/2017  . Chest pressure   . Acute kidney injury superimposed on CKD ([place])   . Osteoporosis 10/18/2015  . Vitamin D deficiency 07/20/2015  . HTN (hypertension) 07/17/2015  . GERD (gastroesophageal reflux disease) 01/12/2015  . Chronic constipation 01/12/2015  . History of colon surgery 01/12/2015  . History of total hysterectomy with bilateral salpingo-oophorectomy (BSO) 01/12/2015  . Allergy   . Asthma    Past Medical History:  Diagnosis Date  . Allergy   . Arthritis   . Asthma   . Atrial fibrillation ([place])   . Benign essential HTN   . CHF (congestive heart failure) ([place])   . CKD (chronic kidney disease) stage 3, GFR 30-59 ml/min (HCC)   . Dysrhythmia     Afib -  had Ablation  . GERD (gastroesophageal reflux disease)   .  HLD (hyperlipidemia)   . HTN (hypertension)   . Pneumonia     Family History  Problem Relation Age of Onset  . Arthritis Mother   . Miscarriages / [place] Mother   . Lung cancer Mother        Had quit smoking for 25 years  . Esophageal cancer Mother   . Alcohol abuse Father   . Alcohol abuse Brother   . Kidney cancer Brother   . Breast cancer Sister   . COPD Brother   . Asthma Maternal Aunt   . Diabetes Maternal Aunt   . Colon cancer Neg Hx   . Inflammatory bowel disease Neg Hx   . Liver disease Neg Hx   . Pancreatic cancer Neg Hx   . Rectal cancer Neg Hx   . Stomach cancer Neg Hx     Past Surgical History:  Procedure  Laterality Date  . ABDOMINAL HYSTERECTOMY  10/04/1995   Hysterectomy and Bilateral oophorectomy  . CARDIOVERSION N/A 01/17/2020   Procedure: CARDIOVERSION;  Surgeon: [name], MD;  Location: [facility] ENDOSCOPY;  Service: Cardiovascular;  Laterality: N/A;  . COLON SURGERY  09/2009   hole in colon  . FRACTURE SURGERY Right 08/02/2011   Right Leg--Plates & Screws  . 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 HARDWARE REMOVAL Right 12/04/2020   Procedure: rigth knee hardware removal;  Surgeon: Leandrew Koyanagi, MD;  Location: [place];  Service: Orthopedics;  Laterality: Right;  . 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 TEE WITHOUT CARDIOVERSION N/A 01/17/2020   Procedure: TRANSESOPHAGEAL ECHOCARDIOGRAM (TEE);  Surgeon: [name], MD;  Location: [facility] ENDOSCOPY;  Service: Cardiovascular;  Laterality: N/A;  . TOTAL KNEE ARTHROPLASTY Right 02/19/2021   Procedure: RIGHT TOTAL KNEE ARTHROPLASTY;  Surgeon: Leandrew Koyanagi, MD;  Location: [facility];  Service: Orthopedics;  Laterality: Right;   Social History   Occupational History  . Occupation: retired  Tobacco Use  . Smoking status: Former    Types: Cigarettes    Quit date: 01/10/1991    Years since quitting: 31.1  . Smokeless tobacco: Never  Vaping Use  . Vaping Use: Never used  Substance and Sexual Activity  . Alcohol use: Not Currently    Comment: quit in May/2021  . Drug use: No  . Sexual activity: Never

## 2022-02-25 ENCOUNTER — Other Ambulatory Visit: Payer: Self-pay | Admitting: Cardiovascular Disease

## 2022-02-25 ENCOUNTER — Other Ambulatory Visit: Payer: Self-pay | Admitting: Family Medicine

## 2022-02-28 ENCOUNTER — Other Ambulatory Visit: Payer: Self-pay | Admitting: Family Medicine

## 2022-02-28 NOTE — Telephone Encounter (Signed)
Requested Prescriptions  Pending Prescriptions Disp Refills  . atorvastatin (LIPITOR) 40 MG tablet [Pharmacy Med Name: ATORVASTATIN 40 MG TABLET] 90 tablet 0    Sig: TAKE 1 TABLET BY MOUTH EVERY DAY     Cardiovascular:  Antilipid - Statins Failed - 02/28/2022  2:06 AM      Failed - Lipid Panel in normal range within the last 12 months    Cholesterol, Total  Date Value Ref Range Status  09/06/2020 186 100 - 199 mg/dL Final   Cholesterol  Date Value Ref Range Status  01/08/2022 172 <200 mg/dL Final   LDL Cholesterol (Calc)  Date Value Ref Range Status  01/08/2022 71 mg/dL (calc) Final    Comment:    Reference range: <100 . Desirable range <100 mg/dL for primary prevention;   <70 mg/dL for patients with CHD or diabetic patients  with > or = 2 CHD risk factors. [place] Kitchen LDL-C is now calculated using the Martin-Hopkins  calculation, which is a validated novel method providing  better accuracy than the Friedewald equation in the  estimation of LDL-C.  Cresenciano Genre et al. Annamaria Helling. 6962;952(84): 2061-2068  (http://education.[employer].com/faq/FAQ164)    HDL  Date Value Ref Range Status  01/08/2022 85 > OR = 50 mg/dL Final  09/06/2020 89 >39 mg/dL Final   Triglycerides  Date Value Ref Range Status  01/08/2022 80 <150 mg/dL Final         Passed - Patient is not pregnant      Passed - Valid encounter within last 12 months    Recent Outpatient Visits          1 month ago General medical exam   [place] [name], MD   4 months ago Other infective acute otitis externa of right ear   [place] [name], MD   8 months ago Acute bacterial rhinosinusitis   [place] Dennard Schaumann, Cammie Mcgee, MD   1 year ago Encounter for annual wellness exam in Medicare patient   [place] Eulogio Bear, NP   1 year ago Chronic pain of right knee   [place] Pickard, Cammie Mcgee, MD       Future Appointments            In 11 months Leandrew Koyanagi, MD [facility]

## 2022-03-14 ENCOUNTER — Ambulatory Visit (INDEPENDENT_AMBULATORY_CARE_PROVIDER_SITE_OTHER): Payer: Medicare Other | Admitting: Family Medicine

## 2022-03-14 VITALS — BP 120/68 | HR 94 | Temp 98.3°F | Ht 64.0 in | Wt 150.0 lb

## 2022-03-14 DIAGNOSIS — H60331 Swimmer's ear, right ear: Secondary | ICD-10-CM

## 2022-03-14 DIAGNOSIS — B341 Enterovirus infection, unspecified: Secondary | ICD-10-CM | POA: Diagnosis not present

## 2022-03-14 MED ORDER — NEOMYCIN-POLYMYXIN-HC 3.5-10000-1 OT SOLN: 3.0000 [drp] | Freq: Four times a day (QID) | OTIC | 0 refills | Status: DC

## 2022-03-14 NOTE — Progress Notes (Signed)
Subjective:    Patient ID: Erin Wong, female    DOB: 09-Jun-1951, 71 y.o.   MRN: [id number]  Patient reports drainage coming from her right ear.  This morning there was a small amount of blood on her finger.  The ear feels full and itches.  On examination she does have white exudate small amount in the back of the auditory canal.  The tympanic membrane itself does not appear erythematous or infected.  However the canal is tender and swollen.  She does have a small abrasion which she may have done using her fingertip or Q-tip.  This is the likely source of the bleeding.  However she also reports fevers and chills.  She reports feeling tired and sick.  She had a sore throat and a swollen lymph node on the right side of her neck.  This been going on for few days.  The lymph node now has essentially resolved.  However the roof of her mouth are too small yellow ulcers that appear to be like canker sores.  I suspect that she may have had coxsackievirus Past Medical History:  Diagnosis Date   Allergy    Arthritis    Asthma    Atrial fibrillation ([place])    Benign essential HTN    CHF (congestive heart failure) (HCC)    CKD (chronic kidney disease) stage 3, GFR 30-59 ml/min (HCC)    Dysrhythmia     Afib -  had Ablation   GERD (gastroesophageal reflux disease)    HLD (hyperlipidemia)    HTN (hypertension)    Pneumonia    Past Surgical History:  Procedure Laterality Date   ABDOMINAL HYSTERECTOMY  10/04/1995   Hysterectomy and Bilateral oophorectomy   CARDIOVERSION N/A 01/17/2020   Procedure: CARDIOVERSION;  Surgeon: [name], MD;  Location: [place];  Service: Cardiovascular;  Laterality: N/A;   COLON SURGERY  09/2009   hole in colon   FRACTURE SURGERY Right 08/02/2011   Right Leg--Plates & Screws   HAND SURGERY Right 09/02/2006   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--Arthritis   HARDWARE REMOVAL Right 12/04/2020   Procedure: rigth knee hardware removal;  Surgeon: Leandrew Koyanagi, MD;   Location: [place];  Service: Orthopedics;  Laterality: Right;   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   at time of colon surgery   TEE WITHOUT CARDIOVERSION N/A 01/17/2020   Procedure: TRANSESOPHAGEAL ECHOCARDIOGRAM (TEE);  Surgeon: [name], MD;  [facility] END[facility]COPY;  Service: Cardiovascular;  Laterality: N/A;   TOTAL KNEE ARTHROPLASTY Right 02/19/2021   Procedure: RIGHT TOTAL KNEE ARTHROPLASTY;  Surgeon: Leandrew Koyanagi, MD;  Location: [place];  Service: Orthopedics;  Laterality: Right;   Current Outpatient Medications on File Prior to Visit  Medication Sig Dispense Refill   albuterol (VENTOLIN HFA) 108 (90 Base) MCG/ACT inhaler Inhale 1 puff into the lungs every 6 (six) hours as needed for wheezing or shortness of breath. 18 g 3   amiodarone (PACERONE) 200 MG tablet TAKE 1/2 TABLET BY MOUTH EVERY DAY. Please schedule overdue appointment for future refills. Thank you 15 tablet 0   atorvastatin (LIPITOR) 40 MG tablet TAKE 1 TABLET BY MOUTH EVERY DAY 90 tablet 0   benazepril (LOTENSIN) 20 MG tablet TAKE 1 TABLET BY MOUTH EVERY DAY 90 tablet 0   Biotin 5000 MCG TABS Take 5,000 mcg by mouth daily.     CALCIUM GLUCONATE PO Take 600 mg by mouth daily.     carvedilol (COREG) 3.125 MG tablet TAKE 1 TABLET  BY MOUTH 2 TIMES DAILY. 180 tablet 0   Cholecalciferol 25 MCG (1000 UT) tablet Take 1 tablet (1,000 Units total) by mouth daily. 90 tablet 1   ELIQUIS 5 MG TABS tablet TAKE 1 TABLET BY MOUTH TWICE A DAY 60 tablet 11   fluticasone (FLONASE) 50 MCG/ACT nasal spray Place 2 sprays into both nostrils daily as needed for allergies. 16 g 3   fluticasone-salmeterol (WIXELA INHUB) 100-50 MCG/ACT AEPB Inhale 1 puff into the lungs 2 (two) times daily. 180 each 1   furosemide (LASIX) 40 MG tablet TAKE 1 TABLET BY MOUTH EVERY DAY 90 tablet 0   methocarbamol (ROBAXIN) 500 MG tablet Take 500 mg by mouth 2 (two) times daily as needed.     omeprazole (PRILOSEC) 20 MG capsule TAKE 1 CAPSULE BY MOUTH EVERY DAY 90 capsule 3    ondansetron (ZOFRAN) 4 MG tablet Take 1 tablet (4 mg total) by mouth every 8 (eight) hours as needed for nausea or vomiting. 40 tablet 0   polyethylene glycol (MIRALAX / GLYCOLAX) 17 g packet Take 17 g by mouth daily.     traMADol (ULTRAM) 50 MG tablet TAKE 2 TABLETS BY MOUTH 3 TIMES A DAY AS NEEDED FOR PAIN 180 tablet 3   No current facility-administered medications on file prior to visit.   No Known Allergies Social History   Socioeconomic History   Marital status: Married    Spouse name: Not on file   Number of children: 3   Years of education: Not on file   Highest education level: Not on file  Occupational History   Occupation: retired  Tobacco Use   Smoking status: Former    Types: Cigarettes    Quit date: 01/10/1991    Years since quitting: 31.1   Smokeless tobacco: Never  Vaping Use   Vaping Use: Never used  Substance and Sexual Activity   Alcohol use: Not Currently    Comment: quit in May/2021   Drug use: No   Sexual activity: Never  Other Topics Concern   Not on file  Social History Narrative   Not on file   Social Determinants of Health   Financial Resource Strain: Not on file  Food Insecurity: Not on file  Transportation Needs: Not on file  Physical Activity: Not on file  Stress: Not on file  Social Connections: Not on file  Intimate Partner Violence: Not on file     Review of Systems  All other systems reviewed and are negative.      Objective:   Physical Exam Vitals reviewed.  Constitutional:      Appearance: Normal appearance. She is well-developed. She is obese.  HENT:     Right Ear: Swelling and tenderness present. No middle ear effusion. Tympanic membrane is retracted.     Left Ear: Tympanic membrane and ear canal normal.     Mouth/Throat:     Mouth: Mucous membranes are moist. Oral lesions present.     Dentition: No dental abscesses or gum lesions.     Pharynx: Posterior oropharyngeal erythema present. No oropharyngeal exudate.    Eyes:     Conjunctiva/sclera: Conjunctivae normal.     Pupils: Pupils are equal, round, and reactive to light.  Cardiovascular:     Rate and Rhythm: Normal rate. Rhythm irregular.     Heart sounds: Normal heart sounds. No murmur heard.    No friction rub. No gallop.  Pulmonary:     Effort: Pulmonary effort is normal. No respiratory distress.  Breath sounds: No wheezing, rhonchi or rales.  Musculoskeletal:     Cervical back: No tenderness.  Lymphadenopathy:     Cervical: No cervical adenopathy.  Neurological:     Mental Status: She is alert.           Assessment & Plan:  Coxsackie virus infection  Acute swimmer's ear of right side  Begin Cortisporin HC otic 3 drops every 6 hours for the next 5 to 7 days.  Recheck in 1 week if no better or sooner if worse.  Reassured the patient that I feel the knot on the right side of her neck is likely a reactive lymph node due to the presence of a viral infection most likely coxsackievirus causing herpangina.  She is already starting to feel better.  Recommended clinical monitoring over the next few days.  Recheck immediately if worsening

## 2022-03-25 ENCOUNTER — Other Ambulatory Visit: Payer: Self-pay | Admitting: Family Medicine

## 2022-03-27 ENCOUNTER — Other Ambulatory Visit: Payer: Self-pay | Admitting: Cardiovascular Disease

## 2022-03-28 ENCOUNTER — Telehealth: Payer: Self-pay | Admitting: *Deleted

## 2022-03-28 NOTE — Telephone Encounter (Signed)
(  Late entry for 05/22/21 TC)- 90 day Ortho bundle call and survey completed.

## 2022-03-29 ENCOUNTER — Telehealth: Payer: Self-pay | Admitting: *Deleted

## 2022-03-29 NOTE — Telephone Encounter (Signed)
Ortho bundle 1 year call completed. ?

## 2022-04-24 ENCOUNTER — Other Ambulatory Visit: Payer: Self-pay | Admitting: Cardiovascular Disease

## 2022-04-28 ENCOUNTER — Other Ambulatory Visit: Payer: Self-pay | Admitting: Family Medicine

## 2022-04-29 NOTE — Telephone Encounter (Signed)
Requested Prescriptions  Pending Prescriptions Disp Refills  . benazepril (LOTENSIN) 20 MG tablet [Pharmacy Med Name: BENAZEPRIL HCL 20 MG TABLET] 90 tablet 0    Sig: TAKE 1 TABLET BY MOUTH EVERY DAY     Cardiovascular:  ACE Inhibitors Passed - 04/28/2022  9:50 AM      Passed - Cr in normal range and within 180 days    Creat  Date Value Ref Range Status  01/08/2022 0.96 0.60 - 1.00 mg/dL Final         Passed - K in normal range and within 180 days    Potassium  Date Value Ref Range Status  01/08/2022 4.7 3.5 - 5.3 mmol/L Final         Passed - Patient is not pregnant      Passed - Last BP in normal range    BP Readings from Last 1 Encounters:  03/14/22 120/68         Passed - Valid encounter within last 6 months    Recent Outpatient Visits          3 months ago General medical exam   [place] [name], MD   6 months ago Other infective acute otitis externa of right ear   [place] [name], MD   10 months ago Acute bacterial rhinosinusitis   [place] Dennard Schaumann, Cammie Mcgee, MD   1 year ago Encounter for annual wellness exam in Medicare patient   [place] Eulogio Bear, NP   1 year ago Chronic pain of right knee   [place] Pickard, Cammie Mcgee, MD      Future Appointments            In 2 days Miguel Aschoff [place]. [facility]   In 9 months Erlinda Hong, Marylynn Pearson, MD [facility]

## 2022-05-01 ENCOUNTER — Ambulatory Visit: Payer: Medicare Other | Attending: Physician Assistant | Admitting: Physician Assistant

## 2022-05-01 ENCOUNTER — Encounter: Payer: Self-pay | Admitting: Physician Assistant

## 2022-05-01 VITALS — BP 114/60 | HR 60 | Ht 63.0 in | Wt 151.0 lb

## 2022-05-01 DIAGNOSIS — Z7901 Long term (current) use of anticoagulants: Secondary | ICD-10-CM | POA: Diagnosis not present

## 2022-05-01 DIAGNOSIS — I10 Essential (primary) hypertension: Secondary | ICD-10-CM | POA: Diagnosis not present

## 2022-05-01 DIAGNOSIS — I480 Paroxysmal atrial fibrillation: Secondary | ICD-10-CM

## 2022-05-01 DIAGNOSIS — E785 Hyperlipidemia, unspecified: Secondary | ICD-10-CM | POA: Diagnosis not present

## 2022-05-01 DIAGNOSIS — R001 Bradycardia, unspecified: Secondary | ICD-10-CM | POA: Diagnosis not present

## 2022-05-01 MED ORDER — CARVEDILOL 3.125 MG PO TABS: 3.1250 mg | ORAL_TABLET | Freq: Two times a day (BID) | ORAL | 3 refills | Status: DC

## 2022-05-01 MED ORDER — BENAZEPRIL HCL 20 MG PO TABS: 20.0000 mg | ORAL_TABLET | Freq: Every day | ORAL | 3 refills | Status: DC

## 2022-05-01 MED ORDER — FUROSEMIDE 40 MG PO TABS: 40.0000 mg | ORAL_TABLET | Freq: Every day | ORAL | 3 refills | Status: DC

## 2022-05-01 NOTE — Progress Notes (Signed)
Office Visit    Patient Name: Erin Wong Date of Encounter: 05/01/2022  PCP:  [name], MD   [place]  Cardiologist:  Jenkins Rouge, MD  Advanced Practice Provider:  No care team member to display Electrophysiologist:  None   HPI    Erin Wong is a 71 y.o. 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  Her history includes bradycardia when on both beta-blocker and amiodarone.  Doses have been cut back.  Holter monitor 04/26/2020 showed less than 1% PAF burden, TTE 05/24/2020 showed EF normalized in sinus rhythm and 60 to 65% LVEF with moderate LAE and mild MR.  She has been compliant with all her medications.  She was last seen in the office by Dr. NJohnsie Cancelshe was having some issues with her pain medication that she was on for her surgery.  She went to the ER for nausea and atypical chest pain and her heart rate was low.  Troponins negative x2.  Today, she tells me that she has not had any exertional chest pain or shortness of breath.  She does have here and there chest pains which have been occurring due to her coughing as of late.  No bleeding on her blood thinner.  She was started on a new inhaler which she does not like as good as the Advair.  She plans to talk to her PCP about this.  Today she is in normal sinus rhythm.  She has not had any dizziness or lightheadedness.  Doing well from a CV standpoint.  Reports no shortness of breath nor dyspnea on exertion. Reports no chest pain, pressure, or tightness. No edema, orthopnea, PND. Reports no palpitations.    Past Medical History    Past Medical History:  Diagnosis Date   Allergy    Arthritis    Asthma    Atrial fibrillation (HCC)    Benign essential HTN    CHF (congestive heart failure)  (HCC)    CKD (chronic kidney disease) stage 3, GFR 30-59 ml/min (HCC)    Dysrhythmia     Afib -  had Ablation   GERD (gastroesophageal reflux disease)    HLD (hyperlipidemia)    HTN (hypertension)    Pneumonia    Past Surgical History:  Procedure Laterality Date   ABDOMINAL HYSTERECTOMY  10/04/1995   Hysterectomy and Bilateral oophorectomy   CARDIOVERSION N/A 01/17/2020   Procedure: CARDIOVERSION;  Surgeon: NJosue Hector MD;  [place]  Service: Cardiovascular;  Laterality: N/A;   COLON SURGERY  09/2009   hole in colon   FRACTURE SURGERY Right 08/02/2011   Right Leg--Plates & Screws   HAND SURGERY Right 09/02/2006   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--Arthritis   HARDWARE REMOVAL Right 12/04/2020   Procedure: rigth knee hardware removal;  Surgeon: XLeandrew Koyanagi MD;  [place]  Service: Orthopedics;  Laterality: Right;   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   at time of colon surgery   TEE WITHOUT CARDIOVERSION N/A 01/17/2020   Procedure: TRANSESOPHAGEAL ECHOCARDIOGRAM (TEE);  Surgeon: NJosue Hector MD;  [facility];  Service: Cardiovascular;  Laterality: N/A;   TOTAL KNEE ARTHROPLASTY Right 02/19/2021   Procedure: RIGHT TOTAL KNEE ARTHROPLASTY;  Surgeon: XLeandrew Koyanagi MD;  [place]  Service: Orthopedics;  Laterality: Right;    Allergies  No Known Allergies   EKGs/Labs/Other Studies Reviewed:   The following studies were reviewed today:  Echocardiogram 05/24/2020 IMPRESSIONS     1. Left ventricular ejection fraction, by estimation, is 60 to 65%. The  left ventricle has normal function. The left ventricle has no regional  wall motion abnormalities. Left ventricular diastolic parameters are  consistent with Grade II diastolic  dysfunction (pseudonormalization).   2. Right ventricular systolic function is normal. The right ventricular  size is normal. There is normal pulmonary artery systolic pressure. The  estimated right ventricular systolic  pressure is 12.2 mmHg.   3. Left atrial size was mild to moderately dilated.   4. The mitral valve is normal in structure. Mild mitral valve  regurgitation. No evidence of mitral stenosis.   5. The aortic valve is tricuspid. Aortic valve regurgitation is trivial.  No aortic stenosis is present.   6. The inferior vena cava is normal in size with greater than 50%  respiratory variability, suggesting right atrial pressure of 3 mmHg.   Comparison(s): 01/14/20 EF 30-35%. PA pressure 63mHg.   FINDINGS   Left Ventricle: Left ventricular ejection fraction, by estimation, is 60  to 65%. The left ventricle has normal function. The left ventricle has no  regional wall motion abnormalities. The left ventricular internal cavity  size was normal in size. There is   no left ventricular hypertrophy. Left ventricular diastolic parameters  are consistent with Grade II diastolic dysfunction (pseudonormalization).   Right Ventricle: The right ventricular size is normal. No increase in  right ventricular wall thickness. Right ventricular systolic function is  normal. There is normal pulmonary artery systolic pressure. The tricuspid  regurgitant velocity is 2.78 m/s, and   with an assumed right atrial pressure of 3 mmHg, the estimated right  ventricular systolic pressure is 348.2mmHg.   Left Atrium: Left atrial size was mild to moderately dilated.   Right Atrium: Right atrial size was normal in size.   Pericardium: Trivial pericardial effusion is present.   Mitral Valve: The mitral valve is normal in structure. Mild mitral valve  regurgitation. No evidence of mitral valve stenosis.   Tricuspid Valve: The tricuspid valve is normal in structure. Tricuspid  valve regurgitation is mild.   Aortic Valve: The aortic valve is tricuspid. Aortic valve regurgitation is  trivial. No aortic stenosis is present.   Pulmonic Valve: The pulmonic valve was normal in structure. Pulmonic valve  regurgitation is  trivial.   Aorta: The aortic root is normal in size and structure.   Venous: The inferior vena cava is normal in size with greater than 50%  respiratory variability, suggesting right atrial pressure of 3 mmHg.   IAS/Shunts: No atrial level shunt detected by color flow Doppler.   EKG:  EKG is  ordered today.  The ekg ordered today demonstrates sinus bradycardia, rate 57 bpm  Recent Labs: 01/08/2022: ALT 17; BUN 9; Creat 0.96; Hemoglobin 12.5; Platelets 433; Potassium 4.7; Sodium 130; TSH 1.87  Recent Lipid Panel    Component Value Date/Time   CHOL 172 01/08/2022 1035   CHOL 186 09/06/2020 1357   TRIG 80 01/08/2022 1035   HDL 85 01/08/2022 1035   HDL 89 09/06/2020 1357   CHOLHDL 2.0 01/08/2022 1035   VLDL 12 01/14/2020 0601   LDLCALC 71 01/08/2022 1035    Risk Assessment/Calculations:   CHA2DS2-VASc Score = 4   This indicates a 4.8% annual risk of stroke. The patient's score is based upon: CHF History: 1 HTN History: 1  Diabetes History: 0 Stroke History: 0 Vascular Disease History: 0 Age Score: 1 Gender Score: 1     Home Medications   Current Meds  Medication Sig   albuterol (VENTOLIN HFA) 108 (90 Base) MCG/ACT inhaler Inhale 1 puff into the lungs every 6 (six) hours as needed for wheezing or shortness of breath.   amiodarone (PACERONE) 200 MG tablet TAKE 1/2 TABLET BY MOUTH EVERY DAY. PLEASE SCHEDULE OVERDUE APPOINTMENT FOR FUTURE REFILLS.   atorvastatin (LIPITOR) 40 MG tablet TAKE 1 TABLET BY MOUTH EVERY DAY   Biotin 5000 MCG TABS Take 5,000 mcg by mouth daily.   CALCIUM GLUCONATE PO Take 600 mg by mouth daily.   Cholecalciferol 25 MCG (1000 UT) tablet Take 1 tablet (1,000 Units total) by mouth daily.   ELIQUIS 5 MG TABS tablet TAKE 1 TABLET BY MOUTH TWICE A DAY   fluticasone (FLONASE) 50 MCG/ACT nasal spray Place 2 sprays into both nostrils daily as needed for allergies.   fluticasone-salmeterol (WIXELA INHUB) 100-50 MCG/ACT AEPB Inhale 1 puff into the lungs 2  (two) times daily.   neomycin-polymyxin-hydrocortisone (CORTISPORIN) OTIC solution Place 3 drops into the right ear 4 (four) times daily.   omeprazole (PRILOSEC) 20 MG capsule TAKE 1 CAPSULE BY MOUTH EVERY DAY   polyethylene glycol (MIRALAX / GLYCOLAX) 17 g packet Take 17 g by mouth daily.   traMADol (ULTRAM) 50 MG tablet TAKE 2 TABLETS BY MOUTH 3 TIMES A DAY AS NEEDED FOR PAIN   [DISCONTINUED] benazepril (LOTENSIN) 20 MG tablet TAKE 1 TABLET BY MOUTH EVERY DAY   [DISCONTINUED] carvedilol (COREG) 3.125 MG tablet TAKE 1 TABLET BY MOUTH 2 TIMES DAILY.   [DISCONTINUED] furosemide (LASIX) 40 MG tablet Take 40 mg by mouth daily.     Review of Systems      All other systems reviewed and are otherwise negative except as noted above.  Physical Exam    VS:  BP 114/60   Pulse 60   Ht '5\' 3"'$  (1.6 m)   Wt 151 lb (68.5 kg)   SpO2 97%   BMI 26.75 kg/m  , BMI Body mass index is 26.75 kg/m.  Wt Readings from Last 3 Encounters:  05/01/22 151 lb (68.5 kg)  03/14/22 150 lb (68 kg)  01/08/22 154 lb 3.2 oz (69.9 kg)     GEN: Well nourished, well developed, in no acute distress. HEENT: normal. Neck: Supple, no JVD, carotid bruits, or masses. Cardiac: RRR, no murmurs, rubs, or gallops. No clubbing, cyanosis, edema.  Radials/PT 2+ and equal bilaterally.  Respiratory:  Respirations regular and unlabored, clear to auscultation bilaterally. GI: Soft, nontender, nondistended. MS: No deformity or atrophy. Skin: Warm and dry, no rash. Neuro:  Strength and sensation are intact. Psych: Normal affect.  Assessment & Plan    Persistent atrial fibrillation -NSR, rate 57 bpm -Continue current medication regiment with Eliquis 5 mg twice daily, Coreg 3.125 mg twice daily, and amiodarone 100 mg daily  Chronic anticoagulation -No issues with bleeding  Chronic systolic heart failure/NICM -No issues with shortness of breath or lower extremity edema -No longer on Lasix  Hypertension -Well-controlled  today -Continue to monitor blood pressure at home -Continue current regimen with Lotensin 20 mg daily and Coreg 3.125 mg twice daily  Hyperlipidemia -LDL 71, HDL 85, total cholesterol 172, triglycerides 80 (12/2021) -Continue Lipitor 40 mg daily        Disposition: Follow up 1 year with Jenkins Rouge, MD or APP.  Signed, Erin Collard, PA-C 05/01/2022, 5:44 PM  [facility]

## 2022-05-01 NOTE — Patient Instructions (Addendum)
Medication Instructions:  Your physician recommends that you continue on your current medications as directed. Please refer to the Current Medication list given to you today.  *If you need a refill on your cardiac medications before your next appointment, please call your pharmacy*   Lab Work: None If you have labs (blood work) drawn today and your tests are completely normal, you will receive your results only by: [place] (if you have MyChart) OR A paper copy in the mail If you have any lab test that is abnormal or we need to change your treatment, we will call you to review the results.  Follow-Up: At [facility], you and your health needs are our priority.  As part of our continuing mission to provide you with exceptional heart care, we have created designated Provider Care Teams.  These Care Teams include your primary Cardiologist (physician) and Advanced Practice Providers (APPs -  Physician Assistants and Nurse Practitioners) who all work together to provide you with the care you need, when you need it.  We recommend signing up for the patient portal called "MyChart".  Sign up information is provided on this After Visit Summary.  MyChart is used to connect with patients for Virtual Visits (Telemedicine).  Patients are able to view lab/test results, encounter notes, upcoming appointments, etc.  Non-urgent messages can be sent to your provider as well.   To learn more about what you can do with MyChart, go to [email or web address].    Your next appointment:   1 year(s)  The format for your next appointment:   In Person  Provider:   Nicholes Rough, PA-C        Important Information About Sugar

## 2022-05-21 ENCOUNTER — Ambulatory Visit (INDEPENDENT_AMBULATORY_CARE_PROVIDER_SITE_OTHER): Payer: Medicare Other

## 2022-05-21 ENCOUNTER — Ambulatory Visit (INDEPENDENT_AMBULATORY_CARE_PROVIDER_SITE_OTHER): Payer: Medicare Other | Admitting: Orthopaedic Surgery

## 2022-05-21 DIAGNOSIS — Z96651 Presence of right artificial knee joint: Secondary | ICD-10-CM | POA: Diagnosis not present

## 2022-05-21 DIAGNOSIS — M25561 Pain in right knee: Secondary | ICD-10-CM | POA: Diagnosis not present
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
## 2022-05-25 ENCOUNTER — Other Ambulatory Visit: Payer: Self-pay | Admitting: Family Medicine

## 2022-05-27 NOTE — Telephone Encounter (Signed)
Requested Prescriptions  Pending Prescriptions Disp Refills  . atorvastatin (LIPITOR) 40 MG tablet [Pharmacy Med Name: ATORVASTATIN 40 MG TABLET] 90 tablet 0    Sig: TAKE 1 TABLET BY MOUTH EVERY DAY     Cardiovascular:  Antilipid - Statins Failed - 05/25/2022  9:36 AM      Failed - Lipid Panel in normal range within the last 12 months    Cholesterol, Total  Date Value Ref Range Status  09/06/2020 186 100 - 199 mg/dL Final   Cholesterol  Date Value Ref Range Status  01/08/2022 172 <200 mg/dL Final   LDL Cholesterol (Calc)  Date Value Ref Range Status  01/08/2022 71 mg/dL (calc) Final    Comment:    Reference range: <100 . Desirable range <100 mg/dL for primary prevention;   <70 mg/dL for patients with CHD or diabetic patients  with > or = 2 CHD risk factors. Marland Kitchen LDL-C is now calculated using the Martin-Hopkins  calculation, which is a validated novel method providing  better accuracy than the Friedewald equation in the  estimation of LDL-C.  Cresenciano Genre et al. Annamaria Helling. 7672;094(70): 2061-2068  (http://education.[employer].com/faq/FAQ164)    HDL  Date Value Ref Range Status  01/08/2022 85 > OR = 50 mg/dL Final  09/06/2020 89 >39 mg/dL Final   Triglycerides  Date Value Ref Range Status  01/08/2022 80 <150 mg/dL Final         Passed - Patient is not pregnant      Passed - Valid encounter within last 12 months    Recent Outpatient Visits          4 months ago General medical exam   [place] [name], MD   7 months ago Other infective acute otitis externa of right ear   [place] [name], MD   11 months ago Acute bacterial rhinosinusitis   [place] Schaumann, Cammie Mcgee, MD   1 year ago Encounter for annual wellness exam in Medicare patient   [place] Eulogio Bear, NP   1 year ago Chronic pain of right knee   [place] Pickard, Cammie Mcgee, MD       Future Appointments            In 8 months Leandrew Koyanagi, MD [facility]

## 2022-07-04 ENCOUNTER — Other Ambulatory Visit: Payer: Self-pay | Admitting: Family Medicine

## 2022-07-09 ENCOUNTER — Other Ambulatory Visit: Payer: Self-pay | Admitting: Family Medicine

## 2022-07-12 ENCOUNTER — Other Ambulatory Visit: Payer: Self-pay | Admitting: Family Medicine

## 2022-07-12 ENCOUNTER — Ambulatory Visit (INDEPENDENT_AMBULATORY_CARE_PROVIDER_SITE_OTHER): Payer: Medicare Other | Admitting: Family Medicine

## 2022-07-12 VITALS — BP 141/72 | HR 76 | Temp 98.4°F | Ht 63.0 in | Wt 151.0 lb

## 2022-07-12 DIAGNOSIS — I4891 Unspecified atrial fibrillation: Secondary | ICD-10-CM | POA: Diagnosis not present

## 2022-07-12 DIAGNOSIS — E7800 Pure hypercholesterolemia, unspecified: Secondary | ICD-10-CM | POA: Diagnosis not present

## 2022-07-12 DIAGNOSIS — N183 Chronic kidney disease, stage 3 unspecified: Secondary | ICD-10-CM | POA: Diagnosis not present

## 2022-07-12 MED ORDER — FLUOXETINE HCL 20 MG PO TABS: 20.0000 mg | ORAL_TABLET | Freq: Every day | ORAL | 3 refills | Status: DC

## 2022-07-12 MED ORDER — APIXABAN 5 MG PO TABS: 5.0000 mg | ORAL_TABLET | Freq: Two times a day (BID) | ORAL | 11 refills | Status: DC

## 2022-07-12 NOTE — Telephone Encounter (Signed)
Requested medication (s) are due for refill today - no  Requested medication (s) are on the active medication list -yes  Future visit scheduled -yes  Last refill: 07/12/22  Notes to clinic: Pharmacy request: Alternative- tablet non formulary   Requested Prescriptions  Pending Prescriptions Disp Refills   FLUoxetine (PROZAC) 20 MG tablet [Pharmacy Med Name: FLUOXETINE HCL 20 MG TABLET] 90 tablet 3    Sig: TAKE 1 TABLET BY [place]     Psychiatry:  Antidepressants - SSRI Failed - 07/12/2022 12:45 PM      Failed - Valid encounter within last 6 months    Recent Outpatient Visits           6 months ago General medical exam   [place] [name], MD   8 months ago Other infective acute otitis externa of right ear   [place] Pickard, Cammie Mcgee, MD   1 year ago Acute bacterial rhinosinusitis   [place] Dennard Schaumann, Cammie Mcgee, MD   1 year ago Encounter for annual wellness exam in Medicare patient   [place] Eulogio Bear, NP   1 year ago Chronic pain of right knee   [place] Pickard, Cammie Mcgee, MD       Future Appointments             In 4 weeks Pickard, Cammie Mcgee, MD [place], [place]   In 1 month Pickard, Cammie Mcgee, MD [place]   In 7 months Erlinda Hong, Marylynn Pearson, MD [place]               Requested Prescriptions  Pending Prescriptions Disp Refills   FLUoxetine (PROZAC) 20 MG tablet [Pharmacy Med Name: FLUOXETINE HCL 20 MG TABLET] 90 tablet 3    Sig: TAKE 1 TABLET BY [place]     Psychiatry:  Antidepressants - SSRI Failed - 07/12/2022 12:45 PM      Failed - Valid encounter within last 6 months    Recent Outpatient Visits           6 months ago General medical exam   [place] [name], MD   8 months ago Other infective acute otitis externa of right ear   [place] Dennard Schaumann, Cammie Mcgee, MD   1 year ago Acute bacterial rhinosinusitis   [place] Dennard Schaumann, Cammie Mcgee, MD   1 year ago Encounter for annual wellness exam in Medicare patient   [place] Eulogio Bear, NP   1 year ago Chronic pain of right knee   [place], Cammie Mcgee, MD       Future Appointments             In 4 weeks Pickard, Cammie Mcgee, MD [place], [place]   In 1 month Pickard, Cammie Mcgee, MD [place], [place]   In 7 months Erlinda Hong, Marylynn Pearson, MD [facility]

## 2022-07-12 NOTE — Progress Notes (Signed)
Subjective:    Patient ID: Erin Wong, female    DOB: 08-20-1951, 71 y.o.   MRN: [id number]  Patient has a history of atrial fibrillation.  She is currently rhythm controlled with amiodarone and also anticoagulated with Eliquis.  She is here today to recheck her lab work including a TSH due to the amiodarone as well as a CBC to monitor for any anemia on Eliquis.  She denies any chest pain shortness of breath or dyspnea on exertion.  However as soon as I walked in the exam room she was crying.  She states that she is crying for no reason.  She states that she finds herself tearful "at the drop of a hat".  She feels stressed out.  She feels like she is anxious and has a difficult time controlling her sadness.  She states that she is crying and sad songs and with little provocation.  She also reports feeling anxious and having more trouble sleeping.  She also reports some depression.  She denies any suicidal ideation or anhedonia Past Medical History:  Diagnosis Date  . Allergy   . Arthritis   . Asthma   . Atrial fibrillation ([place])   . Benign essential HTN   . CHF (congestive heart failure) ([place])   . CKD (chronic kidney disease) stage 3, GFR 30-59 ml/min (HCC)   . Dysrhythmia     Afib -  had Ablation  . GERD (gastroesophageal reflux disease)   . HLD (hyperlipidemia)   . HTN (hypertension)   . Pneumonia    Past Surgical History:  Procedure Laterality Date  . ABDOMINAL HYSTERECTOMY  10/04/1995   Hysterectomy and Bilateral oophorectomy  . CARDIOVERSION N/A 01/17/2020   Procedure: CARDIOVERSION;  Surgeon: [name], MD;  Location[facility] ENDOSCOPY;  Service: Cardiovascular;  Laterality: N/A;  . COLON SURGERY  09/2009   hole in colon  . FRACTURE SURGERY Right 08/02/2011   Right Leg--Plates & Screws  . 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 HARDWARE REMOVAL Right 12/04/2020   Procedure: rigth knee hardware removal;  Surgeon: Leandrew Koyanagi, MD;   Location: [facility];  Service: Orthopedics;  Laterality: Right;  . 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 TEE WITHOUT CARDIOVERSION N/A 01/17/2020   Procedure: TRANSESOPHAGEAL ECHOCARDIOGRAM (TEE);  Surgeon: [name], MD;  Location[facility] ENDOSCOPY;  Service: Cardiovascular;  Laterality: N/A;  . TOTAL KNEE ARTHROPLASTY Right 02/19/2021   Procedure: RIGHT TOTAL KNEE ARTHROPLASTY;  Surgeon: Leandrew Koyanagi, MD;  Location: [place];  Service: Orthopedics;  Laterality: Right;   Current Outpatient Medications on File Prior to Visit  Medication Sig Dispense Refill  . albuterol (VENTOLIN HFA) 108 (90 Base) MCG/ACT inhaler Inhale 1 puff into the lungs every 6 (six) hours as needed for wheezing or shortness of breath. 18 g 3  . amiodarone (PACERONE) 200 MG tablet TAKE 1/2 TABLET BY MOUTH EVERY DAY. PLEASE SCHEDULE OVERDUE APPOINTMENT FOR FUTURE REFILLS. 7 tablet 0  . atorvastatin (LIPITOR) 40 MG tablet TAKE 1 TABLET BY MOUTH EVERY DAY 90 tablet 0  . benazepril (LOTENSIN) 20 MG tablet Take 1 tablet (20 mg total) by mouth daily. 90 tablet 3  . Biotin 5000 MCG TABS Take 5,000 mcg by mouth daily.    Marland Kitchen CALCIUM GLUCONATE PO Take 600 mg by mouth daily.    . carvedilol (COREG) 3.125 MG tablet Take 1 tablet (3.125 mg total) by mouth 2 (two) times  daily. 180 tablet 3  . Cholecalciferol 25 MCG (1000 UT) tablet Take 1 tablet (1,000 Units total) by mouth daily. 90 tablet 1  . ELIQUIS 5 MG TABS tablet TAKE 1 TABLET BY MOUTH TWICE A DAY 60 tablet 11  . fluticasone (FLONASE) 50 MCG/ACT nasal spray Place 2 sprays into both nostrils daily as needed for allergies. 16 g 3  . furosemide (LASIX) 40 MG tablet Take 1 tablet (40 mg total) by mouth daily. 90 tablet 3  . omeprazole (PRILOSEC) 20 MG capsule TAKE 1 CAPSULE BY MOUTH EVERY DAY 90 capsule 3  . polyethylene glycol (MIRALAX / GLYCOLAX) 17 g packet Take 17 g by mouth daily.    . traMADol (ULTRAM) 50 MG tablet TAKE 2 TABLETS BY MOUTH 3 TIMES A DAY AS NEEDED FOR  PAIN 180 tablet 3  . WIXELA INHUB 100-50 MCG/ACT AEPB INHALE 1 PUFF INTO THE LUNGS TWICE A DAY 180 each 1  . neomycin-polymyxin-hydrocortisone (CORTISPORIN) OTIC solution Place 3 drops into the right ear 4 (four) times daily. (Patient not taking: Reported on 07/12/2022) 10 mL 0   No current facility-administered medications on file prior to visit.   No Known Allergies Social History   Socioeconomic History  . Marital status: Married    Spouse name: Not on file  . Number of children: 3  . Years of education: Not on file  . Highest education level: Not on file  Occupational History  . Occupation: retired  Tobacco Use  . Smoking status: Former    Types: Cigarettes    Quit date: 01/10/1991    Years since quitting: 31.5  . Smokeless tobacco: Never  Vaping Use  . Vaping Use: Never used  Substance and Sexual Activity  . Alcohol use: Not Currently    Comment: quit in May/2021  . Drug use: No  . Sexual activity: Never  Other Topics Concern  . Not on file  Social History Narrative  . Not on file   Social Determinants of Health   Financial Resource Strain: Not on file  Food Insecurity: Not on file  Transportation Needs: Not on file  Physical Activity: Not on file  Stress: Not on file  Social Connections: Not on file  Intimate Partner Violence: Not on file     Review of Systems  All other systems reviewed and are negative.      Objective:   Physical Exam Vitals reviewed.  Constitutional:      Appearance: Normal appearance. She is well-developed. She is obese.  HENT:     Right Ear: Tympanic membrane and ear canal normal.     Left Ear: Tympanic membrane and ear canal normal.     Mouth/Throat:     Mouth: Mucous membranes are moist.     Pharynx: No oropharyngeal exudate or posterior oropharyngeal erythema.  Eyes:     Conjunctiva/sclera: Conjunctivae normal.     Pupils: Pupils are equal, round, and reactive to light.  Cardiovascular:     Rate and Rhythm: Normal rate.  Rhythm irregular.     Heart sounds: Normal heart sounds. No murmur heard.    No friction rub. No gallop.  Pulmonary:     Effort: Pulmonary effort is normal. No respiratory distress.     Breath sounds: No wheezing, rhonchi or rales.  Musculoskeletal:     Cervical back: No tenderness.  Lymphadenopathy:     Cervical: No cervical adenopathy.  Neurological:     Mental Status: She is alert.  Assessment & Plan:  Stage 3 chronic kidney disease, unspecified whether stage 3a or 3b CKD ([place]) - Plan: CBC with Differential/Platelet, Lipid panel, COMPLETE METABOLIC PANEL WITH GFR, TSH  Atrial fibrillation, unspecified type ([place]) - Plan: CBC with Differential/Platelet, Lipid panel, COMPLETE METABOLIC PANEL WITH GFR, TSH  Pure hypercholesterolemia - Plan: CBC with Differential/Platelet, Lipid panel, COMPLETE METABOLIC PANEL WITH GFR, TSH Patient's blood pressure is elevated however she is very emotional today.  We will start the patient on Prozac 20 mg daily and recheck the patient in 4 weeks around Christmas time.  At that point hopefully her anxiety and stress and depression will be better.  Meanwhile check CBC CMP lipid panel and TSH.  Goal LDL cholesterol is less than 100.  If labs are normal consider increasing benazepril to 40 mg a day due to hypertension

## 2022-07-13 LAB — COMPLETE METABOLIC PANEL WITH GFR
AG Ratio: 1.7 (calc) (ref 1.0–2.5)
ALT: 16 U/L (ref 6–29)
AST: 15 U/L (ref 10–35)
Albumin: 4.3 g/dL (ref 3.6–5.1)
Alkaline phosphatase (APISO): 87 U/L (ref 37–153)
BUN/Creatinine Ratio: 15 (calc) (ref 6–22)
BUN: 17 mg/dL (ref 7–25)
CO2: 29 mmol/L (ref 20–32)
Calcium: 9.7 mg/dL (ref 8.6–10.4)
Chloride: 100 mmol/L (ref 98–110)
Creat: 1.11 mg/dL — ABNORMAL HIGH (ref 0.60–1.00)
Globulin: 2.6 g/dL (calc) (ref 1.9–3.7)
Glucose, Bld: 79 mg/dL (ref 65–99)
Potassium: 4.5 mmol/L (ref 3.5–5.3)
Sodium: 136 mmol/L (ref 135–146)
Total Bilirubin: 0.4 mg/dL (ref 0.2–1.2)
Total Protein: 6.9 g/dL (ref 6.1–8.1)
eGFR: 53 mL/min/{1.73_m2} — ABNORMAL LOW (ref >=60)

## 2022-07-13 LAB — LIPID PANEL
Cholesterol: 171 mg/dL (ref <200)
HDL: 85 mg/dL (ref >=50)
LDL Cholesterol (Calc): 73 mg/dL (calc)
Non-HDL Cholesterol (Calc): 86 mg/dL (calc) (ref <130)
Total CHOL/HDL Ratio: 2 (calc) (ref <5.0)
Triglycerides: 54 mg/dL (ref <150)

## 2022-07-13 LAB — CBC WITH DIFFERENTIAL/PLATELET
Absolute Monocytes: 1064 cells/uL — ABNORMAL HIGH (ref 200–950)
Basophils Absolute: 57 cells/uL (ref 0–200)
Basophils Relative: 0.6 %
Eosinophils Absolute: 190 cells/uL (ref 15–500)
Eosinophils Relative: 2 %
HCT: 36.7 % (ref 35.0–45.0)
Hemoglobin: 12.4 g/dL (ref 11.7–15.5)
Lymphs Abs: 2622 cells/uL (ref 850–3900)
MCH: 31.4 pg (ref 27.0–33.0)
MCHC: 33.8 g/dL (ref 32.0–36.0)
MCV: 92.9 fL (ref 80.0–100.0)
MPV: 10.6 fL (ref 7.5–12.5)
Monocytes Relative: 11.2 %
Neutro Abs: 5567 cells/uL (ref 1500–7800)
Neutrophils Relative %: 58.6 %
Platelets: 417 10*3/uL — ABNORMAL HIGH (ref 140–400)
RBC: 3.95 10*6/uL (ref 3.80–5.10)
RDW: 12 % (ref 11.0–15.0)
Total Lymphocyte: 27.6 %
WBC: 9.5 10*3/uL (ref 3.8–10.8)

## 2022-07-13 LAB — TSH: TSH: 1.39 mIU/L (ref 0.40–4.50)

## 2022-07-20 IMAGING — RF DG KNEE 3 VIEWS*R*
1 series · 3 of 3 positions shown · non-contrast
Comparison: October 04, 2020.

CLINICAL DATA: Right knee hardware removal.

EXAM:
DG C-ARM 1-60 MIN; RIGHT KNEE - 3 VIEW
FLUOROSCOPY TIME:  Fluoroscopy Time:  14 seconds.
Radiation Exposure Index (if provided by the fluoroscopic device):
0.72 mGy.
Number of Acquired Spot Images: 3

[Series 1: run · 3 of 3 slices shown]
[im 1/3]
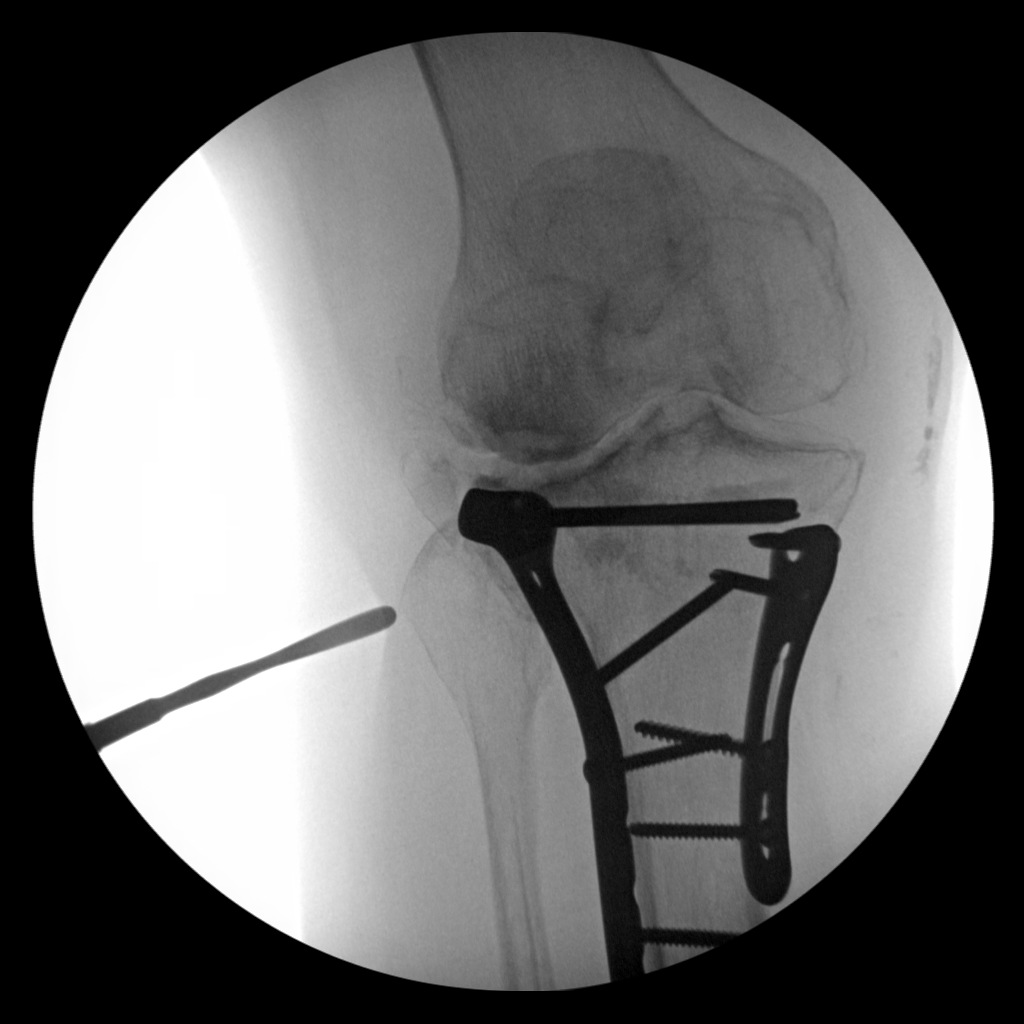
[im 2/3]
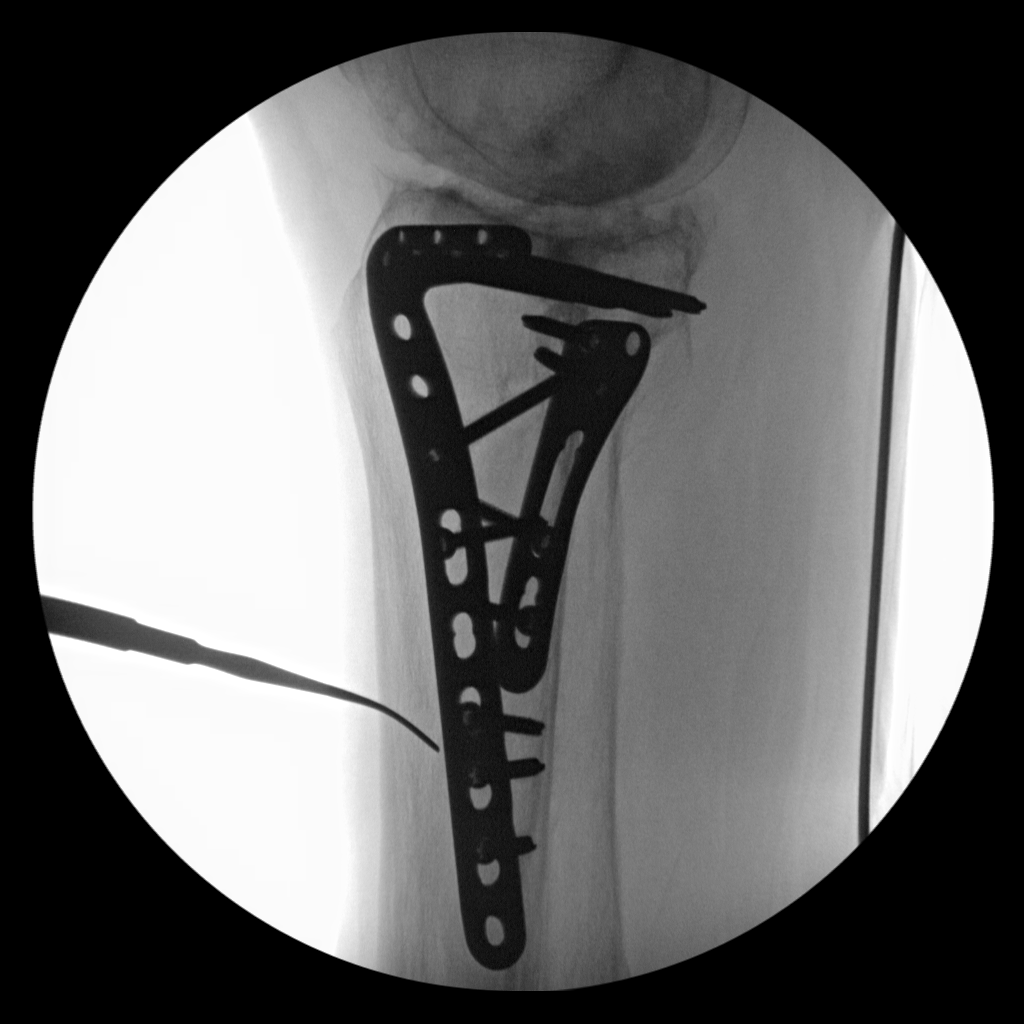
[im 3/3]
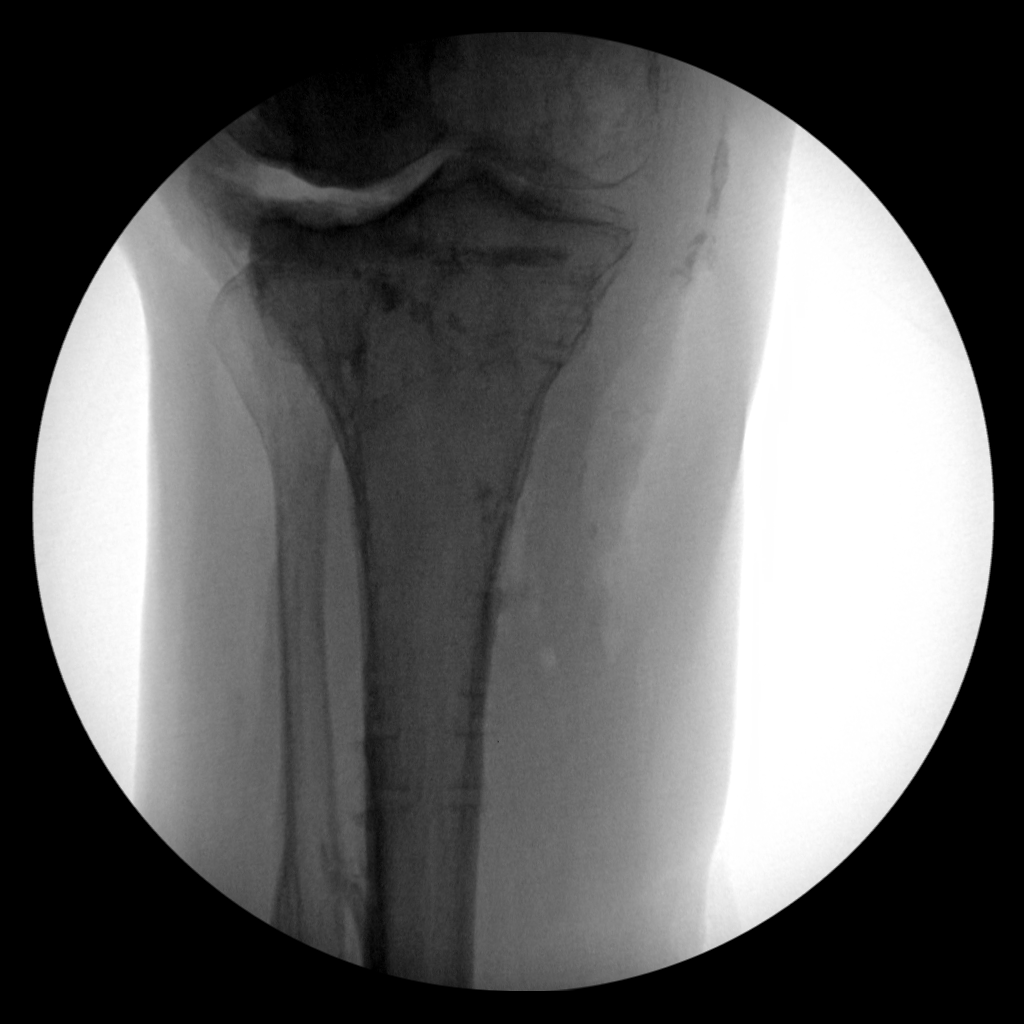

[3 of 3 positions shown; findings below may reference images not displayed]

FINDINGS: Three C-arm fluoroscopic images were obtained intraoperatively and
submitted for post operative interpretation. The first 2 images
demonstrate the patient's proximal tibial hardware in place. The
final image demonstrates interval removal of the hardware. No
unexpected findings. Please see the performing provider's procedural
report for further detail.
IMPRESSION: Right knee hardware removal.

## 2022-08-09 ENCOUNTER — Ambulatory Visit (INDEPENDENT_AMBULATORY_CARE_PROVIDER_SITE_OTHER): Payer: Medicare Other | Admitting: Family Medicine

## 2022-08-09 VITALS — BP 136/78 | HR 63 | Ht 63.0 in | Wt 150.6 lb

## 2022-08-09 DIAGNOSIS — F320 Major depressive disorder, single episode, mild: Secondary | ICD-10-CM

## 2022-08-09 MED ORDER — TRAMADOL HCL 50 MG PO TABS: 100.0000 mg | ORAL_TABLET | Freq: Four times a day (QID) | ORAL | 3 refills | Status: DC | PRN

## 2022-08-09 NOTE — Progress Notes (Signed)
Subjective:    Patient ID: Erin Wong, female    DOB: [birth year], 71 y.o.   MRN: [id number] 07/12/22 Patient has a history of atrial fibrillation.  She is currently rhythm controlled with amiodarone and also anticoagulated with Eliquis.  She is here today to recheck her lab work including a TSH due to the amiodarone as well as a CBC to monitor for any anemia on Eliquis.  She denies any chest pain shortness of breath or dyspnea on exertion.  However as soon as I walked in the exam room she was crying.  She states that she is crying for no reason.  She states that she finds herself tearful "at the drop of a hat".  She feels stressed out.  She feels like she is anxious and has a difficult time controlling her sadness.  She states that she is crying and sad songs and with little provocation.  She also reports feeling anxious and having more trouble sleeping.  She also reports some depression.  She denies any suicidal ideation or anhedonia.  At that time, my plan was:  Patient's blood pressure is elevated however she is very emotional today.  We will start the patient on Prozac 20 mg daily and recheck the patient in 4 weeks around Christmas time.  At that point hopefully her anxiety and stress and depression will be better.  Meanwhile check CBC CMP lipid panel and TSH.  Goal LDL cholesterol is less than 100.  If labs are normal consider increasing benazepril to 40 mg a day due to hypertension  08/09/22 Patient never started the Prozac.  Apparently she states that her insurance would not cover Prozac despite it being generic?Marland Kitchen  Therefore she never started the medication.  She is currently not taking anything for depression.  However she states that she is feeling much better.  She states that she is not crying near as often as she was.  She is not having as many episodes of sadness.  She is feeling better and happier and more optimistic.  She states that her anxiety is better and she is sleeping better. Past  Medical History:  Diagnosis Date   Allergy    Arthritis    Asthma    Atrial fibrillation (HCC)    Benign essential HTN    CHF (congestive heart failure) (HCC)    CKD (chronic kidney disease) stage 3, GFR 30-59 ml/min (HCC)    Dysrhythmia     Afib -  had Ablation   GERD (gastroesophageal reflux disease)    HLD (hyperlipidemia)    HTN (hypertension)    Pneumonia    Past Surgical History:  Procedure Laterality Date   ABDOMINAL HYSTERECTOMY  10/04/1995   Hysterectomy and Bilateral oophorectomy   CARDIOVERSION N/A 01/17/2020   Procedure: CARDIOVERSION;  Surgeon: [name], MD;  Location: [place];  Service: Cardiovascular;  Laterality: N/A;   COLON SURGERY  09/2009   hole in colon   FRACTURE SURGERY Right 08/02/2011   Right Leg--Plates & Screws   HAND SURGERY Right 09/02/2006   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--Arthritis   HARDWARE REMOVAL Right 12/04/2020   Procedure: rigth knee hardware removal;  Surgeon: Leandrew Koyanagi, MD;  Location: [place];  Service: Orthopedics;  Laterality: Right;   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   at time of colon surgery   TEE WITHOUT CARDIOVERSION N/A 01/17/2020   Procedure: TRANSESOPHAGEAL ECHOCARDIOGRAM (TEE);  Surgeon: [name], MD;  Location: [place];  Service: Cardiovascular;  Laterality:  N/A;   TOTAL KNEE ARTHROPLASTY Right 02/19/2021   Procedure: RIGHT TOTAL KNEE ARTHROPLASTY;  Surgeon: Leandrew Koyanagi, MD;  Location: [place];  Service: Orthopedics;  Laterality: Right;   Current Outpatient Medications on File Prior to Visit  Medication Sig Dispense Refill   albuterol (VENTOLIN HFA) 108 (90 Base) MCG/ACT inhaler Inhale 1 puff into the lungs every 6 (six) hours as needed for wheezing or shortness of breath. 18 g 3   amiodarone (PACERONE) 200 MG tablet TAKE 1/2 TABLET BY MOUTH EVERY DAY. PLEASE SCHEDULE OVERDUE APPOINTMENT FOR FUTURE REFILLS. 7 tablet 0   apixaban (ELIQUIS) 5 MG TABS tablet Take 1 tablet (5 mg total) by mouth 2 (two) times daily.  60 tablet 11   atorvastatin (LIPITOR) 40 MG tablet TAKE 1 TABLET BY MOUTH EVERY DAY 90 tablet 0   benazepril (LOTENSIN) 20 MG tablet Take 1 tablet (20 mg total) by mouth daily. 90 tablet 3   Biotin 5000 MCG TABS Take 5,000 mcg by mouth daily.     CALCIUM GLUCONATE PO Take 600 mg by mouth daily.     carvedilol (COREG) 3.125 MG tablet Take 1 tablet (3.125 mg total) by mouth 2 (two) times daily. 180 tablet 3   Cholecalciferol 25 MCG (1000 UT) tablet Take 1 tablet (1,000 Units total) by mouth daily. 90 tablet 1   fluticasone (FLONASE) 50 MCG/ACT nasal spray Place 2 sprays into both nostrils daily as needed for allergies. 16 g 3   furosemide (LASIX) 40 MG tablet Take 1 tablet (40 mg total) by mouth daily. 90 tablet 3   neomycin-polymyxin-hydrocortisone (CORTISPORIN) OTIC solution Place 3 drops into the right ear 4 (four) times daily. 10 mL 0   omeprazole (PRILOSEC) 20 MG capsule TAKE 1 CAPSULE BY MOUTH EVERY DAY 90 capsule 3   polyethylene glycol (MIRALAX / GLYCOLAX) 17 g packet Take 17 g by mouth daily.     WIXELA INHUB 100-50 MCG/ACT AEPB INHALE 1 PUFF INTO THE LUNGS TWICE A DAY 180 each 1   No current facility-administered medications on file prior to visit.   No Known Allergies Social History   Socioeconomic History   Marital status: Married    Spouse name: Not on file   Number of children: 3   Years of education: Not on file   Highest education level: Not on file  Occupational History   Occupation: retired  Tobacco Use   Smoking status: Former    Types: Cigarettes    Quit date: 01/10/1991    Years since quitting: 31.6   Smokeless tobacco: Never  Vaping Use   Vaping Use: Never used  Substance and Sexual Activity   Alcohol use: Not Currently    Comment: quit in May/2021   Drug use: No   Sexual activity: Never  Other Topics Concern   Not on file  Social History Narrative   Not on file   Social Determinants of Health   Financial Resource Strain: Not on file  Food  Insecurity: Not on file  Transportation Needs: Not on file  Physical Activity: Not on file  Stress: Not on file  Social Connections: Not on file  Intimate Partner Violence: Not on file     Review of Systems  All other systems reviewed and are negative.      Objective:   Physical Exam Vitals reviewed.  Constitutional:      Appearance: Normal appearance. She is well-developed. She is obese.  HENT:     Right Ear: Tympanic membrane and  ear canal normal.     Left Ear: Tympanic membrane and ear canal normal.     Mouth/Throat:     Mouth: Mucous membranes are moist.     Pharynx: No oropharyngeal exudate or posterior oropharyngeal erythema.  Eyes:     Conjunctiva/sclera: Conjunctivae normal.     Pupils: Pupils are equal, round, and reactive to light.  Cardiovascular:     Rate and Rhythm: Normal rate. Rhythm irregular.     Heart sounds: Normal heart sounds. No murmur heard.    No friction rub. No gallop.  Pulmonary:     Effort: Pulmonary effort is normal. No respiratory distress.     Breath sounds: No wheezing, rhonchi or rales.  Musculoskeletal:     Cervical back: No tenderness.  Lymphadenopathy:     Cervical: No cervical adenopathy.  Neurological:     Mental Status: She is alert.           Assessment & Plan:  Depression, major, single episode, mild ([place]) At the present time, the patient declines any medication.  She states that she is feeling better.  She primarily came today to touch base with me and give me an update.  At the present time she is feeling well and is doing very good.  Her blood pressure is excellent.  I reviewed her lab work from her last visit which was stable.  She is in normal sinus rhythm today.  She is rate controlled and appropriately anticoagulated.  Her blood pressure is good and she denies any chest pain or shortness of breath.  Her renal function has remained stable.  Therefore I recommended following up in 4 months.  We will not start any  antidepressant at the present time

## 2022-08-21 ENCOUNTER — Other Ambulatory Visit: Payer: Self-pay | Admitting: Family Medicine

## 2022-08-22 ENCOUNTER — Encounter: Payer: Medicare Other | Admitting: Family Medicine

## 2022-08-29 ENCOUNTER — Ambulatory Visit (INDEPENDENT_AMBULATORY_CARE_PROVIDER_SITE_OTHER): Payer: Medicare Other | Admitting: Family Medicine

## 2022-08-29 ENCOUNTER — Encounter: Payer: Self-pay | Admitting: Family Medicine

## 2022-08-29 VITALS — BP 120/68 | HR 59 | Ht 63.0 in | Wt 152.8 lb

## 2022-08-29 DIAGNOSIS — I5022 Chronic systolic (congestive) heart failure: Secondary | ICD-10-CM

## 2022-08-29 DIAGNOSIS — E7800 Pure hypercholesterolemia, unspecified: Secondary | ICD-10-CM | POA: Diagnosis not present

## 2022-08-29 DIAGNOSIS — I4891 Unspecified atrial fibrillation: Secondary | ICD-10-CM | POA: Diagnosis not present

## 2022-08-29 DIAGNOSIS — N183 Chronic kidney disease, stage 3 unspecified: Secondary | ICD-10-CM

## 2022-08-29 NOTE — Progress Notes (Signed)
Subjective:    Patient ID: Erin Wong, female    DOB: 01/12/1951, 71 y.o.   MRN: [id number] Patient is a very pleasant 71 year old Caucasian female here today for complete physical exam.  Past medical history significant for congestive heart failure, atrial fibrillation, hypertension, chronic kidney disease.  However she is in normal sinus rhythm today.  She has a history of an ablation.  She denies any symptoms of congestive heart failure.  She denies any shortness of breath or chest pain.  She denies any falls.  She denies any memory loss.  Recently she has been dealing with more stress and anxiety however today she denies depression.  She is due for a shingles vaccine, COVID booster, and the RSV vaccine.  I recommended all 3 of these.  She had her lab work checked in November.  I reviewed this with her.  Her CBC CMP and lipid panel are outstanding.  Colonoscopy was checked in 2022 and was normal.  Mammogram was checked in May and was normal Past Medical History:  Diagnosis Date   Allergy    Arthritis    Asthma    Atrial fibrillation ([place])    Benign essential HTN    CHF (congestive heart failure) (HCC)    CKD (chronic kidney disease) stage 3, GFR 30-59 ml/min (HCC)    Dysrhythmia     Afib -  had Ablation   GERD (gastroesophageal reflux disease)    HLD (hyperlipidemia)    HTN (hypertension)    Pneumonia    Past Surgical History:  Procedure Laterality Date   ABDOMINAL HYSTERECTOMY  10/04/1995   Hysterectomy and Bilateral oophorectomy   CARDIOVERSION N/A 01/17/2020   Procedure: CARDIOVERSION;  Surgeon: [name], MD;  Location: [place];  Service: Cardiovascular;  Laterality: N/A;   COLON SURGERY  09/2009   hole in colon   FRACTURE SURGERY Right 08/02/2011   Right Leg--Plates & Screws   HAND SURGERY Right 09/02/2006   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--Arthritis   HARDWARE REMOVAL Right 12/04/2020   Procedure: rigth knee hardware removal;  Surgeon: Leandrew Koyanagi, MD;   Location: [place];  Service: Orthopedics;  Laterality: Right;   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   at time of colon surgery   TEE WITHOUT CARDIOVERSION N/A 01/17/2020   Procedure: TRANSESOPHAGEAL ECHOCARDIOGRAM (TEE);  Surgeon: [name], MD;  [facility];  Service: Cardiovascular;  Laterality: N/A;   TOTAL KNEE ARTHROPLASTY Right 02/19/2021   Procedure: RIGHT TOTAL KNEE ARTHROPLASTY;  Surgeon: Leandrew Koyanagi, MD;  Location: [place];  Service: Orthopedics;  Laterality: Right;   Current Outpatient Medications on File Prior to Visit  Medication Sig Dispense Refill   albuterol (VENTOLIN HFA) 108 (90 Base) MCG/ACT inhaler Inhale 1 puff into the lungs every 6 (six) hours as needed for wheezing or shortness of breath. 18 g 3   amiodarone (PACERONE) 200 MG tablet TAKE 1/2 TABLET BY MOUTH EVERY DAY. PLEASE SCHEDULE OVERDUE APPOINTMENT FOR FUTURE REFILLS. 7 tablet 0   apixaban (ELIQUIS) 5 MG TABS tablet Take 1 tablet (5 mg total) by mouth 2 (two) times daily. 60 tablet 11   atorvastatin (LIPITOR) 40 MG tablet TAKE 1 TABLET BY MOUTH EVERY DAY 90 tablet 0   benazepril (LOTENSIN) 20 MG tablet Take 1 tablet (20 mg total) by mouth daily. 90 tablet 3   Biotin 5000 MCG TABS Take 5,000 mcg by mouth daily.     CALCIUM GLUCONATE PO Take 600 mg by mouth daily.  carvedilol (COREG) 3.125 MG tablet Take 1 tablet (3.125 mg total) by mouth 2 (two) times daily. 180 tablet 3   Cholecalciferol 25 MCG (1000 UT) tablet Take 1 tablet (1,000 Units total) by mouth daily. 90 tablet 1   fluticasone (FLONASE) 50 MCG/ACT nasal spray Place 2 sprays into both nostrils daily as needed for allergies. 16 g 3   furosemide (LASIX) 40 MG tablet Take 1 tablet (40 mg total) by mouth daily. 90 tablet 3   omeprazole (PRILOSEC) 20 MG capsule TAKE 1 CAPSULE BY MOUTH EVERY DAY 90 capsule 3   polyethylene glycol (MIRALAX / GLYCOLAX) 17 g packet Take 17 g by mouth daily.     traMADol (ULTRAM) 50 MG tablet Take 2 tablets (100 mg total) by mouth  every 6 (six) hours as needed. 180 tablet 3   WIXELA INHUB 100-50 MCG/ACT AEPB INHALE 1 PUFF INTO THE LUNGS TWICE A DAY 180 each 1   No current facility-administered medications on file prior to visit.   No Known Allergies Social History   Socioeconomic History   Marital status: Married    Spouse name: Not on file   Number of children: 3   Years of education: Not on file   Highest education level: Not on file  Occupational History   Occupation: retired  Tobacco Use   Smoking status: Former    Types: Cigarettes    Quit date: 01/10/1991    Years since quitting: 31.6   Smokeless tobacco: Never  Vaping Use   Vaping Use: Never used  Substance and Sexual Activity   Alcohol use: Not Currently    Comment: quit in May/2021   Drug use: No   Sexual activity: Never  Other Topics Concern   Not on file  Social History Narrative   Not on file   Social Determinants of Health   Financial Resource Strain: Not on file  Food Insecurity: Not on file  Transportation Needs: Not on file  Physical Activity: Not on file  Stress: Not on file  Social Connections: Not on file  Intimate Partner Violence: Not on file     Review of Systems  All other systems reviewed and are negative.      Objective:   Physical Exam Vitals reviewed.  Constitutional:      General: She is not in acute distress.    Appearance: Normal appearance. She is well-developed and normal weight. She is not ill-appearing, toxic-appearing or diaphoretic.  HENT:     Head: Normocephalic and atraumatic.     Right Ear: Tympanic membrane and ear canal normal.     Left Ear: Tympanic membrane and ear canal normal.     Nose: Nose normal. No congestion or rhinorrhea.     Mouth/Throat:     Pharynx: Oropharynx is clear. No oropharyngeal exudate or posterior oropharyngeal erythema.  Eyes:     General: No scleral icterus.       Right eye: No discharge.        Left eye: No discharge.     Extraocular Movements: Extraocular  movements intact.     Conjunctiva/sclera: Conjunctivae normal.     Pupils: Pupils are equal, round, and reactive to light.  Neck:     Vascular: No carotid bruit.  Cardiovascular:     Rate and Rhythm: Normal rate and regular rhythm.     Heart sounds: Normal heart sounds. No murmur heard.    No friction rub. No gallop.  Pulmonary:     Effort: Pulmonary effort is  normal. No respiratory distress.     Breath sounds: Normal breath sounds. No stridor. No wheezing or rales.  Abdominal:     General: Bowel sounds are normal. There is no distension.     Palpations: Abdomen is soft. There is no mass.     Tenderness: There is no abdominal tenderness. There is no guarding or rebound.  Musculoskeletal:        General: Deformity present.     Cervical back: No rigidity.     Right knee: Swelling present. Decreased range of motion. Tenderness present over the medial joint line and lateral joint line.     Right lower leg: No edema.     Left lower leg: No edema.  Lymphadenopathy:     Cervical: No cervical adenopathy.  Skin:    Findings: No lesion or rash.  Neurological:     General: No focal deficit present.     Mental Status: She is alert and oriented to person, place, and time. Mental status is at baseline.     Cranial Nerves: No cranial nerve deficit.     Sensory: No sensory deficit.     Motor: No weakness.     Coordination: Coordination normal.     Gait: Gait normal.  Psychiatric:        Mood and Affect: Mood normal.        Behavior: Behavior normal.        Thought Content: Thought content normal.        Judgment: Judgment normal.           Assessment & Plan:  Stage 3 chronic kidney disease, unspecified whether stage 3a or 3b CKD (HCC)  Pure hypercholesterolemia  Chronic systolic congestive heart failure (HCC)  Atrial fibrillation, unspecified type (HCC) Overall, the patient is doing quite well.  I recommended the shingles vaccine, COVID booster, and the RSV vaccine.  Her  mammogram and colonoscopy are up-to-date.  She does not require Pap smear.  Her lab work from November was excellent aside from her elevated creatinine which is due to her known chronic kidney disease.  Today she is in normal sinus rhythm.  Her blood pressure is well-controlled.  She denies any falls, memory loss, or depression.

## 2022-09-12 ENCOUNTER — Telehealth: Payer: Self-pay

## 2022-09-12 NOTE — Telephone Encounter (Signed)
Pt's daughter called in stating that pt has developed a pretty bad rash, and would like to know if pcp would send something into the pharmacy for this, or does pt need to be seen for this? Please advise.  Cb#: [phone] or call daughter [facility] [phone]

## 2022-09-13 ENCOUNTER — Telehealth: Payer: Self-pay

## 2022-09-13 ENCOUNTER — Other Ambulatory Visit: Payer: Self-pay | Admitting: Family Medicine

## 2022-09-13 MED ORDER — HYDROXYZINE PAMOATE 25 MG PO CAPS: 25.0000 mg | ORAL_CAPSULE | Freq: Three times a day (TID) | ORAL | 0 refills | Status: DC | PRN

## 2022-09-13 MED ORDER — TRIAMCINOLONE ACETONIDE 0.1 % EX CREA: 1.0000 | TOPICAL_CREAM | Freq: Two times a day (BID) | CUTANEOUS | 0 refills | Status: DC

## 2022-09-13 NOTE — Telephone Encounter (Signed)
Pt called and has been breaking out in a red, raised rash that itches and burns for the last 3 weeks. Pt states she has been using Benadryl but it is not helping currently. Pt states she lost 2 of her brothers within a week, last week and thinks that is contributing to the rash. Can something be sent in for her? Thank you.

## 2022-10-02 DIAGNOSIS — H905 Unspecified sensorineural hearing loss: Secondary | ICD-10-CM | POA: Diagnosis not present

## 2022-10-16 DIAGNOSIS — L600 Ingrowing nail: Secondary | ICD-10-CM | POA: Diagnosis not present

## 2022-11-15 ENCOUNTER — Other Ambulatory Visit: Payer: Self-pay | Admitting: Family Medicine

## 2022-12-06 ENCOUNTER — Ambulatory Visit (INDEPENDENT_AMBULATORY_CARE_PROVIDER_SITE_OTHER): Payer: 59 | Admitting: Family Medicine

## 2022-12-06 VITALS — BP 132/80 | HR 57 | Temp 97.9°F | Ht 63.0 in | Wt 152.0 lb

## 2022-12-06 DIAGNOSIS — R42 Dizziness and giddiness: Secondary | ICD-10-CM | POA: Diagnosis not present

## 2022-12-06 DIAGNOSIS — H9311 Tinnitus, right ear: Secondary | ICD-10-CM | POA: Diagnosis not present

## 2022-12-06 NOTE — Progress Notes (Signed)
Subjective:    Patient ID: Erin Wong, female    DOB: September 22, 1950, 72 y.o.   MRN: [id number] Patient presents today complaining of separate issues.  First she reports a dripping sound in her right ear.  She states that it happens on a daily basis.  She states it feels like a pop or a dripping sensation in her ear that occurs over and over every few seconds.  She denies any otalgia.  She has chronic hearing loss.  She also complains of dizziness.  The dizziness primarily occurs when she is standing from a seated position or getting out of bed in the morning.  She denies any vertigo.  Today on examination cranial nerves II through XII are grossly intact with muscle strength 5/5 equal and symmetric in the upper and lower.  She has normal gait.  There is no evidence of ataxia.  She is not following.  Examination of the ear canal is normal.  There is no evidence of any damage to the tympanic membrane.  There is no evidence of a middle ear effusion.  There is no obstruction.  I do not feel that this is Mnire's disease.  The abnormal sound in her ear does not seem to coincide with the dizzy spells and the dizzy spells do not appear to be vertigo but rather disequilibrium. Past Medical History:  Diagnosis Date   Allergy    Arthritis    Asthma    Atrial fibrillation (HCC)    Benign essential HTN    CHF (congestive heart failure) (HCC)    CKD (chronic kidney disease) stage 3, GFR 30-59 ml/min (HCC)    Dysrhythmia     Afib -  had Ablation   GERD (gastroesophageal reflux disease)    HLD (hyperlipidemia)    HTN (hypertension)    Pneumonia    Past Surgical History:  Procedure Laterality Date   ABDOMINAL HYSTERECTOMY  10/04/1995   Hysterectomy and Bilateral oophorectomy   CARDIOVERSION N/A 01/17/2020   Procedure: CARDIOVERSION;  Surgeon: Wendall StadeNishan, Peter C, MD;  [facility];  Service: Cardiovascular;  Laterality: N/A;   COLON SURGERY  09/2009   hole in colon   FRACTURE SURGERY Right  08/02/2011   Right Leg--Plates & Screws   HAND SURGERY Right 09/02/2006   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--Arthritis   HARDWARE REMOVAL Right 12/04/2020   Procedure: rigth knee hardware removal;  Surgeon: Tarry KosXu, Naiping M, MD;  Location: [facility] OR;  Service: Orthopedics;  Laterality: Right;   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   at time of colon surgery   TEE WITHOUT CARDIOVERSION N/A 01/17/2020   Procedure: TRANSESOPHAGEAL ECHOCARDIOGRAM (TEE);  Surgeon: Wendall StadeNishan, Peter C, MD;  [facility];  Service: Cardiovascular;  Laterality: N/A;   TOTAL KNEE ARTHROPLASTY Right 02/19/2021   Procedure: RIGHT TOTAL KNEE ARTHROPLASTY;  Surgeon: Tarry KosXu, Naiping M, MD;  Location: [facility] OR;  Service: Orthopedics;  Laterality: Right;   Current Outpatient Medications on File Prior to Visit  Medication Sig Dispense Refill   albuterol (VENTOLIN HFA) 108 (90 Base) MCG/ACT inhaler Inhale 1 puff into the lungs every 6 (six) hours as needed for wheezing or shortness of breath. 18 g 3   amiodarone (PACERONE) 200 MG tablet TAKE 1/2 TABLET BY MOUTH EVERY DAY. PLEASE SCHEDULE OVERDUE APPOINTMENT FOR FUTURE REFILLS. 7 tablet 0   apixaban (ELIQUIS) 5 MG TABS tablet Take 1 tablet (5 mg total) by mouth 2 (two) times daily. 60 tablet 11   atorvastatin (LIPITOR) 40 MG tablet TAKE 1  TABLET BY MOUTH EVERY DAY 90 tablet 0   benazepril (LOTENSIN) 20 MG tablet Take 1 tablet (20 mg total) by mouth daily. 90 tablet 3   Biotin 5000 MCG TABS Take 5,000 mcg by mouth daily.     CALCIUM GLUCONATE PO Take 600 mg by mouth daily.     carvedilol (COREG) 3.125 MG tablet Take 1 tablet (3.125 mg total) by mouth 2 (two) times daily. 180 tablet 3   Cholecalciferol 25 MCG (1000 UT) tablet Take 1 tablet (1,000 Units total) by mouth daily. 90 tablet 1   fluticasone (FLONASE) 50 MCG/ACT nasal spray Place 2 sprays into both nostrils daily as needed for allergies. 16 g 3   furosemide (LASIX) 40 MG tablet Take 1 tablet (40 mg total) by mouth daily. 90 tablet 3    hydrOXYzine (VISTARIL) 25 MG capsule Take 1 capsule (25 mg total) by mouth every 8 (eight) hours as needed for itching. (Patient not taking: Reported on 12/06/2022) 30 capsule 0   polyethylene glycol (MIRALAX / GLYCOLAX) 17 g packet Take 17 g by mouth daily.     traMADol (ULTRAM) 50 MG tablet Take 2 tablets (100 mg total) by mouth every 6 (six) hours as needed. 180 tablet 3   WIXELA INHUB 100-50 MCG/ACT AEPB INHALE 1 PUFF INTO THE LUNGS TWICE A DAY 180 each 1   No current facility-administered medications on file prior to visit.   No Known Allergies Social History   Socioeconomic History   Marital status: Married    Spouse name: Not on file   Number of children: 3   Years of education: Not on file   Highest education level: Not on file  Occupational History   Occupation: retired  Tobacco Use   Smoking status: Former    Types: Cigarettes    Quit date: 01/10/1991    Years since quitting: 31.9   Smokeless tobacco: Never  Vaping Use   Vaping Use: Never used  Substance and Sexual Activity   Alcohol use: Not Currently    Comment: quit in May/2021   Drug use: No   Sexual activity: Never  Other Topics Concern   Not on file  Social History Narrative   Not on file   Social Determinants of Health   Financial Resource Strain: Not on file  Food Insecurity: Not on file  Transportation Needs: Not on file  Physical Activity: Not on file  Stress: Not on file  Social Connections: Not on file  Intimate Partner Violence: Not on file     Review of Systems  All other systems reviewed and are negative.      Objective:   Physical Exam Vitals reviewed.  Constitutional:      General: She is not in acute distress.    Appearance: Normal appearance. She is well-developed and normal weight. She is not ill-appearing, toxic-appearing or diaphoretic.  HENT:     Head: Normocephalic and atraumatic.     Right Ear: Tympanic membrane and ear canal normal.     Left Ear: Tympanic membrane and ear  canal normal.     Nose: Nose normal. No congestion or rhinorrhea.     Mouth/Throat:     Pharynx: Oropharynx is clear. No oropharyngeal exudate or posterior oropharyngeal erythema.  Eyes:     General: No scleral icterus.       Right eye: No discharge.        Left eye: No discharge.     Extraocular Movements: Extraocular movements intact.  Conjunctiva/sclera: Conjunctivae normal.     Pupils: Pupils are equal, round, and reactive to light.  Neck:     Vascular: No carotid bruit.  Cardiovascular:     Rate and Rhythm: Normal rate and regular rhythm.     Heart sounds: Normal heart sounds. No murmur heard.    No friction rub. No gallop.  Pulmonary:     Effort: Pulmonary effort is normal. No respiratory distress.     Breath sounds: Normal breath sounds. No stridor. No wheezing or rales.  Abdominal:     General: Bowel sounds are normal. There is no distension.     Palpations: Abdomen is soft. There is no mass.     Tenderness: There is no abdominal tenderness. There is no guarding or rebound.  Musculoskeletal:        General: Deformity present.     Cervical back: No rigidity.     Right knee: Swelling present. Decreased range of motion. Tenderness present over the medial joint line and lateral joint line.     Right lower leg: No edema.     Left lower leg: No edema.  Lymphadenopathy:     Cervical: No cervical adenopathy.  Skin:    Findings: No lesion or rash.  Neurological:     General: No focal deficit present.     Mental Status: She is alert and oriented to person, place, and time. Mental status is at baseline.     Cranial Nerves: No cranial nerve deficit.     Sensory: No sensory deficit.     Motor: No weakness.     Coordination: Coordination normal.     Gait: Gait normal.  Psychiatric:        Mood and Affect: Mood normal.        Behavior: Behavior normal.        Thought Content: Thought content normal.        Judgment: Judgment normal.           Assessment & Plan:   Dysequilibrium  Tinnitus of right ear I believe the abnormal sound in the right ear is a type of tinnitus.  I can see no abnormality on her exam.  I have reassured her that I do not feel it is anything dangerous but I will consult ENT for a second opinion given the abnormal history.  I believe the disequilibrium represents orthostatic hypotension.  I have asked her to hold the pill for 1 to 2 weeks and then recheck to see if it is improving.  As an aside she also mentions memory loss.  She mentions that she is leaving the water running.  She mentions that she is repeating objects in the microwave having forgotten that she needed them before.  She is leaving on finances.  She is not having trouble with conversations but she is definitely having compromised short-term memory.  I recommended following up in 2 weeks.  If the dizziness is not improving I would recommend an MRI of the brain.  However I believe that she has age-related cognitive decline possible early dementia.  Return in 2 weeks for recheck to see if dizziness improves off antihypertensive medication

## 2022-12-10 ENCOUNTER — Other Ambulatory Visit: Payer: Self-pay | Admitting: Family Medicine

## 2022-12-13 ENCOUNTER — Telehealth: Payer: Self-pay | Admitting: Family Medicine

## 2022-12-13 NOTE — Telephone Encounter (Signed)
Called patient to schedule Medicare Annual Wellness Visit (AWV). Left message for patient to call back and schedule Medicare Annual Wellness Visit (AWV).  Last date of AWV: 01/08/2022   Please schedule an appointment at any time with Toni Amend, [facility] .  If any questions, please contact me at [phone].  Thank you,  Judeth Cornfield,  [facility] AWV Program Direct Dial ??[phone]

## 2022-12-20 ENCOUNTER — Ambulatory Visit (INDEPENDENT_AMBULATORY_CARE_PROVIDER_SITE_OTHER): Payer: 59 | Admitting: Family Medicine

## 2022-12-20 VITALS — BP 132/72 | HR 65 | Temp 97.9°F | Ht 63.0 in | Wt 150.8 lb

## 2022-12-20 DIAGNOSIS — H9311 Tinnitus, right ear: Secondary | ICD-10-CM | POA: Diagnosis not present

## 2022-12-20 DIAGNOSIS — R42 Dizziness and giddiness: Secondary | ICD-10-CM | POA: Diagnosis not present

## 2022-12-20 NOTE — Progress Notes (Signed)
Subjective:    Patient ID: Erin Wong, female    DOB: [birth year], 72 y.o.   MRN: [id number] 12/06/22 Patient presents today complaining of separate issues.  First she reports a dripping sound in her right ear.  She states that it happens on a daily basis.  She states it feels like a pop or a dripping sensation in her ear that occurs over and over every few seconds.  She denies any otalgia.  She has chronic hearing loss.  She also complains of dizziness.  The dizziness primarily occurs when she is standing from a seated position or getting out of bed in the morning.  She denies any vertigo.  Today on examination cranial nerves II through XII are grossly intact with muscle strength 5/5 equal and symmetric in the upper and lower.  She has normal gait.  There is no evidence of ataxia.  She is not following.  Examination of the ear canal is normal.  There is no evidence of any damage to the tympanic membrane.  There is no evidence of a middle ear effusion.  There is no obstruction.  I do not feel that this is Mnire's disease.  The abnormal sound in her ear does not seem to coincide with the dizzy spells and the dizzy spells do not appear to be vertigo but rather disequilibrium.  At that time, my plan was: I believe the abnormal sound in the right ear is a type of tinnitus.  I can see no abnormality on her exam.  I have reassured her that I do not feel it is anything dangerous but I will consult ENT for a second opinion given the abnormal history.  I believe the disequilibrium represents orthostatic hypotension.  I have asked her to hold the pill for 1 to 2 weeks and then recheck to see if it is improving.  As an aside she also mentions memory loss.  She mentions that she is leaving the water running.  She mentions that she is repeating objects in the microwave having forgotten that she needed them before.  She is leaving on finances.  She is not having trouble with conversations but she is definitely having  compromised short-term memory.  I recommended following up in 2 weeks.  If the dizziness is not improving I would recommend an MRI of the brain.  However I believe that she has age-related cognitive decline possible early dementia.  Return in 2 weeks for recheck to see if dizziness improves off antihypertensive medication  12/20/22 After the last visit, the patient stopped her benazepril.  Her blood pressure has remained excellent.  She has checked her blood pressure every day.  Her systolic blood pressure has been averaging between 119 and 137 with the vast majority around 125.  Her diastolic blood pressure has been between 61 and 71.  She states that her dizziness is much better.  The abnormal tinnitus in her right ear is also resolved.  I believe she was having orthostatic dizziness.  Therefore we spent the remainder of today's visit discussing her memory loss.  I performed a Mini-Mental status exam.  The patient graduated seventh grade.  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  She states that she did poorly in school.  She states that she is terrible at arithmetic.  She remembers 2 out of 3 words on recall.  She easily tells me the date and location.  She is able to spell world in reverse however  she is unable to perform serial sevens and does not even attempt to based on her trouble and embarrassment about her ability to perform math.  Therefore remain mental status is out of 24 or 29 depending upon how you would interpret that.  The patient is easily able to draw the face of a clock with 12 numbers however she forgets the time but I told her to write.  After reminding her, she is unable to draw all the time correctly.  However her husband states that she has always been like this and he denies seeing any change in her memory.  They believe a lot of this could be stress related.  In the last year their family has been stricken with a murder suicide, her son overdosed, 2 of her brothers died  within the last 2 months.  Therefore she has been dealing with grief and depression. Past Medical History:  Diagnosis Date   Allergy    Arthritis    Asthma    Atrial fibrillation (HCC)    Benign essential HTN    CHF (congestive heart failure) (HCC)    CKD (chronic kidney disease) stage 3, GFR 30-59 ml/min (HCC)    Dysrhythmia     Afib -  had Ablation   GERD (gastroesophageal reflux disease)    HLD (hyperlipidemia)    HTN (hypertension)    Pneumonia    Past Surgical History:  Procedure Laterality Date   ABDOMINAL HYSTERECTOMY  10/04/1995   Hysterectomy and Bilateral oophorectomy   CARDIOVERSION N/A 01/17/2020   Procedure: CARDIOVERSION;  Surgeon: Wendall Stade, MD;  Location: [facility] ENDOSCOPY;  Service: Cardiovascular;  Laterality: N/A;   COLON SURGERY  09/2009   hole in colon   FRACTURE SURGERY Right 08/02/2011   Right Leg--Plates & Screws   HAND SURGERY Right 09/02/2006   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--Arthritis   HARDWARE REMOVAL Right 12/04/2020   Procedure: rigth knee hardware removal;  Surgeon: Tarry Kos, MD;  Location: [facility] OR;  Service: Orthopedics;  Laterality: Right;   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   at time of colon surgery   TEE WITHOUT CARDIOVERSION N/A 01/17/2020   Procedure: TRANSESOPHAGEAL ECHOCARDIOGRAM (TEE);  Surgeon: Wendall Stade, MD;  Location: [facility] ENDOSCOPY;  Service: Cardiovascular;  Laterality: N/A;   TOTAL KNEE ARTHROPLASTY Right 02/19/2021   Procedure: RIGHT TOTAL KNEE ARTHROPLASTY;  Surgeon: Tarry Kos, MD;  Location: [facility] OR;  Service: Orthopedics;  Laterality: Right;   Current Outpatient Medications on File Prior to Visit  Medication Sig Dispense Refill   hydrOXYzine (VISTARIL) 25 MG capsule Take 1 capsule (25 mg total) by mouth every 8 (eight) hours as needed for itching. (Patient not taking: Reported on 12/06/2022) 30 capsule 0   albuterol (VENTOLIN HFA) 108 (90 Base) MCG/ACT inhaler Inhale 1 puff into the lungs every 6 (six) hours as needed for wheezing  or shortness of breath. 18 g 3   amiodarone (PACERONE) 200 MG tablet TAKE 1/2 TABLET BY MOUTH EVERY DAY. PLEASE SCHEDULE OVERDUE APPOINTMENT FOR FUTURE REFILLS. 7 tablet 0   apixaban (ELIQUIS) 5 MG TABS tablet Take 1 tablet (5 mg total) by mouth 2 (two) times daily. 60 tablet 11   atorvastatin (LIPITOR) 40 MG tablet TAKE 1 TABLET BY MOUTH EVERY DAY 90 tablet 0   benazepril (LOTENSIN) 20 MG tablet Take 1 tablet (20 mg total) by mouth daily. 90 tablet 3   Biotin 5000 MCG TABS Take 5,000 mcg by mouth daily.     CALCIUM GLUCONATE PO  Take 600 mg by mouth daily.     carvedilol (COREG) 3.125 MG tablet Take 1 tablet (3.125 mg total) by mouth 2 (two) times daily. 180 tablet 3   Cholecalciferol 25 MCG (1000 UT) tablet Take 1 tablet (1,000 Units total) by mouth daily. 90 tablet 1   fluticasone (FLONASE) 50 MCG/ACT nasal spray Place 2 sprays into both nostrils daily as needed for allergies. 16 g 3   furosemide (LASIX) 40 MG tablet Take 1 tablet (40 mg total) by mouth daily. 90 tablet 3   polyethylene glycol (MIRALAX / GLYCOLAX) 17 g packet Take 17 g by mouth daily.     traMADol (ULTRAM) 50 MG tablet TAKE 2 TABLETS BY MOUTH EVERY 6 HOURS AS NEEDED. 180 tablet 3   WIXELA INHUB 100-50 MCG/ACT AEPB INHALE 1 PUFF INTO THE LUNGS TWICE A DAY 180 each 1   No current facility-administered medications on file prior to visit.   No Known Allergies Social History   Socioeconomic History   Marital status: Married    Spouse name: Not on file   Number of children: 3   Years of education: Not on file   Highest education level: Not on file  Occupational History   Occupation: retired  Tobacco Use   Smoking status: Former    Types: Cigarettes    Quit date: 01/10/1991    Years since quitting: 31.9   Smokeless tobacco: Never  Vaping Use   Vaping Use: Never used  Substance and Sexual Activity   Alcohol use: Not Currently    Comment: quit in May/2021   Drug use: No   Sexual activity: Never  Other Topics Concern    Not on file  Social History Narrative   Not on file   Social Determinants of Health   Financial Resource Strain: Not on file  Food Insecurity: Not on file  Transportation Needs: Not on file  Physical Activity: Not on file  Stress: Not on file  Social Connections: Not on file  Intimate Partner Violence: Not on file     Review of Systems  All other systems reviewed and are negative.      Objective:   Physical Exam Vitals reviewed.  Constitutional:      General: She is not in acute distress.    Appearance: Normal appearance. She is well-developed and normal weight. She is not ill-appearing, toxic-appearing or diaphoretic.  HENT:     Head: Normocephalic and atraumatic.     Right Ear: Tympanic membrane and ear canal normal.     Left Ear: Tympanic membrane and ear canal normal.     Nose: Nose normal. No congestion or rhinorrhea.     Mouth/Throat:     Pharynx: Oropharynx is clear. No oropharyngeal exudate or posterior oropharyngeal erythema.  Eyes:     General: No scleral icterus.       Right eye: No discharge.        Left eye: No discharge.     Extraocular Movements: Extraocular movements intact.     Conjunctiva/sclera: Conjunctivae normal.     Pupils: Pupils are equal, round, and reactive to light.  Neck:     Vascular: No carotid bruit.  Cardiovascular:     Rate and Rhythm: Normal rate and regular rhythm.     Heart sounds: Normal heart sounds. No murmur heard.    No friction rub. No gallop.  Pulmonary:     Effort: Pulmonary effort is normal. No respiratory distress.     Breath sounds: Normal breath  sounds. No stridor. No wheezing or rales.  Abdominal:     General: Bowel sounds are normal. There is no distension.     Palpations: Abdomen is soft. There is no mass.     Tenderness: There is no abdominal tenderness. There is no guarding or rebound.  Musculoskeletal:        General: Deformity present.     Cervical back: No rigidity.     Right knee: Swelling present.  Decreased range of motion. Tenderness present over the medial joint line and lateral joint line.     Right lower leg: No edema.     Left lower leg: No edema.  Lymphadenopathy:     Cervical: No cervical adenopathy.  Skin:    Findings: No lesion or rash.  Neurological:     General: No focal deficit present.     Mental Status: She is alert and oriented to person, place, and time. Mental status is at baseline.     Cranial Nerves: No cranial nerve deficit.     Sensory: No sensory deficit.     Motor: No weakness.     Coordination: Coordination normal.     Gait: Gait normal.  Psychiatric:        Mood and Affect: Mood normal.        Behavior: Behavior normal.        Thought Content: Thought content normal.        Judgment: Judgment normal.          Assessment & Plan:  Dysequilibrium  Tinnitus of right ear I believe the disequilibrium was related to orthostatic hypotension.  Patient feels very hyponasal consent.  Her blood pressures at home are outstanding.  She does have some mild cognitive impairment but some of this may be her baseline.  At the present time we have decided simply to monitor the situation.  I do not have compelling evidence that the patient will benefit from Aricept at the present time.  We discussed this at length.  The patient and her husband are comfortable with this plan

## 2022-12-26 ENCOUNTER — Other Ambulatory Visit: Payer: Self-pay | Admitting: Family Medicine

## 2023-01-16 ENCOUNTER — Other Ambulatory Visit: Payer: Self-pay | Admitting: Internal Medicine

## 2023-01-16 ENCOUNTER — Ambulatory Visit (INDEPENDENT_AMBULATORY_CARE_PROVIDER_SITE_OTHER): Payer: 59

## 2023-01-16 VITALS — BP 128/74 | Ht 63.0 in | Wt 150.8 lb

## 2023-01-16 DIAGNOSIS — Z0000 Encounter for general adult medical examination without abnormal findings: Secondary | ICD-10-CM

## 2023-01-16 DIAGNOSIS — Z1231 Encounter for screening mammogram for malignant neoplasm of breast: Secondary | ICD-10-CM | POA: Diagnosis not present

## 2023-01-16 NOTE — Patient Instructions (Addendum)
Erin Wong , Thank you for taking time to come for your Medicare Wellness Visit. I appreciate your ongoing commitment to your health goals. Please review the following plan we discussed and let me know if I can assist you in the future.   These are the goals we discussed:  Goals   None     This is a list of the screening recommended for you and due dates:  Health Maintenance  Topic Date Due   Zoster (Shingles) Vaccine (1 of 2) Never done   COVID-19 Vaccine (4 - 2023-24 season) 11/08/2022   Medicare Annual Wellness Visit  01/09/2023   Flu Shot  04/03/2023   Colon Cancer Screening  09/13/2023   Mammogram  01/19/2024   DTaP/Tdap/Td vaccine (2 - Td or Tdap) 04/22/2025   Pneumonia Vaccine  Completed   DEXA scan (bone density measurement)  Completed   Hepatitis C Screening: USPSTF Recommendation to screen - Ages 28-79 yo.  Completed   HPV Vaccine  Aged Out    Advanced directives: Please bring a copy of your health care power of attorney and living will to the office to be added to your chart at your convenience.   Conditions/risks identified: Aim for 30 minutes of exercise or brisk walking, 6-8 glasses of water, and 5 servings of fruits and vegetables each day.  Next appointment: Follow up in one year for your annual wellness visit    Preventive Care 65 Years and Older, Female Preventive care refers to lifestyle choices and visits with your health care provider that can promote health and wellness. What does preventive care include? A yearly physical exam. This is also called an annual well check. Dental exams once or twice a year. Routine eye exams. Ask your health care provider how often you should have your eyes checked. Personal lifestyle choices, including: Daily care of your teeth and gums. Regular physical activity. Eating a healthy diet. Avoiding tobacco and drug use. Limiting alcohol use. Practicing safe sex. Taking low-dose aspirin every day. Taking vitamin and mineral  supplements as recommended by your health care provider. What happens during an annual well check? The services and screenings done by your health care provider during your annual well check will depend on your age, overall health, lifestyle risk factors, and family history of disease. Counseling  Your health care provider may ask you questions about your: Alcohol use. Tobacco use. Drug use. Emotional well-being. Home and relationship well-being. Sexual activity. Eating habits. History of falls. Memory and ability to understand (cognition). Work and work Astronomer. Reproductive health. Screening  You may have the following tests or measurements: Height, weight, and BMI. Blood pressure. Lipid and cholesterol levels. These may be checked every 5 years, or more frequently if you are over 31 years old. Skin check. Lung cancer screening. You may have this screening every year starting at age 4 if you have a 30-pack-year history of smoking and currently smoke or have quit within the past 15 years. Fecal occult blood test (FOBT) of the stool. You may have this test every year starting at age 89. Flexible sigmoidoscopy or colonoscopy. You may have a sigmoidoscopy every 5 years or a colonoscopy every 10 years starting at age 89. Hepatitis C blood test. Hepatitis B blood test. Sexually transmitted disease (STD) testing. Diabetes screening. This is done by checking your blood sugar (glucose) after you have not eaten for a while (fasting). You may have this done every 1-3 years. Bone density scan. This is done to screen  for osteoporosis. You may have this done starting at age 59. Mammogram. This may be done every 1-2 years. Talk to your health care provider about how often you should have regular mammograms. Talk with your health care provider about your test results, treatment options, and if necessary, the need for more tests. Vaccines  Your health care provider may recommend certain  vaccines, such as: Influenza vaccine. This is recommended every year. Tetanus, diphtheria, and acellular pertussis (Tdap, Td) vaccine. You may need a Td booster every 10 years. Zoster vaccine. You may need this after age 14. Pneumococcal 13-valent conjugate (PCV13) vaccine. One dose is recommended after age 64. Pneumococcal polysaccharide (PPSV23) vaccine. One dose is recommended after age 22. Talk to your health care provider about which screenings and vaccines you need and how often you need them. This information is not intended to replace advice given to you by your health care provider. Make sure you discuss any questions you have with your health care provider. Document Released: 09/15/2015 Document Revised: 05/08/2016 Document Reviewed: 06/20/2015 Elsevier Interactive Patient Education  2017 [employer] Prevention in the Home Falls can cause injuries. They can happen to people of all ages. There are many things you can do to make your home safe and to help prevent falls. What can I do on the outside of my home? Regularly fix the edges of walkways and driveways and fix any cracks. Remove anything that might make you trip as you walk through a door, such as a raised step or threshold. Trim any bushes or trees on the path to your home. Use bright outdoor lighting. Clear any walking paths of anything that might make someone trip, such as rocks or tools. Regularly check to see if handrails are loose or broken. Make sure that both sides of any steps have handrails. Any raised decks and porches should have guardrails on the edges. Have any leaves, snow, or ice cleared regularly. Use sand or salt on walking paths during winter. Clean up any spills in your garage right away. This includes oil or grease spills. What can I do in the bathroom? Use night lights. Install grab bars by the toilet and in the tub and shower. Do not use towel bars as grab bars. Use non-skid mats or decals in  the tub or shower. If you need to sit down in the shower, use a plastic, non-slip stool. Keep the floor dry. Clean up any water that spills on the floor as soon as it happens. Remove soap buildup in the tub or shower regularly. Attach bath mats securely with double-sided non-slip rug tape. Do not have throw rugs and other things on the floor that can make you trip. What can I do in the bedroom? Use night lights. Make sure that you have a light by your bed that is easy to reach. Do not use any sheets or blankets that are too big for your bed. They should not hang down onto the floor. Have a firm chair that has side arms. You can use this for support while you get dressed. Do not have throw rugs and other things on the floor that can make you trip. What can I do in the kitchen? Clean up any spills right away. Avoid walking on wet floors. Keep items that you use a lot in easy-to-reach places. If you need to reach something above you, use a strong step stool that has a grab bar. Keep electrical cords out of the way. Do  not use floor polish or wax that makes floors slippery. If you must use wax, use non-skid floor wax. Do not have throw rugs and other things on the floor that can make you trip. What can I do with my stairs? Do not leave any items on the stairs. Make sure that there are handrails on both sides of the stairs and use them. Fix handrails that are broken or loose. Make sure that handrails are as long as the stairways. Check any carpeting to make sure that it is firmly attached to the stairs. Fix any carpet that is loose or worn. Avoid having throw rugs at the top or bottom of the stairs. If you do have throw rugs, attach them to the floor with carpet tape. Make sure that you have a light switch at the top of the stairs and the bottom of the stairs. If you do not have them, ask someone to add them for you. What else can I do to help prevent falls? Wear shoes that: Do not have high  heels. Have rubber bottoms. Are comfortable and fit you well. Are closed at the toe. Do not wear sandals. If you use a stepladder: Make sure that it is fully opened. Do not climb a closed stepladder. Make sure that both sides of the stepladder are locked into place. Ask someone to hold it for you, if possible. Clearly mark and make sure that you can see: Any grab bars or handrails. First and last steps. Where the edge of each step is. Use tools that help you move around (mobility aids) if they are needed. These include: Canes. Walkers. Scooters. Crutches. Turn on the lights when you go into a dark area. Replace any light bulbs as soon as they burn out. Set up your furniture so you have a clear path. Avoid moving your furniture around. If any of your floors are uneven, fix them. If there are any pets around you, be aware of where they are. Review your medicines with your doctor. Some medicines can make you feel dizzy. This can increase your chance of falling. Ask your doctor what other things that you can do to help prevent falls. This information is not intended to replace advice given to you by your health care provider. Make sure you discuss any questions you have with your health care provider. Document Released: 06/15/2009 Document Revised: 01/25/2016 Document Reviewed: 09/23/2014 Elsevier Interactive Patient Education  2017 [employer].

## 2023-01-16 NOTE — Progress Notes (Signed)
Subjective:   Erin Wong is a 72 y.o. female who presents for Medicare Annual (Subsequent) preventive examination.  Review of Systems     Cardiac Risk Factors include: advanced age (>10men, >23 women);hypertension;dyslipidemia;sedentary lifestyle     Objective:    Today's Vitals   01/16/23 1217  BP: 128/74  Weight: 150 lb 12.8 oz (68.4 kg)  Height: 5\' 3"  (1.6 m)   Body mass index is 26.71 kg/m.     01/16/2023    1:05 PM 03/07/2021   11:31 AM 02/19/2021    1:25 PM 02/15/2021    9:54 AM 01/16/2021    2:25 PM 01/01/2021   12:45 PM 12/04/2020    9:14 AM  Advanced Directives  Does Patient Have a Medical Advance Directive? Yes No No No No No No  Type of Advance Directive Living will;Healthcare Power of Attorney        Does patient want to make changes to medical advance directive? No - Patient declined        Copy of Healthcare Power of Attorney in Chart? No - copy requested        Would patient like information on creating a medical advance directive?  No - Patient declined No - Patient declined No - Patient declined No - Patient declined Yes (ED - Information included in AVS) No - Patient declined    Current Medications (verified) Outpatient Encounter Medications as of 01/16/2023  Medication Sig   albuterol (VENTOLIN HFA) 108 (90 Base) MCG/ACT inhaler Inhale 1 puff into the lungs every 6 (six) hours as needed for wheezing or shortness of breath.   amiodarone (PACERONE) 200 MG tablet TAKE 1/2 TABLET BY MOUTH EVERY DAY. PLEASE SCHEDULE OVERDUE APPOINTMENT FOR FUTURE REFILLS.   apixaban (ELIQUIS) 5 MG TABS tablet Take 1 tablet (5 mg total) by mouth 2 (two) times daily.   atorvastatin (LIPITOR) 40 MG tablet TAKE 1 TABLET BY MOUTH EVERY DAY   Biotin 5000 MCG TABS Take 5,000 mcg by mouth daily.   CALCIUM GLUCONATE PO Take 600 mg by mouth daily.   carvedilol (COREG) 3.125 MG tablet Take 1 tablet (3.125 mg total) by mouth 2 (two) times daily.   Cholecalciferol 25 MCG (1000 UT) tablet  Take 1 tablet (1,000 Units total) by mouth daily.   fluticasone (FLONASE) 50 MCG/ACT nasal spray Place 2 sprays into both nostrils daily as needed for allergies.   furosemide (LASIX) 40 MG tablet Take 1 tablet (40 mg total) by mouth daily.   hydrOXYzine (VISTARIL) 25 MG capsule Take 1 capsule (25 mg total) by mouth every 8 (eight) hours as needed for itching. (Patient not taking: Reported on 01/16/2023)   polyethylene glycol (MIRALAX / GLYCOLAX) 17 g packet Take 17 g by mouth daily.   traMADol (ULTRAM) 50 MG tablet TAKE 2 TABLETS BY MOUTH EVERY 6 HOURS AS NEEDED.   WIXELA INHUB 100-50 MCG/ACT AEPB INHALE 1 PUFF INTO THE LUNGS TWICE A DAY   No facility-administered encounter medications on file as of 01/16/2023.    Allergies (verified) Patient has no known allergies.   History: Past Medical History:  Diagnosis Date   Allergy    Arthritis    Asthma    Atrial fibrillation (HCC)    Benign essential HTN    CHF (congestive heart failure) (HCC)    CKD (chronic kidney disease) stage 3, GFR 30-59 ml/min (HCC)    Dysrhythmia     Afib -  had Ablation   GERD (gastroesophageal reflux disease)  HLD (hyperlipidemia)    HTN (hypertension)    Pneumonia    Past Surgical History:  Procedure Laterality Date   ABDOMINAL HYSTERECTOMY  10/04/1995   Hysterectomy and Bilateral oophorectomy   CARDIOVERSION N/A 01/17/2020   Procedure: CARDIOVERSION;  Surgeon: Wendall Stade, MD;  Location: [facility] ENDOSCOPY;  Service: Cardiovascular;  Laterality: N/A;   COLON SURGERY  09/2009   hole in colon   FRACTURE SURGERY Right 08/02/2011   Right Leg--Plates & Screws   HAND SURGERY Right 09/02/2006   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--Arthritis   HARDWARE REMOVAL Right 12/04/2020   Procedure: rigth knee hardware removal;  Surgeon: Tarry Kos, MD;  Location: [facility] OR;  Service: Orthopedics;  Laterality: Right;   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   at time of colon surgery   TEE WITHOUT CARDIOVERSION N/A 01/17/2020   Procedure:  TRANSESOPHAGEAL ECHOCARDIOGRAM (TEE);  Surgeon: Wendall Stade, MD;  Location: [facility]COPY;  Service: Cardiovascular;  Laterality: N/A;   TOTAL KNEE ARTHROPLASTY Right 02/19/2021   Procedure: RIGHT TOTAL KNEE ARTHROPLASTY;  Surgeon: Tarry Kos, MD;  Location: [facility] OR;  Service: Orthopedics;  Laterality: Right;   Family History  Problem Relation Age of Onset   Arthritis Mother    Miscarriages / [place] Mother    Lung cancer Mother        Had quit smoking for 25 years   Esophageal cancer Mother    Alcohol abuse Father    Alcohol abuse Brother    Kidney cancer Brother    Breast cancer Sister    COPD Brother    Asthma Maternal Aunt    Diabetes Maternal Aunt    Colon cancer Neg Hx    Inflammatory bowel disease Neg Hx    Liver disease Neg Hx    Pancreatic cancer Neg Hx    Rectal cancer Neg Hx    Stomach cancer Neg Hx    Social History   Socioeconomic History   Marital status: Married    Spouse name: Not on file   Number of children: 3   Years of education: Not on file   Highest education level: Not on file  Occupational History   Occupation: retired  Tobacco Use   Smoking status: Former    Types: Cigarettes    Quit date: 01/10/1991    Years since quitting: 32.0   Smokeless tobacco: Never  Vaping Use   Vaping Use: Never used  Substance and Sexual Activity   Alcohol use: Not Currently    Comment: quit in May/2021   Drug use: No   Sexual activity: Never  Other Topics Concern   Not on file  Social History Narrative   Not on file   Social Determinants of Health   Financial Resource Strain: Low Risk  (01/16/2023)   Overall Financial Resource Strain (CARDIA)    Difficulty of Paying Living Expenses: Not hard at all  Food Insecurity: No Food Insecurity (01/16/2023)   Hunger Vital Sign    Worried About Running Out of Food in the Last Year: Never true    Ran Out of Food in the Last Year: Never true  Transportation Needs: No Transportation Needs (01/16/2023)   PRAPARE -  Administrator, Civil Service (Medical): No    Lack of Transportation (Non-Medical): No  Physical Activity: Insufficiently Active (01/16/2023)   Exercise Vital Sign    Days of Exercise per Week: 3 days    Minutes of Exercise per Session: 30 min  Stress: No Stress Concern  Present (01/16/2023)   [employer] of Occupational Health - Occupational Stress Questionnaire    Feeling of Stress : Not at all  Social Connections: Moderately Integrated (01/16/2023)   Social Connection and Isolation Panel [NHANES]    Frequency of Communication with Friends and Family: More than three times a week    Frequency of Social Gatherings with Friends and Family: Three times a week    Attends Religious Services: 1 to 4 times per year    Active Member of Clubs or Organizations: No    Attends Banker Meetings: Never    Marital Status: Married    Tobacco Counseling Counseling given: Not Answered   Clinical Intake:  Pre-visit preparation completed: Yes  Pain : No/denies pain     Diabetes: No  How often do you need to have someone help you when you read instructions, pamphlets, or other written materials from your doctor or pharmacy?: 1 - Never  Diabetic?No  Interpreter Needed?: No  Information entered by :: Kandis Fantasia LPN   Activities of Daily Living    01/16/2023    1:04 PM  In your present state of health, do you have any difficulty performing the following activities:  Hearing? 0  Vision? 0  Difficulty concentrating or making decisions? 0  Walking or climbing stairs? 0  Dressing or bathing? 0  Doing errands, shopping? 0  Preparing Food and eating ? N  Using the Toilet? N  In the past six months, have you accidently leaked urine? N  Do you have problems with loss of bowel control? N  Managing your Medications? N  Managing your Finances? N  Housekeeping or managing your Housekeeping? N    Patient Care Team: Donita Brooks, MD as PCP - General  (Family Medicine) Wendall Stade, MD as PCP - Cardiology [facility] [employer] Tarry Kos, MD as Attending Physician (Orthopedic Surgery)  Indicate any recent Medical Services you may have received from other than [facility] providers in the past year (date may be approximate).     Assessment:   This is a routine wellness examination for [place].  Hearing/Vision screen Hearing Screening - Comments:: Bilateral hearing loss; hearing aids  Vision Screening - Comments:: No vision problems; will schedule routine eye exam soon    Dietary issues and exercise activities discussed: Current Exercise Habits: Home exercise routine, Type of exercise: walking, Time (Minutes): 30, Frequency (Times/Week): 3, Weekly Exercise (Minutes/Week): 90, Intensity: Mild   Goals Addressed             This Visit's Progress    Remain active and independent        Depression Screen    01/16/2023    1:02 PM 08/29/2022    9:38 AM 07/12/2022   11:40 AM 07/12/2022   11:31 AM 01/01/2021   12:55 PM 01/01/2021   12:44 PM 05/06/2018   10:25 AM  PHQ 2/9 Scores  PHQ - 2 Score 0 0 4 0 0 0 0  PHQ- 9 Score  0 12 0       Fall Risk    01/16/2023    1:04 PM 08/29/2022    9:38 AM 07/12/2022   11:31 AM 01/08/2022    9:31 AM 01/08/2022    9:28 AM  Fall Risk   Falls in the past year? 1 0 0 1 0  Number falls in past yr: 0 0  1 0  Injury with Fall? 0 0  1 0  Risk for fall due  to : History of fall(s);Impaired balance/gait No Fall Risks  Other (Comment)   Risk for fall due to: Comment    per pt trip and fell backward   Follow up Falls evaluation completed;Education provided;Falls prevention discussed Falls prevention discussed       FALL RISK PREVENTION PERTAINING TO THE HOME:  Any stairs in or around the home? No  If so, are there any without handrails? No  Home free of loose throw rugs in walkways, pet beds, electrical cords, etc? Yes  Adequate lighting in your home to reduce risk of falls? Yes    ASSISTIVE DEVICES UTILIZED TO PREVENT FALLS:  Life alert? No  Use of a cane, walker or w/c? No  Grab bars in the bathroom? Yes  Shower chair or bench in shower? No  Elevated toilet seat or a handicapped toilet? Yes   TIMED UP AND GO:  Was the test performed? Yes .  Length of time to ambulate 10 feet: 6 sec.   Gait steady and fast without use of assistive device  Cognitive Function:        01/16/2023    1:05 PM 01/01/2021   12:56 PM 01/01/2021   12:44 PM 05/06/2018   10:29 AM  6CIT Screen  What Year? 0 points 0 points 0 points 0 points  What month? 0 points 0 points 0 points 0 points  What time? 0 points 0 points 0 points 0 points  Count back from 20 0 points 0 points 0 points 0 points  Months in reverse 2 points 0 points 0 points 2 points  Repeat phrase 4 points 4 points 0 points 2 points  Total Score 6 points 4 points 0 points 4 points    Immunizations Immunization History  Administered Date(s) Administered   COVID-19, mRNA, vaccine(Comirnaty)12 years and older 09/13/2022   Fluad Quad(high Dose 65+) 05/26/2020, 07/05/2022   Hepatitis B 11/21/2004, 12/19/2004, 08/13/2005   Influenza, High Dose Seasonal PF 06/06/2016, 06/03/2017, 08/17/2018, 05/26/2019   Influenza,inj,Quad PF,6+ Mos 07/17/2015   PFIZER(Purple Top)SARS-COV-2 Vaccination 11/25/2019, 12/20/2019   Pneumococcal Conjugate-13 05/06/2018   Pneumococcal Polysaccharide-23 08/01/2016   Respiratory Syncytial Virus Vaccine,Recomb Aduvanted(Arexvy) 09/13/2022   Tdap 04/23/2015    TDAP status: Up to date  Pneumococcal vaccine status: Up to date  Covid-19 vaccine status: Information provided on how to obtain vaccines.   Qualifies for Shingles Vaccine? Yes   Zostavax completed No   Shingrix Completed?: No.    Education has been provided regarding the importance of this vaccine. Patient has been advised to call insurance company to determine out of pocket expense if they have not yet received this vaccine. Advised  may also receive vaccine at local pharmacy or Health Dept. Verbalized acceptance and understanding.  Screening Tests Health Maintenance  Topic Date Due   Zoster Vaccines- Shingrix (1 of 2) Never done   COVID-19 Vaccine (4 - 2023-24 season) 11/08/2022   INFLUENZA VACCINE  04/03/2023   COLONOSCOPY (Pts 45-6yrs Insurance coverage will need to be confirmed)  09/13/2023   Medicare Annual Wellness (AWV)  01/16/2024   MAMMOGRAM  01/19/2024   DTaP/Tdap/Td (2 - Td or Tdap) 04/22/2025   Pneumonia Vaccine 50+ Years old  Completed   DEXA SCAN  Completed   Hepatitis C Screening  Completed   HPV VACCINES  Aged Out    Health Maintenance  Health Maintenance Due  Topic Date Due   Zoster Vaccines- Shingrix (1 of 2) Never done   COVID-19 Vaccine (4 - 2023-24 season) 11/08/2022  Colorectal cancer screening: Type of screening: Colonoscopy. Completed 09/12/20. Repeat every 3 years  Mammogram status: Ordered today. Pt provided with contact info and advised to call to schedule appt.   Bone Density status: Completed 07/13/18. Results reflect: Bone density results: OSTEOPOROSIS. Repeat every   years.  Lung Cancer Screening: (Low Dose CT Chest recommended if Age 35-80 years, 30 pack-year currently smoking OR have quit w/in 15years.) does not qualify.   Lung Cancer Screening Referral: n/a  Additional Screening:  Hepatitis C Screening: does qualify; Completed 08/14/17  Vision Screening: Recommended annual ophthalmology exams for early detection of glaucoma and other disorders of the eye. Is the patient up to date with their annual eye exam?  No  Who is the provider or what is the name of the office in which the patient attends annual eye exams? none If pt is not established with a provider, would they like to be referred to a provider to establish care? No .   Dental Screening: Recommended annual dental exams for proper oral hygiene  Community Resource Referral / Chronic Care Management: CRR  required this visit?  No   CCM required this visit?  No      Plan:     I have personally reviewed and noted the following in the patient's chart:   Medical and social history Use of alcohol, tobacco or illicit drugs  Current medications and supplements including opioid prescriptions. Patient is currently taking opioid prescriptions. Information provided to patient regarding non-opioid alternatives. Patient advised to discuss non-opioid treatment plan with their provider. Functional ability and status Nutritional status Physical activity Advanced directives List of other physicians Hospitalizations, surgeries, and ER visits in previous 12 months Vitals Screenings to include cognitive, depression, and falls Referrals and appointments  In addition, I have reviewed and discussed with patient certain preventive protocols, quality metrics, and best practice recommendations. A written personalized care plan for preventive services as well as general preventive health recommendations were provided to patient.     Kandis Fantasia [place], [place]   1/61/0960   Nurse Notes: No concerns

## 2023-01-20 ENCOUNTER — Other Ambulatory Visit: Payer: Self-pay | Admitting: Family Medicine

## 2023-01-21 NOTE — Telephone Encounter (Signed)
Requested medication (s) are due for refill today - no  Requested medication (s) are on the active medication list -no  Future visit scheduled -yes  Last refill: unsure  Notes to clinic: medication no longer listed on current medication list- non delegated Rx  Requested Prescriptions  Pending Prescriptions Disp Refills   triamcinolone cream (KENALOG) 0.1 % [Pharmacy Med Name: TRIAMCINOLONE 0.1% CREAM] 30 g 0    Sig: APPLY TO AFFECTED AREA TWICE A DAY     Not Delegated - Dermatology:  Corticosteroids Failed - 01/20/2023 12:32 PM      Failed - This refill cannot be delegated      Failed - Valid encounter within last 12 months    Recent Outpatient Visits           1 year ago General medical exam   [facility] Donita Brooks, MD   1 year ago Other infective acute otitis externa of right ear   [facility] Tanya Nones, Priscille Heidelberg, MD   1 year ago Acute bacterial rhinosinusitis   [facility] Medicine Tanya Nones, Priscille Heidelberg, MD   2 years ago Encounter for annual wellness exam in Medicare patient   [facility] Valentino Nose, NP   2 years ago Chronic pain of right knee   [facility] Pickard, Priscille Heidelberg, MD       Future Appointments             In 1 month Roda Shutters, Edwin Cap, MD [facility] [place]   In 7 months Pickard, Priscille Heidelberg, MD [facility], [facility], [place]               Requested Prescriptions  Pending Prescriptions Disp Refills   triamcinolone cream (KENALOG) 0.1 % [Pharmacy Med Name: TRIAMCINOLONE 0.1% CREAM] 30 g 0    Sig: APPLY TO AFFECTED AREA TWICE A DAY     Not Delegated - Dermatology:  Corticosteroids Failed - 01/20/2023 12:32 PM      Failed - This refill cannot be delegated      Failed - Valid encounter within last 12 months    Recent Outpatient Visits           1 year ago General medical exam   [facility] Donita Brooks, MD   1 year  ago Other infective acute otitis externa of right ear   [facility] Medicine Donita Brooks, MD   1 year ago Acute bacterial rhinosinusitis   [facility] Medicine Tanya Nones, Priscille Heidelberg, MD   2 years ago Encounter for annual wellness exam in Medicare patient   [facility] Medicine Valentino Nose, NP   2 years ago Chronic pain of right knee   [facility] Medicine Pickard, Priscille Heidelberg, MD       Future Appointments             In 1 month Roda Shutters, Edwin Cap, MD [facility] [place]   In 7 months Pickard, Priscille Heidelberg, MD [facility], [facility]

## 2023-01-22 ENCOUNTER — Ambulatory Visit
Admission: RE | Admit: 2023-01-22 | Discharge: 2023-01-22 | Disposition: A | Discharge status: Home / Self Care | Payer: 59 | Source: Ambulatory Visit | Attending: Family Medicine | Admitting: Family Medicine

## 2023-01-22 DIAGNOSIS — Z1231 Encounter for screening mammogram for malignant neoplasm of breast: Secondary | ICD-10-CM | POA: Diagnosis not present

## 2023-02-10 ENCOUNTER — Other Ambulatory Visit: Payer: Self-pay | Admitting: Family Medicine

## 2023-02-11 NOTE — Telephone Encounter (Signed)
Requested Prescriptions  Pending Prescriptions Disp Refills   atorvastatin (LIPITOR) 40 MG tablet [Pharmacy Med Name: ATORVASTATIN 40 MG TABLET] 90 tablet 1    Sig: TAKE 1 TABLET BY MOUTH EVERY DAY     Cardiovascular:  Antilipid - Statins Failed - 02/10/2023  2:38 AM      Failed - Valid encounter within last 12 months    Recent Outpatient Visits           1 year ago General medical exam   [facility] Donita Brooks, MD   1 year ago Other infective acute otitis externa of right ear   [facility] Medicine Donita Brooks, MD   1 year ago Acute bacterial rhinosinusitis   [facility] Medicine Tanya Nones, Priscille Heidelberg, MD   2 years ago Encounter for annual wellness exam in Medicare patient   [facility] Medicine Valentino Nose, NP   2 years ago Chronic pain of right knee   [facility] Medicine Pickard, Priscille Heidelberg, MD       Future Appointments             In 1 week Tarry Kos, MD [facility]   In 6 months Pickard, Priscille Heidelberg, MD [facility], [facility]            Failed - Lipid Panel in normal range within the last 12 months    Cholesterol, Total  Date Value Ref Range Status  09/06/2020 186 100 - 199 mg/dL Final   Cholesterol  Date Value Ref Range Status  07/12/2022 171 <200 mg/dL Final   LDL Cholesterol (Calc)  Date Value Ref Range Status  07/12/2022 73 mg/dL (calc) Final    Comment:    Reference range: <100 . Desirable range <100 mg/dL for primary prevention;   <70 mg/dL for patients with CHD or diabetic patients  with > or = 2 CHD risk factors. Marland Kitchen LDL-C is now calculated using the Martin-Hopkins  calculation, which is a validated novel method providing  better accuracy than the Friedewald equation in the  estimation of LDL-C.  Horald Pollen et al. Lenox Ahr. 1610;960(45): 2061-2068  (http://education.[employer].com/faq/FAQ164)    HDL  Date Value Ref Range Status   07/12/2022 85 > OR = 50 mg/dL Final  40/98/1191 89 >47 mg/dL Final   Triglycerides  Date Value Ref Range Status  07/12/2022 54 <150 mg/dL Final         Passed - Patient is not pregnant

## 2023-02-13 ENCOUNTER — Other Ambulatory Visit: Payer: Self-pay | Admitting: Family Medicine

## 2023-02-13 NOTE — Telephone Encounter (Signed)
Requested by interface surescripts. Medication discontinued 12/06/22. Requested Prescriptions  Refused Prescriptions Disp Refills   omeprazole (PRILOSEC) 20 MG capsule [Pharmacy Med Name: OMEPRAZOLE DR 20 MG CAPSULE] 90 capsule 3    Sig: TAKE 1 CAPSULE BY MOUTH EVERY DAY     Gastroenterology: Proton Pump Inhibitors Failed - 02/13/2023  2:25 PM      Failed - Valid encounter within last 12 months    Recent Outpatient Visits           1 year ago General medical exam   [facility] Medicine Donita Brooks, MD   1 year ago Other infective acute otitis externa of right ear   [facility] Medicine Donita Brooks, MD   1 year ago Acute bacterial rhinosinusitis   [facility] Medicine Tanya Nones Priscille Heidelberg, MD   2 years ago Encounter for annual wellness exam in Medicare patient   [facility] Medicine Valentino Nose, NP   2 years ago Chronic pain of right knee   [facility] Medicine Pickard, Priscille Heidelberg, MD       Future Appointments             In 1 week Tarry Kos, MD [facility] [place]   In 6 months Pickard, Priscille Heidelberg, MD [facility], [facility]

## 2023-02-19 NOTE — Progress Notes (Unsigned)
Office Visit Note   Patient: Erin Wong           Date of Birth: 1951/08/25           MRN: [id number] Visit Date: 02/20/2023              Requested by: Donita Brooks, MD [address] [facility] [address] [place],  [place] [address] PCP: Donita Brooks, MD   Assessment & Plan: Visit Diagnoses:  1. 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  She has been doing very well and very pleased.  She had a fall couple months ago in a field but has been able to do all of her activities without any problems.  At this point patient can stop dental prophylaxis.  Activity as tolerated.  She can follow-up with [place] every few years for x-rays if she chooses.  Follow-Up Instructions: No follow-ups on file.   Orders:  Orders Placed This Encounter  Procedures   XR Knee 1-2 Views Right   No orders of the defined types were placed in this encounter.     Procedures: No procedures performed   Clinical Data: No additional findings.   Subjective: Chief Complaint  Patient presents with   Right Knee - Follow-up    HPI Km is here today for 2-year postop visit status post right total knee replacement.  She has been doing very well and has been very pleased.  Recently had a fall onto her knee about 2 months ago but she has been able to weight-bear and walk without any problems. Review of Systems   Objective: Vital Signs: There were no vitals taken for this visit.  Physical Exam  Ortho Exam Examination right knee shows fully healed surgical scar.  Collaterals are stable.  Excellent range of motion. Specialty Comments:  No specialty comments available.  Imaging: XR Knee 1-2 Views Right  Result Date: 02/20/2023 X-rays demonstrate stable total knee replacement with stemmed femoral and tibial components without any evidence of subsidence or loosening.    PMFS History: Patient Active Problem  List   Diagnosis Date Noted   Status post total right knee replacement 02/19/2021   Post-traumatic osteoarthritis of right knee 12/04/2020   Positive colorectal cancer screening using Cologuard test 07/02/2020   History of colectomy 07/02/2020   Colon cancer screening 07/02/2020   CHF (congestive heart failure) (HCC)    Acute respiratory failure with hypoxia (HCC) 02/12/2020   Acute bronchitis 02/12/2020   Acute systolic heart failure (HCC) 01/19/2020   Atrial fibrillation with rapid ventricular response (HCC) 01/13/2020   CKD (chronic kidney disease) stage 3, GFR 30-59 ml/min (HCC)    Benign essential HTN    HLD (hyperlipidemia)    Hyperlipidemia 02/12/2017   AKI (acute kidney injury) (HCC)    Chest pain 01/13/2017   Chest pressure    Acute kidney injury superimposed on CKD (HCC)    Osteoporosis 10/18/2015   Vitamin D deficiency 07/20/2015   HTN (hypertension) 07/17/2015   GERD (gastroesophageal reflux disease) 01/12/2015   Chronic constipation 01/12/2015   History of colon surgery 01/12/2015   History of total hysterectomy with bilateral salpingo-oophorectomy (BSO) 01/12/2015   Allergy    Asthma    Past Medical History:  Diagnosis Date   Allergy    Arthritis    Asthma    Atrial fibrillation (HCC)    Benign essential HTN  CHF (congestive heart failure) (HCC)    CKD (chronic kidney disease) stage 3, GFR 30-59 ml/min (HCC)    Dysrhythmia     Afib -  had Ablation   GERD (gastroesophageal reflux disease)    HLD (hyperlipidemia)    HTN (hypertension)    Pneumonia     Family History  Problem Relation Age of Onset   Arthritis Mother    Miscarriages / [place] Mother    Lung cancer Mother        Had quit smoking for 25 years   Esophageal cancer Mother    Alcohol abuse Father    Alcohol abuse Brother    Kidney cancer Brother    Breast cancer Sister    COPD Brother    Asthma Maternal Aunt    Diabetes Maternal Aunt    Colon cancer Neg Hx    Inflammatory bowel  disease Neg Hx    Liver disease Neg Hx    Pancreatic cancer Neg Hx    Rectal cancer Neg Hx    Stomach cancer Neg Hx     Past Surgical History:  Procedure Laterality Date   ABDOMINAL HYSTERECTOMY  10/04/1995   Hysterectomy and Bilateral oophorectomy   CARDIOVERSION N/A 01/17/2020   Procedure: CARDIOVERSION;  Surgeon: Wendall Stade, MD;  Location[facility] END[facility]COPY;  Service: Cardiovascular;  Laterality: N/A;   COLON SURGERY  09/2009   hole in colon   FRACTURE SURGERY Right 08/02/2011   Right Leg--Plates & Screws   HAND SURGERY Right 09/02/2006   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--Arthritis   HARDWARE REMOVAL Right 12/04/2020   Procedure: rigth knee hardware removal;  Surgeon: Tarry Kos, MD;  Location: [facility] OR;  Service: Orthopedics;  Laterality: Right;   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   at time of colon surgery   TEE WITHOUT CARDIOVERSION N/A 01/17/2020   Procedure: TRANSESOPHAGEAL ECHOCARDIOGRAM (TEE);  Surgeon: Wendall Stade, MD;  Location[facility] END[facility]COPY;  Service: Cardiovascular;  Laterality: N/A;   TOTAL KNEE ARTHROPLASTY Right 02/19/2021   Procedure: RIGHT TOTAL KNEE ARTHROPLASTY;  Surgeon: Tarry Kos, MD;  Location: [facility] OR;  Service: Orthopedics;  Laterality: Right;   Social History   Occupational History   Occupation: retired  Tobacco Use   Smoking status: Former    Types: Cigarettes    Quit date: 01/10/1991    Years since quitting: 32.1   Smokeless tobacco: Never  Vaping Use   Vaping Use: Never used  Substance and Sexual Activity   Alcohol use: Not Currently    Comment: quit in May/2021   Drug use: No   Sexual activity: Never

## 2023-02-20 ENCOUNTER — Ambulatory Visit (INDEPENDENT_AMBULATORY_CARE_PROVIDER_SITE_OTHER): Payer: 59 | Admitting: Orthopaedic Surgery

## 2023-02-20 ENCOUNTER — Other Ambulatory Visit (INDEPENDENT_AMBULATORY_CARE_PROVIDER_SITE_OTHER): Payer: 59

## 2023-02-20 DIAGNOSIS — Z96651 Presence of right artificial knee joint: Secondary | ICD-10-CM

## 2023-03-21 DIAGNOSIS — H9311 Tinnitus, right ear: Secondary | ICD-10-CM | POA: Diagnosis not present

## 2023-03-21 DIAGNOSIS — H903 Sensorineural hearing loss, bilateral: Secondary | ICD-10-CM | POA: Diagnosis not present

## 2023-04-11 ENCOUNTER — Other Ambulatory Visit: Payer: Self-pay | Admitting: Family Medicine

## 2023-04-14 ENCOUNTER — Other Ambulatory Visit: Payer: Self-pay | Admitting: Family Medicine

## 2023-04-14 ENCOUNTER — Other Ambulatory Visit: Payer: Self-pay | Admitting: Physician Assistant

## 2023-04-14 ENCOUNTER — Telehealth: Payer: Self-pay

## 2023-04-14 MED ORDER — TRAMADOL HCL 50 MG PO TABS: 100.0000 mg | ORAL_TABLET | Freq: Four times a day (QID) | ORAL | 3 refills | Status: DC | PRN

## 2023-04-14 NOTE — Telephone Encounter (Signed)
Prescription Request  04/14/2023  LOV: 12/20/2022  What is the name of the medication or equipment? apixaban (ELIQUIS) 5 MG TABS tablet   Have you contacted your pharmacy to request a refill? Yes   Which pharmacy would you like this sent to?  [id number] [place], [place] - [address] [place] [address] Phone: [phone] Fax: [phone]    Patient notified that their request is being sent to the clinical staff for review and that they should receive a response within 2 business days.   Please advise at [facility] [phone]

## 2023-04-14 NOTE — Telephone Encounter (Signed)
Pt's husband came by stating pt is in need of a refill on pt's Tramadol. Last RF was 12/10/2022. Last OV 12/20/2022.  [facility] [address] Thank you.

## 2023-04-14 NOTE — Telephone Encounter (Signed)
Requested medication (s) are due for refill today: Yes  Requested medication (s) are on the active medication list: Yes  Last refill:  12/10/22  Future visit scheduled: Yes  Notes to clinic:  Unable to refill per protocol, cannot delegate.      Requested Prescriptions  Pending Prescriptions Disp Refills   traMADol (ULTRAM) 50 MG tablet [Pharmacy Med Name: TRAMADOL HCL 50 MG TABLET] 180 tablet     Sig: TAKE 2 TABLETS BY MOUTH EVERY 6 HOURS AS NEEDED.     Not Delegated - Analgesics:  Opioid Agonists Failed - 04/11/2023 11:13 AM      Failed - This refill cannot be delegated      Failed - Urine Drug Screen completed in last 360 days      Failed - Valid encounter within last 3 months    Recent Outpatient Visits           1 year ago General medical exam   [facility] Medicine Donita Brooks, MD   1 year ago Other infective acute otitis externa of right ear   [facility] Medicine Donita Brooks, MD   1 year ago Acute bacterial rhinosinusitis   [facility] Tanya Nones Priscille Heidelberg, MD   2 years ago Encounter for annual wellness exam in Medicare patient   [facility] Medicine Valentino Nose, NP   2 years ago Chronic pain of right knee   [facility] Family Medicine Pickard, Priscille Heidelberg, MD       Future Appointments             In 4 months Pickard, Priscille Heidelberg, MD [facility], [facility]

## 2023-04-15 NOTE — Telephone Encounter (Signed)
Requested medications are due for refill today.  no  Requested medications are on the active medications list.  no  Last refill.   Future visit scheduled.   yes  Notes to clinic.  Refill/refusal are not delegated.    Requested Prescriptions  Pending Prescriptions Disp Refills   triamcinolone cream (KENALOG) 0.1 % [Pharmacy Med Name: TRIAMCINOLONE 0.1% CREAM] 30 g 0    Sig: APPLY TO AFFECTED AREA TWICE A DAY     Not Delegated - Dermatology:  Corticosteroids Failed - 04/14/2023  8:12 AM      Failed - This refill cannot be delegated      Failed - Valid encounter within last 12 months    Recent Outpatient Visits           1 year ago General medical exam   [facility] Donita Brooks, MD   1 year ago Other infective acute otitis externa of right ear   [facility] Medicine Donita Brooks, MD   1 year ago Acute bacterial rhinosinusitis   [facility] Tanya Nones, Priscille Heidelberg, MD   2 years ago Encounter for annual wellness exam in Medicare patient   [facility] Medicine Valentino Nose, NP   2 years ago Chronic pain of right knee   [facility] Medicine Pickard, Priscille Heidelberg, MD       Future Appointments             In 4 months Pickard, Priscille Heidelberg, MD [facility], [facility]            Signed Prescriptions Disp Refills   fluticasone-salmeterol (WIXELA INHUB) 100-50 MCG/ACT AEPB 180 each 0    Sig: INHALE 1 PUFF INTO THE LUNGS TWICE A DAY     Pulmonology:  Combination Products Failed - 04/14/2023  8:12 AM      Failed - Valid encounter within last 12 months    Recent Outpatient Visits           1 year ago General medical exam   [facility] Donita Brooks, MD   1 year ago Other infective acute otitis externa of right ear   [facility] Family Medicine Tanya Nones Priscille Heidelberg, MD   1 year ago Acute bacterial rhinosinusitis   [facility] Tanya Nones, Priscille Heidelberg, MD   2  years ago Encounter for annual wellness exam in Medicare patient   [facility] Family Medicine Valentino Nose, NP   2 years ago Chronic pain of right knee   [facility] Medicine Pickard, Priscille Heidelberg, MD       Future Appointments             In 4 months Pickard, Priscille Heidelberg, MD [facility], [facility]            Refused Prescriptions Disp Refills   omeprazole (PRILOSEC) 20 MG capsule [Pharmacy Med Name: OMEPRAZOLE DR 20 MG CAPSULE] 90 capsule 3    Sig: TAKE 1 CAPSULE BY MOUTH EVERY DAY     Gastroenterology: Proton Pump Inhibitors Failed - 04/14/2023  8:12 AM      Failed - Valid encounter within last 12 months    Recent Outpatient Visits           1 year ago General medical exam   [facility] Medicine Donita Brooks, MD   1 year ago Other infective acute otitis externa of right  ear   [facility] Medicine Pickard, Priscille Heidelberg, MD   1 year ago Acute bacterial rhinosinusitis   [facility] Family Medicine Tanya Nones, Priscille Heidelberg, MD   2 years ago Encounter for annual wellness exam in Medicare patient   [facility] Medicine Valentino Nose, NP   2 years ago Chronic pain of right knee   [facility] Family Medicine Pickard, Priscille Heidelberg, MD       Future Appointments             In 4 months Pickard, Priscille Heidelberg, MD [facility], [facility]

## 2023-04-15 NOTE — Telephone Encounter (Signed)
Requested Prescriptions  Pending Prescriptions Disp Refills   WIXELA INHUB 100-50 MCG/ACT AEPB [Pharmacy Med Name: WIXELA 100-50 INHUB] 180 each 1    Sig: INHALE 1 PUFF INTO THE LUNGS TWICE A DAY     Pulmonology:  Combination Products Failed - 04/14/2023  8:12 AM      Failed - Valid encounter within last 12 months    Recent Outpatient Visits           1 year ago General medical exam   [facility] Donita Brooks, MD   1 year ago Other infective acute otitis externa of right ear   [facility] Medicine Donita Brooks, MD   1 year ago Acute bacterial rhinosinusitis   [facility] Medicine Tanya Nones, Priscille Heidelberg, MD   2 years ago Encounter for annual wellness exam in Medicare patient   [facility] Family Medicine Valentino Nose, NP   2 years ago Chronic pain of right knee   [facility] Medicine Pickard, Priscille Heidelberg, MD       Future Appointments             In 4 months Pickard, Priscille Heidelberg, MD [facility], [facility]             triamcinolone cream (KENALOG) 0.1 % [Pharmacy Med Name: TRIAMCINOLONE 0.1% CREAM] 30 g 0    Sig: APPLY TO AFFECTED AREA TWICE A DAY     Not Delegated - Dermatology:  Corticosteroids Failed - 04/14/2023  8:12 AM      Failed - This refill cannot be delegated      Failed - Valid encounter within last 12 months    Recent Outpatient Visits           1 year ago General medical exam   [facility] Medicine Donita Brooks, MD   1 year ago Other infective acute otitis externa of right ear   [facility] Family Medicine Tanya Nones Priscille Heidelberg, MD   1 year ago Acute bacterial rhinosinusitis   [facility] Medicine Tanya Nones, Priscille Heidelberg, MD   2 years ago Encounter for annual wellness exam in Medicare patient   [facility] Family Medicine Valentino Nose, NP   2 years ago Chronic pain of right knee   [facility] Medicine Pickard, Priscille Heidelberg, MD       Future Appointments              In 4 months Pickard, Priscille Heidelberg, MD [facility] Family [facility], [facility]            Refused Prescriptions Disp Refills   omeprazole (PRILOSEC) 20 MG capsule [Pharmacy Med Name: OMEPRAZOLE DR 20 MG CAPSULE] 90 capsule 3    Sig: TAKE 1 CAPSULE BY MOUTH EVERY DAY     Gastroenterology: Proton Pump Inhibitors Failed - 04/14/2023  8:12 AM      Failed - Valid encounter within last 12 months    Recent Outpatient Visits           1 year ago General medical exam   [facility] Family Medicine Donita Brooks, MD   1 year ago Other infective acute otitis externa of right ear   [facility] Donita Brooks, MD   1 year ago Acute bacterial rhinosinusitis   [facility] Pickard, Priscille Heidelberg, MD   2 years ago Encounter for annual wellness exam in Medicare patient  [facility] Family Medicine Valentino Nose, NP   2 years ago Chronic pain of right knee   [facility] Family Medicine Pickard, Priscille Heidelberg, MD       Future Appointments             In 4 months Pickard, Priscille Heidelberg, MD [facility], [facility]

## 2023-04-16 MED ORDER — APIXABAN 5 MG PO TABS: 5.0000 mg | ORAL_TABLET | Freq: Two times a day (BID) | ORAL | 1 refills | Status: DC

## 2023-04-16 NOTE — Telephone Encounter (Signed)
Last OV 12/20/22 Requested Prescriptions  Pending Prescriptions Disp Refills   apixaban (ELIQUIS) 5 MG TABS tablet 60 tablet 11    Sig: Take 1 tablet (5 mg total) by mouth 2 (two) times daily.     Hematology:  Anticoagulants - apixaban Failed - 04/14/2023  4:05 PM      Failed - PLT in normal range and within 360 days    Platelets  Date Value Ref Range Status  07/12/2022 417 (H) 140 - 400 Thousand/uL Final  09/06/2020 415 150 - 450 x10E3/uL Final         Failed - Cr in normal range and within 360 days    Creat  Date Value Ref Range Status  07/12/2022 1.11 (H) 0.60 - 1.00 mg/dL Final         Failed - Valid encounter within last 12 months    Recent Outpatient Visits           1 year ago General medical exam   [facility] Donita Brooks, MD   1 year ago Other infective acute otitis externa of right ear   [facility] Donita Brooks, MD   1 year ago Acute bacterial rhinosinusitis   [facility] Donita Brooks, MD   2 years ago Encounter for annual wellness exam in Medicare patient   [facility] Valentino Nose, NP   2 years ago Chronic pain of right knee   [facility] Pickard, Priscille Heidelberg, MD       Future Appointments             In 4 months Pickard, Priscille Heidelberg, MD [facility], [facility]            Passed - HGB in normal range and within 360 days    Hemoglobin  Date Value Ref Range Status  07/12/2022 12.4 11.7 - 15.5 g/dL Final  29/56/2130 86.5 11.1 - 15.9 g/dL Final         Passed - HCT in normal range and within 360 days    HCT  Date Value Ref Range Status  07/12/2022 36.7 35.0 - 45.0 % Final   Hematocrit  Date Value Ref Range Status  09/06/2020 38.0 34.0 - 46.6 % Final         Passed - AST in normal range and within 360 days    AST  Date Value Ref Range Status  07/12/2022 15 10 - 35 U/L Final         Passed - ALT in normal range and within  360 days    ALT  Date Value Ref Range Status  07/12/2022 16 6 - 29 U/L Final

## 2023-05-07 ENCOUNTER — Other Ambulatory Visit: Payer: Self-pay | Admitting: Cardiovascular Disease

## 2023-05-09 ENCOUNTER — Other Ambulatory Visit: Payer: Self-pay | Admitting: Family Medicine

## 2023-05-09 ENCOUNTER — Other Ambulatory Visit: Payer: Self-pay | Admitting: Cardiovascular Disease

## 2023-05-12 NOTE — Telephone Encounter (Signed)
Unable to refill per protocol, Rx expired. Discontinued 12/06/22.  Requested Prescriptions  Pending Prescriptions Disp Refills   triamcinolone cream (KENALOG) 0.1 % [Pharmacy Med Name: TRIAMCINOLONE 0.1% CREAM] 30 g 0    Sig: APPLY TO AFFECTED AREA TWICE A DAY     Not Delegated - Dermatology:  Corticosteroids Failed - 05/09/2023  5:23 PM      Failed - This refill cannot be delegated      Failed - Valid encounter within last 12 months    Recent Outpatient Visits           1 year ago General medical exam   [facility] Donita Brooks, MD   1 year ago Other infective acute otitis externa of right ear   [facility] Donita Brooks, MD   1 year ago Acute bacterial rhinosinusitis   [facility] Tanya Nones, Priscille Heidelberg, MD   2 years ago Encounter for annual wellness exam in Medicare patient   [facility] Valentino Nose, NP   2 years ago Chronic pain of right knee   [facility] Pickard, Priscille Heidelberg, MD       Future Appointments             In 3 months Pickard, Priscille Heidelberg, MD  [facility], [facility]             omeprazole (PRILOSEC) 20 MG capsule [Pharmacy Med Name: OMEPRAZOLE DR 20 MG CAPSULE] 90 capsule 3    Sig: TAKE 1 CAPSULE BY MOUTH EVERY DAY     Gastroenterology: Proton Pump Inhibitors Failed - 05/09/2023  5:23 PM      Failed - Valid encounter within last 12 months    Recent Outpatient Visits           1 year ago General medical exam   [facility] Donita Brooks, MD   1 year ago Other infective acute otitis externa of right ear   [facility] Donita Brooks, MD   1 year ago Acute bacterial rhinosinusitis   [facility] Tanya Nones, Priscille Heidelberg, MD   2 years ago Encounter for annual wellness exam in Medicare patient   [facility] Valentino Nose, NP   2 years ago Chronic pain of right knee   [facility] Pickard, Priscille Heidelberg, MD       Future Appointments             In 3 months Pickard, Priscille Heidelberg, MD [facility], [facility]

## 2023-07-01 ENCOUNTER — Other Ambulatory Visit: Payer: Self-pay | Admitting: Cardiovascular Disease

## 2023-07-10 ENCOUNTER — Other Ambulatory Visit: Payer: Self-pay | Admitting: Cardiovascular Disease

## 2023-07-23 ENCOUNTER — Other Ambulatory Visit: Payer: Self-pay | Admitting: Cardiovascular Disease

## 2023-07-23 ENCOUNTER — Telehealth: Payer: Self-pay

## 2023-07-23 NOTE — Telephone Encounter (Signed)
Called patient to let her know that the handicap application is ready for pick up.

## 2023-08-03 ENCOUNTER — Other Ambulatory Visit: Payer: Self-pay | Admitting: Family Medicine

## 2023-08-05 ENCOUNTER — Other Ambulatory Visit: Payer: Self-pay | Admitting: Cardiovascular Disease

## 2023-08-06 NOTE — Telephone Encounter (Signed)
Requested medication (s) are due for refill today -yes  Requested medication (s) are on the active medication list -yes  Future visit scheduled -yes  Last refill: 02/11/23 #90 1RF  Notes to clinic: fails lab protocol- over 1 year- 07/12/22  Requested Prescriptions  Pending Prescriptions Disp Refills   atorvastatin (LIPITOR) 40 MG tablet [Pharmacy Med Name: ATORVASTATIN 40 MG TABLET] 90 tablet 1    Sig: TAKE 1 TABLET BY MOUTH EVERY DAY     Cardiovascular:  Antilipid - Statins Failed - 08/03/2023  9:01 AM      Failed - Valid encounter within last 12 months    Recent Outpatient Visits           1 year ago General medical exam   [facility] Donita Brooks, MD   1 year ago Other infective acute otitis externa of right ear   [facility] Donita Brooks, MD   2 years ago Acute bacterial rhinosinusitis   [facility] Tanya Nones, Priscille Heidelberg, MD   2 years ago Encounter for annual wellness exam in Medicare patient   [facility] Valentino Nose, NP   2 years ago Chronic pain of right knee   [facility] Pickard, Priscille Heidelberg, MD       Future Appointments             In 3 weeks Tanya Nones, Priscille Heidelberg, MD [facility]            Failed - Lipid Panel in normal range within the last 12 months    Cholesterol, Total  Date Value Ref Range Status  09/06/2020 186 100 - 199 mg/dL Final   Cholesterol  Date Value Ref Range Status  07/12/2022 171 <200 mg/dL Final   LDL Cholesterol (Calc)  Date Value Ref Range Status  07/12/2022 73 mg/dL (calc) Final    Comment:    Reference range: <100 . Desirable range <100 mg/dL for primary prevention;   <70 mg/dL for patients with CHD or diabetic patients  with > or = 2 CHD risk factors. Marland Kitchen LDL-C is now calculated using the Martin-Hopkins  calculation, which is a validated novel method providing  better accuracy than the Friedewald  equation in the  estimation of LDL-C.  Horald Pollen et al. Lenox Ahr. 2725;366(44): 2061-2068  (http://education.[facility].com/faq/FAQ164)    HDL  Date Value Ref Range Status  07/12/2022 85 > OR = 50 mg/dL Final  03/47/4259 89 >56 mg/dL Final   Triglycerides  Date Value Ref Range Status  07/12/2022 54 <150 mg/dL Final         Passed - Patient is not pregnant         Requested Prescriptions  Pending Prescriptions Disp Refills   atorvastatin (LIPITOR) 40 MG tablet [Pharmacy Med Name: ATORVASTATIN 40 MG TABLET] 90 tablet 1    Sig: TAKE 1 TABLET BY MOUTH EVERY DAY     Cardiovascular:  Antilipid - Statins Failed - 08/03/2023  9:01 AM      Failed - Valid encounter within last 12 months    Recent Outpatient Visits           1 year ago General medical exam   [facility] Donita Brooks, MD   1 year ago Other infective acute otitis externa of right ear   [facility] Donita Brooks, MD   2 years ago Acute bacterial rhinosinusitis   [facility] Pickard, Priscille Heidelberg, MD   2 years ago Encounter for annual wellness exam in Medicare patient   [facility] Valentino Nose, NP   2 years ago Chronic pain of right knee   [facility] Pickard, Priscille Heidelberg, MD       Future Appointments             In 3 weeks Tanya Nones, Priscille Heidelberg, MD [facility], [facility]            Failed - Lipid Panel in normal range within the last 12 months    Cholesterol, Total  Date Value Ref Range Status  09/06/2020 186 100 - 199 mg/dL Final   Cholesterol  Date Value Ref Range Status  07/12/2022 171 <200 mg/dL Final   LDL Cholesterol (Calc)  Date Value Ref Range Status  07/12/2022 73 mg/dL (calc) Final    Comment:    Reference range: <100 . Desirable range <100 mg/dL for primary prevention;   <70 mg/dL for patients with CHD or diabetic patients  with > or = 2 CHD risk factors. Marland Kitchen LDL-C is  now calculated using the Martin-Hopkins  calculation, which is a validated novel method providing  better accuracy than the Friedewald equation in the  estimation of LDL-C.  Horald Pollen et al. Lenox Ahr. 4696;295(28): 2061-2068  (http://education.[facility].com/faq/FAQ164)    HDL  Date Value Ref Range Status  07/12/2022 85 > OR = 50 mg/dL Final  41/32/4401 89 >02 mg/dL Final   Triglycerides  Date Value Ref Range Status  07/12/2022 54 <150 mg/dL Final         Passed - Patient is not pregnant

## 2023-08-14 ENCOUNTER — Telehealth: Payer: Self-pay

## 2023-08-14 ENCOUNTER — Other Ambulatory Visit: Payer: Self-pay | Admitting: Family Medicine

## 2023-08-14 MED ORDER — TRAMADOL HCL 50 MG PO TABS: 100.0000 mg | ORAL_TABLET | Freq: Four times a day (QID) | ORAL | 3 refills | Status: DC | PRN

## 2023-08-14 NOTE — Telephone Encounter (Signed)
Pt came into office to request a refill of this med traMADol (ULTRAM) 50 MG tablet [[id number]].  LOV 12/20/22  PHARMACY: [id number] [place], [place] - [address] [place] [address] Phone: [phone]  Fax: [phone]    CB: [phone]

## 2023-08-21 DIAGNOSIS — L600 Ingrowing nail: Secondary | ICD-10-CM | POA: Diagnosis not present

## 2023-08-21 DIAGNOSIS — L84 Corns and callosities: Secondary | ICD-10-CM | POA: Diagnosis not present

## 2023-08-25 ENCOUNTER — Telehealth: Payer: Self-pay | Admitting: Cardiovascular Disease

## 2023-08-25 MED ORDER — FUROSEMIDE 40 MG PO TABS: 40.0000 mg | ORAL_TABLET | Freq: Every day | ORAL | 0 refills | Status: DC

## 2023-08-25 NOTE — Telephone Encounter (Signed)
 *  STAT* If patient is at the pharmacy, call can be transferred to refill team.   1. Which medications need to be refilled? (please list name of each medication and dose if known)   furosemide (LASIX) 40 MG tablet   2. Which pharmacy/location (including street and city if local pharmacy) is medication to be sent to?  [id number] [place], [place] - [address]    3. Do they need a 30 day or 90 day supply? 90 days  Patient is completely out of medication, has appt scheduled for 10/17/23 with [employer]

## 2023-09-02 ENCOUNTER — Encounter: Payer: Medicare Other | Admitting: Family Medicine

## 2023-09-03 ENCOUNTER — Other Ambulatory Visit: Payer: Self-pay | Admitting: Physician Assistant

## 2023-09-11 ENCOUNTER — Ambulatory Visit (INDEPENDENT_AMBULATORY_CARE_PROVIDER_SITE_OTHER): Payer: Medicare Other | Admitting: Family Medicine

## 2023-09-11 ENCOUNTER — Encounter: Payer: Self-pay | Admitting: Family Medicine

## 2023-09-11 VITALS — BP 136/72 | HR 62 | Temp 98.2°F | Ht 63.0 in | Wt 155.0 lb

## 2023-09-11 DIAGNOSIS — N183 Chronic kidney disease, stage 3 unspecified: Secondary | ICD-10-CM

## 2023-09-11 DIAGNOSIS — Z23 Encounter for immunization: Secondary | ICD-10-CM | POA: Diagnosis not present

## 2023-09-11 DIAGNOSIS — Z0000 Encounter for general adult medical examination without abnormal findings: Secondary | ICD-10-CM

## 2023-09-11 DIAGNOSIS — I4891 Unspecified atrial fibrillation: Secondary | ICD-10-CM

## 2023-09-11 DIAGNOSIS — Z0001 Encounter for general adult medical examination with abnormal findings: Secondary | ICD-10-CM | POA: Diagnosis not present

## 2023-09-11 DIAGNOSIS — Z1211 Encounter for screening for malignant neoplasm of colon: Secondary | ICD-10-CM

## 2023-09-11 DIAGNOSIS — E7800 Pure hypercholesterolemia, unspecified: Secondary | ICD-10-CM

## 2023-09-11 NOTE — Progress Notes (Signed)
 Subjective:    Patient ID: Erin Wong, female    DOB: July 10, 1951, 73 y.o.   MRN: [id number] Patient is a very pleasant 73 year old Caucasian female here today for complete physical exam.  Past medical history significant for congestive heart failure, atrial fibrillation, hypertension, chronic kidney disease.  However she is in normal sinus rhythm today.  She has a history of an ablation.  She denies any symptoms of congestive heart failure.  She denies any shortness of breath or chest pain.  She denies any falls.  Her last colonoscopy was in 2022.  They recommended a repeat colonoscopy in 3 years.  Therefore she is due for this now.  She had a mammogram in May that was normal.  Due to her age she does not require Pap smear.  Her blood pressure today is excellent.  She does continue to complain of tinnitus in both ears.  Otherwise, her only concern his age-related cognitive decline.  She does report some problems with her short-term memory.  She denies any depression. Past Medical History:  Diagnosis Date   Allergy    Arthritis    Asthma    Atrial fibrillation (HCC)    Benign essential HTN    CHF (congestive heart failure) (HCC)    CKD (chronic kidney disease) stage 3, GFR 30-59 ml/min (HCC)    Dysrhythmia     Afib -  had Ablation   GERD (gastroesophageal reflux disease)    HLD (hyperlipidemia)    HTN (hypertension)    Pneumonia    Past Surgical History:  Procedure Laterality Date   ABDOMINAL HYSTERECTOMY  10/04/1995   Hysterectomy and Bilateral oophorectomy   CARDIOVERSION N/A 01/17/2020   Procedure: CARDIOVERSION;  Surgeon: Delford Maude BROCKS, MD;  Location: [facility] ENDOSCOPY;  Service: Cardiovascular;  Laterality: N/A;   COLON SURGERY  09/2009   hole in colon   FRACTURE SURGERY Right 08/02/2011   Right Leg--Plates & Screws   HAND SURGERY Right 09/02/2006   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--Arthritis   HARDWARE REMOVAL Right 12/04/2020   Procedure: rigth knee hardware removal;  Surgeon:  Jerri Kay HERO, MD;  Location: [facility] OR;  Service: Orthopedics;  Laterality: Right;   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   at time of colon surgery   TEE WITHOUT CARDIOVERSION N/A 01/17/2020   Procedure: TRANSESOPHAGEAL ECHOCARDIOGRAM (TEE);  Surgeon: Delford Maude BROCKS, MD;  Location: [facility] ENDOSCOPY;  Service: Cardiovascular;  Laterality: N/A;   TOTAL KNEE ARTHROPLASTY Right 02/19/2021   Procedure: RIGHT TOTAL KNEE ARTHROPLASTY;  Surgeon: Jerri Kay HERO, MD;  Location: [facility] OR;  Service: Orthopedics;  Laterality: Right;   Current Outpatient Medications on File Prior to Visit  Medication Sig Dispense Refill   albuterol  (VENTOLIN  HFA) 108 (90 Base) MCG/ACT inhaler Inhale 1 puff into the lungs every 6 (six) hours as needed for wheezing or shortness of breath. 18 g 3   amiodarone  (PACERONE ) 200 MG tablet TAKE 1/2 TABLET BY MOUTH EVERY DAY. PLEASE SCHEDULE OVERDUE APPOINTMENT FOR FUTURE REFILLS. 7 tablet 0   apixaban  (ELIQUIS ) 5 MG TABS tablet Take 1 tablet (5 mg total) by mouth 2 (two) times daily. 180 tablet 1   atorvastatin  (LIPITOR) 40 MG tablet TAKE 1 TABLET BY MOUTH EVERY DAY 90 tablet 1   Biotin 5000 MCG TABS Take 5,000 mcg by mouth daily.     CALCIUM  GLUCONATE PO Take 600 mg by mouth daily.     carvedilol  (COREG ) 3.125 MG tablet TAKE 1 TABLET BY MOUTH TWICE A DAY 60 tablet  0   Cholecalciferol  25 MCG (1000 UT) tablet Take 1 tablet (1,000 Units total) by mouth daily. 90 tablet 1   fluticasone  (FLONASE ) 50 MCG/ACT nasal spray Place 2 sprays into both nostrils daily as needed for allergies. 16 g 3   fluticasone -salmeterol (WIXELA INHUB) 100-50 MCG/ACT AEPB INHALE 1 PUFF INTO THE LUNGS TWICE A DAY 180 each 0   furosemide  (LASIX ) 40 MG tablet Take 1 tablet (40 mg total) by mouth daily. 90 tablet 0   polyethylene glycol (MIRALAX  / GLYCOLAX ) 17 g packet Take 17 g by mouth daily.     traMADol  (ULTRAM ) 50 MG tablet Take 2 tablets (100 mg total) by mouth every 6 (six) hours as needed. 180 tablet 3   No current  facility-administered medications on file prior to visit.   No Known Allergies Social History   Socioeconomic History   Marital status: Married    Spouse name: Not on file   Number of children: 3   Years of education: Not on file   Highest education level: Not on file  Occupational History   Occupation: retired  Tobacco Use   Smoking status: Former    Current packs/day: 0.00    Types: Cigarettes    Quit date: 01/10/1991    Years since quitting: 32.6   Smokeless tobacco: Never  Vaping Use   Vaping status: Never Used  Substance and Sexual Activity   Alcohol use: Not Currently    Comment: quit in May/2021   Drug use: No   Sexual activity: Never  Other Topics Concern   Not on file  Social History Narrative   Not on file   Social Drivers of Health   Financial Resource Strain: Low Risk  (01/16/2023)   Overall Financial Resource Strain (CARDIA)    Difficulty of Paying Living Expenses: Not hard at all  Food Insecurity: No Food Insecurity (01/16/2023)   Hunger Vital Sign    Worried About Running Out of Food in the Last Year: Never true    Ran Out of Food in the Last Year: Never true  Transportation Needs: No Transportation Needs (01/16/2023)   PRAPARE - Administrator, Civil Service (Medical): No    Lack of Transportation (Non-Medical): No  Physical Activity: Insufficiently Active (01/16/2023)   Exercise Vital Sign    Days of Exercise per Week: 3 days    Minutes of Exercise per Session: 30 min  Stress: No Stress Concern Present (01/16/2023)   [employer] of Occupational Health - Occupational Stress Questionnaire    Feeling of Stress : Not at all  Social Connections: Moderately Integrated (01/16/2023)   Social Connection and Isolation Panel [NHANES]    Frequency of Communication with Friends and Family: More than three times a week    Frequency of Social Gatherings with Friends and Family: Three times a week    Attends Religious Services: 1 to 4 times per year     Active Member of Clubs or Organizations: No    Attends Banker Meetings: Never    Marital Status: Married  Catering Manager Violence: Not At Risk (01/16/2023)   Humiliation, Afraid, Rape, and Kick questionnaire    Fear of Current or Ex-Partner: No    Emotionally Abused: No    Physically Abused: No    Sexually Abused: No     Review of Systems  All other systems reviewed and are negative.      Objective:   Physical Exam Vitals reviewed.  Constitutional:  General: She is not in acute distress.    Appearance: Normal appearance. She is well-developed and normal weight. She is not ill-appearing, toxic-appearing or diaphoretic.  HENT:     Head: Normocephalic and atraumatic.     Right Ear: Tympanic membrane and ear canal normal.     Left Ear: Tympanic membrane and ear canal normal.     Nose: Nose normal. No congestion or rhinorrhea.     Mouth/Throat:     Pharynx: Oropharynx is clear. No oropharyngeal exudate or posterior oropharyngeal erythema.  Eyes:     General: No scleral icterus.       Right eye: No discharge.        Left eye: No discharge.     Extraocular Movements: Extraocular movements intact.     Conjunctiva/sclera: Conjunctivae normal.     Pupils: Pupils are equal, round, and reactive to light.  Neck:     Vascular: No carotid bruit.  Cardiovascular:     Rate and Rhythm: Normal rate and regular rhythm.     Heart sounds: Normal heart sounds. No murmur heard.    No friction rub. No gallop.  Pulmonary:     Effort: Pulmonary effort is normal. No respiratory distress.     Breath sounds: Normal breath sounds. No stridor. No wheezing or rales.  Abdominal:     General: Bowel sounds are normal. There is no distension.     Palpations: Abdomen is soft. There is no mass.     Tenderness: There is no abdominal tenderness. There is no guarding or rebound.  Musculoskeletal:        General: Deformity present.     Cervical back: No rigidity.     Right knee:  Swelling present. Decreased range of motion. Tenderness present over the medial joint line and lateral joint line.     Right lower leg: No edema.     Left lower leg: No edema.  Lymphadenopathy:     Cervical: No cervical adenopathy.  Skin:    Findings: No lesion or rash.  Neurological:     General: No focal deficit present.     Mental Status: She is alert and oriented to person, place, and time. Mental status is at baseline.     Cranial Nerves: No cranial nerve deficit.     Sensory: No sensory deficit.     Motor: No weakness.     Coordination: Coordination normal.     Gait: Gait normal.  Psychiatric:        Mood and Affect: Mood normal.        Behavior: Behavior normal.        Thought Content: Thought content normal.        Judgment: Judgment normal.           Assessment & Plan:  Colon cancer screening - Plan: Ambulatory referral to Gastroenterology, CBC with Differential/Platelet, COMPLETE METABOLIC PANEL WITH GFR, Lipid panel, TSH  Pure hypercholesterolemia - Plan: CBC with Differential/Platelet, COMPLETE METABOLIC PANEL WITH GFR, Lipid panel, TSH  Atrial fibrillation, unspecified type (HCC) - Plan: CBC with Differential/Platelet, COMPLETE METABOLIC PANEL WITH GFR, Lipid panel, TSH  Stage 3 chronic kidney disease, unspecified whether stage 3a or 3b CKD (HCC) - Plan: CBC with Differential/Platelet, COMPLETE METABOLIC PANEL WITH GFR, Lipid panel, TSH  General medical exam Cancer screening is up-to-date except for her colonoscopy.  I will schedule her for this.  Patient received capvaxive/pneumonia vaccine.  I recommended the shingles shot but she declined this.  Her mammogram is  up-to-date.  I will check CBC CMP lipid panel and TSH.  Patient has diastolic dysfunction.  She would be a candidate for spironolactone versus transfer patient is a manage medication and at the present time she is asymptomatic regarding her congestive heart failure so I do not see a strong indication to  add these medicines.

## 2023-09-11 NOTE — Addendum Note (Signed)
 Addended by: Venia Carbon K on: 09/11/2023 12:02 PM   Modules accepted: Orders

## 2023-09-12 LAB — CBC WITH DIFFERENTIAL/PLATELET
Absolute Lymphocytes: 2906 {cells}/uL (ref 850–3900)
Absolute Monocytes: 1159 {cells}/uL — ABNORMAL HIGH (ref 200–950)
Basophils Absolute: 92 {cells}/uL (ref 0–200)
Basophils Relative: 1.1 %
Eosinophils Absolute: 319 {cells}/uL (ref 15–500)
Eosinophils Relative: 3.8 %
HCT: 39.6 % (ref 35.0–45.0)
Hemoglobin: 13.1 g/dL (ref 11.7–15.5)
MCH: 31.5 pg (ref 27.0–33.0)
MCHC: 33.1 g/dL (ref 32.0–36.0)
MCV: 95.2 fL (ref 80.0–100.0)
MPV: 10.8 fL (ref 7.5–12.5)
Monocytes Relative: 13.8 %
Neutro Abs: 3923 {cells}/uL (ref 1500–7800)
Neutrophils Relative %: 46.7 %
Platelets: 424 10*3/uL — ABNORMAL HIGH (ref 140–400)
RBC: 4.16 10*6/uL (ref 3.80–5.10)
RDW: 12.1 % (ref 11.0–15.0)
Total Lymphocyte: 34.6 %
WBC: 8.4 10*3/uL (ref 3.8–10.8)

## 2023-09-12 LAB — COMPLETE METABOLIC PANEL WITH GFR
AG Ratio: 1.5 (calc) (ref 1.0–2.5)
ALT: 16 U/L (ref 6–29)
AST: 17 U/L (ref 10–35)
Albumin: 4.3 g/dL (ref 3.6–5.1)
Alkaline phosphatase (APISO): 76 U/L (ref 37–153)
BUN/Creatinine Ratio: 13 (calc) (ref 6–22)
BUN: 15 mg/dL (ref 7–25)
CO2: 26 mmol/L (ref 20–32)
Calcium: 9.8 mg/dL (ref 8.6–10.4)
Chloride: 101 mmol/L (ref 98–110)
Creat: 1.15 mg/dL — ABNORMAL HIGH (ref 0.60–1.00)
Globulin: 2.8 g/dL (ref 1.9–3.7)
Glucose, Bld: 75 mg/dL (ref 65–99)
Potassium: 4.7 mmol/L (ref 3.5–5.3)
Sodium: 139 mmol/L (ref 135–146)
Total Bilirubin: 0.5 mg/dL (ref 0.2–1.2)
Total Protein: 7.1 g/dL (ref 6.1–8.1)
eGFR: 50 mL/min/{1.73_m2} — ABNORMAL LOW (ref >=60)

## 2023-09-12 LAB — LIPID PANEL
Cholesterol: 206 mg/dL — ABNORMAL HIGH (ref <200)
HDL: 96 mg/dL (ref >=50)
LDL Cholesterol (Calc): 96 mg/dL
Non-HDL Cholesterol (Calc): 110 mg/dL (ref <130)
Total CHOL/HDL Ratio: 2.1 (calc) (ref <5.0)
Triglycerides: 54 mg/dL (ref <150)

## 2023-09-12 LAB — TSH: TSH: 3.79 m[IU]/L (ref 0.40–4.50)

## 2023-09-15 ENCOUNTER — Other Ambulatory Visit: Payer: Self-pay | Admitting: Family Medicine

## 2023-09-15 NOTE — Telephone Encounter (Signed)
 Prescription Request  09/15/2023  LOV: 09/11/2023  What is the name of the medication or equipment?   fluticasone -salmeterol (WIXELA INHUB) 100-50 MCG/ACT AEPB   Have you contacted your pharmacy to request a refill? Yes   Which pharmacy would you like this sent to?  [id number] [place], [place] - [address] [place] [address] Phone: [phone] Fax: [phone]    Patient notified that their request is being sent to the clinical staff for review and that they should receive a response within 2 business days.   Please advise pharmacist.

## 2023-09-16 MED ORDER — FLUTICASONE-SALMETEROL 100-50 MCG/ACT IN AEPB: 1.0000 | INHALATION_SPRAY | Freq: Two times a day (BID) | RESPIRATORY_TRACT | 2 refills | Status: AC

## 2023-09-16 NOTE — Telephone Encounter (Signed)
 Requested Prescriptions  Pending Prescriptions Disp Refills   fluticasone -salmeterol (WIXELA INHUB) 100-50 MCG/ACT AEPB 180 each 2    Sig: Inhale 1 puff into the lungs 2 (two) times daily.     Pulmonology:  Combination Products Failed - 09/16/2023  3:11 PM      Failed - Valid encounter within last 12 months    Recent Outpatient Visits           1 year ago General medical exam   [facility] Duanne Butler DASEN, MD   1 year ago Other infective acute otitis externa of right ear   [facility] Duanne Butler DASEN, MD   2 years ago Acute bacterial rhinosinusitis   [facility] Duanne, Butler DASEN, MD   2 years ago Encounter for annual wellness exam in Medicare patient   [facility] Chandra Harlene LABOR, NP   2 years ago Chronic pain of right knee   [facility] Pickard, Butler DASEN, MD       Future Appointments             In 1 month Delford, Maude BROCKS, MD [id number]

## 2023-10-09 ENCOUNTER — Other Ambulatory Visit: Payer: Self-pay | Admitting: Physician Assistant

## 2023-10-09 ENCOUNTER — Other Ambulatory Visit: Payer: Self-pay | Admitting: Cardiovascular Disease

## 2023-10-09 NOTE — Progress Notes (Signed)
Date:  10/17/2023    Erin Wong Date of Birth: 1951/01/27 Medical Record #[id number]  PCP:  Donita Brooks, MD  Cardiologist:  Townsend Roger      History of Present Illness: 73 y.o. with history of PAF failed [facility] 01/17/20 and converted with amiodarone I last saw her 2022 with virtual visit  Tends to be bradycardic and both beta blocker and amiodarone doses have been cut back Holter 04/26/20 showed <1% PAF burden TTE 05/24/20 showed that her EF normalized in sinus to 60-65% moderate LAE and mild MR  Had repeat right knee surgery 12/04/20 by Dr Roda Shutters for post traumatic arthritis With ORIF right bicondylar tibial plateau fracture with retained hardware which was removed Eliquis held prior to procedure with no issues Had back and neck surgery with Dr Lovell Sheehan as well   She has been compliant with meds for HLD, HTN and uses albuterol for asthma   Felt poorly on narcotic pain meds. Some nausea Seen in ER 01/16/21 with nausea and atypical chest pain HR low R/O no acute ECG changes other than bradycardia and troponin negative x 2 01/19/21 coreg dose decreased   Amiodarone labs normal 09/11/23 09/06/20 PFTls 01/22/21 Only mild decrease in DLCO  No cardiac issues       Past Medical History:  Diagnosis Date   Allergy    Arthritis    Asthma    Atrial fibrillation (HCC)    Benign essential HTN    CHF (congestive heart failure) (HCC)    CKD (chronic kidney disease) stage 3, GFR 30-59 ml/min (HCC)    Dysrhythmia     Afib -  had Ablation   GERD (gastroesophageal reflux disease)    HLD (hyperlipidemia)    HTN (hypertension)    Pneumonia     Past Surgical History:  Procedure Laterality Date   ABDOMINAL HYSTERECTOMY  10/04/1995   Hysterectomy and Bilateral oophorectomy   CARDIOVERSION N/A 01/17/2020   Procedure: CARDIOVERSION;  Surgeon: Wendall Stade, MD;  Location: [facility] ENDOSCOPY;  Service: Cardiovascular;  Laterality: N/A;   COLON SURGERY  09/2009   hole in colon   FRACTURE SURGERY  Right 08/02/2011   Right Leg--Plates & Screws   HAND SURGERY Right 09/02/2006   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--Arthritis   HARDWARE REMOVAL Right 12/04/2020   Procedure: rigth knee hardware removal;  Surgeon: Tarry Kos, MD;  Location: [facility] OR;  Service: Orthopedics;  Laterality: Right;   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   at time of colon surgery   TEE WITHOUT CARDIOVERSION N/A 01/17/2020   Procedure: TRANSESOPHAGEAL ECHOCARDIOGRAM (TEE);  Surgeon: Wendall Stade, MD;  Location: [facility] ENDOSCOPY;  Service: Cardiovascular;  Laterality: N/A;   TOTAL KNEE ARTHROPLASTY Right 02/19/2021   Procedure: RIGHT TOTAL KNEE ARTHROPLASTY;  Surgeon: Tarry Kos, MD;  Location: [facility] OR;  Service: Orthopedics;  Laterality: Right;     Medications: Current Meds  Medication Sig   albuterol (VENTOLIN HFA) 108 (90 Base) MCG/ACT inhaler Inhale 1 puff into the lungs every 6 (six) hours as needed for wheezing or shortness of breath.   amiodarone (PACERONE) 200 MG tablet TAKE 1/2 TABLET BY MOUTH EVERY DAY. PLEASE SCHEDULE OVERDUE APPOINTMENT FOR FUTURE REFILLS.   apixaban (ELIQUIS) 5 MG TABS tablet Take 1 tablet (5 mg total) by mouth 2 (two) times daily.   atorvastatin (LIPITOR) 40 MG tablet Take 1 tablet (40 mg total) by mouth daily.   Biotin 5000 MCG TABS Take 5,000 mcg by mouth daily.  CALCIUM GLUCONATE PO Take 600 mg by mouth daily.   carvedilol (COREG) 3.125 MG tablet TAKE 1 TABLET BY MOUTH TWICE A DAY   Cholecalciferol 25 MCG (1000 UT) tablet Take 1 tablet (1,000 Units total) by mouth daily.   fluticasone (FLONASE) 50 MCG/ACT nasal spray Place 2 sprays into both nostrils daily as needed for allergies.   fluticasone-salmeterol (WIXELA INHUB) 100-50 MCG/ACT AEPB Inhale 1 puff into the lungs 2 (two) times daily.   furosemide (LASIX) 40 MG tablet TAKE 1 TABLET BY MOUTH EVERY DAY   polyethylene glycol (MIRALAX / GLYCOLAX) 17 g packet Take 17 g by mouth daily.   traMADol (ULTRAM) 50 MG tablet Take 2 tablets (100 mg total)  by mouth every 6 (six) hours as needed.     Allergies: No Known Allergies  Social History: The patient  reports that she quit smoking about 32 years ago. Her smoking use included cigarettes. She has never used smokeless tobacco. She reports that she does not currently use alcohol. She reports that she does not use drugs.   Family History: The patient's family history includes Alcohol abuse in her brother and father; Arthritis in her mother; Asthma in her maternal aunt; Breast cancer in her sister; COPD in her brother; Diabetes in her maternal aunt; Esophageal cancer in her mother; Kidney cancer in her brother; Lung cancer in her mother; Miscarriages / Stillbirths in her mother.   Review of Systems: Please see the history of present illness.   All other systems are reviewed and negative.   Physical Exam: VS:  BP 122/70   Pulse (!) 55   Ht 5\' 3"  (1.6 m)   Wt 155 lb 12.8 oz (70.7 kg)   SpO2 (!) 55%   BMI 27.60 kg/m  .  BMI Body mass index is 27.6 kg/m.  Wt Readings from Last 3 Encounters:  10/17/23 155 lb 12.8 oz (70.7 kg)  09/11/23 155 lb (70.3 kg)  01/16/23 150 lb 12.8 oz (68.4 kg)   Telephone no exam    LABORATORY DATA:  EKG:    SB rate 54 06/27/20    Lab Results  Component Value Date   WBC 8.4 09/11/2023   HGB 13.1 09/11/2023   HCT 39.6 09/11/2023   PLT 424 (H) 09/11/2023   GLUCOSE 75 09/11/2023   CHOL 206 (H) 09/11/2023   TRIG 54 09/11/2023   HDL 96 09/11/2023   LDLCALC 96 09/11/2023   ALT 16 09/11/2023   AST 17 09/11/2023   NA 139 09/11/2023   K 4.7 09/11/2023   CL 101 09/11/2023   CREATININE 1.15 (H) 09/11/2023   BUN 15 09/11/2023   CO2 26 09/11/2023   TSH 3.79 09/11/2023   INR 1.4 (H) 02/15/2021     BNP (last 3 results) No results for input(s): "BNP" in the last 8760 hours.   ProBNP (last 3 results) No results for input(s): "PROBNP" in the last 8760 hours.   Other Studies Reviewed Today:  LIMITED ECHO IMPRESSIONS 05/2020   1. Left  ventricular ejection fraction, by estimation, is 60 to 65%. The  left ventricle has normal function. The left ventricle has no regional  wall motion abnormalities. Left ventricular diastolic parameters are  consistent with Grade II diastolic  dysfunction (pseudonormalization).   2. Right ventricular systolic function is normal. The right ventricular  size is normal. There is normal pulmonary artery systolic pressure. The  estimated right ventricular systolic pressure is 33.9 mmHg.   3. Left atrial size was mild to moderately  dilated.   4. The mitral valve is normal in structure. Mild mitral valve  regurgitation. No evidence of mitral stenosis.   5. The aortic valve is tricuspid. Aortic valve regurgitation is trivial.  No aortic stenosis is present.   6. The inferior vena cava is normal in size with greater than 50%  respiratory variability, suggesting right atrial pressure of 3 mmHg.   Comparison(s): 01/14/20 EF 30-35%. PA pressure .     Monitor Study Highlights 04/2020   SR/SB average HR 51 bpm < 1% burden PAF PAC's < 1% total beats      Echocardiogram 01/14/2020: Impressions:  1. Left ventricular ejection fraction, by estimation, is 30 to 35%. The  left ventricle has moderately decreased function. The left ventricle  demonstrates global hypokinesis. The left ventricular internal cavity size  was mildly dilated. Left ventricular  diastolic parameters are indeterminate. There is akinesis of the left  ventricular, mid-apical inferior wall, anterior wall, inferolateral wall,  anterolateral wall and lateral wall. There is akinesis of the left  ventricular, apical septal wall and apical  segment.   2. Right ventricular systolic function is mildly reduced. The right  ventricular size is mildly enlarged. There is moderately elevated  pulmonary artery systolic pressure. The estimated right ventricular  systolic pressure is 48.4 mmHg.   3. Left atrial size was severely dilated.    4. The mitral valve is normal in structure. Mild to moderate mitral valve  regurgitation. No evidence of mitral stenosis.   5. Tricuspid valve regurgitation is mild to moderate.   6. The aortic valve is normal in structure. Aortic valve regurgitation is  trivial. No aortic stenosis is present.   7. The inferior vena cava is dilated in size with <50% respiratory  variability, suggesting right atrial pressure of 15 mmHg.          ASSESSMENT & PLAN:     1. Persistent AF - remains in NSR continue low dose amiodarone Update TTE and PFTls with DLCO  2. Chronic anticoagulation -no issues holding prior to knee surgery no bleeding issues normal lytes and Hct  3. Chronic systolic HF/NICM - tachy mediated normalized in sinus   4. HTN - Well controlled.  Continue current medications and low sodium Dash type diet.    5. HLD - on statin - LDL 84 acceptable no known vascular dx Discussed screen for CAD risk with calcium score Normal myovue 01/14/17   6. Ortho:  post right TKR 02/19/21 continue PT f/u Dr Roda Shutters  Current medicines are reviewed with the patient today.  The patient does not have concerns regarding medicines other than what has been noted above.  The following changes have been made:  See above.  Labs/ tests ordered today include:   PFTls DLCO TTE  Calcium score   Disposition:   FU  In  a year   Time:  spent reviewing chart and recent hospital stay for TKR labs, echo direct patient interview and composing note 22 minutes   Signed: Charlton Haws, MD  10/17/2023 10:03 AM  [facility] [address] [place], [place]  [address] Phone: [phone] Fax: [phone]

## 2023-10-10 ENCOUNTER — Other Ambulatory Visit: Payer: Self-pay

## 2023-10-10 ENCOUNTER — Telehealth: Payer: Self-pay | Admitting: Family Medicine

## 2023-10-10 ENCOUNTER — Other Ambulatory Visit: Payer: Self-pay | Admitting: Physician Assistant

## 2023-10-10 DIAGNOSIS — E7800 Pure hypercholesterolemia, unspecified: Secondary | ICD-10-CM

## 2023-10-10 MED ORDER — ATORVASTATIN CALCIUM 40 MG PO TABS
40.0000 mg | ORAL_TABLET | Freq: Every day | ORAL | 1 refills | Status: DC
Start: 2023-10-10 — End: 2023-10-17

## 2023-10-10 NOTE — Telephone Encounter (Signed)
 Prescription Request  10/10/2023  LOV: 09/11/2023  What is the name of the medication or equipment?   atorvastatin  (LIPITOR) 40 MG tablet  **patient has been out of meds for a few days**  Have you contacted your pharmacy to request a refill? Yes   Which pharmacy would you like this sent to?  [id number] [address], [place] - [address] [place] [address] Phone: [phone] Fax: [phone]    Patient notified that their request is being sent to the clinical staff for review and that they should receive a response within 2 business days.   Please advise patient at [phone].

## 2023-10-13 ENCOUNTER — Other Ambulatory Visit: Payer: Self-pay

## 2023-10-13 NOTE — Telephone Encounter (Signed)
 Prescription Request  10/13/2023  LOV: 09/11/23 CPE  What is the name of the medication or equipment? omeprazole  (PRILOSEC) 20 MG capsule [[id number]]  DISCONTINUED   Have you contacted your pharmacy to request a refill? Yes   Which pharmacy would you like this sent to?  [id number] [place] [address], [place] - [address] [place] [address] Phone: [phone] Fax: [phone]    Patient notified that their request is being sent to the clinical staff for review and that they should receive a response within 2 business days.   Please advise at [facility] [phone]

## 2023-10-14 NOTE — Telephone Encounter (Signed)
Requested medications are due for refill today.  no  Requested medications are on the active medications list.  no  Last refill. 02/25/2022 - Med has been d/c'd  Future visit scheduled.   yes  Notes to clinic.  Pt is requesting a refill    Requested Prescriptions  Pending Prescriptions Disp Refills   omeprazole (PRILOSEC) 20 MG capsule      Sig: Take by mouth daily.     Gastroenterology: Proton Pump Inhibitors Failed - 10/14/2023 11:56 AM      Failed - Valid encounter within last 12 months    Recent Outpatient Visits           1 year ago General medical exam   [facility] Donita Brooks, MD   1 year ago Other infective acute otitis externa of right ear   [facility] Donita Brooks, MD   2 years ago Acute bacterial rhinosinusitis   [facility] Tanya Nones Priscille Heidelberg, MD   2 years ago Encounter for annual wellness exam in Medicare patient   [facility] Valentino Nose, NP   3 years ago Chronic pain of right knee   [facility] Pickard, Priscille Heidelberg, MD       Future Appointments             In 3 days Wendall Stade, MD [facility], [id number]

## 2023-10-17 ENCOUNTER — Encounter: Payer: Self-pay | Admitting: Cardiovascular Disease

## 2023-10-17 ENCOUNTER — Ambulatory Visit: Payer: Medicare Other | Attending: Cardiovascular Disease | Admitting: Cardiovascular Disease

## 2023-10-17 VITALS — BP 122/70 | HR 55 | Ht 63.0 in | Wt 155.8 lb

## 2023-10-17 DIAGNOSIS — I10 Essential (primary) hypertension: Secondary | ICD-10-CM

## 2023-10-17 DIAGNOSIS — E785 Hyperlipidemia, unspecified: Secondary | ICD-10-CM

## 2023-10-17 DIAGNOSIS — E7800 Pure hypercholesterolemia, unspecified: Secondary | ICD-10-CM | POA: Diagnosis not present

## 2023-10-17 DIAGNOSIS — Z7901 Long term (current) use of anticoagulants: Secondary | ICD-10-CM

## 2023-10-17 DIAGNOSIS — I480 Paroxysmal atrial fibrillation: Secondary | ICD-10-CM | POA: Diagnosis not present

## 2023-10-17 MED ORDER — CARVEDILOL 3.125 MG PO TABS: 3.1250 mg | ORAL_TABLET | Freq: Two times a day (BID) | ORAL | 3 refills | Status: AC

## 2023-10-17 MED ORDER — AMIODARONE HCL 200 MG PO TABS: ORAL_TABLET | ORAL | 3 refills | Status: DC

## 2023-10-17 MED ORDER — APIXABAN 5 MG PO TABS: 5.0000 mg | ORAL_TABLET | Freq: Two times a day (BID) | ORAL | 3 refills | Status: AC

## 2023-10-17 MED ORDER — ATORVASTATIN CALCIUM 40 MG PO TABS
40.0000 mg | ORAL_TABLET | Freq: Every day | ORAL | 3 refills | Status: AC
Start: 2023-10-17 — End: ?

## 2023-10-17 MED ORDER — FUROSEMIDE 40 MG PO TABS: 40.0000 mg | ORAL_TABLET | Freq: Every day | ORAL | 3 refills | Status: AC

## 2023-10-17 NOTE — Patient Instructions (Signed)
Medication Instructions:  Your physician recommends that you continue on your current medications as directed. Please refer to the Current Medication list given to you today.  *If you need a refill on your cardiac medications before your next appointment, please call your pharmacy*  Lab Work: If you have labs (blood work) drawn today and your tests are completely normal, you will receive your results only by: MyChart Message (if you have MyChart) OR A paper copy in the mail If you have any lab test that is abnormal or we need to change your treatment, we will call you to review the results.  Testing/Procedures: Your physician has requested that you have an echocardiogram. Echocardiography is a painless test that uses sound waves to create images of your heart. It provides your doctor with information about the size and shape of your heart and how well your heart's chambers and valves are working. This procedure takes approximately one hour. There are no restrictions for this procedure. Please do NOT wear cologne, perfume, aftershave, or lotions (deodorant is allowed). Please arrive 15 minutes prior to your appointment time.  Please note: We ask at that you not bring children with you during ultrasound (echo/ vascular) testing. Due to room size and safety concerns, children are not allowed in the ultrasound rooms during exams. Our front office staff cannot provide observation of children in our lobby area while testing is being conducted. An adult accompanying a patient to their appointment will only be allowed in the ultrasound room at the discretion of the ultrasound technician under special circumstances. We apologize for any inconvenience. Your physician has recommended that you have a pulmonary function test. Pulmonary Function Tests are a group of tests that measure how well air moves in and out of your lungs.  CT scanning for a cardiac calcium score (CAT scanning), is a noninvasive, special  x-ray that produces cross-sectional images of the body using x-rays and a computer. CT scans help physicians diagnose and treat medical conditions. For some CT exams, a contrast material is used to enhance visibility in the area of the body being studied. CT scans provide greater clarity and reveal more details than regular x-ray exams.  Follow-Up: At [facility], you and your health needs are our priority.  As part of our continuing mission to provide you with exceptional heart care, we have created designated Provider Care Teams.  These Care Teams include your primary Cardiologist (physician) and Advanced Practice Providers (APPs -  Physician Assistants and Nurse Practitioners) who all work together to provide you with the care you need, when you need it.  We recommend signing up for the patient portal called "MyChart".  Sign up information is provided on this After Visit Summary.  MyChart is used to connect with patients for Virtual Visits (Telemedicine).  Patients are able to view lab/test results, encounter notes, upcoming appointments, etc.  Non-urgent messages can be sent to your provider as well.   To learn more about what you can do with MyChart, go to [email or web address].    Your next appointment:   1 year(s)  Provider:   Charlton Haws, MD     Other Instructions   1st Floor: - Lobby - Registration  - Pharmacy  - Lab - Cafe  2nd Floor: - PV Lab - Diagnostic Testing (echo, CT, nuclear med)  3rd Floor: - Vacant  4th Floor: - TCTS (cardiothoracic surgery) - AFib Clinic - Structural Heart Clinic - Vascular Surgery  - Vascular Ultrasound  5th Floor: -  HeartCare Cardiology (general and EP) - Clinical Pharmacy for coumadin, hypertension, lipid, weight-loss medications, and med management appointments    Valet parking services will be available as well.

## 2023-10-20 ENCOUNTER — Ambulatory Visit (HOSPITAL_COMMUNITY)
Admission: RE | Admit: 2023-10-20 | Discharge: 2023-10-20 | Disposition: A | Discharge status: Home / Self Care | Payer: Self-pay | Source: Ambulatory Visit | Attending: Cardiovascular Disease | Admitting: Cardiovascular Disease

## 2023-10-20 DIAGNOSIS — E7800 Pure hypercholesterolemia, unspecified: Secondary | ICD-10-CM | POA: Insufficient documentation

## 2023-10-20 DIAGNOSIS — Z7901 Long term (current) use of anticoagulants: Secondary | ICD-10-CM | POA: Insufficient documentation

## 2023-10-20 DIAGNOSIS — E785 Hyperlipidemia, unspecified: Secondary | ICD-10-CM | POA: Insufficient documentation

## 2023-10-20 DIAGNOSIS — I480 Paroxysmal atrial fibrillation: Secondary | ICD-10-CM | POA: Insufficient documentation

## 2023-10-20 DIAGNOSIS — I10 Essential (primary) hypertension: Secondary | ICD-10-CM | POA: Insufficient documentation

## 2023-10-23 ENCOUNTER — Telehealth: Payer: Self-pay | Admitting: Cardiovascular Disease

## 2023-10-23 DIAGNOSIS — I25118 Atherosclerotic heart disease of native coronary artery with other forms of angina pectoris: Secondary | ICD-10-CM

## 2023-10-23 DIAGNOSIS — E7800 Pure hypercholesterolemia, unspecified: Secondary | ICD-10-CM

## 2023-10-23 DIAGNOSIS — I10 Essential (primary) hypertension: Secondary | ICD-10-CM

## 2023-10-23 NOTE — Telephone Encounter (Signed)
 Wendall Stade, MD to Me     10/20/23  3:03 PM Result Note Calcium score fairly high 538 schedule her for PET/CT  The patient has been notified of the result and verbalized understanding.  All questions (if any) were answered. Cindi Carbon [place], RN 10/23/2023 12:33 PM   Will place order of test. Will mail letter of instructions to patient.

## 2023-10-23 NOTE — Telephone Encounter (Signed)
 Patient returned RN's call regarding results.

## 2023-10-24 ENCOUNTER — Encounter: Payer: Self-pay | Admitting: Orthopaedic Surgery

## 2023-10-24 ENCOUNTER — Other Ambulatory Visit (INDEPENDENT_AMBULATORY_CARE_PROVIDER_SITE_OTHER): Payer: Medicare Other

## 2023-10-24 ENCOUNTER — Ambulatory Visit: Payer: Medicare Other | Admitting: Orthopaedic Surgery

## 2023-10-24 DIAGNOSIS — G8929 Other chronic pain: Secondary | ICD-10-CM | POA: Diagnosis not present

## 2023-10-24 DIAGNOSIS — Z96651 Presence of right artificial knee joint: Secondary | ICD-10-CM | POA: Diagnosis not present

## 2023-10-24 DIAGNOSIS — M25561 Pain in right knee: Secondary | ICD-10-CM

## 2023-10-24 NOTE — Progress Notes (Signed)
 Office Visit Note   Patient: Erin Wong           Date of Birth: [birth year]           MRN: [id number] Visit Date: 10/24/2023              Requested by: Donita Brooks, MD [address] [facility] [address] [place],  [place] [address] PCP: Donita Brooks, MD   Assessment & Plan: Visit Diagnoses:  1. Chronic pain of right knee   2. Status post total right knee replacement     Plan: impression is right knee pain after sustaining a fall a few months ago.  Xrays are stable and without fracture.  She likely has a contusion.  We have recommended symptomatic care.  Follow up with [place] as needed.     Follow-Up Instructions: Return if symptoms worsen or fail to improve.   Orders:  Orders Placed This Encounter  Procedures   XR KNEE 3 VIEW RIGHT   XR Tibia/Fibula Right   No orders of the defined types were placed in this encounter.     Procedures: No procedures performed   Clinical Data: No additional findings.   Subjective: Chief Complaint  Patient presents with   Right Knee - Pain    HPI patient is a pleasant 73 year old female who comes in today with concerns about her right knee.  She is status post right total knee replacement 02/19/2021.  She tells me she was doing well until about a month or 2 ago when she fell landing on the front of her knees.  She has had pain diffusely across the right knee.  Symptoms primarily occur after she has been doing her exercises and then at night where she has associated tingling and spasms.  She denies any pain with weightbearing.  She has been taking tramadol which does help.  Review of Systems as detailed in HPI.  All others reviewed and are negative.   Objective: Vital Signs: There were no vitals taken for this visit.  Physical Exam well-developed well-nourished female in no acute distress.  Alert and oriented x 3.  Ortho Exam right knee exam: She does have a small effusion.  Range of motion 0 to 120 degrees.  She has lateral joint line  tenderness.  Tenderness to the medial and lateral patellar facet.  Tenderness to the proximal half of the tibia.  She is stable to valgus and varus stress.  She is neurovascularly intact distally.  Baseline gait pattern.  Specialty Comments:  No specialty comments available.  Imaging: XR KNEE 3 VIEW RIGHT Result Date: 10/24/2023 X-rays of the right knee show total knee arthroplasty with cemented stemmed components.  No subsidence or loosening.  XR Tibia/Fibula Right Result Date: 10/24/2023 X-rays of the right tib-fib show remote fractures to the mid shaft of the tibia/fibula which are unchanged from two years ago.    PMFS History: Patient Active Problem List   Diagnosis Date Noted   Tinnitus, right ear 03/21/2023   Status post total right knee replacement 02/19/2021   Post-traumatic osteoarthritis of right knee 12/04/2020   Positive colorectal cancer screening using Cologuard test 07/02/2020   History of colectomy 07/02/2020   Colon cancer screening 07/02/2020   CHF (congestive heart failure) (HCC)    Acute respiratory failure with hypoxia (HCC) 02/12/2020   Acute bronchitis 02/12/2020   Acute systolic heart failure (HCC) 01/19/2020   Atrial fibrillation with rapid ventricular response (HCC) 01/13/2020   CKD (chronic kidney  disease) stage 3, GFR 30-59 ml/min (HCC)    Benign essential HTN    HLD (hyperlipidemia)    Hyperlipidemia 02/12/2017   AKI (acute kidney injury) (HCC)    Chest pain 01/13/2017   Chest pressure    Acute kidney injury superimposed on CKD (HCC)    Osteoporosis 10/18/2015   Vitamin D deficiency 07/20/2015   HTN (hypertension) 07/17/2015   GERD (gastroesophageal reflux disease) 01/12/2015   Chronic constipation 01/12/2015   History of colon surgery 01/12/2015   History of total hysterectomy with bilateral salpingo-oophorectomy (BSO) 01/12/2015   Allergy    Asthma    Past Medical History:  Diagnosis Date   Allergy    Arthritis    Asthma    Atrial  fibrillation (HCC)    Benign essential HTN    CHF (congestive heart failure) (HCC)    CKD (chronic kidney disease) stage 3, GFR 30-59 ml/min (HCC)    Dysrhythmia     Afib -  had Ablation   GERD (gastroesophageal reflux disease)    HLD (hyperlipidemia)    HTN (hypertension)    Pneumonia     Family History  Problem Relation Age of Onset   Arthritis Mother    Miscarriages / [place] Mother    Lung cancer Mother        Had quit smoking for 25 years   Esophageal cancer Mother    Alcohol abuse Father    Alcohol abuse Brother    Kidney cancer Brother    Breast cancer Sister    COPD Brother    Asthma Maternal Aunt    Diabetes Maternal Aunt    Colon cancer Neg Hx    Inflammatory bowel disease Neg Hx    Liver disease Neg Hx    Pancreatic cancer Neg Hx    Rectal cancer Neg Hx    Stomach cancer Neg Hx     Past Surgical History:  Procedure Laterality Date   ABDOMINAL HYSTERECTOMY  10/04/1995   Hysterectomy and Bilateral oophorectomy   CARDIOVERSION N/A 01/17/2020   Procedure: CARDIOVERSION;  Surgeon: Wendall Stade, MD;  Location: [facility] ENDOSCOPY;  Service: Cardiovascular;  Laterality: N/A;   COLON SURGERY  09/2009   hole in colon   FRACTURE SURGERY Right 08/02/2011   Right Leg--Plates & Screws   HAND SURGERY Right 09/02/2006   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--Arthritis   HARDWARE REMOVAL Right 12/04/2020   Procedure: rigth knee hardware removal;  Surgeon: Tarry Kos, MD;  Location: [facility] OR;  Service: Orthopedics;  Laterality: Right;   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   at time of colon surgery   TEE WITHOUT CARDIOVERSION N/A 01/17/2020   Procedure: TRANSESOPHAGEAL ECHOCARDIOGRAM (TEE);  Surgeon: Wendall Stade, MD;  Location: [facility] ENDOSCOPY;  Service: Cardiovascular;  Laterality: N/A;   TOTAL KNEE ARTHROPLASTY Right 02/19/2021   Procedure: RIGHT TOTAL KNEE ARTHROPLASTY;  Surgeon: Tarry Kos, MD;  Location: [facility] OR;  Service: Orthopedics;  Laterality: Right;   Social History    Occupational History   Occupation: retired  Tobacco Use   Smoking status: Former    Current packs/day: 0.00    Types: Cigarettes    Quit date: 01/10/1991    Years since quitting: 32.8   Smokeless tobacco: Never  Vaping Use   Vaping status: Never Used  Substance and Sexual Activity   Alcohol use: Not Currently    Comment: quit in May/2021   Drug use: No   Sexual activity: Never

## 2023-10-30 ENCOUNTER — Ambulatory Visit (INDEPENDENT_AMBULATORY_CARE_PROVIDER_SITE_OTHER): Payer: Medicare Other | Admitting: Family Medicine

## 2023-10-30 ENCOUNTER — Encounter: Payer: Self-pay | Admitting: Gastroenterology

## 2023-10-30 VITALS — BP 132/68 | HR 58 | Temp 97.9°F

## 2023-10-30 DIAGNOSIS — K115 Sialolithiasis: Secondary | ICD-10-CM

## 2023-10-30 NOTE — Progress Notes (Signed)
 Subjective:    Patient ID: Erin Wong, female    DOB: Dec 09, 1950, 73 y.o.   MRN: [id number] Patient states for the last month, she has been having intermittent swelling on the right side of her face.  The swelling is primarily at the angle of the mandible and underneath the right mandible in the area of the submandibular gland.  She states the swelling comes and goes.  At times there will be a palpable lump.  At other times will go away.  She states that yesterday the swelling was so bad that the right side of her face was swollen.  The swelling went up to the bottom of her ear/tragus.  There was swelling underneath her mandible was tender and sore.  However the swelling went down overnight.  Today there is no palpable mass or any swelling on the right side of her face.  However I examined the inside part of her mouth.  On the floor of her mouth underneath her tongue there is a firm hard yellow nodule at the very end of the duct underneath her tongue on the right side.  I was able to grab this with a forcep and milk the hard yellow nodule out of the duct with a tongue depressor.  It was 2 mm x 1 mm and appeared to look like a small grain of rice Past Medical History:  Diagnosis Date   Allergy    Arthritis    Asthma    Atrial fibrillation (HCC)    Benign essential HTN    CHF (congestive heart failure) (HCC)    CKD (chronic kidney disease) stage 3, GFR 30-59 ml/min (HCC)    Dysrhythmia     Afib -  had Ablation   GERD (gastroesophageal reflux disease)    HLD (hyperlipidemia)    HTN (hypertension)    Pneumonia    Past Surgical History:  Procedure Laterality Date   ABDOMINAL HYSTERECTOMY  10/04/1995   Hysterectomy and Bilateral oophorectomy   CARDIOVERSION N/A 01/17/2020   Procedure: CARDIOVERSION;  Surgeon: Wendall Stade, MD;  Location: [facility] ENDOSCOPY;  Service: Cardiovascular;  Laterality: N/A;   COLON SURGERY  09/2009   hole in colon   FRACTURE SURGERY Right 08/02/2011   Right  Leg--Plates & Screws   HAND SURGERY Right 09/02/2006   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--Arthritis   HARDWARE REMOVAL Right 12/04/2020   Procedure: rigth knee hardware removal;  Surgeon: Tarry Kos, MD;  Location: [facility] OR;  Service: Orthopedics;  Laterality: Right;   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   at time of colon surgery   TEE WITHOUT CARDIOVERSION N/A 01/17/2020   Procedure: TRANSESOPHAGEAL ECHOCARDIOGRAM (TEE);  Surgeon: Wendall Stade, MD;  Location: [facility] ENDOSCOPY;  Service: Cardiovascular;  Laterality: N/A;   TOTAL KNEE ARTHROPLASTY Right 02/19/2021   Procedure: RIGHT TOTAL KNEE ARTHROPLASTY;  Surgeon: Tarry Kos, MD;  Location: [facility] OR;  Service: Orthopedics;  Laterality: Right;   Current Outpatient Medications on File Prior to Visit  Medication Sig Dispense Refill   albuterol (VENTOLIN HFA) 108 (90 Base) MCG/ACT inhaler Inhale 1 puff into the lungs every 6 (six) hours as needed for wheezing or shortness of breath. 18 g 3   amiodarone (PACERONE) 200 MG tablet TAKE 1/2 TABLET BY MOUTH EVERY DAY. 45 tablet 3   apixaban (ELIQUIS) 5 MG TABS tablet Take 1 tablet (5 mg total) by mouth 2 (two) times daily. 180 tablet 3   atorvastatin (LIPITOR) 40 MG tablet Take 1 tablet (40  mg total) by mouth daily. 90 tablet 3   Biotin 5000 MCG TABS Take 5,000 mcg by mouth daily.     CALCIUM GLUCONATE PO Take 600 mg by mouth daily.     carvedilol (COREG) 3.125 MG tablet Take 1 tablet (3.125 mg total) by mouth 2 (two) times daily. 180 tablet 3   Cholecalciferol 25 MCG (1000 UT) tablet Take 1 tablet (1,000 Units total) by mouth daily. 90 tablet 1   fluticasone (FLONASE) 50 MCG/ACT nasal spray Place 2 sprays into both nostrils daily as needed for allergies. 16 g 3   fluticasone-salmeterol (WIXELA INHUB) 100-50 MCG/ACT AEPB Inhale 1 puff into the lungs 2 (two) times daily. 180 each 2   furosemide (LASIX) 40 MG tablet Take 1 tablet (40 mg total) by mouth daily. 90 tablet 3   polyethylene glycol (MIRALAX / GLYCOLAX) 17 g  packet Take 17 g by mouth daily.     traMADol (ULTRAM) 50 MG tablet Take 2 tablets (100 mg total) by mouth every 6 (six) hours as needed. 180 tablet 3   No current facility-administered medications on file prior to visit.   No Known Allergies Social History   Socioeconomic History   Marital status: Married    Spouse name: Not on file   Number of children: 3   Years of education: Not on file   Highest education level: Not on file  Occupational History   Occupation: retired  Tobacco Use   Smoking status: Former    Current packs/day: 0.00    Types: Cigarettes    Quit date: 01/10/1991    Years since quitting: 32.8   Smokeless tobacco: Never  Vaping Use   Vaping status: Never Used  Substance and Sexual Activity   Alcohol use: Not Currently    Comment: quit in May/2021   Drug use: No   Sexual activity: Never  Other Topics Concern   Not on file  Social History Narrative   Not on file   Social Drivers of Health   Financial Resource Strain: Low Risk  (01/16/2023)   Overall Financial Resource Strain (CARDIA)    Difficulty of Paying Living Expenses: Not hard at all  Food Insecurity: No Food Insecurity (01/16/2023)   Hunger Vital Sign    Worried About Running Out of Food in the Last Year: Never true    Ran Out of Food in the Last Year: Never true  Transportation Needs: No Transportation Needs (01/16/2023)   PRAPARE - Administrator, Civil Service (Medical): No    Lack of Transportation (Non-Medical): No  Physical Activity: Insufficiently Active (01/16/2023)   Exercise Vital Sign    Days of Exercise per Week: 3 days    Minutes of Exercise per Session: 30 min  Stress: No Stress Concern Present (01/16/2023)   [employer] of Occupational Health - Occupational Stress Questionnaire    Feeling of Stress : Not at all  Social Connections: Moderately Integrated (01/16/2023)   Social Connection and Isolation Panel [NHANES]    Frequency of Communication with Friends and  Family: More than three times a week    Frequency of Social Gatherings with Friends and Family: Three times a week    Attends Religious Services: 1 to 4 times per year    Active Member of Clubs or Organizations: No    Attends Banker Meetings: Never    Marital Status: Married  Catering manager Violence: Not At Risk (01/16/2023)   Humiliation, Afraid, Rape, and Kick questionnaire  Fear of Current or Ex-Partner: No    Emotionally Abused: No    Physically Abused: No    Sexually Abused: No     Review of Systems  All other systems reviewed and are negative.      Objective:   Physical Exam Vitals reviewed.  Constitutional:      General: She is not in acute distress.    Appearance: Normal appearance. She is well-developed and normal weight. She is not ill-appearing, toxic-appearing or diaphoretic.  HENT:     Head: Normocephalic and atraumatic.     Right Ear: Tympanic membrane and ear canal normal.     Left Ear: Tympanic membrane and ear canal normal.     Nose: Nose normal. No congestion or rhinorrhea.     Mouth/Throat:     Palate: Lesions present.     Pharynx: Oropharynx is clear. No oropharyngeal exudate, posterior oropharyngeal erythema or uvula swelling.     Tonsils: No tonsillar exudate or tonsillar abscesses.  Eyes:     General: No scleral icterus.       Right eye: No discharge.        Left eye: No discharge.     Extraocular Movements: Extraocular movements intact.     Conjunctiva/sclera: Conjunctivae normal.     Pupils: Pupils are equal, round, and reactive to light.  Neck:     Vascular: No carotid bruit.  Cardiovascular:     Rate and Rhythm: Normal rate and regular rhythm.     Heart sounds: Normal heart sounds. No murmur heard.    No friction rub. No gallop.  Pulmonary:     Effort: Pulmonary effort is normal. No respiratory distress.     Breath sounds: Normal breath sounds. No stridor. No wheezing or rales.  Abdominal:     General: Bowel sounds are  normal. There is no distension.     Palpations: Abdomen is soft. There is no mass.     Tenderness: There is no abdominal tenderness. There is no guarding or rebound.  Musculoskeletal:        General: Deformity present.     Cervical back: No rigidity.     Right knee: Swelling present. Decreased range of motion. Tenderness present over the medial joint line and lateral joint line.     Right lower leg: No edema.     Left lower leg: No edema.  Lymphadenopathy:     Cervical: No cervical adenopathy.  Skin:    Findings: No lesion or rash.  Neurological:     General: No focal deficit present.     Mental Status: She is alert and oriented to person, place, and time. Mental status is at baseline.     Cranial Nerves: No cranial nerve deficit.     Sensory: No sensory deficit.     Motor: No weakness.     Coordination: Coordination normal.     Gait: Gait normal.  Psychiatric:        Mood and Affect: Mood normal.        Behavior: Behavior normal.        Thought Content: Thought content normal.        Judgment: Judgment normal.           Assessment & Plan:  Stone of salivary gland or duct I believe the patient is trying to pass a salivary gland stone.  I was able to help her using a pair of forceps and a tongue depressor.  We were able to remove 1 stone.  There is no swelling in the right submandibular or sublingual salivary glands today and there is no lymphadenopathy in the neck.  Recommended clinical monitoring.  If the swelling persist or returns I would recommend ENT consultation

## 2023-11-04 ENCOUNTER — Ambulatory Visit (HOSPITAL_COMMUNITY): Payer: Medicare Other | Attending: Cardiology

## 2023-11-04 DIAGNOSIS — Z7901 Long term (current) use of anticoagulants: Secondary | ICD-10-CM | POA: Diagnosis not present

## 2023-11-04 DIAGNOSIS — E7800 Pure hypercholesterolemia, unspecified: Secondary | ICD-10-CM | POA: Insufficient documentation

## 2023-11-04 DIAGNOSIS — I480 Paroxysmal atrial fibrillation: Secondary | ICD-10-CM | POA: Insufficient documentation

## 2023-11-04 DIAGNOSIS — E785 Hyperlipidemia, unspecified: Secondary | ICD-10-CM | POA: Insufficient documentation

## 2023-11-04 DIAGNOSIS — I10 Essential (primary) hypertension: Secondary | ICD-10-CM | POA: Insufficient documentation

## 2023-11-04 LAB — ECHOCARDIOGRAM COMPLETE
Area-P 1/2: 1.97 cm2
MV M vel: 5.37 m/s
MV Peak grad: 115.3 mmHg
S' Lateral: 3.04 cm

## 2023-11-05 ENCOUNTER — Telehealth: Payer: Self-pay | Admitting: Cardiovascular Disease

## 2023-11-05 DIAGNOSIS — R911 Solitary pulmonary nodule: Secondary | ICD-10-CM

## 2023-11-05 NOTE — Telephone Encounter (Signed)
 Wendall Stade, MD 11/05/2023  7:57 AM EST     EF normal just mild AR/MR f/u echo in a year   Wendall Stade, MD 10/20/2023  3:03 PM EST     Calcium score fairly high 538 schedule her for PET/CT   Called patient with results of her echo, calcium score, and CT over read. Will place order for referral.

## 2023-11-05 NOTE — Telephone Encounter (Signed)
 This is from the over read of patient's CT cardiac scoring. Will send to Dr. Eden Emms to review.   IMPRESSION: New indeterminate 1 cm nodular density in the right lower lobe. Follow-up chest CT recommended. Consider one of the following in 3 months for both low-risk and high-risk individuals: (a) repeat chest CT, (b) follow-up PET-CT, or (c) tissue sampling. This recommendation follows the consensus statement: Guidelines for Management of Incidental Pulmonary Nodules Detected on CT Images: From the Fleischner Society 2017; Radiology 2017; 284:228-243.

## 2023-11-05 NOTE — Telephone Encounter (Signed)
 Caller Mora Bellman) is reporting an addendum to results for test done on 2/17 is now available.

## 2023-11-05 NOTE — Telephone Encounter (Signed)
 Made appt

## 2023-11-25 DIAGNOSIS — L84 Corns and callosities: Secondary | ICD-10-CM | POA: Diagnosis not present

## 2023-11-25 DIAGNOSIS — L600 Ingrowing nail: Secondary | ICD-10-CM | POA: Diagnosis not present

## 2023-12-11 ENCOUNTER — Ambulatory Visit: Admitting: Emergency Medicine

## 2023-12-11 ENCOUNTER — Encounter: Payer: Self-pay | Admitting: Emergency Medicine

## 2023-12-11 VITALS — BP 153/74 | HR 50 | Ht 63.0 in | Wt 156.4 lb

## 2023-12-11 DIAGNOSIS — Z79899 Other long term (current) drug therapy: Secondary | ICD-10-CM | POA: Insufficient documentation

## 2023-12-11 DIAGNOSIS — R911 Solitary pulmonary nodule: Secondary | ICD-10-CM | POA: Diagnosis not present

## 2023-12-11 DIAGNOSIS — Z9189 Other specified personal risk factors, not elsewhere classified: Secondary | ICD-10-CM | POA: Diagnosis not present

## 2023-12-11 DIAGNOSIS — J4520 Mild intermittent asthma, uncomplicated: Secondary | ICD-10-CM | POA: Diagnosis not present

## 2023-12-11 NOTE — Assessment & Plan Note (Signed)
 Pulmonary nodule 1 cm nodular density in right lower lobe identified on CT. Absent on previous CT. Differential includes inflammatory changes, noninfectious inflammatory diseases, infections, or neoplastic processes. Smoking history increases lung cancer risk. PET scan not helpful due to size and nature. Bronchoscopy considered if malignancy suspicion increases. - Order follow-up CT scan of the chest in mid-May 2025 to monitor the pulmonary nodule. - Review follow-up CT scan results for changes in the nodule. - Consider bronchoscopy if nodule changes or malignancy suspicion increases.

## 2023-12-11 NOTE — Assessment & Plan Note (Signed)
 Asthma Asthma well-controlled with current inhaler regimen. No recent exacerbations or significant respiratory distress. - Continue current inhaler regimen with Wixela twice daily and Albuterol as needed. - Monitor for changes in asthma control or need for medication adjustments.

## 2023-12-11 NOTE — Patient Instructions (Signed)
  YOUR PLAN:  -PULMONARY NODULE: A one-centimeter nodule was found in your right lower lung on a recent CT scan. This could be due to inflammation, infection, or possibly cancer, especially given your smoking history. We will do a follow-up CT scan in mid-May 2025 to monitor this nodule. If there are any changes, we may consider a bronchoscopy to get a closer look.  -AMIODARONE USE: You are taking amiodarone for atrial fibrillation, which can sometimes cause lung changes. We will monitor you for any signs of lung inflammation and may repeat pulmonary function tests if needed.  -ASTHMA: Your asthma is well-controlled with your current inhalers. Continue using Wixela twice daily and Albuterol as needed. We will keep an eye on your asthma to see if any changes in your medication are necessary.  INSTRUCTIONS:  Please schedule a follow-up CT scan of your chest for mid-May 2025. We will review the results to see if there are any changes in the pulmonary nodule. If you notice any new symptoms or changes in your health, please contact our office.

## 2023-12-11 NOTE — Assessment & Plan Note (Signed)
 Amiodarone use On amiodarone for atrial fibrillation. Potential for amiodarone-induced lung changes considered in differential for pulmonary nodule. - Monitor for signs of pulmonary inflammation related to amiodarone. - Repeat pulmonary function testing ordered by Dr. Eden Emms, pending

## 2023-12-11 NOTE — Progress Notes (Signed)
 Subjective:    Patient ID: Erin Wong, female    DOB: [birth year], 73 y.o.   MRN: [id number]  HPI  Erin Wong is a 73 year old female with atrial fibrillation and CKD stage three who presents with an abnormal CT scan of the chest. She is accompanied by her husband, Link Snuffer. She was referred by Dr. Eden Emms for evaluation of an abnormal CT scan of the chest.  A calcium scoring CT scan on October 20, 2023, revealed a new one-centimeter nodular density in the right lower lobe, which was not present on a previous CT angiogram from February 12, 2020.  She has a history of atrial fibrillation for which she underwent unsuccessful DC cardioversion in May 2021 and subsequently responded to amiodarone. She is currently on anticoagulation and remains in normal sinus rhythm. Her pulmonary function testing in 2022 was reassuring.  She has a history of smoking, having smoked a pack a day from her early teens until she quit in the late 1990s after her mother's death from cancer. She currently uses Wixela twice a day and Albuterol as needed, though she has not needed the latter recently. No recent flare-ups requiring antibiotics or prednisone.  No current chest pain, breathing trouble, or palpitations. She reports a recent flare-up of allergies due to pollen, causing a 'heavy chest' but no cough. She is able to perform daily activities and walking, though she sometimes experiences fatigue that resolves with rest.  Her family history includes her mother having had throat cancer, her sister with breast cancer, and her brothers with various cancers, but no direct history of lung cancer.    RADIOLOGY Calcium scoring CT scan: 1 cm nodular density in the right lower lobe (10/20/2023) CT angio: No nodular density in the right lower lobe (02/12/2020)   Pulmonary function testing performed on 01/22/2021 reviewed by me, shows mild obstruction with an FEV1 of 1.71 L (80% predicted), no bronchodilator response, normal  lung volumes, decreased diffusion capacity (66% predicted)    Review of Systems As per HPI  Past Medical History:  Diagnosis Date   Allergy    Arthritis    Asthma    Atrial fibrillation (HCC)    Benign essential HTN    CHF (congestive heart failure) (HCC)    CKD (chronic kidney disease) stage 3, GFR 30-59 ml/min (HCC)    Dysrhythmia     Afib -  had Ablation   GERD (gastroesophageal reflux disease)    HLD (hyperlipidemia)    HTN (hypertension)    Pneumonia      Family History  Problem Relation Age of Onset   Arthritis Mother    Miscarriages / [place] Mother    Lung cancer Mother        Had quit smoking for 25 years   Esophageal cancer Mother    Alcohol abuse Father    Alcohol abuse Brother    Kidney cancer Brother    Breast cancer Sister    COPD Brother    Asthma Maternal Aunt    Diabetes Maternal Aunt    Colon cancer Neg Hx    Inflammatory bowel disease Neg Hx    Liver disease Neg Hx    Pancreatic cancer Neg Hx    Rectal cancer Neg Hx    Stomach cancer Neg Hx      Social History   Socioeconomic History   Marital status: Married    Spouse name: Not on file   Number of children: 3   Years  of education: Not on file   Highest education level: Not on file  Occupational History   Occupation: retired  Tobacco Use   Smoking status: Former    Current packs/day: 0.00    Types: Cigarettes    Quit date: 01/10/1991    Years since quitting: 32.9   Smokeless tobacco: Never  Vaping Use   Vaping status: Never Used  Substance and Sexual Activity   Alcohol use: Not Currently    Comment: quit in May/2021   Drug use: No   Sexual activity: Never  Other Topics Concern   Not on file  Social History Narrative   Not on file   Social Drivers of Health   Financial Resource Strain: Low Risk  (01/16/2023)   Overall Financial Resource Strain (CARDIA)    Difficulty of Paying Living Expenses: Not hard at all  Food Insecurity: No Food Insecurity (01/16/2023)   Hunger  Vital Sign    Worried About Running Out of Food in the Last Year: Never true    Ran Out of Food in the Last Year: Never true  Transportation Needs: No Transportation Needs (01/16/2023)   PRAPARE - Administrator, Civil Service (Medical): No    Lack of Transportation (Non-Medical): No  Physical Activity: Insufficiently Active (01/16/2023)   Exercise Vital Sign    Days of Exercise per Week: 3 days    Minutes of Exercise per Session: 30 min  Stress: No Stress Concern Present (01/16/2023)   [employer] of Occupational Health - Occupational Stress Questionnaire    Feeling of Stress : Not at all  Social Connections: Moderately Integrated (01/16/2023)   Social Connection and Isolation Panel [NHANES]    Frequency of Communication with Friends and Family: More than three times a week    Frequency of Social Gatherings with Friends and Family: Three times a week    Attends Religious Services: 1 to 4 times per year    Active Member of Clubs or Organizations: No    Attends Banker Meetings: Never    Marital Status: Married  Catering manager Violence: Not At Risk (01/16/2023)   Humiliation, Afraid, Rape, and Kick questionnaire    Fear of Current or Ex-Partner: No    Emotionally Abused: No    Physically Abused: No    Sexually Abused: No    - Former smoker, smoked a pack a day for 35 years  No Known Allergies   Outpatient Medications Prior to Visit  Medication Sig Dispense Refill   albuterol (VENTOLIN HFA) 108 (90 Base) MCG/ACT inhaler Inhale 1 puff into the lungs every 6 (six) hours as needed for wheezing or shortness of breath. 18 g 3   amiodarone (PACERONE) 200 MG tablet TAKE 1/2 TABLET BY MOUTH EVERY DAY. 45 tablet 3   apixaban (ELIQUIS) 5 MG TABS tablet Take 1 tablet (5 mg total) by mouth 2 (two) times daily. 180 tablet 3   atorvastatin (LIPITOR) 40 MG tablet Take 1 tablet (40 mg total) by mouth daily. 90 tablet 3   Biotin 5000 MCG TABS Take 5,000 mcg by mouth  daily.     carvedilol (COREG) 3.125 MG tablet Take 1 tablet (3.125 mg total) by mouth 2 (two) times daily. 180 tablet 3   Cholecalciferol 25 MCG (1000 UT) tablet Take 1 tablet (1,000 Units total) by mouth daily. 90 tablet 1   fluticasone (FLONASE) 50 MCG/ACT nasal spray Place 2 sprays into both nostrils daily as needed for allergies. 16 g 3  fluticasone-salmeterol (WIXELA INHUB) 100-50 MCG/ACT AEPB Inhale 1 puff into the lungs 2 (two) times daily. 180 each 2   furosemide (LASIX) 40 MG tablet Take 1 tablet (40 mg total) by mouth daily. 90 tablet 3   polyethylene glycol (MIRALAX / GLYCOLAX) 17 g packet Take 17 g by mouth daily.     traMADol (ULTRAM) 50 MG tablet Take 2 tablets (100 mg total) by mouth every 6 (six) hours as needed. 180 tablet 3   CALCIUM GLUCONATE PO Take 600 mg by mouth daily. (Patient not taking: Reported on 12/11/2023)     No facility-administered medications prior to visit.         Objective:   Physical Exam Vitals:   12/11/23 0947 12/11/23 0949  BP: (!) 150/70 (!) 153/74  Pulse: (!) 50   SpO2: 96%   Weight: 156 lb 6.4 oz (70.9 kg)   Height: 5\' 3"  (1.6 m)    Gen: Pleasant, well-nourished, in no distress,  normal affect  ENT: No lesions,  mouth clear,  oropharynx clear, no postnasal drip  Neck: No JVD, no stridor  Lungs: No use of accessory muscles, no crackles or wheezing on normal respiration, no wheeze on forced expiration  Cardiovascular: RRR, heart sounds normal, no murmur or gallops, no peripheral edema  Musculoskeletal: No deformities, no cyanosis or clubbing  Neuro: alert, awake, non focal  Skin: Warm, no lesions or rash       Assessment & Plan:   Pulmonary nodule 1 cm or greater in diameter Pulmonary nodule 1 cm nodular density in right lower lobe identified on CT. Absent on previous CT. Differential includes inflammatory changes, noninfectious inflammatory diseases, infections, or neoplastic processes. Smoking history increases lung cancer  risk. PET scan not helpful due to size and nature. Bronchoscopy considered if malignancy suspicion increases. - Order follow-up CT scan of the chest in mid-May 2025 to monitor the pulmonary nodule. - Review follow-up CT scan results for changes in the nodule. - Consider bronchoscopy if nodule changes or malignancy suspicion increases.    Asthma Asthma Asthma well-controlled with current inhaler regimen. No recent exacerbations or significant respiratory distress. - Continue current inhaler regimen with Wixela twice daily and Albuterol as needed. - Monitor for changes in asthma control or need for medication adjustments.  At risk for amiodarone toxicity with long term use Amiodarone use On amiodarone for atrial fibrillation. Potential for amiodarone-induced lung changes considered in differential for pulmonary nodule. - Monitor for signs of pulmonary inflammation related to amiodarone. - Repeat pulmonary function testing ordered by Dr. Eden Emms, pending   Levy Pupa, MD, PhD 12/11/2023, 11:48 AM [facility] [phone] or if no answer before 7:00PM call [phone] For any issues after 7:00PM please call eLink [phone]

## 2023-12-16 ENCOUNTER — Telehealth: Payer: Self-pay

## 2023-12-16 ENCOUNTER — Ambulatory Visit: Payer: Medicare Other | Admitting: Gastroenterology

## 2023-12-16 ENCOUNTER — Encounter: Payer: Self-pay | Admitting: Gastroenterology

## 2023-12-16 VITALS — BP 126/62 | HR 56 | Ht 63.0 in | Wt 156.5 lb

## 2023-12-16 DIAGNOSIS — Z01818 Encounter for other preprocedural examination: Secondary | ICD-10-CM | POA: Diagnosis not present

## 2023-12-16 DIAGNOSIS — Z860101 Personal history of adenomatous and serrated colon polyps: Secondary | ICD-10-CM | POA: Diagnosis not present

## 2023-12-16 DIAGNOSIS — Z7901 Long term (current) use of anticoagulants: Secondary | ICD-10-CM | POA: Diagnosis not present

## 2023-12-16 DIAGNOSIS — Z860100 Personal history of colon polyps, unspecified: Secondary | ICD-10-CM | POA: Insufficient documentation

## 2023-12-16 MED ORDER — NA SULFATE-K SULFATE-MG SULF 17.5-3.13-1.6 GM/177ML PO SOLN: 1.0000 | ORAL | 0 refills | Status: DC

## 2023-12-16 NOTE — Patient Instructions (Signed)
 You have been scheduled for a colonoscopy. Please follow written instructions given to you at your visit today.   If you use inhalers (even only as needed), please bring them with you on the day of your procedure.  DO NOT TAKE 7 DAYS PRIOR TO TEST- Trulicity (dulaglutide) Ozempic, Wegovy (semaglutide) Mounjaro (tirzepatide) Bydureon Bcise (exanatide extended release)  DO NOT TAKE 1 DAY PRIOR TO YOUR TEST Rybelsus (semaglutide) Adlyxin (lixisenatide) Victoza (liraglutide) Byetta (exanatide)   _______________________________________________________  If your blood pressure at your visit was 140/90 or greater, please contact your primary care physician to follow up on this.  _______________________________________________________  If you are age 72 or older, your body mass index should be between 23-30. Your Body mass index is 27.72 kg/m. If this is out of the aforementioned range listed, please consider follow up with your Primary Care Provider.  If you are age 67 or younger, your body mass index should be between 19-25. Your Body mass index is 27.72 kg/m. If this is out of the aformentioned range listed, please consider follow up with your Primary Care Provider.   ________________________________________________________  The [facility] providers would like to encourage you to use MYCHART to communicate with providers for non-urgent requests or questions.  Due to long hold times on the telephone, sending your provider a message by [facility] may be a faster and more efficient way to get a response.  Please allow 48 business hours for a response.  Please remember that this is for non-urgent requests.  _______________________________________________________  Erin Wong will be contaced by our office prior to your procedure for directions on holding your Eliquis.  If you do not hear from our office 1 week prior to your scheduled procedure, please call [phone] to discuss.  Thank you for  choosing me and [facility].  Dr. Brice Campi

## 2023-12-16 NOTE — Progress Notes (Signed)
 12/16/2023 Erin Wong [id number] 12-15-1950   HISTORY OF PRESENT ILLNESS: This is a 73 year old female who is a patient of Dr. Elesa Hacker.  She is here today to discuss and schedule colonoscopy.  Colonoscopy in January 2022 as below with a 3-year recall.  She is on Eliquis for history of atrial fibrillation.  Follows with Dr. Eden Emms.  She just had an ECHO that appears stable.  No new significant medical problems/cardiac issues.  Moves her bowels regularly for the most part, occasionally gets constipated.  No rectal bleeding.  Colonoscopy 09/2020: - Hemorrhoids found on digital rectal exam. - The examined portion of the ileum was normal. - Three 2 to 10 mm polyps in the descending colon, in the transverse colon and in the ascending colon, removed with a cold snare. Resected and retrieved. - Diverticulosis in the recto- sigmoid colon and in the sigmoid colon. - Normal mucosa in the entire examined colon otherwise. - Non- bleeding non- thrombosed external and internal hemorrhoids.  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 - COLONIC MUCOSA WITH UNDERLYING LYMPHOID AGGREGATE, NEGATIVE FOR DYSPLASIA (X2).    Past Medical History:  Diagnosis Date   Allergy    Arthritis    Asthma    Atrial fibrillation (HCC)    Benign essential HTN    CHF (congestive heart failure) (HCC)    CKD (chronic kidney disease) stage 3, GFR 30-59 ml/min (HCC)    Diverticulosis    Dysrhythmia     Afib -  had Ablation   GERD (gastroesophageal reflux disease)    HLD (hyperlipidemia)    HTN (hypertension)    Pneumonia    Past Surgical History:  Procedure Laterality Date   ABDOMINAL HYSTERECTOMY  10/04/1995   Hysterectomy and Bilateral oophorectomy   CARDIOVERSION N/A 01/17/2020   Procedure: CARDIOVERSION;  Surgeon: Wendall Stade, MD;  Location: [facility] ENDOSCOPY;  Service: Cardiovascular;  Laterality: N/A;   COLON SURGERY   09/2009   hole in colon   FRACTURE SURGERY Right 08/02/2011   Right Leg--Plates & Screws   HAND SURGERY Right 09/02/2006   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--Arthritis   HARDWARE REMOVAL Right 12/04/2020   Procedure: rigth knee hardware removal;  Surgeon: Tarry Kos, MD;  Location: [facility] OR;  Service: Orthopedics;  Laterality: Right;   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 Her smoking use included cigarettes. She has never used smokeless tobacco. She reports that she does not currently use alcohol. She reports that she does not use drugs. family history includes Alcohol abuse in her brother and father; Arthritis in her mother; Asthma in her maternal aunt; Breast cancer in her sister; COPD in her brother; Diabetes in her maternal aunt; Esophageal cancer in her mother; Kidney cancer in her brother; Lung cancer in her mother; Miscarriages / Stillbirths in her mother. No Known Allergies    Outpatient Encounter Medications as of 12/16/2023  Medication Sig   albuterol (VENTOLIN HFA) 108 (90 Base) MCG/ACT inhaler Inhale 1 puff into the lungs  every 6 (six) hours as needed for wheezing or shortness of breath.   amiodarone (PACERONE) 200 MG tablet TAKE 1/2 TABLET BY MOUTH EVERY DAY.   apixaban (ELIQUIS) 5 MG TABS tablet Take 1 tablet (5 mg total) by mouth 2 (two) times daily.   atorvastatin (LIPITOR) 40 MG tablet Take 1 tablet (40 mg total) by mouth daily.   Biotin 5000 MCG TABS Take 5,000 mcg by mouth daily.   carvedilol (COREG) 3.125 MG tablet Take 1 tablet (3.125 mg total)  by mouth 2 (two) times daily.   Cholecalciferol 25 MCG (1000 UT) tablet Take 1 tablet (1,000 Units total) by mouth daily.   Cyanocobalamin (VITAMIN B-12 PO) Take by mouth.   fluticasone (FLONASE) 50 MCG/ACT nasal spray Place 2 sprays into both nostrils daily as needed for allergies.   fluticasone-salmeterol (WIXELA INHUB) 100-50 MCG/ACT AEPB Inhale 1 puff into the lungs 2 (two) times daily.   furosemide (LASIX) 40 MG tablet Take 1 tablet (40 mg total) by mouth daily.   Misc Natural Products (NEURIVA PO) Take by mouth.   polyethylene glycol (MIRALAX / GLYCOLAX) 17 g packet Take 17 g by mouth daily.   traMADol (ULTRAM) 50 MG tablet Take 2 tablets (100 mg total) by mouth every 6 (six) hours as needed.   CALCIUM GLUCONATE PO Take 600 mg by mouth daily. (Patient not taking: Reported on 12/16/2023)   No facility-administered encounter medications on file as of 12/16/2023.    REVIEW OF SYSTEMS  : All other systems reviewed and negative except where noted in the History of Present Illness.   PHYSICAL EXAM: BP 126/62   Pulse (!) 56   Ht 5\' 3"  (1.6 m)   Wt 156 lb 8 oz (71 kg)   BMI 27.72 kg/m  General: Well developed white female in no acute distress Head: Normocephalic and atraumatic Eyes:  sclerae anicteric,conjunctive pink. Ears: Normal auditory acuity Lungs: Clear throughout to auscultation; no W/R/R. Heart: Regular rate and rhythm; no M/R/G. Rectal:  Will be done at the time of colonoscopy. Musculoskeletal: Symmetrical with no gross deformities  Skin: No lesions on visible extremities Neurological: Alert oriented x 4, grossly non-focal Psychological:  Alert and cooperative. Normal mood and affect  ASSESSMENT AND PLAN: *Personal history of colon polyps: Colonoscopy 2022 with some polyps removed, one was 10 mm in size, tubular adenomas.  Repeat recommended in 3 years.  Will schedule with Dr. Brice Campi. *Chronic anticoagulation:  Will hold Eliquis for 2 days prior to endoscopic  procedures - will instruct when and how to resume after procedure. Benefits and risks of procedure explained including risks of bleeding, perforation, infection, missed lesions, reactions to medications and possible need for hospitalization and surgery for complications. Additional rare but real risk of stroke or other vascular clotting events off Eliquis also explained and need to seek urgent help if any signs of these problems occur. Will communicate by phone or EMR with patient's prescribing provider, Dr. Stann Earnest, to confirm that holding Eliquis is reasonable in this case.     CC:  Austine Lefort, MD

## 2023-12-16 NOTE — Telephone Encounter (Signed)
 Request for surgical clearance:     Endoscopy Procedure  What type of surgery is being performed?     Colonoscopy   When is this surgery scheduled?     01/06/24  What type of clearance is required ?   Pharmacy  Are there any medications that need to be held prior to surgery and how long? Eliquis x2 days prior to procedure.   Practice name and name of physician performing surgery?      [facility]  What is your office phone and fax number?      Phone- [phone]  Fax- [phone]  Anesthesia type (None, local, MAC, general) ?       MAC  Please route your response to Patricio Boop, CMA

## 2023-12-17 ENCOUNTER — Telehealth: Payer: Self-pay

## 2023-12-17 NOTE — Telephone Encounter (Signed)
 Patient with diagnosis of afib on Eliquis for anticoagulation.    Procedure: Colonoscopy  Date of procedure: 01/06/24   CHA2DS2-VASc Score = 4   This indicates a 4.8% annual risk of stroke. The patient's score is based upon: CHF History: 1 HTN History: 1 Diabetes History: 0 Stroke History: 0 Vascular Disease History: 0 Age Score: 1 Gender Score: 1      CrCl 41 ml/min Platelet count 424  Per office protocol, patient can hold Eliquis for 2 days prior to procedure.    **This guidance is not considered finalized until pre-operative APP has relayed final recommendations.**

## 2023-12-17 NOTE — Progress Notes (Signed)
 Attending Physician's Attestation   I have reviewed the chart.   I agree with the Advanced Practitioner's note, impression, and recommendations with any updates as below.    Corliss Parish, MD [facility] Advanced Endoscopy Office # [phone]

## 2023-12-17 NOTE — Telephone Encounter (Signed)
   Patient Name: Erin Wong  DOB: 08/25/1951 MRN: [id number]  Primary Cardiologist: Janelle Mediate, MD  Clinical pharmacists have reviewed the patient's past medical history, labs, and current medications as part of preoperative protocol coverage. The following recommendations have been made:   Per office protocol, patient can hold Eliquis for 2 days prior to procedure.    I will route this recommendation to the requesting party via Epic fax function and remove from pre-op pool.  Please call with questions.  Francene Ing, Retha Cast, NP 12/17/2023, 9:35 AM

## 2023-12-17 NOTE — Telephone Encounter (Signed)
 Prescription Request  12/17/2023  LOV: 10/30/23  What is the name of the medication or equipment? traMADol (ULTRAM) 50 MG tablet [[id number]]   Have you contacted your pharmacy to request a refill? Yes   Which pharmacy would you like this sent to?  [id number] Jonette Nestle, [place] - [address] [place] [address] Phone: [phone] Fax: [phone]    Patient notified that their request is being sent to the clinical staff for review and that they should receive a response within 2 business days.   Please advise at [facility] [phone]

## 2023-12-18 ENCOUNTER — Other Ambulatory Visit: Payer: Self-pay | Admitting: Family Medicine

## 2023-12-18 MED ORDER — TRAMADOL HCL 50 MG PO TABS: 100.0000 mg | ORAL_TABLET | Freq: Four times a day (QID) | ORAL | 3 refills | Status: DC | PRN

## 2023-12-30 ENCOUNTER — Encounter: Payer: Self-pay | Admitting: Gastroenterology

## 2024-01-01 NOTE — Telephone Encounter (Signed)
 Patient informed okay to Eliquis  2 days prior to procedure per Cardiology. Patient voiced understanding.

## 2024-01-06 ENCOUNTER — Encounter: Payer: Self-pay | Admitting: Gastroenterology

## 2024-01-06 ENCOUNTER — Ambulatory Visit: Admitting: Gastroenterology

## 2024-01-06 ENCOUNTER — Telehealth (HOSPITAL_COMMUNITY): Payer: Self-pay | Admitting: Emergency Medicine

## 2024-01-06 VITALS — BP 134/50 | HR 51 | Temp 97.9°F | Resp 10 | Ht 63.0 in | Wt 156.0 lb

## 2024-01-06 DIAGNOSIS — D123 Benign neoplasm of transverse colon: Secondary | ICD-10-CM

## 2024-01-06 DIAGNOSIS — I10 Essential (primary) hypertension: Secondary | ICD-10-CM | POA: Diagnosis not present

## 2024-01-06 DIAGNOSIS — Z860101 Personal history of adenomatous and serrated colon polyps: Secondary | ICD-10-CM

## 2024-01-06 DIAGNOSIS — K644 Residual hemorrhoidal skin tags: Secondary | ICD-10-CM | POA: Diagnosis not present

## 2024-01-06 DIAGNOSIS — J45909 Unspecified asthma, uncomplicated: Secondary | ICD-10-CM | POA: Diagnosis not present

## 2024-01-06 DIAGNOSIS — K641 Second degree hemorrhoids: Secondary | ICD-10-CM

## 2024-01-06 DIAGNOSIS — I4891 Unspecified atrial fibrillation: Secondary | ICD-10-CM | POA: Diagnosis not present

## 2024-01-06 DIAGNOSIS — D125 Benign neoplasm of sigmoid colon: Secondary | ICD-10-CM

## 2024-01-06 DIAGNOSIS — I509 Heart failure, unspecified: Secondary | ICD-10-CM | POA: Diagnosis not present

## 2024-01-06 DIAGNOSIS — K5730 Diverticulosis of large intestine without perforation or abscess without bleeding: Secondary | ICD-10-CM

## 2024-01-06 DIAGNOSIS — Z1211 Encounter for screening for malignant neoplasm of colon: Secondary | ICD-10-CM

## 2024-01-06 DIAGNOSIS — Z860100 Personal history of colon polyps, unspecified: Secondary | ICD-10-CM

## 2024-01-06 MED ORDER — SODIUM CHLORIDE 0.9 % IV SOLN: 500.0000 mL | Freq: Once | INTRAVENOUS | Status: DC

## 2024-01-06 NOTE — Progress Notes (Signed)
 Sedate, gd SR, tolerated procedure well, VSS, report to RN

## 2024-01-06 NOTE — Op Note (Addendum)
 [facility] Patient Name: Erin Wong Procedure Date: 01/06/2024 8:25 AM MRN: [id number] Endoscopist: Yong Henle , MD, [id number] Age: 73 Referring MD:  Date of Birth: [birth year] Gender: Female Account #: [id number] Procedure:                Colonoscopy Indications:              Surveillance: Personal history of adenomatous                            polyps on last colonoscopy 3 years ago Medicines:                Monitored Anesthesia Care Procedure:                Pre-Anesthesia Assessment:                           - Prior to the procedure, a History and Physical                            was performed, and patient medications and                            allergies were reviewed. The patient's tolerance of                            previous anesthesia was also reviewed. The risks                            and benefits of the procedure and the sedation                            options and risks were discussed with the patient.                            All questions were answered, and informed consent                            was obtained. Prior Anticoagulants: The patient has                            taken Eliquis  (apixaban ), last dose was 2 days                            prior to procedure. ASA Grade Assessment: III - A                            patient with severe systemic disease. After                            reviewing the risks and benefits, the patient was                            deemed in satisfactory condition to undergo the  procedure.                           After obtaining informed consent, the colonoscope                            was passed under direct vision. Throughout the                            procedure, the patient's blood pressure, pulse, and                            oxygen  saturations were monitored continuously. The                            CF [id number] #[id number] was introduced through the  anus                            and advanced to the 3 cm into the ileum. The                            colonoscopy was performed without difficulty. The                            patient tolerated the procedure. The quality of the                            bowel preparation was good. The terminal ileum,                            ileocecal valve, appendiceal orifice, and rectum                            were photographed. Scope In: 8:51:11 AM Scope Out: 9:02:16 AM Scope Withdrawal Time: 0 hours 7 minutes 48 seconds  Total Procedure Duration: 0 hours 11 minutes 5 seconds  Findings:                 The digital rectal exam findings include                            hemorrhoids. Pertinent negatives include no                            palpable rectal lesions.                           The terminal ileum and ileocecal valve appeared                            normal.                           Two sessile polyps were found in the sigmoid colon  and transverse colon. The polyps were 4 to 5 mm in                            size. These polyps were removed with a cold snare.                            Resection and retrieval were complete.                           Multiple medium-mouthed and small-mouthed                            diverticula were found in the entire colon.                           Normal mucosa was found in the entire colon                            otherwise.                           Non-bleeding non-thrombosed external and internal                            hemorrhoids were found during retroflexion, during                            perianal exam and during digital exam. The                            hemorrhoids were Grade II (internal hemorrhoids                            that prolapse but reduce spontaneously). Complications:            No immediate complications. Estimated Blood Loss:     Estimated blood loss was  minimal. Impression:               - Hemorrhoids found on digital rectal exam.                           - The examined portion of the ileum was normal.                           - Two 4 to 5 mm polyps in the sigmoid colon and in                            the transverse colon, removed with a cold snare.                            Resected and retrieved.                           - Diverticulosis in the entire examined colon.                           -  Normal mucosa in the entire examined colon                            otherwise.                           - Non-bleeding non-thrombosed external and internal                            hemorrhoids. Recommendation:           - The patient will be observed post-procedure,                            until all discharge criteria are met.                           - Discharge patient to home.                           - Patient has a contact number available for                            emergencies. The signs and symptoms of potential                            delayed complications were discussed with the                            patient. Return to normal activities tomorrow.                            Written discharge instructions were provided to the                            patient.                           - High fiber diet.                           - Use FiberCon 1-2 tablets PO daily.                           - May restart Eliquis  on 5/7 to decrease risk of                            post interventional bleeding.                           - Continue present medications.                           - Await pathology results.                           - Repeat colonoscopy in 5 years for surveillance  due to history of previous adenoma, based on                            patient's age and medical comorbidities would need                            to be discussed with in clinic.                            - The findings and recommendations were discussed                            with the patient.                           - The findings and recommendations were discussed                            with the patient's family. Yong Henle, MD 01/06/2024 9:07:43 AM

## 2024-01-06 NOTE — Progress Notes (Signed)
 Pt's states no medical or surgical changes since previsit or office visit.

## 2024-01-06 NOTE — Telephone Encounter (Signed)
 Reaching out to patient to offer assistance regarding upcoming cardiac imaging study; pt verbalizes understanding of appt date/time, parking situation and where to check in, pre-test NPO status and medications ordered, and verified current allergies; name and call back number provided for further questions should they arise Rockwell Alexandria RN Navigator Cardiac Imaging [facility] [phone] office [phone] cell

## 2024-01-06 NOTE — Patient Instructions (Addendum)
 Resume previous diet and medications.  Biopsy results and follow up colonoscopy recommendations will be send via MyChart or by letter.  Restart Eliquis  5/7.  Educational handouts on polyps, hemorrhoids, and diverticulosis provided.   YOU HAD AN ENDOSCOPIC PROCEDURE TODAY AT THE [facility]:   Refer to the procedure report that was given to you for any specific questions about what was found during the examination.  If the procedure report does not answer your questions, please call your gastroenterologist to clarify.  If you requested that your care partner not be given the details of your procedure findings, then the procedure report has been included in a sealed envelope for you to review at your convenience later.  YOU SHOULD EXPECT: Some feelings of bloating in the abdomen. Passage of more gas than usual.  Walking can help get rid of the air that was put into your GI tract during the procedure and reduce the bloating. If you had a lower endoscopy (such as a colonoscopy or flexible sigmoidoscopy) you may notice spotting of blood in your stool or on the toilet paper. If you underwent a bowel prep for your procedure, you may not have a normal bowel movement for a few days.  Please Note:  You might notice some irritation and congestion in your nose or some drainage.  This is from the oxygen  used during your procedure.  There is no need for concern and it should clear up in a day or so.  SYMPTOMS TO REPORT IMMEDIATELY:  Following lower endoscopy (colonoscopy or flexible sigmoidoscopy):  Excessive amounts of blood in the stool  Significant tenderness or worsening of abdominal pains  Swelling of the abdomen that is new, acute  Fever of 100F or higher  For urgent or emergent issues, a gastroenterologist can be reached at any hour by calling [phone]. Do not use MyChart messaging for urgent concerns.    DIET:  We do recommend a small meal at first, but then you may proceed to your  regular diet.  Drink plenty of fluids but you should avoid alcoholic beverages for 24 hours.  ACTIVITY:  You should plan to take it easy for the rest of today and you should NOT DRIVE or use heavy machinery until tomorrow (because of the sedation medicines used during the test).    FOLLOW UP: Our staff will call the number listed on your records the next business day following your procedure.  We will call around 7:15- 8:00 am to check on you and address any questions or concerns that you may have regarding the information given to you following your procedure. If we do not reach you, we will leave a message.     If any biopsies were taken you will be contacted by phone or by letter within the next 1-3 weeks.  Please call us  at [phone] if you have not heard about the biopsies in 3 weeks.    SIGNATURES/CONFIDENTIALITY: You and/or your care partner have signed paperwork which will be entered into your electronic medical record.  These signatures attest to the fact that that the information above on your After Visit Summary has been reviewed and is understood.  Full responsibility of the confidentiality of this discharge information lies with you and/or your care-partner.

## 2024-01-06 NOTE — Progress Notes (Signed)
 GASTROENTEROLOGY PROCEDURE H&P NOTE   Primary Care Physician: Austine Lefort, MD  HPI: Erin Wong is a 73 y.o. female who presents for Colonoscopy for surveillance of prior adenomas and advanced adenoma.  Past Medical History:  Diagnosis Date   Allergy    Arthritis    Asthma    Atrial fibrillation (HCC)    Benign essential HTN    CHF (congestive heart failure) (HCC)    CKD (chronic kidney disease) stage 3, GFR 30-59 ml/min (HCC)    Diverticulosis    Dysrhythmia     Afib -  had Ablation   GERD (gastroesophageal reflux disease)    HLD (hyperlipidemia)    HTN (hypertension)    Pneumonia    Past Surgical History:  Procedure Laterality Date   ABDOMINAL HYSTERECTOMY  10/04/1995   Hysterectomy and Bilateral oophorectomy   CARDIOVERSION N/A 01/17/2020   Procedure: CARDIOVERSION;  Surgeon: Loyde Rule, MD;  Location: [facility] ENDOSCOPY;  Service: Cardiovascular;  Laterality: N/A;   COLON SURGERY  09/2009   hole in colon   FRACTURE SURGERY Right 08/02/2011   Right Leg--Plates & Screws   HAND SURGERY Right 09/02/2006   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--Arthritis   HARDWARE REMOVAL Right 12/04/2020   Procedure: rigth knee hardware removal;  Surgeon: Wes Hamman, MD;  Location: [facility] OR;  Service: Orthopedics;  Laterality: Right;   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   at time of colon surgery   TEE WITHOUT CARDIOVERSION N/A 01/17/2020   Procedure: TRANSESOPHAGEAL ECHOCARDIOGRAM (TEE);  Surgeon: Loyde Rule, MD;  Location: [facility] ENDOSCOPY;  Service: Cardiovascular;  Laterality: N/A;   TOTAL KNEE ARTHROPLASTY Right 02/19/2021   Procedure: RIGHT TOTAL KNEE ARTHROPLASTY;  Surgeon: Wes Hamman, MD;  Location: [facility] OR;  Service: Orthopedics;  Laterality: Right;   Current Outpatient Medications  Medication Sig Dispense Refill   amiodarone  (PACERONE ) 200 MG tablet TAKE 1/2 TABLET BY MOUTH EVERY DAY. 45 tablet 3   atorvastatin  (LIPITOR) 40 MG tablet Take 1 tablet (40 mg total) by mouth daily. 90  tablet 3   Biotin 5000 MCG TABS Take 5,000 mcg by mouth daily.     carvedilol  (COREG ) 3.125 MG tablet Take 1 tablet (3.125 mg total) by mouth 2 (two) times daily. 180 tablet 3   Cholecalciferol  25 MCG (1000 UT) tablet Take 1 tablet (1,000 Units total) by mouth daily. 90 tablet 1   Cyanocobalamin (VITAMIN B-12 PO) Take by mouth.     fluticasone -salmeterol (WIXELA INHUB) 100-50 MCG/ACT AEPB Inhale 1 puff into the lungs 2 (two) times daily. 180 each 2   furosemide  (LASIX ) 40 MG tablet Take 1 tablet (40 mg total) by mouth daily. 90 tablet 3   traMADol  (ULTRAM ) 50 MG tablet Take 2 tablets (100 mg total) by mouth every 6 (six) hours as needed. 180 tablet 3   albuterol  (VENTOLIN  HFA) 108 (90 Base) MCG/ACT inhaler Inhale 1 puff into the lungs every 6 (six) hours as needed for wheezing or shortness of breath. 18 g 3   apixaban  (ELIQUIS ) 5 MG TABS tablet Take 1 tablet (5 mg total) by mouth 2 (two) times daily. 180 tablet 3   CALCIUM  GLUCONATE PO Take 600 mg by mouth daily. (Patient not taking: Reported on 12/16/2023)     fluticasone  (FLONASE ) 50 MCG/ACT nasal spray Place 2 sprays into both nostrils daily as needed for allergies. 16 g 3   Misc Natural Products (NEURIVA PO) Take by mouth.     polyethylene glycol (MIRALAX  / GLYCOLAX ) 17  g packet Take 17 g by mouth daily.     Current Facility-Administered Medications  Medication Dose Route Frequency Provider Last Rate Last Admin   0.9 %  sodium chloride  infusion  500 mL Intravenous Once Mansouraty, Reginna Sermeno Jr., MD        Current Outpatient Medications:    amiodarone  (PACERONE ) 200 MG tablet, TAKE 1/2 TABLET BY MOUTH EVERY DAY., Disp: 45 tablet, Rfl: 3   atorvastatin  (LIPITOR) 40 MG tablet, Take 1 tablet (40 mg total) by mouth daily., Disp: 90 tablet, Rfl: 3   Biotin 5000 MCG TABS, Take 5,000 mcg by mouth daily., Disp: , Rfl:    carvedilol  (COREG ) 3.125 MG tablet, Take 1 tablet (3.125 mg total) by mouth 2 (two) times daily., Disp: 180 tablet, Rfl: 3    Cholecalciferol  25 MCG (1000 UT) tablet, Take 1 tablet (1,000 Units total) by mouth daily., Disp: 90 tablet, Rfl: 1   Cyanocobalamin (VITAMIN B-12 PO), Take by mouth., Disp: , Rfl:    fluticasone -salmeterol (WIXELA INHUB) 100-50 MCG/ACT AEPB, Inhale 1 puff into the lungs 2 (two) times daily., Disp: 180 each, Rfl: 2   furosemide  (LASIX ) 40 MG tablet, Take 1 tablet (40 mg total) by mouth daily., Disp: 90 tablet, Rfl: 3   traMADol  (ULTRAM ) 50 MG tablet, Take 2 tablets (100 mg total) by mouth every 6 (six) hours as needed., Disp: 180 tablet, Rfl: 3   albuterol  (VENTOLIN  HFA) 108 (90 Base) MCG/ACT inhaler, Inhale 1 puff into the lungs every 6 (six) hours as needed for wheezing or shortness of breath., Disp: 18 g, Rfl: 3   apixaban  (ELIQUIS ) 5 MG TABS tablet, Take 1 tablet (5 mg total) by mouth 2 (two) times daily., Disp: 180 tablet, Rfl: 3   CALCIUM  GLUCONATE PO, Take 600 mg by mouth daily. (Patient not taking: Reported on 12/16/2023), Disp: , Rfl:    fluticasone  (FLONASE ) 50 MCG/ACT nasal spray, Place 2 sprays into both nostrils daily as needed for allergies., Disp: 16 g, Rfl: 3   Misc Natural Products (NEURIVA PO), Take by mouth., Disp: , Rfl:    polyethylene glycol (MIRALAX  / GLYCOLAX ) 17 g packet, Take 17 g by mouth daily., Disp: , Rfl:   Current Facility-Administered Medications:    0.9 %  sodium chloride  infusion, 500 mL, Intravenous, Once, Mansouraty, Albino Alu., MD No Known Allergies Family History  Problem Relation Age of Onset   Arthritis Mother    Miscarriages / [place] Mother    Lung cancer Mother        Had quit smoking for 25 years   Esophageal cancer Mother    Alcohol abuse Father    Breast cancer Sister    Alcohol abuse Brother    Kidney cancer Brother    COPD Brother    Asthma Maternal Aunt    Diabetes Maternal Aunt    Colon cancer Neg Hx    Inflammatory bowel disease Neg Hx    Liver disease Neg Hx    Pancreatic cancer Neg Hx    Rectal cancer Neg Hx    Stomach cancer  Neg Hx    Colon polyps Neg Hx    Social History   Socioeconomic History   Marital status: Married    Spouse name: Not on file   Number of children: 3   Years of education: Not on file   Highest education level: Not on file  Occupational History   Occupation: retired  Tobacco Use   Smoking status: Former    Current packs/day: 0.00  Types: Cigarettes    Quit date: 01/10/1991    Years since quitting: 33.0   Smokeless tobacco: Never  Vaping Use   Vaping status: Never Used  Substance and Sexual Activity   Alcohol use: Not Currently    Comment: quit in May/2021   Drug use: No   Sexual activity: Never  Other Topics Concern   Not on file  Social History Narrative   Not on file   Social Drivers of Health   Financial Resource Strain: Low Risk  (01/16/2023)   Overall Financial Resource Strain (CARDIA)    Difficulty of Paying Living Expenses: Not hard at all  Food Insecurity: No Food Insecurity (01/16/2023)   Hunger Vital Sign    Worried About Running Out of Food in the Last Year: Never true    Ran Out of Food in the Last Year: Never true  Transportation Needs: No Transportation Needs (01/16/2023)   PRAPARE - Administrator, Civil Service (Medical): No    Lack of Transportation (Non-Medical): No  Physical Activity: Insufficiently Active (01/16/2023)   Exercise Vital Sign    Days of Exercise per Week: 3 days    Minutes of Exercise per Session: 30 min  Stress: No Stress Concern Present (01/16/2023)   [employer] of Occupational Health - Occupational Stress Questionnaire    Feeling of Stress : Not at all  Social Connections: Moderately Integrated (01/16/2023)   Social Connection and Isolation Panel [NHANES]    Frequency of Communication with Friends and Family: More than three times a week    Frequency of Social Gatherings with Friends and Family: Three times a week    Attends Religious Services: 1 to 4 times per year    Active Member of Clubs or Organizations:  No    Attends Banker Meetings: Never    Marital Status: Married  Catering manager Violence: Not At Risk (01/16/2023)   Humiliation, Afraid, Rape, and Kick questionnaire    Fear of Current or Ex-Partner: No    Emotionally Abused: No    Physically Abused: No    Sexually Abused: No    Physical Exam: Today's Vitals   01/06/24 0745  BP: (!) 183/80  Pulse: (!) 56  Temp: 97.9 F (36.6 C)  SpO2: 96%  Weight: 156 lb (70.8 kg)  Height: 5\' 3"  (1.6 m)   Body mass index is 27.63 kg/m. GEN: NAD EYE: Sclerae anicteric ENT: MMM CV: Non-tachycardic GI: Soft, NT/ND NEURO:  Alert & Oriented x 3  Lab Results: No results for input(s): "WBC", "HGB", "HCT", "PLT" in the last 72 hours. BMET No results for input(s): "NA", "K", "CL", "CO2", "GLUCOSE", "BUN", "CREATININE", "CALCIUM " in the last 72 hours. LFT No results for input(s): "PROT", "ALBUMIN ", "AST", "ALT", "ALKPHOS", "BILITOT", "BILIDIR", "IBILI" in the last 72 hours. PT/INR No results for input(s): "LABPROT", "INR" in the last 72 hours.   Impression / Plan: This is a 73 y.o.female who presents for Colonoscopy for surveillance of prior adenomas and advanced adenoma.  The risks and benefits of endoscopic evaluation/treatment were discussed with the patient and/or family; these include but are not limited to the risk of perforation, infection, bleeding, missed lesions, lack of diagnosis, severe illness requiring hospitalization, as well as anesthesia and sedation related illnesses.  The patient's history has been reviewed, patient examined, no change in status, and deemed stable for procedure.  The patient and/or family is agreeable to proceed.    Yong Henle, MD [facility] Advanced Endoscopy Office # [phone]

## 2024-01-06 NOTE — Progress Notes (Signed)
 Called to room to assist during endoscopic procedure.  Patient ID and intended procedure confirmed with present staff. Received instructions for my participation in the procedure from the performing physician.

## 2024-01-07 ENCOUNTER — Encounter (HOSPITAL_COMMUNITY)
Admission: RE | Admit: 2024-01-07 | Discharge: 2024-01-07 | Disposition: A | Discharge status: Home / Self Care | Payer: Medicare Other | Source: Ambulatory Visit | Attending: Cardiovascular Disease | Admitting: Cardiovascular Disease

## 2024-01-07 ENCOUNTER — Telehealth: Payer: Self-pay

## 2024-01-07 DIAGNOSIS — E7800 Pure hypercholesterolemia, unspecified: Secondary | ICD-10-CM | POA: Diagnosis not present

## 2024-01-07 DIAGNOSIS — I10 Essential (primary) hypertension: Secondary | ICD-10-CM | POA: Insufficient documentation

## 2024-01-07 DIAGNOSIS — I25118 Atherosclerotic heart disease of native coronary artery with other forms of angina pectoris: Secondary | ICD-10-CM | POA: Diagnosis not present

## 2024-01-07 LAB — NM PET CT CARDIAC PERFUSION MULTI W/ABSOLUTE BLOODFLOW
MBFR: 2.28
Nuc Rest EF: 60 %
Nuc Stress EF: 63 %
Rest MBF: 0.64 ml/g/min
Rest Nuclear Isotope Dose: 18.4 mCi
ST Depression (mm): 0 mm
Stress MBF: 1.46 ml/g/min
Stress Nuclear Isotope Dose: 18.4 mCi
TID: 1.04

## 2024-01-07 MED ORDER — RUBIDIUM RB82 GENERATOR (RUBYFILL)
18.3500 | PACK | Freq: Once | INTRAVENOUS | Status: AC
  Administered 2024-01-07: 18.35 via INTRAVENOUS

## 2024-01-07 MED ORDER — REGADENOSON 0.4 MG/5ML IV SOLN
0.4000 mg | Freq: Once | INTRAVENOUS | Status: AC
  Administered 2024-01-07: 0.4 mg via INTRAVENOUS

## 2024-01-07 MED ORDER — REGADENOSON 0.4 MG/5ML IV SOLN
INTRAVENOUS | Status: AC
  Filled 2024-01-07: qty 5

## 2024-01-07 MED ORDER — RUBIDIUM RB82 GENERATOR (RUBYFILL)
18.4100 | PACK | Freq: Once | INTRAVENOUS | Status: AC
  Administered 2024-01-07: 18.41 via INTRAVENOUS

## 2024-01-07 NOTE — Telephone Encounter (Signed)
  Follow up Call-     01/06/2024    7:46 AM  Call back number  Post procedure Call Back phone  # [phone]  Permission to leave phone message Yes     Patient questions:  Do you have a fever, pain , or abdominal swelling? No. Pain Score  0 *  Have you tolerated food without any problems? Yes.    Have you been able to return to your normal activities? Yes.    Do you have any questions about your discharge instructions: Diet   No. Medications  No. Follow up visit  No.  Do you have questions or concerns about your Care? No.  Actions: * If pain score is 4 or above: No action needed, pain <4.

## 2024-01-08 LAB — SURGICAL PATHOLOGY

## 2024-01-11 ENCOUNTER — Encounter: Payer: Self-pay | Admitting: Gastroenterology

## 2024-01-19 ENCOUNTER — Other Ambulatory Visit

## 2024-01-19 ENCOUNTER — Observation Stay (HOSPITAL_BASED_OUTPATIENT_CLINIC_OR_DEPARTMENT_OTHER)
Admission: EM | Admit: 2024-01-19 | Discharge: 2024-01-20 | Disposition: A | Discharge status: Home / Self Care | Attending: Internal Medicine | Admitting: Internal Medicine

## 2024-01-19 ENCOUNTER — Other Ambulatory Visit: Payer: Self-pay

## 2024-01-19 ENCOUNTER — Emergency Department (HOSPITAL_BASED_OUTPATIENT_CLINIC_OR_DEPARTMENT_OTHER)

## 2024-01-19 DIAGNOSIS — I130 Hypertensive heart and chronic kidney disease with heart failure and stage 1 through stage 4 chronic kidney disease, or unspecified chronic kidney disease: Secondary | ICD-10-CM | POA: Insufficient documentation

## 2024-01-19 DIAGNOSIS — R0789 Other chest pain: Secondary | ICD-10-CM | POA: Diagnosis not present

## 2024-01-19 DIAGNOSIS — Z96651 Presence of right artificial knee joint: Secondary | ICD-10-CM | POA: Insufficient documentation

## 2024-01-19 DIAGNOSIS — Z87891 Personal history of nicotine dependence: Secondary | ICD-10-CM | POA: Insufficient documentation

## 2024-01-19 DIAGNOSIS — R079 Chest pain, unspecified: Principal | ICD-10-CM | POA: Insufficient documentation

## 2024-01-19 DIAGNOSIS — Z79899 Other long term (current) drug therapy: Secondary | ICD-10-CM | POA: Insufficient documentation

## 2024-01-19 DIAGNOSIS — I5022 Chronic systolic (congestive) heart failure: Secondary | ICD-10-CM | POA: Diagnosis not present

## 2024-01-19 DIAGNOSIS — J45909 Unspecified asthma, uncomplicated: Secondary | ICD-10-CM | POA: Insufficient documentation

## 2024-01-19 DIAGNOSIS — R0600 Dyspnea, unspecified: Secondary | ICD-10-CM | POA: Diagnosis not present

## 2024-01-19 DIAGNOSIS — I480 Paroxysmal atrial fibrillation: Secondary | ICD-10-CM | POA: Insufficient documentation

## 2024-01-19 DIAGNOSIS — E785 Hyperlipidemia, unspecified: Secondary | ICD-10-CM | POA: Diagnosis not present

## 2024-01-19 DIAGNOSIS — N183 Chronic kidney disease, stage 3 unspecified: Secondary | ICD-10-CM | POA: Diagnosis not present

## 2024-01-19 DIAGNOSIS — I4892 Unspecified atrial flutter: Secondary | ICD-10-CM | POA: Insufficient documentation

## 2024-01-19 DIAGNOSIS — Z7901 Long term (current) use of anticoagulants: Secondary | ICD-10-CM | POA: Insufficient documentation

## 2024-01-19 DIAGNOSIS — I771 Stricture of artery: Secondary | ICD-10-CM | POA: Diagnosis not present

## 2024-01-19 DIAGNOSIS — I700 Atherosclerosis of aorta: Secondary | ICD-10-CM | POA: Diagnosis not present

## 2024-01-19 LAB — BASIC METABOLIC PANEL WITH GFR
Anion gap: 15 (ref 5–15)
BUN: 15 mg/dL (ref 8–23)
CO2: 26 mmol/L (ref 22–32)
Calcium: 10.6 mg/dL — ABNORMAL HIGH (ref 8.9–10.3)
Chloride: 98 mmol/L (ref 98–111)
Creatinine, Ser: 1.3 mg/dL — ABNORMAL HIGH (ref 0.44–1.00)
GFR, Estimated: 43 mL/min — ABNORMAL LOW (ref >60)
Glucose, Bld: 126 mg/dL — ABNORMAL HIGH (ref 70–99)
Potassium: 4 mmol/L (ref 3.5–5.1)
Sodium: 140 mmol/L (ref 135–145)

## 2024-01-19 LAB — CBC
HCT: 44.7 % (ref 36.0–46.0)
Hemoglobin: 14.6 g/dL (ref 12.0–15.0)
MCH: 31.2 pg (ref 26.0–34.0)
MCHC: 32.7 g/dL (ref 30.0–36.0)
MCV: 95.5 fL (ref 80.0–100.0)
Platelets: 449 10*3/uL — ABNORMAL HIGH (ref 150–400)
RBC: 4.68 MIL/uL (ref 3.87–5.11)
RDW: 13.8 % (ref 11.5–15.5)
WBC: 9.2 10*3/uL (ref 4.0–10.5)
nRBC: 0 % (ref 0.0–0.2)

## 2024-01-19 LAB — TROPONIN T, HIGH SENSITIVITY
Troponin T High Sensitivity: 29 ng/L — ABNORMAL HIGH (ref <19)
Troponin T High Sensitivity: 28 ng/L — ABNORMAL HIGH (ref <19)

## 2024-01-19 LAB — PRO BRAIN NATRIURETIC PEPTIDE: Pro Brain Natriuretic Peptide: 3003 pg/mL — ABNORMAL HIGH (ref <300.0)

## 2024-01-19 MED ORDER — ONDANSETRON HCL 4 MG/2ML IJ SOLN
4.0000 mg | Freq: Four times a day (QID) | INTRAMUSCULAR | Status: DC | PRN
Start: 2024-01-19 — End: 2024-01-20

## 2024-01-19 MED ORDER — MELATONIN 3 MG PO TABS
3.0000 mg | ORAL_TABLET | Freq: Every evening | ORAL | Status: DC | PRN
  Administered 2024-01-19: 3 mg via ORAL
  Filled 2024-01-19: qty 1

## 2024-01-19 MED ORDER — TRAMADOL HCL 50 MG PO TABS
50.0000 mg | ORAL_TABLET | Freq: Three times a day (TID) | ORAL | Status: DC
  Administered 2024-01-19 – 2024-01-20 (×2): 50 mg via ORAL
  Filled 2024-01-19 (×2): qty 1

## 2024-01-19 MED ORDER — SENNOSIDES-DOCUSATE SODIUM 8.6-50 MG PO TABS: 1.0000 | ORAL_TABLET | Freq: Every evening | ORAL | Status: DC | PRN

## 2024-01-19 MED ORDER — MORPHINE SULFATE (PF) 4 MG/ML IV SOLN: 4.0000 mg | INTRAVENOUS | Status: DC | PRN

## 2024-01-19 MED ORDER — NITROGLYCERIN 2 % TD OINT
1.0000 [in_us] | TOPICAL_OINTMENT | Freq: Four times a day (QID) | TRANSDERMAL | Status: AC
  Administered 2024-01-19 – 2024-01-20 (×2): 1 [in_us] via TOPICAL
  Filled 2024-01-19: qty 1

## 2024-01-19 MED ORDER — ONDANSETRON HCL 4 MG PO TABS
4.0000 mg | ORAL_TABLET | Freq: Four times a day (QID) | ORAL | Status: DC | PRN
Start: 2024-01-19 — End: 2024-01-20

## 2024-01-19 MED ORDER — NITROGLYCERIN 0.4 MG SL SUBL
SUBLINGUAL_TABLET | SUBLINGUAL | Status: AC
Start: 2024-01-19 — End: 2024-01-19
  Administered 2024-01-19: 0.4 mg
  Filled 2024-01-19: qty 1

## 2024-01-19 MED ORDER — NITROGLYCERIN 0.4 MG SL SUBL
SUBLINGUAL_TABLET | SUBLINGUAL | Status: AC
  Administered 2024-01-19: 0.4 mg via SUBLINGUAL
  Filled 2024-01-19: qty 1

## 2024-01-19 MED ORDER — FUROSEMIDE 40 MG PO TABS
40.0000 mg | ORAL_TABLET | Freq: Every day | ORAL | Status: DC
  Administered 2024-01-20: 40 mg via ORAL
  Filled 2024-01-19: qty 1

## 2024-01-19 MED ORDER — FLUTICASONE FUROATE-VILANTEROL 100-25 MCG/ACT IN AEPB
1.0000 | INHALATION_SPRAY | Freq: Every day | RESPIRATORY_TRACT | Status: DC
  Administered 2024-01-20: 1 via RESPIRATORY_TRACT
  Filled 2024-01-19: qty 28

## 2024-01-19 MED ORDER — FUROSEMIDE 10 MG/ML IJ SOLN
40.0000 mg | Freq: Once | INTRAMUSCULAR | Status: AC
  Administered 2024-01-19: 40 mg via INTRAVENOUS
  Filled 2024-01-19: qty 4

## 2024-01-19 MED ORDER — ALUM & MAG HYDROXIDE-SIMETH 200-200-20 MG/5ML PO SUSP
30.0000 mL | Freq: Once | ORAL | Status: AC
  Administered 2024-01-19: 30 mL via ORAL
  Filled 2024-01-19: qty 30

## 2024-01-19 MED ORDER — NITROGLYCERIN 0.4 MG SL SUBL
0.4000 mg | SUBLINGUAL_TABLET | SUBLINGUAL | Status: AC | PRN
  Administered 2024-01-19 (×3): 0.4 mg via SUBLINGUAL

## 2024-01-19 MED ORDER — ATORVASTATIN CALCIUM 40 MG PO TABS
40.0000 mg | ORAL_TABLET | Freq: Every day | ORAL | Status: DC
  Administered 2024-01-20: 40 mg via ORAL
  Filled 2024-01-19: qty 1

## 2024-01-19 MED ORDER — MORPHINE SULFATE (PF) 2 MG/ML IV SOLN
2.0000 mg | INTRAVENOUS | Status: DC | PRN
  Administered 2024-01-20: 2 mg via INTRAVENOUS
  Filled 2024-01-19: qty 1

## 2024-01-19 MED ORDER — ACETAMINOPHEN 325 MG PO TABS: 650.0000 mg | ORAL_TABLET | Freq: Four times a day (QID) | ORAL | Status: DC | PRN

## 2024-01-19 MED ORDER — ACETAMINOPHEN 650 MG RE SUPP: 650.0000 mg | Freq: Four times a day (QID) | RECTAL | Status: DC | PRN

## 2024-01-19 MED ORDER — METOPROLOL TARTRATE 5 MG/5ML IV SOLN
INTRAVENOUS | Status: AC
  Filled 2024-01-19: qty 5

## 2024-01-19 MED ORDER — ASPIRIN 81 MG PO CHEW
324.0000 mg | CHEWABLE_TABLET | Freq: Once | ORAL | Status: AC
  Administered 2024-01-19: 324 mg via ORAL
  Filled 2024-01-19: qty 4

## 2024-01-19 MED ORDER — POLYETHYLENE GLYCOL 3350 17 G PO PACK: 17.0000 g | PACK | Freq: Every day | ORAL | Status: DC

## 2024-01-19 MED ORDER — CARVEDILOL 3.125 MG PO TABS
3.1250 mg | ORAL_TABLET | Freq: Two times a day (BID) | ORAL | Status: DC
  Administered 2024-01-19 – 2024-01-20 (×2): 3.125 mg via ORAL
  Filled 2024-01-19 (×2): qty 1

## 2024-01-19 MED ORDER — AMIODARONE HCL 200 MG PO TABS
100.0000 mg | ORAL_TABLET | Freq: Two times a day (BID) | ORAL | Status: DC
  Administered 2024-01-19 – 2024-01-20 (×2): 100 mg via ORAL
  Filled 2024-01-19 (×2): qty 1

## 2024-01-19 MED ORDER — APIXABAN 5 MG PO TABS
5.0000 mg | ORAL_TABLET | Freq: Two times a day (BID) | ORAL | Status: DC
  Administered 2024-01-19 – 2024-01-20 (×2): 5 mg via ORAL
  Filled 2024-01-19 (×2): qty 1

## 2024-01-19 MED ORDER — METOPROLOL TARTRATE 5 MG/5ML IV SOLN
2.5000 mg | Freq: Once | INTRAVENOUS | Status: AC
Start: 2024-01-19 — End: 2024-01-19
  Administered 2024-01-19: 2.5 mg via INTRAVENOUS

## 2024-01-19 NOTE — Progress Notes (Signed)
 Hospitalist Transfer Note:    Nursing staff, Please call [facility] Admits & Consults System-Wide number on Amion [phone]) as soon as patient's arrival, so appropriate admitting provider can evaluate the pt.   Transferring facility: [facility] Requesting provider: Lyna Sandhoff, PA (EDP at [facility]) Reason for transfer: admission for further evaluation and management of chest pain.    74 F w/ h/o paroxysmal atrial flutter/fibrillation chronically anticoagulated on Eliquis , who presented to [facility] ED complaining of chest pain.   The patient awoke around 6 AM on the morning of 01/19/2024 with new onset substernal chest discomfort, radiating to both shoulders, associated diaphoresis.  Pain has remained constant since onset, but is improved significantly with a single sublingual nitroglycerin  administered at [facility] this evening. Pt conveys that she has never previously experienced similar chest discomfort.  She has no known history of coronary artery disease, and is reported to have had a reassuring calcium  score on evaluation within the last few years.  At [facility] today, she is in atrial flutter with sustained heart rates in the 1 teens; systolic blood pressures are reported to be in the 130s to 140s mmHg. in addition to being chronically anticoagulated on Eliquis , she is also reported to be on amiodarone  as an outpatient.  Labs were notable for initial high-sensitivity troponin I of 29, with repeat value trending down to 28.  EKG is reported to show atrial fibrillation with heart rate in the 1 teens, and nonspecific ST changes, in the absence of any evidence of ST elevation.  Imaging notable for chest x-ray, which shows no evidence of acute cardiopulmonary process  Medications administered prior to transfer included the following: Sublingual nitroglycerin  x 1, full dose aspirin  x 1.  EDP at [facility] d/w on-call cardiology, Dr. Renna Cary, who conveyed that cardiology will formally consult and will see pt in the AM.    Subsequently, I accepted this patient for transfer for observation to a cardiac-tele bed at [facility] for further work-up and management of the above.     Camelia Cavalier, DO Hospitalist

## 2024-01-19 NOTE — ED Provider Notes (Signed)
 [place] EMERGENCY DEPARTMENT AT [facility] Provider Note   CSN: [id number] Arrival date & time: 01/19/24  1558     History  Chief Complaint  Patient presents with   Chest Pain    Erin Wong is a 73 y.o. female.  Patient with history of chronic kidney disease, atrial fibrillation on Eliquis  and amiodarone , GERD --presents to the emergency department today for evaluation of chest pain.  Patient reports being in a car show yesterday and she had barbecue.  Does not usually sit well in her stomach.  She felt little nauseous last night but developed mid chest pain that radiated to the upper chest bilaterally around 6 AM.  Patient had associated shortness of breath.  Maximum pain was 9 out of 10 today.  Prior to arrival patient developed diaphoresis which prompted emergency department evaluation.  Patient had nausea without vomiting.  She tried taking Tums without improvement early this morning.  She also took cold medicine in case she was developing a "chest cold".  No lower extremity symptoms.  No abdominal pain.       Home Medications Prior to Admission medications   Medication Sig Start Date End Date Taking? Authorizing Provider  albuterol  (VENTOLIN  HFA) 108 (90 Base) MCG/ACT inhaler Inhale 1 puff into the lungs every 6 (six) hours as needed for wheezing or shortness of breath. 01/01/21   Wilhemena Harbour, NP  amiodarone  (PACERONE ) 200 MG tablet TAKE 1/2 TABLET BY MOUTH EVERY DAY. 10/17/23   Nishan, Peter C, MD  apixaban  (ELIQUIS ) 5 MG TABS tablet Take 1 tablet (5 mg total) by mouth 2 (two) times daily. 10/17/23   Loyde Rule, MD  atorvastatin  (LIPITOR) 40 MG tablet Take 1 tablet (40 mg total) by mouth daily. 10/17/23   Nishan, Peter C, MD  Biotin 5000 MCG TABS Take 5,000 mcg by mouth daily.    [provider]  CALCIUM  GLUCONATE PO Take 600 mg by mouth daily. Patient not taking: Reported on 12/16/2023    [provider]  carvedilol  (COREG ) 3.125 MG  tablet Take 1 tablet (3.125 mg total) by mouth 2 (two) times daily. 10/17/23   Nishan, Peter C, MD  Cholecalciferol  25 MCG (1000 UT) tablet Take 1 tablet (1,000 Units total) by mouth daily. 01/01/21   Wilhemena Harbour, NP  Cyanocobalamin (VITAMIN B-12 PO) Take by mouth.    [provider]  fluticasone  (FLONASE ) 50 MCG/ACT nasal spray Place 2 sprays into both nostrils daily as needed for allergies. 01/01/21   Wilhemena Harbour, NP  fluticasone -salmeterol (WIXELA INHUB) 100-50 MCG/ACT AEPB Inhale 1 puff into the lungs 2 (two) times daily. 09/16/23   Austine Lefort, MD  furosemide  (LASIX ) 40 MG tablet Take 1 tablet (40 mg total) by mouth daily. 10/17/23   Nishan, Peter C, MD  Misc Natural Products (NEURIVA PO) Take by mouth.    [provider]  polyethylene glycol (MIRALAX  / GLYCOLAX ) 17 g packet Take 17 g by mouth daily.    [provider]  traMADol  (ULTRAM ) 50 MG tablet Take 2 tablets (100 mg total) by mouth every 6 (six) hours as needed. 12/18/23   Austine Lefort, MD      Allergies    Patient has no known allergies.    Review of Systems   Review of Systems  Physical Exam Updated Vital Signs BP (!) 130/95 (BP Location: Left Arm)   Pulse (!) 114   Temp 97.7 F (36.5 C)   Resp 20  SpO2 98%  Physical Exam Vitals and nursing note reviewed.  Constitutional:      Appearance: She is well-developed. She is not diaphoretic.  HENT:     Head: Normocephalic and atraumatic.     Mouth/Throat:     Mouth: Mucous membranes are not dry.  Eyes:     Conjunctiva/sclera: Conjunctivae normal.  Neck:     Vascular: Normal carotid pulses. No JVD.     Trachea: Trachea normal. No tracheal deviation.  Cardiovascular:     Rate and Rhythm: Regular rhythm. Tachycardia present.     Pulses: No decreased pulses.          Radial pulses are 2+ on the right side and 2+ on the left side.     Heart sounds: Normal heart sounds, S1 normal and S2 normal. No murmur heard. Pulmonary:      Effort: Pulmonary effort is normal. No respiratory distress.     Breath sounds: No decreased breath sounds, wheezing or rhonchi.  Chest:     Chest wall: No tenderness.  Abdominal:     General: Bowel sounds are normal.     Palpations: Abdomen is soft.     Tenderness: There is no abdominal tenderness. There is no guarding or rebound.  Musculoskeletal:        General: Normal range of motion.     Cervical back: Normal range of motion and neck supple. No muscular tenderness.  Skin:    General: Skin is warm and dry.     Coloration: Skin is not pale.  Neurological:     Mental Status: She is alert.     ED Results / Procedures / Treatments   Labs (all labs ordered are listed, but only abnormal results are displayed) Labs Reviewed  BASIC METABOLIC PANEL WITH GFR - Abnormal; Notable for the following components:      Result Value   Glucose, Bld 126 (*)    Creatinine, Ser 1.30 (*)    Calcium  10.6 (*)    GFR, Estimated 43 (*)    All other components within normal limits  CBC - Abnormal; Notable for the following components:   Platelets 449 (*)    All other components within normal limits  PRO BRAIN NATRIURETIC PEPTIDE - Abnormal; Notable for the following components:   Pro Brain Natriuretic Peptide 3,003.0 (*)    All other components within normal limits  TROPONIN T, HIGH SENSITIVITY - Abnormal; Notable for the following components:   Troponin T High Sensitivity 29 (*)    All other components within normal limits  TROPONIN T, HIGH SENSITIVITY - Abnormal; Notable for the following components:   Troponin T High Sensitivity 28 (*)    All other components within normal limits    EKG EKG Interpretation Date/Time:  Monday Jan 19 2024 16:04:14 EDT Ventricular Rate:  114 PR Interval:    QRS Duration:  90 QT Interval:  328 QTC Calculation: 452 R Axis:   31  Text Interpretation: Atrial flutter with 2:1 A-V conduction Low voltage QRS Nonspecific ST and T wave abnormality Abnormal ECG  When compared with ECG of 17-Oct-2023 09:45, Atrial flutter has replaced Sinus rhythm Vent. rate has increased BY  59 BPM QRS duration has decreased ST now depressed in Inferior leads ST now depressed in Anterolateral leads Confirmed by Rosealee Concha ([id number]) on 01/19/2024 4:49:20 PM  Radiology DG Chest Portable 1 View Result Date: 01/19/2024 CLINICAL DATA:  CP and dyspnea EXAM: PORTABLE CHEST - 1 VIEW COMPARISON:  Jan 16, 2021 FINDINGS:  Similar blunting of the left costophrenic sulcus, likely a small amount of pleural thickening. No focal airspace consolidation, pleural effusion, or pneumothorax. No cardiomegaly. Tortuous aorta with aortic atherosclerosis. No acute fracture or destructive lesion. Multilevel thoracic osteophytosis. IMPRESSION: No acute cardiopulmonary abnormality. Electronically Signed   By: Rance Burrows M.D.   On: 01/19/2024 18:40    Procedures Procedures    Medications Ordered in ED Medications  nitroGLYCERIN  (NITROSTAT ) SL tablet 0.4 mg (0.4 mg Sublingual Given 01/19/24 1801)  metoprolol  tartrate (LOPRESSOR ) injection 2.5 mg (has no administration in time range)  aspirin  chewable tablet 324 mg (324 mg Oral Given 01/19/24 1708)    ED Course/ Medical Decision Making/ A&P    Patient seen and examined. History obtained directly from patient.  Reviewed previous cardiology notes, reviewed recent nuclear stress test.  Labs/EKG: Ordered CBC, BMP, troponin.  EKG personally reviewed and interpreted.  Imaging: Ordered chest x-ray.  Medications/Fluids: Ordered: 324 aspirin , nitroglycerin   Most recent vital signs reviewed and are as follows: BP (!) 130/95 (BP Location: Left Arm)   Pulse (!) 114   Temp 97.7 F (36.5 C)   Resp 20   SpO2 98%   Initial impression: Chest pain, some typical symptoms.  Will need to evaluate for ACS.  EKG with changes.  Discussed case and reviewed EKG with Dr. Adrain Alar.   5:46 PM Reassessment performed. Patient appears stable. Just rechecked  shortly after NTG. Pain is slightly better with that. Not resolved.   Labs personally reviewed and interpreted including: CBC platelets 449 otherwise unremarkable; BMP glucose 126, creatinine mildly elevated at 1.3 with a BUN of 15, calcium  mildly elevated at 10.6; proBNP 3000 elevated; first troponin elevated at 29.  Imaging personally visualized and interpreted including: Chest x-ray, awaiting read, no evidence of significant edema or infiltrate.  Reviewed pertinent lab work and imaging with patient at bedside. Questions answered.   Most current vital signs reviewed and are as follows: BP (!) 130/95 (BP Location: Left Arm)   Pulse (!) 114   Temp 97.7 F (36.5 C)   Resp 20   SpO2 98%   Plan: Will discuss with cardiology.  6:06 PM Consulted with Dr. Renna Cary with cardiology by telephone. Discussed findings and history to this point. Would opt for observation for CP and cardiology consult in the morning. Would request hospitalist admit.   6:51 PM Reassessment performed. Patient appears stable.  Reports pain down to 3 out of 10.  Labs personally reviewed and interpreted including: Second troponin flat 29 > 28.  Reviewed pertinent lab work and imaging with patient at bedside. Questions answered.   Most current vital signs reviewed and are as follows: BP (!) 130/99   Pulse (!) 115   Temp 97.7 F (36.5 C)   Resp 14   SpO2 95%   Plan: Admit to hospital for observation, cardiology consult in the morning.  7:29 PM Case discussed with Dr. Brock Canner with [facility] who accepts for admission.                                 Medical Decision Making Amount and/or Complexity of Data Reviewed Labs: ordered. Radiology: ordered.  Risk OTC drugs. Prescription drug management. Decision regarding hospitalization.   Patient with chest pain, improved with nitroglycerin  but not resolved.  Heart score equals 7.  Discussed with cardiology.  Plan for admission for observation, cardiology  consultation in the morning.  Also atrial flutter with RVR, heart  rate 110-120.        Final Clinical Impression(s) / ED Diagnoses Final diagnoses:  Chest pain, unspecified type  Atrial flutter with rapid ventricular response [facility])    Rx / DC Orders ED Discharge Orders     None         Lyna Sandhoff, PA-C 01/19/24 1929    Rosealee Concha, MD 01/19/24 2117

## 2024-01-19 NOTE — H&P (Signed)
 History and Physical    Patient: Erin Wong:[id number] DOB: Jul 13, 1951 DOA: 01/19/2024 DOS: the patient was seen and examined on 01/19/2024 PCP: Austine Lefort, MD  Patient coming from: Home  Chief Complaint:  Chief Complaint  Patient presents with   Chest Pain   HPI: Erin Wong is a 73 y.o. female with medical history significant for paroxysmal atrial flutter/fibrillation who is on Eliquis  presented to the outside emergency department with chest discomfort.  She noticed the pain as soon as she woke up this morning and she has been taking over-the-counter medications which help a little and only occasionally.  She was doing this all morning and afternoon until she became worried and presented to the ER.  The patient reports that last night she was at a car show and had barbecue chicken and slaw.  Her symptoms started a couple hours after returning home.  She tried Tums which did not help. In the emergency department the patient had 9 out of 10 pain.  She was given 4 baby chewable aspirin  and 2 nitroglycerin  and her pain resolved.  The pain returned after she was admitted.  2 nitroglycerin  again took away the pain.  She will be started Nitropaste after that.  Cardiology has been consulted.  Her troponin initially was 29 and went down to 28 a few hours later.  She will be admitted to the hospitalist service.    Review of Systems: As mentioned in the history of present illness. All other systems reviewed and are negative. Past Medical History:  Diagnosis Date   Allergy    Arthritis    Asthma    Atrial fibrillation (HCC)    Benign essential HTN    CHF (congestive heart failure) (HCC)    CKD (chronic kidney disease) stage 3, GFR 30-59 ml/min (HCC)    Diverticulosis    Dysrhythmia     Afib -  had Ablation   GERD (gastroesophageal reflux disease)    HLD (hyperlipidemia)    HTN (hypertension)    Pneumonia    Past Surgical History:  Procedure Laterality Date   ABDOMINAL  HYSTERECTOMY  10/04/1995   Hysterectomy and Bilateral oophorectomy   CARDIOVERSION N/A 01/17/2020   Procedure: CARDIOVERSION;  Surgeon: Loyde Rule, MD;  Location: [facility] ENDOSCOPY;  Service: Cardiovascular;  Laterality: N/A;   COLON SURGERY  09/2009   hole in colon   FRACTURE SURGERY Right 08/02/2011   Right Leg--Plates & Screws   HAND SURGERY Right 09/02/2006   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--Arthritis   HARDWARE REMOVAL Right 12/04/2020   Procedure: rigth knee hardware removal;  Surgeon: Wes Hamman, MD;  Location: [facility] OR;  Service: Orthopedics;  Laterality: Right;   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 Her smoking use included cigarettes. She has never used smokeless tobacco. She reports that she does not currently use alcohol. She reports that she does not use drugs.  No Known Allergies  Family History  Problem Relation Age of Onset   Arthritis Mother    Miscarriages / [place] Mother  Lung cancer Mother        Had quit smoking for 25 years   Esophageal cancer Mother    Alcohol abuse Father    Breast cancer Sister    Alcohol abuse Brother    Kidney cancer Brother    COPD Brother    Asthma Maternal Aunt    Diabetes Maternal Aunt    Colon cancer Neg Hx    Inflammatory bowel disease Neg Hx    Liver disease Neg Hx    Pancreatic cancer Neg Hx    Rectal cancer Neg Hx    Stomach cancer Neg Hx    Colon polyps Neg Hx     Prior to Admission medications   Medication Sig Start Date End Date Taking? Authorizing Provider  albuterol   (VENTOLIN  HFA) 108 (90 Base) MCG/ACT inhaler Inhale 1 puff into the lungs every 6 (six) hours as needed for wheezing or shortness of breath. 01/01/21   Wilhemena Harbour, NP  amiodarone  (PACERONE ) 200 MG tablet TAKE 1/2 TABLET BY MOUTH EVERY DAY. 10/17/23   Nishan, Peter C, MD  apixaban  (ELIQUIS ) 5 MG TABS tablet Take 1 tablet (5 mg total) by mouth 2 (two) times daily. 10/17/23   Loyde Rule, MD  atorvastatin  (LIPITOR) 40 MG tablet Take 1 tablet (40 mg total) by mouth daily. 10/17/23   Nishan, Peter C, MD  Biotin 5000 MCG TABS Take 5,000 mcg by mouth daily.    [provider]  CALCIUM  GLUCONATE PO Take 600 mg by mouth daily. Patient not taking: Reported on 12/16/2023    [provider]  carvedilol  (COREG ) 3.125 MG tablet Take 1 tablet (3.125 mg total) by mouth 2 (two) times daily. 10/17/23   Nishan, Peter C, MD  Cholecalciferol  25 MCG (1000 UT) tablet Take 1 tablet (1,000 Units total) by mouth daily. 01/01/21   Wilhemena Harbour, NP  Cyanocobalamin (VITAMIN B-12 PO) Take by mouth.    [provider]  fluticasone  (FLONASE ) 50 MCG/ACT nasal spray Place 2 sprays into both nostrils daily as needed for allergies. 01/01/21   Wilhemena Harbour, NP  fluticasone -salmeterol (WIXELA INHUB) 100-50 MCG/ACT AEPB Inhale 1 puff into the lungs 2 (two) times daily. 09/16/23   Austine Lefort, MD  furosemide  (LASIX ) 40 MG tablet Take 1 tablet (40 mg total) by mouth daily. 10/17/23   Nishan, Peter C, MD  Misc Natural Products (NEURIVA PO) Take by mouth.    [provider]  polyethylene glycol (MIRALAX  / GLYCOLAX ) 17 g packet Take 17 g by mouth daily.    [provider]  traMADol  (ULTRAM ) 50 MG tablet Take 2 tablets (100 mg total) by mouth every 6 (six) hours as needed. 12/18/23   Austine Lefort, MD    Physical Exam: Vitals:   01/19/24 1845 01/19/24 1856 01/19/24 1930 01/19/24 2103  BP: (!) 130/99 (!) 139/99 (!) 140/98   Pulse: (!) 115  (!) 108   Resp: 14  15 18    Temp:    98.8 F (37.1 C)  TempSrc:    Oral  SpO2: 95%  99%   Weight:    67.9 kg  Height:    5\' 3"  (1.6 m)   Physical Exam:  General: No acute distress, well developed, well nourished HEENT: Normocephalic, atraumatic, PERRL Cardiovascular: Normal rate and rhythm. Distal pulses intact. Pulmonary: Normal pulmonary effort, normal breath sounds Gastrointestinal: Nondistended abdomen, soft, non-tender, normoactive bowel sounds Musculoskeletal:Normal ROM, no lower ext edema Lymphadenopathy: No cervical LAD. Skin: Skin is warm  and dry. Neuro: No focal deficits noted, AAOx3. PSYCH: Attentive and cooperative  Data Reviewed:  Results for orders placed or performed during the hospital encounter of 01/19/24 (from the past 24 hours)  Basic metabolic panel     Status: Abnormal   Collection Time: 01/19/24  4:14 PM  Result Value Ref Range   Sodium 140 135 - 145 mmol/L   Potassium 4.0 3.5 - 5.1 mmol/L   Chloride 98 98 - 111 mmol/L   CO2 26 22 - 32 mmol/L   Glucose, Bld 126 (H) 70 - 99 mg/dL   BUN 15 8 - 23 mg/dL   Creatinine, Ser 1.61 (H) 0.44 - 1.00 mg/dL   Calcium  10.6 (H) 8.9 - 10.3 mg/dL   GFR, Estimated 43 (L) >60 mL/min   Anion gap 15 5 - 15  CBC     Status: Abnormal   Collection Time: 01/19/24  4:14 PM  Result Value Ref Range   WBC 9.2 4.0 - 10.5 K/uL   RBC 4.68 3.87 - 5.11 MIL/uL   Hemoglobin 14.6 12.0 - 15.0 g/dL   HCT 09.6 04.5 - 40.9 %   MCV 95.5 80.0 - 100.0 fL   MCH 31.2 26.0 - 34.0 pg   MCHC 32.7 30.0 - 36.0 g/dL   RDW 81.1 91.4 - 78.2 %   Platelets 449 (H) 150 - 400 K/uL   nRBC 0.0 0.0 - 0.2 %  Troponin T, High Sensitivity     Status: Abnormal   Collection Time: 01/19/24  4:14 PM  Result Value Ref Range   Troponin T High Sensitivity 29 (H) <19 ng/L  Pro Brain natriuretic peptide     Status: Abnormal   Collection Time: 01/19/24  4:55 PM  Result Value Ref Range   Pro Brain Natriuretic Peptide 3,003.0 (H) <300.0 pg/mL  Troponin T, High Sensitivity     Status:  Abnormal   Collection Time: 01/19/24  6:03 PM  Result Value Ref Range   Troponin T High Sensitivity 28 (H) <19 ng/L     Echo 02/2023 1. Left ventricular ejection fraction, by estimation, is 60 to 65%. The  left ventricle has normal function. The left ventricle has no regional  wall motion abnormalities. Left ventricular diastolic parameters are  indeterminate.   2. Right ventricular systolic function is normal. The right ventricular  size is normal. Tricuspid regurgitation signal is inadequate for assessing  PA pressure.   3. The mitral valve is normal in structure. Mild mitral valve  regurgitation. No evidence of mitral stenosis.   4. The aortic valve is tricuspid. Aortic valve regurgitation is mild. No  aortic stenosis is present.   5. The inferior vena cava is normal in size with greater than 50%  respiratory variability, suggesting right atrial pressure of 3 mmHg.    Assessment and Plan: Atypical but recurrent chest pain Atrial fibrillation  - Cardiac monitor - Cycle cardiac enzymes - Continue Eliquis , Asa, statin - No beta-blocker due to bradycardia - Cardiology consulted - NPO after midnight   Advance Care Planning:   Code Status: Prior  The patient names her husband as her surrogate decision maker and she wants to be full code  Consults: Cardiology consulted  Family Communication: Husband at bedside  Severity of Illness: The appropriate patient status for this patient is INPATIENT. Inpatient status is judged to be reasonable and necessary in order to provide the required intensity of service to ensure the patient's safety. The patient's presenting symptoms, physical exam findings, and initial radiographic  and laboratory data in the context of their chronic comorbidities is felt to place them at high risk for further clinical deterioration. Furthermore, it is not anticipated that the patient will be medically stable for discharge from the hospital within 2 midnights of  admission.   * I certify that at the point of admission it is my clinical judgment that the patient will require inpatient hospital care spanning beyond 2 midnights from the point of admission due to high intensity of service, high risk for further deterioration and high frequency of surveillance required.*  Author: Willadean Hark, MD 01/19/2024 9:20 PM  For on call review [email or web address].

## 2024-01-19 NOTE — ED Triage Notes (Signed)
 C/o central CP and SHOB starting today. Hx of Afib and CHF. Takes eliquis .

## 2024-01-20 ENCOUNTER — Inpatient Hospital Stay: Admission: RE | Admit: 2024-01-20 | Source: Ambulatory Visit

## 2024-01-20 DIAGNOSIS — I484 Atypical atrial flutter: Secondary | ICD-10-CM | POA: Diagnosis not present

## 2024-01-20 DIAGNOSIS — I2489 Other forms of acute ischemic heart disease: Secondary | ICD-10-CM

## 2024-01-20 DIAGNOSIS — R079 Chest pain, unspecified: Secondary | ICD-10-CM | POA: Diagnosis not present

## 2024-01-20 DIAGNOSIS — R072 Precordial pain: Secondary | ICD-10-CM

## 2024-01-20 DIAGNOSIS — R9439 Abnormal result of other cardiovascular function study: Secondary | ICD-10-CM | POA: Diagnosis not present

## 2024-01-20 DIAGNOSIS — I480 Paroxysmal atrial fibrillation: Secondary | ICD-10-CM | POA: Diagnosis not present

## 2024-01-20 LAB — CBC
HCT: 40 % (ref 36.0–46.0)
Hemoglobin: 12.8 g/dL (ref 12.0–15.0)
MCH: 30.8 pg (ref 26.0–34.0)
MCHC: 32 g/dL (ref 30.0–36.0)
MCV: 96.2 fL (ref 80.0–100.0)
Platelets: 406 10*3/uL — ABNORMAL HIGH (ref 150–400)
RBC: 4.16 MIL/uL (ref 3.87–5.11)
RDW: 13.8 % (ref 11.5–15.5)
WBC: 10.5 10*3/uL (ref 4.0–10.5)
nRBC: 0 % (ref 0.0–0.2)

## 2024-01-20 LAB — BRAIN NATRIURETIC PEPTIDE: B Natriuretic Peptide: 219.3 pg/mL — ABNORMAL HIGH (ref 0.0–100.0)

## 2024-01-20 LAB — BASIC METABOLIC PANEL WITH GFR
Anion gap: 10 (ref 5–15)
BUN: 17 mg/dL (ref 8–23)
CO2: 30 mmol/L (ref 22–32)
Calcium: 9.2 mg/dL (ref 8.9–10.3)
Chloride: 99 mmol/L (ref 98–111)
Creatinine, Ser: 1.45 mg/dL — ABNORMAL HIGH (ref 0.44–1.00)
GFR, Estimated: 38 mL/min — ABNORMAL LOW (ref >60)
Glucose, Bld: 98 mg/dL (ref 70–99)
Potassium: 3.5 mmol/L (ref 3.5–5.1)
Sodium: 139 mmol/L (ref 135–145)

## 2024-01-20 LAB — TROPONIN I (HIGH SENSITIVITY)
Troponin I (High Sensitivity): 66 ng/L — ABNORMAL HIGH (ref <18)
Troponin I (High Sensitivity): 78 ng/L — ABNORMAL HIGH (ref <18)

## 2024-01-20 LAB — TSH: TSH: 3.265 u[IU]/mL (ref 0.350–4.500)

## 2024-01-20 MED ORDER — TRAMADOL HCL 50 MG PO TABS: 50.0000 mg | ORAL_TABLET | Freq: Three times a day (TID) | ORAL | Status: DC

## 2024-01-20 NOTE — Consult Note (Signed)
 Cardiology Consultation   Patient ID: LATEESHA BEZOLD MRN: [id number]; DOB: 08/06/1951  Admit date: 01/19/2024 Date of Consult: 01/20/2024  PCP:  Austine Lefort, MD   [facility] Providers Cardiologist:  Janelle Mediate, MD   {   Patient Profile:   Erin Wong is a 73 y.o. female with a hx of persistent A fib on Eliquis , NICM/HFrEF with recovered EF, HTN, HLD, asthma, CKD III, pulmonary nodule, remote tobacco use,  who is being seen 01/20/2024 for the evaluation of A fib at the request of Dr Melonie Square.  History of Present Illness:   Ms. Reddin follows Dr Stann Earnest outpatient, has persistent A fib, failed DCCV 01/17/20, converted while on amiodarone  subsequently. She was started on anticoagulation with Eliquis  during hospitalization for A fib RVR 01/2020. She was also found to have acute systolic heart failure with LVEF 30-35% at that time. She did require some IV diuresis and was started on coreg  and Entresto  for GDMT. Subsequent event monitor 04/01/20 showed sinus rhythm /bradycardia, <1% AF burden, and <1% PAC. She had hx of bradycardia with beta blocker and amiodarone , both had been reduced in dosing. Echo repeat on 05/24/20 showed recovered LVEF 60-65%, no RWMA, grade II DD, normal RV and PASP, mild to mod LAE, mild MR, trivial AI. She has been chronically maintained on Eliquis , coreg , and amiodarone  for A fib since 2021.   She last seen by Dr Stann Earnest 10/17/23 in the office, was maintaining sinus rhythm on amiodarone  100mg  daily and coreg  3.125mg  BID, tolerating Eliquis  without bleeding, BP was well controlled. She was arranged for CAD screening due to HLD, CT coronary calcium  score was elevated 538, calcium  noted in LM/LAD. Echo 11/04/23 showed LVEF 60-65%, no RWMA, indeterminate diastolic, normal RV, mild MR and AI. NM PET was completed on 01/07/24 showed very mild lateral ischemia with relative decrease in LCX stress flow. The study is low risk due to small territory and normal global  blood flow reserve. Rest EF: 60%. Stress EF: 63%. Myocardial blood flow was computed to be 0.64ml/g/min at rest and 1.46ml/g/min at stress. Global myocardial blood flow reserve was 2.28 and was normal. Coronary calcifications were present in the left anterior descending artery distribution(s). Aortic atherosclerosis noted. She was recommended no further workup given normal perfusion of LAD (has calcium ).   She presented to [facility] ER yesterday c/o chest pain. She states she started having mid-sternum chest pain Sunday night. It felt like heavy chest pressure. She was diaphoretic. Pain was persistent and nothing helped the pain relief at home. She received some Nitroglycerin  at ER last night, felt pain improved after that. She states pain has resolved since 7pm last night. She states she regularly exercise and this does not triggered any chest pain. She has occasional SOB when she does too much activity. She denied any rapid weight gain, leg edema, orthopnea, PND. She states she has been stressed a lot lately. She states she is taking her medication daily without skipping anything. She denied any fever, chills, emesis, abdominal pain, diarrhea, etc. She states she normally has no symptoms from her A fib, recalls having whole body warm sensation and weakness when she was in A fib in 2021.    She was found in A flutter RVR 110s at ER and was given metoprolol  2.5mg  x1 IV. She was transferred to [facility] subsequently for cardiology consult.   Per ER workup 01/19/24, BMP with Cr 1.3 and GFR 43. ProBNP 3003. Hs trop T 29 >28. CBC with  PLT 449k. CXR showed no acute finding. Labs today showed Cr 1.45 and GFR 38. BNP 219. Hs trop I 66 >78. PLT 406k. TSH WNL.      Past Medical History:  Diagnosis Date   Allergy    Arthritis    Asthma    Atrial fibrillation (HCC)    Benign essential HTN    CHF (congestive heart failure) (HCC)    CKD (chronic kidney disease) stage 3, GFR 30-59 ml/min (HCC)    Diverticulosis     Dysrhythmia     Afib -  had Ablation   GERD (gastroesophageal reflux disease)    HLD (hyperlipidemia)    HTN (hypertension)    Pneumonia     Past Surgical History:  Procedure Laterality Date   ABDOMINAL HYSTERECTOMY  10/04/1995   Hysterectomy and Bilateral oophorectomy   CARDIOVERSION N/A 01/17/2020   Procedure: CARDIOVERSION;  Surgeon: Loyde Rule, MD;  Location: [facility] ENDOSCOPY;  Service: Cardiovascular;  Laterality: N/A;   COLON SURGERY  09/2009   hole in colon   FRACTURE SURGERY Right 08/02/2011   Right Leg--Plates & Screws   HAND SURGERY Right 09/02/2006   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--Arthritis   HARDWARE REMOVAL Right 12/04/2020   Procedure: rigth knee hardware removal;  Surgeon: Wes Hamman, MD;  Location: [facility] OR;  Service: Orthopedics;  Laterality: Right;   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 Authorizing Provider  albuterol  (VENTOLIN  HFA) 108 (90 Base) MCG/ACT inhaler Inhale 1 puff into the lungs every 6 (six) hours as needed for wheezing or shortness of breath. 01/01/21  Yes Thena Fireman A, NP  amiodarone  (PACERONE ) 200 MG tablet TAKE 1/2 TABLET BY MOUTH EVERY DAY. Patient taking differently: Take 100 mg by mouth 2 (two) times daily. 10/17/23  Yes Loyde Rule, MD  apixaban  (ELIQUIS ) 5 MG TABS tablet Take 1 tablet (5 mg total) by mouth 2 (two) times daily. 10/17/23  Yes Loyde Rule, MD  atorvastatin  (LIPITOR) 40 MG tablet Take 1 tablet (40 mg total) by mouth daily. 10/17/23  Yes Loyde Rule, MD  Biotin 5000 MCG TABS Take 5,000 mcg by mouth every evening.   Yes [provider]  carvedilol  (COREG ) 3.125 MG tablet Take 1 tablet (3.125 mg total) by mouth 2 (two) times daily. 10/17/23  Yes Loyde Rule, MD  Cholecalciferol  25 MCG (1000 UT) tablet Take 1 tablet (1,000 Units total) by mouth daily. 01/01/21  Yes Wilhemena Harbour, NP  Cyanocobalamin (VITAMIN B-12 PO) Take by mouth.   Yes [provider]  fluticasone  (FLONASE ) 50 MCG/ACT nasal spray Place 2 sprays into both nostrils daily as needed for allergies. 01/01/21  Yes Wilhemena Harbour, NP  fluticasone -salmeterol (WIXELA INHUB) 100-50 MCG/ACT AEPB Inhale 1 puff into the lungs 2 (two) times daily. 09/16/23  Yes Austine Lefort, MD  furosemide  (LASIX ) 40 MG tablet Take 1 tablet (40 mg total) by mouth daily. 10/17/23  Yes Loyde Rule, MD  Misc Natural Products (NEURIVA PO) Take 1 tablet  by mouth daily.   Yes [provider]  polyethylene glycol (MIRALAX  / GLYCOLAX ) 17 g packet Take 17 g by mouth daily.   Yes [provider]  traMADol  (ULTRAM ) 50 MG tablet Take 2 tablets (100 mg total) by mouth every 6 (six) hours as needed. Patient taking differently: Take 50 mg by mouth 3 (three) times daily. 12/18/23  Yes Austine Lefort, MD  CALCIUM  GLUCONATE PO Take 600 mg by mouth daily. Patient not taking: Reported on 12/16/2023    [provider]    Inpatient Medications: Scheduled Meds:  amiodarone   100 mg Oral BID   apixaban   5 mg Oral BID   atorvastatin   40 mg Oral Daily   carvedilol   3.125 mg Oral BID   fluticasone  furoate-vilanterol  1 puff Inhalation Daily   furosemide   40 mg Oral Daily   polyethylene glycol  17 g Oral Daily   traMADol   50 mg Oral TID   Continuous Infusions:  PRN Meds: acetaminophen  **OR** acetaminophen , melatonin, morphine  injection, ondansetron  **OR** ondansetron  (ZOFRAN ) IV, senna-docusate  Allergies:   No Known Allergies  Social History:   Social  History   Socioeconomic History   Marital status: Married    Spouse name: Not on file   Number of children: 3   Years of education: Not on file   Highest education level: Not on file  Occupational History   Occupation: retired  Tobacco Use   Smoking status: Former    Current packs/day: 0.00    Types: Cigarettes    Quit date: 01/10/1991    Years since quitting: 33.0   Smokeless tobacco: Never  Vaping Use   Vaping status: Never Used  Substance and Sexual Activity   Alcohol use: Not Currently    Comment: quit in May/2021   Drug use: No   Sexual activity: Never  Other Topics Concern   Not on file  Social History Narrative   Not on file   Social Drivers of Health   Financial Resource Strain: Low Risk  (01/16/2023)   Overall Financial Resource Strain (CARDIA)    Difficulty of Paying Living Expenses: Not hard at all  Food Insecurity: No Food Insecurity (01/19/2024)   Hunger Vital Sign    Worried About Running Out of Food in the Last Year: Never true    Ran Out of Food in the Last Year: Never true  Transportation Needs: No Transportation Needs (01/19/2024)   PRAPARE - Administrator, Civil Service (Medical): No    Lack of Transportation (Non-Medical): No  Physical Activity: Insufficiently Active (01/16/2023)   Exercise Vital Sign    Days of Exercise per Week: 3 days    Minutes of Exercise per Session: 30 min  Stress: No Stress Concern Present (01/16/2023)   [employer] of Occupational Health - Occupational Stress Questionnaire    Feeling of Stress : Not at all  Social Connections: Moderately Integrated (01/19/2024)   Social Connection and Isolation Panel [NHANES]    Frequency of Communication with Friends and Family: More than three times a week    Frequency of Social Gatherings with Friends and Family: Three times a week    Attends Religious Services: 1 to 4 times per year    Active Member of Clubs or Organizations: No    Attends Banker  Meetings: Never    Marital Status: Married  Catering manager Violence: Not At Risk (01/19/2024)   Humiliation, Afraid, Rape, and Kick questionnaire    Fear of  Current or Ex-Partner: No    Emotionally Abused: No    Physically Abused: No    Sexually Abused: No    Family History:    Family History  Problem Relation Age of Onset   Arthritis Mother    Miscarriages / [place] Mother    Lung cancer Mother        Had quit smoking for 25 years   Esophageal cancer Mother    Alcohol abuse Father    Breast cancer Sister    Alcohol abuse Brother    Kidney cancer Brother    COPD Brother    Asthma Maternal Aunt    Diabetes Maternal Aunt    Colon cancer Neg Hx    Inflammatory bowel disease Neg Hx    Liver disease Neg Hx    Pancreatic cancer Neg Hx    Rectal cancer Neg Hx    Stomach cancer Neg Hx    Colon polyps Neg Hx      ROS:  Constitutional: Denied fever, chills, malaise, night sweats Eyes: Denied vision change or loss Ears/Nose/Mouth/Throat: Denied ear ache, sore throat, coughing, sinus pain Cardiovascular: see HPI Respiratory: see HPI  Gastrointestinal: Denied nausea, vomiting, abdominal pain, diarrhea Genital/Urinary: Denied dysuria, hematuria, urinary frequency/urgency Musculoskeletal: Denied muscle ache, joint pain, weakness Skin: Denied rash, wound Neuro: Denied headache, dizziness, syncope Psych: Denied history of depression/anxiety  Endocrine: Denied history of diabetes   Physical Exam/Data:   Vitals:   01/20/24 0421 01/20/24 0808 01/20/24 0849 01/20/24 1228  BP: 126/62 130/70  126/69  Pulse: (!) 52 (!) 52 (!) 55 (!) 57  Resp: 18 18 20 18   Temp: 98.5 F (36.9 C) 98.1 F (36.7 C)  98.4 F (36.9 C)  TempSrc: Oral Oral  Oral  SpO2: 100% 98% 95% 94%  Weight:      Height:       No intake or output data in the 24 hours ending 01/20/24 1342    01/19/2024    9:03 PM 01/06/2024    7:45 AM 12/16/2023    9:32 AM  Last 3 Weights  Weight (lbs) 149 lb 11.2 oz 156 lb  156 lb 8 oz  Weight (kg) 67.903 kg 70.761 kg 70.988 kg     Body mass index is 26.52 kg/m.   Vitals:  Vitals:   01/20/24 0849 01/20/24 1228  BP:  126/69  Pulse: (!) 55 (!) 57  Resp: 20 18  Temp:  98.4 F (36.9 C)  SpO2: 95% 94%   General Appearance: In no apparent distress, laying in bed, well nourished  HEENT: Normocephalic, atraumatic. Neck: Supple, trachea midline, no JVDs Cardiovascular: Regular rate and rhythm, normal S1-S2,  no murmur Respiratory: Resting breathing unlabored, lungs sounds clear to auscultation bilaterally, no use of accessory muscles. On room air.  No wheezes, rales or rhonchi.   Gastrointestinal: Bowel sounds positive, abdomen soft, non-tender Extremities: Able to move all extremities in bed without difficulty, no edema of BLE Musculoskeletal: Normal muscle bulk and tone Skin: Intact, warm, dry. No rashes  Neurologic: Alert, oriented to person, place and time. Fluent speech, no cognitive deficit, no gross focal neuro deficit Psychiatric: Normal affect. Mood is appropriate.    EKG:  The EKG was personally reviewed and demonstrates:    EKG from 01/19/24 1604 showed atrial flutter RVR 114bpm, 2:1 AV conduction   EKG from 01/19/24 at 2110 showed atrial flutter RVR 109 bpm, 2:1 AV conduction   Telemetry:  Telemetry was personally reviewed and demonstrates:    ER  stip not available  Atrial flutter RVR 110s, converted to sinus rhythm/bradycardia high 50s  around 2146pm on 01/19/24   Relevant CV Studies:  NM PET 01/07/24:   Findings are consistent with very mild lateral ischemia with relative decrease in LCX stress flow. The study is low risk due to small territory and normal global blood flow reserve.   LV perfusion is abnormal. There is evidence of ischemia. Defect 1: There is a small defect with mild reduction in uptake present in the mid lateral location(s) that is reversible. There is normal wall motion in the defect area. Consistent with ischemia.   Rest  left ventricular function is normal. Rest EF: 60%. Stress left ventricular function is normal. Stress EF: 63%. End diastolic cavity size is normal. End systolic cavity size is normal.   Myocardial blood flow was computed to be 0.64ml/g/min at rest and 1.46ml/g/min at stress. Global myocardial blood flow reserve was 2.28 and was normal.   Coronary calcium  was present on the attenuation correction CT images. Severe coronary calcifications were present. Coronary calcifications were present in the left anterior descending artery distribution(s). Aortic atherosclerosis noted.  Electronically Signed  By: Gloriann Larger  M.D.  IMPRESSION: 1. No acute CT findings of the included chest. 2. Coronary artery disease.   Echo from 11/04/23:  1. Left ventricular ejection fraction, by estimation, is 60 to 65%. The  left ventricle has normal function. The left ventricle has no regional  wall motion abnormalities. Left ventricular diastolic parameters are  indeterminate.   2. Right ventricular systolic function is normal. The right ventricular  size is normal. Tricuspid regurgitation signal is inadequate for assessing  PA pressure.   3. The mitral valve is normal in structure. Mild mitral valve  regurgitation. No evidence of mitral stenosis.   4. The aortic valve is tricuspid. Aortic valve regurgitation is mild. No  aortic stenosis is present.   5. The inferior vena cava is normal in size with greater than 50%  respiratory variability, suggesting right atrial pressure of 3 mmHg.   CT cardiac scoring 10/20/23:  IMPRESSION: Coronary calcium  score of 538. This was 69 th percentile for age and sex matched control.   Given elevated score consider f/u perfusion study if clinically indicated   Event monitor 04/01/20:  SR/SB average HR 51 bpm < 1% burden PAF PAC's < 1% total beats      Laboratory Data:  High Sensitivity Troponin:   Recent Labs  Lab 01/20/24 0004 01/20/24 0558  TROPONINIHS  66* 78*     Chemistry Recent Labs  Lab 01/19/24 1614 01/20/24 0558  NA 140 139  K 4.0 3.5  CL 98 99  CO2 26 30  GLUCOSE 126* 98  BUN 15 17  CREATININE 1.30* 1.45*  CALCIUM  10.6* 9.2  GFRNONAA 43* 38*  ANIONGAP 15 10    No results for input(s): "PROT", "ALBUMIN ", "AST", "ALT", "ALKPHOS", "BILITOT" in the last 168 hours. Lipids No results for input(s): "CHOL", "TRIG", "HDL", "LABVLDL", "LDLCALC", "CHOLHDL" in the last 168 hours.  Hematology Recent Labs  Lab 01/19/24 1614 01/20/24 0558  WBC 9.2 10.5  RBC 4.68 4.16  HGB 14.6 12.8  HCT 44.7 40.0  MCV 95.5 96.2  MCH 31.2 30.8  MCHC 32.7 32.0  RDW 13.8 13.8  PLT 449* 406*   Thyroid   Recent Labs  Lab 01/20/24 0558  TSH 3.265    BNP Recent Labs  Lab 01/19/24 1655 01/20/24 0558  BNP  --  219.3*  PROBNP 3,003.0*  --  DDimer No results for input(s): "DDIMER" in the last 168 hours.   Radiology/Studies:  DG Chest Portable 1 View Result Date: 01/19/2024 CLINICAL DATA:  CP and dyspnea EXAM: PORTABLE CHEST - 1 VIEW COMPARISON:  Jan 16, 2021 FINDINGS: Similar blunting of the left costophrenic sulcus, likely a small amount of pleural thickening. No focal airspace consolidation, pleural effusion, or pneumothorax. No cardiomegaly. Tortuous aorta with aortic atherosclerosis. No acute fracture or destructive lesion. Multilevel thoracic osteophytosis. IMPRESSION: No acute cardiopulmonary abnormality. Electronically Signed   By: Rance Burrows M.D.   On: 01/19/2024 18:40     Assessment and Plan:   Atrial flutter RVR Hx of persistent A fib 2021 Elevated troponin  - presented with chest pain started Sunday night at rest, resolved last night after nitroglycerin  as well as conversion to sinus rhythm  - Hs trop 66 >78, BNP 219, TSH WNL  - EKG and telemetry showed likely atrial flutter RVR 110s 2:1, converted 2146 on 01/19/24, remains in sinus rhythm/bradycardia mid 50s  - Recent PET scan 01/07/24 showed very mild lateral ischemia  with relative decrease in LCX stress flow, low risk, no calcium  in Lcx, she did have LAD calcium  on CT but normal perfusion on PET, she was felt no further ischemic workup needed (see PET report for detail) - suspect this is demand ischemia from A flutter RVR, chest pain has resolved, may discharge on a 2 week monitor to assess A flutter burden, and refer to EP for consideration of ablation (message sent to coordinator) - continue PTA low dose coreg  3.125mg  BID, amiodarone  100mg , and Eliquis  5mg  BID; given post conversion bradycardia, will not increase dose at this time   Chronic HFrEF with recovered LVEF -  tachycardia mediated CM in 2021, LVEF recovered, LVEF 60% on PET 01/07/24 and 60-65% on Echo 11/04/23, no CHF symptoms, continue PTA coreg   - clinically euvolemic, labs with AKI, hold PTA Lasix  40mg  daily, encourage PO hydration, repeat BMP   HTN HLD Asthma - per primary team    Risk Assessment/Risk Scores:        CHA2DS2-VASc Score = 4   This indicates a 4.8% annual risk of stroke. The patient's score is based upon: CHF History: 1 HTN History: 1 Diabetes History: 0 Stroke History: 0 Vascular Disease History: 0 Age Score: 1 Gender Score: 1     For questions or updates, please contact [facility] Please consult [email or web address] for contact info under    Signed, Shadoe Cryan, NP  01/20/2024 1:42 PM

## 2024-01-20 NOTE — Progress Notes (Signed)
 Pt having 3/10 chest pain described as pressure. BP 142/101 MAP 115 HR 109. Gave Nitroglycerin  0.4 mg at 2134. Patient said pressure decreased a bit to a 2/10. Pt wanted to wait longer than the 5 minutes for the next nitroglycerin . BP was 124/101 MAP 111 HR 117 at 2142. Gave second Nitroglycerin  0.4 mg at 2151. Pt said pain decreased and was at most 1/10 and did not want to take another nitroglycerin . Dr. Chiquita Councilman came in and assessed patient while giving the nitroglycerin  and said she would order nitroglycerin  paste. Will continue to monitor.

## 2024-01-20 NOTE — TOC CM/SW Note (Signed)
 Transition of Care [facility]) - Inpatient Brief Assessment   Patient Details  Name: Erin Wong MRN: [id number] Date of Birth: 1951/08/04  [facility] CM/SW Contact:    Cosimo Diones, RN Phone Number: 01/20/2024, 2:48 PM   Clinical Narrative: Patient presented for chest pain. PTA patient was independent from home with spouse. Spouse in the room at the time of visit and he will provide transportation home once stable. No further home needs identified at this time.    Transition of Care Asessment: Insurance and Status: Insurance coverage has been reviewed Patient has primary care physician: Yes Home environment has been reviewed: reviewed Prior level of function:: independent Prior/Current Home Services: No current home services Social Drivers of Health Review: SDOH reviewed no interventions necessary Readmission risk has been reviewed: Yes Transition of care needs: no transition of care needs at this time

## 2024-01-20 NOTE — Plan of Care (Signed)
   Problem: Education: Goal: Knowledge of General Education information will improve Description Including pain rating scale, medication(s)/side effects and non-pharmacologic comfort measures Outcome: Progressing   Problem: Health Behavior/Discharge Planning: Goal: Ability to manage health-related needs will improve Outcome: Progressing

## 2024-01-20 NOTE — Progress Notes (Signed)
 Discharge instructions reviewed with patient by discharge nursing staff.

## 2024-01-20 NOTE — Care Management Obs Status (Signed)
 MEDICARE OBSERVATION STATUS NOTIFICATION   Patient Details  Name: Erin Wong MRN: [id number] Date of Birth: 1951-06-17   Medicare Observation Status Notification Given:  Yes Obs/[place] letter given and signed   Wynonia Hedges 01/20/2024, 11:23 AM

## 2024-01-20 NOTE — Plan of Care (Signed)
  Problem: Education: Goal: Knowledge of General Education information will improve Description: Including pain rating scale, medication(s)/side effects and non-pharmacologic comfort measures 01/20/2024 1736 by Corlis Dienes, RN Outcome: Adequate for Discharge 01/20/2024 1108 by Corlis Dienes, RN Outcome: Progressing   Problem: Health Behavior/Discharge Planning: Goal: Ability to manage health-related needs will improve 01/20/2024 1736 by Corlis Dienes, RN Outcome: Adequate for Discharge 01/20/2024 1108 by Corlis Dienes, RN Outcome: Progressing   Problem: Clinical Measurements: Goal: Ability to maintain clinical measurements within normal limits will improve Outcome: Adequate for Discharge Goal: Will remain free from infection Outcome: Adequate for Discharge Goal: Diagnostic test results will improve Outcome: Adequate for Discharge Goal: Respiratory complications will improve Outcome: Adequate for Discharge Goal: Cardiovascular complication will be avoided Outcome: Adequate for Discharge   Problem: Clinical Measurements: Goal: Ability to maintain clinical measurements within normal limits will improve Outcome: Adequate for Discharge Goal: Will remain free from infection Outcome: Adequate for Discharge Goal: Diagnostic test results will improve Outcome: Adequate for Discharge Goal: Respiratory complications will improve Outcome: Adequate for Discharge Goal: Cardiovascular complication will be avoided Outcome: Adequate for Discharge   Problem: Activity: Goal: Risk for activity intolerance will decrease Outcome: Adequate for Discharge   Problem: Nutrition: Goal: Adequate nutrition will be maintained Outcome: Adequate for Discharge   Problem: Coping: Goal: Level of anxiety will decrease Outcome: Adequate for Discharge   Problem: Elimination: Goal: Will not experience complications related to bowel motility Outcome: Adequate for Discharge Goal: Will not  experience complications related to urinary retention Outcome: Adequate for Discharge   Problem: Pain Managment: Goal: General experience of comfort will improve and/or be controlled Outcome: Adequate for Discharge   Problem: Safety: Goal: Ability to remain free from injury will improve Outcome: Adequate for Discharge   Problem: Skin Integrity: Goal: Risk for impaired skin integrity will decrease Outcome: Adequate for Discharge

## 2024-01-20 NOTE — Discharge Summary (Signed)
 Physician Discharge Summary   Patient: Erin Wong MRN: [id number] DOB: 03-Mar-1951  Admit date:     01/19/2024  Discharge date: 01/20/24  Discharge Physician: MDALA-GAUSI, Jilda Most   PCP: Austine Lefort, MD   Recommendations at discharge:   Follow-up with cardiology  Discharge Diagnoses: Principal Problem:   Chest pain  Resolved Problems:   * No resolved hospital problems. *  Hospital Course: 73 year old woman with PMH of A-fib on Eliquis , HFrEF with recovered EF, HTN, HLD, asthma, CKD stage III, pulmonary nodule who presented to the ED with chest pain. Cardiology was consulted.   The hospital course is in problem-based format below:    Chest pain Seen by cardiology. Troponin: 22 > 28 > 66 > 78 BNP: 219. EKG with no ischemic changes. Cardiology notes recent PET scan showed very mild lateral ischemia among other findings.  No further ischemic workup was felt to be indicated. Cardiology suspect chest pain is demand ischemia from atrial flutter with RVR.  Patient will need a 2-week cardiac monitor at discharge to assess A-flutter burden (cardiology has sent message to coordinator). Home Coreg , amiodarone , Eliquis  were continued with no dose changes. Per cardiology, given post conversion bradycardia will not increase dose of Coreg . A walk test was done prior to discharge and the patient had no recurrence of chest pain or arrhythmia.   Atrial flutter/A-fib with RVR Presented in a Flutter with RVR and converted to sinus rhythm last night.  Post-conversion, patient was bradycardic. For cardiac monitor at discharge.   Chronic HFrEF with recovered EF Seen by Cardiology.  Most recent TTE with EF 60-65% Home medications continued.   Hypertension Home Coreg  and Lasix  continued.   Hyperlipidemia Home Lipitor continued.   Asthma Home inhalers were continued.       Consultants: Cardiology Procedures performed: n/a  Disposition: Home Diet recommendation:   Discharge Diet Orders (From admission, onward)     Start     Ordered   01/20/24 0000  Diet - low sodium heart healthy        01/20/24 1621           Cardiac diet DISCHARGE MEDICATION: Allergies as of 01/20/2024   No Known Allergies      Medication List     TAKE these medications    albuterol  108 (90 Base) MCG/ACT inhaler Commonly known as: VENTOLIN  HFA Inhale 1 puff into the lungs every 6 (six) hours as needed for wheezing or shortness of breath.   amiodarone  200 MG tablet Commonly known as: PACERONE  TAKE 1/2 TABLET BY MOUTH EVERY DAY. What changed:  how much to take how to take this when to take this additional instructions   apixaban  5 MG Tabs tablet Commonly known as: Eliquis  Take 1 tablet (5 mg total) by mouth 2 (two) times daily.   atorvastatin  40 MG tablet Commonly known as: LIPITOR Take 1 tablet (40 mg total) by mouth daily.   Biotin 5000 MCG Tabs Take 5,000 mcg by mouth every evening.   CALCIUM  GLUCONATE PO Take 600 mg by mouth daily.   carvedilol  3.125 MG tablet Commonly known as: COREG  Take 1 tablet (3.125 mg total) by mouth 2 (two) times daily.   Cholecalciferol  25 MCG (1000 UT) tablet Take 1 tablet (1,000 Units total) by mouth daily.   fluticasone  50 MCG/ACT nasal spray Commonly known as: FLONASE  Place 2 sprays into both nostrils daily as needed for allergies.   fluticasone -salmeterol 100-50 MCG/ACT Aepb Commonly known as: Wixela Inhub Inhale 1 puff into  the lungs 2 (two) times daily.   furosemide  40 MG tablet Commonly known as: LASIX  Take 1 tablet (40 mg total) by mouth daily.   NEURIVA PO Take 1 tablet by mouth daily.   polyethylene glycol 17 g packet Commonly known as: MIRALAX  / GLYCOLAX  Take 17 g by mouth daily.   traMADol  50 MG tablet Commonly known as: ULTRAM  Take 1 tablet (50 mg total) by mouth 3 (three) times daily.   VITAMIN B-12 PO Take by mouth.        Discharge Exam: Filed Weights   01/19/24 2103   Weight: 67.9 kg   Physical Exam   General: Alert, oriented X3  Eyes: Pupils equal, reactive  Oral cavity: moist mucous membranes  Head: Atraumatic, normocephalic  Neck: supple  Chest: clear to auscultation. No crackles, no wheezes  CVS: S1,S2 RRR. No murmurs  Abd: No distention, soft, non-tender. No masses palpable  Extr: No edema   MSK: No joint deformities or swelling  Neurological: Grossly intact.    Condition at discharge: good  The results of significant diagnostics from this hospitalization (including imaging, microbiology, ancillary and laboratory) are listed below for reference.   Imaging Studies: DG Chest Portable 1 View Result Date: 01/19/2024 CLINICAL DATA:  CP and dyspnea EXAM: PORTABLE CHEST - 1 VIEW COMPARISON:  Jan 16, 2021 FINDINGS: Similar blunting of the left costophrenic sulcus, likely a small amount of pleural thickening. No focal airspace consolidation, pleural effusion, or pneumothorax. No cardiomegaly. Tortuous aorta with aortic atherosclerosis. No acute fracture or destructive lesion. Multilevel thoracic osteophytosis. IMPRESSION: No acute cardiopulmonary abnormality. Electronically Signed   By: Rance Burrows M.D.   On: 01/19/2024 18:40   NM PET CT CARDIAC PERFUSION MULTI W/ABSOLUTE BLOODFLOW Result Date: 01/07/2024   Findings are consistent with very mild lateral ischemia with relative decrease in LCX stress flow. The study is low risk due to small territory and normal global blood flow reserve.   LV perfusion is abnormal. There is evidence of ischemia. Defect 1: There is a small defect with mild reduction in uptake present in the mid lateral location(s) that is reversible. There is normal wall motion in the defect area. Consistent with ischemia.   Rest left ventricular function is normal. Rest EF: 60%. Stress left ventricular function is normal. Stress EF: 63%. End diastolic cavity size is normal. End systolic cavity size is normal.   Myocardial blood flow was  computed to be 0.64ml/g/min at rest and 1.46ml/g/min at stress. Global myocardial blood flow reserve was 2.28 and was normal.   Coronary calcium  was present on the attenuation correction CT images. Severe coronary calcifications were present. Coronary calcifications were present in the left anterior descending artery distribution(s). Aortic atherosclerosis noted.   Electronically Signed  By: Gloriann Larger  M.D. CLINICAL DATA:  This over-read does not include interpretation of cardiac or coronary anatomy or pathology. The Cardiac PET CT interpretation by the cardiologist is attached. COMPARISON:  None Available. FINDINGS: Cardiovascular: Scattered aortic atherosclerosis. Mild cardiomegaly. Left coronary artery calcifications and stents. No pericardial effusion. Limited Mediastinum/Nodes: No enlarged mediastinal, hilar, or axillary lymph nodes. Trachea and esophagus demonstrate no significant findings. Limited Lungs/Pleura: Lungs are clear. No pleural effusion or pneumothorax. Upper Abdomen: No acute abnormality. Musculoskeletal: No chest wall abnormality. No acute osseous findings. IMPRESSION: 1. No acute CT findings of the included chest. 2. Coronary artery disease. Aortic Atherosclerosis (ICD10-I70.0). Electronically Signed   By: Fredricka Jenny M.D.   On: 01/07/2024 10:28   Microbiology: Results for orders placed  or performed during the hospital encounter of 02/15/21  Surgical pcr screen     Status: Abnormal   Collection Time: 02/15/21  9:40 AM   Specimen: Nasal Mucosa; Nasal Swab  Result Value Ref Range Status   MRSA, PCR NEGATIVE NEGATIVE Final   Staphylococcus aureus POSITIVE (A) NEGATIVE Final    Comment: (NOTE) The Xpert SA Assay (FDA approved for NASAL specimens in patients 12 years of age and older), is one component of a comprehensive surveillance program. It is not intended to diagnose infection nor to guide or monitor treatment. Performed at [facility], [address],  [place], [place] [address]   SARS CORONAVIRUS 2 (TAT 6-24 HRS) Nasopharyngeal Nasopharyngeal Swab     Status: None   Collection Time: 02/15/21 10:12 AM   Specimen: Nasopharyngeal Swab  Result Value Ref Range Status   SARS Coronavirus 2 NEGATIVE NEGATIVE Final    Comment: (NOTE) SARS-CoV-2 target nucleic acids are NOT DETECTED.  The SARS-CoV-2 RNA is generally detectable in upper and lower respiratory specimens during the acute phase of infection. Negative results do not preclude SARS-CoV-2 infection, do not rule out co-infections with other pathogens, and should not be used as the sole basis for treatment or other patient management decisions. Negative results must be combined with clinical observations, patient history, and epidemiological information. The expected result is Negative.  Fact Sheet for Patients: HairSlick.no  Fact Sheet for Healthcare Providers: quierodirigir.com  This test is not yet approved or cleared by the [place]  FDA and  has been authorized for detection and/or diagnosis of SARS-CoV-2 by FDA under an Emergency Use Authorization (EUA). This EUA will remain  in effect (meaning this test can be used) for the duration of the COVID-19 declaration under Se ction 564(b)(1) of the Act, 21 U.S.C. section 360bbb-3(b)(1), unless the authorization is terminated or revoked sooner.  Performed at [facility], [address], [place], [place] [address]     Labs: CBC: Recent Labs  Lab 01/19/24 1614 01/20/24 0558  WBC 9.2 10.5  HGB 14.6 12.8  HCT 44.7 40.0  MCV 95.5 96.2  PLT 449* 406*   Basic Metabolic Panel: Recent Labs  Lab 01/19/24 1614 01/20/24 0558  NA 140 139  K 4.0 3.5  CL 98 99  CO2 26 30  GLUCOSE 126* 98  BUN 15 17  CREATININE 1.30* 1.45*  CALCIUM  10.6* 9.2   Liver Function Tests: No results for input(s): "AST", "ALT", "ALKPHOS", "BILITOT", "PROT", "ALBUMIN " in the last 168  hours. CBG: No results for input(s): "GLUCAP" in the last 168 hours.  Discharge time spent: greater than 30 minutes.  Signed: MDALA-GAUSI, Usha Slager AGATHA, MD [facility] 01/20/2024

## 2024-01-20 NOTE — Progress Notes (Signed)
 Pt called for nurse and said the pain and pressure were back. Pt said it was 5/10 pain/pressure with tingling in both arms and feet. Notified Dr. Chiquita Councilman who said give Pt morphine  2 mg IV. Put Pt on O2 3L's. Gave morphine  at 0017. Pt said within minutes pain was going away and at 0025 pain was a 1/10 and she felt much better. Pt said she just wanted to rest. Checked on Pt numerous times and she said she felt fine and was resting hoping to fall asleep. Will continue to monitor.

## 2024-01-20 NOTE — Progress Notes (Signed)
 Reviewed AVS with patient and family. Removed IV, site clean and dry. Informed patient that she had no medications to pick up from a pharmacy. Answered all questions. Patient states that all belongings brought to the hospital at time of admission are all accounted for and packed for her to take home. Informed patient of all up-coming appointments. Patient verbalized understanding of instruction in the AVS.

## 2024-01-21 ENCOUNTER — Telehealth: Payer: Self-pay

## 2024-01-21 NOTE — Transitions of Care (Post Inpatient/ED Visit) (Signed)
 01/21/2024  Name: Erin Wong MRN: [id number] DOB: 1950-09-19  Today's TOC FU Call Status: Today's TOC FU Call Status:: Successful TOC FU Call Completed TOC FU Call Complete Date: 01/21/24 Patient's Name and Date of Birth confirmed.  Transition Care Management Follow-up Telephone Call Date of Discharge: 01/20/24 Discharge Facility: [facility]) Type of Discharge: Inpatient Admission Primary Inpatient Discharge Diagnosis:: aflutter How have you been since you were released from the hospital?: Better Any questions or concerns?: No  Items Reviewed: Did you receive and understand the discharge instructions provided?: Yes Medications obtained,verified, and reconciled?: Yes (Medications Reviewed) Any new allergies since your discharge?: No Dietary orders reviewed?: Yes Do you have support at home?: Yes People in Home [RPT]: spouse  Medications Reviewed Today: Medications Reviewed Today     Reviewed by Darrall Ellison, LPN (Licensed Practical Nurse) on 01/21/24 at 1203  Med List Status: <None>   Medication Order Taking? Sig Documenting Provider Last Dose Status Informant  albuterol  (VENTOLIN  HFA) 108 (90 Base) MCG/ACT inhaler [id number] No Inhale 1 puff into the lungs every 6 (six) hours as needed for wheezing or shortness of breath. Wilhemena Harbour, NP 01/19/2024 Morning Active Self  amiodarone  (PACERONE ) 200 MG tablet [id number] No TAKE 1/2 TABLET BY MOUTH EVERY DAY.  Patient taking differently: Take 100 mg by mouth 2 (two) times daily.   Nishan, Peter C, MD 01/19/2024 Morning Active Self  apixaban  (ELIQUIS ) 5 MG TABS tablet [id number] No Take 1 tablet (5 mg total) by mouth 2 (two) times daily. Loyde Rule, MD 01/19/2024  9:30 AM Active Self  atorvastatin  (LIPITOR) 40 MG tablet [id number] No Take 1 tablet (40 mg total) by mouth daily. Loyde Rule, MD 01/19/2024 Morning Active Self  Biotin 5000 MCG TABS [id number] No Take 5,000 mcg by mouth every evening. [provider] 01/18/2024 Evening Active Self  CALCIUM  GLUCONATE PO [id number] No Take 600 mg by mouth daily.  Patient not taking: Reported on 12/16/2023   [provider] Not Taking Active Self  carvedilol  (COREG ) 3.125 MG tablet [id number] No Take 1 tablet (3.125 mg total) by mouth 2 (two) times daily. Nishan, Peter C, MD 01/19/2024 Morning Active Self  Cholecalciferol  25 MCG (1000 UT) tablet [id number] No Take 1 tablet (1,000 Units total) by mouth daily. Wilhemena Harbour, NP 01/19/2024 Morning Active Self  Cyanocobalamin (VITAMIN B-12 PO) [id number] No Take by mouth. [provider] 01/19/2024 Morning Active Self  fluticasone  (FLONASE ) 50 MCG/ACT nasal spray [id number] No Place 2 sprays into both nostrils daily as needed for allergies. Thena Fireman A, NP 01/18/2024 Morning Active Self  fluticasone -salmeterol (WIXELA INHUB) 100-50 MCG/ACT AEPB [id number] No Inhale 1 puff into the lungs 2 (two) times daily. Austine Lefort, MD 01/19/2024 Morning Active Self  furosemide  (LASIX ) 40 MG tablet [id number] No Take 1 tablet (40 mg total) by mouth daily. Loyde Rule, MD 01/19/2024 Morning Active Self  Misc Natural Products (NEURIVA PO) [id number] No Take 1 tablet by mouth daily. [provider] 01/19/2024 Morning Active Self  polyethylene glycol (MIRALAX  / GLYCOLAX ) 17 g packet [id number] No Take 17 g by mouth daily. [provider] 01/18/2024 Morning Active Self  traMADol  (ULTRAM ) 50 MG tablet [id number]  Take 1 tablet (50 mg total) by mouth 3 (three) times daily. Mdala-Gausi, Jilda Most, MD  Active             Home Care and Equipment/Supplies: Were Home Health Services Ordered?: NA Any new equipment or medical supplies ordered?: NA  Functional Questionnaire: Do you need assistance with bathing/showering or dressing?: No Do you need assistance with meal preparation?: No Do you need assistance with eating?: No Do you have difficulty maintaining continence: No Do  you need assistance with getting out of bed/getting out of a chair/moving?: No Do you have difficulty managing or taking your medications?: No  Follow up appointments reviewed: PCP Follow-up appointment confirmed?: Yes Date of PCP follow-up appointment?: 01/22/24 Follow-up Provider: [facility] Follow-up appointment confirmed?: No Reason Specialist Follow-Up Not Confirmed: Patient has Specialist Provider Number and will Call for Appointment Do you need transportation to your follow-up appointment?: No Do you understand care options if your condition(s) worsen?: Yes-patient verbalized understanding    SIGNATURE Darrall Ellison, LPN [facility] Nurse Health Advisor Direct Dial [phone]

## 2024-01-22 ENCOUNTER — Ambulatory Visit: Payer: 59

## 2024-02-05 ENCOUNTER — Ambulatory Visit
Admission: RE | Admit: 2024-02-05 | Discharge: 2024-02-05 | Disposition: A | Discharge status: Home / Self Care | Source: Ambulatory Visit | Attending: Emergency Medicine | Admitting: Emergency Medicine

## 2024-02-05 DIAGNOSIS — I2510 Atherosclerotic heart disease of native coronary artery without angina pectoris: Secondary | ICD-10-CM | POA: Diagnosis not present

## 2024-02-05 DIAGNOSIS — R911 Solitary pulmonary nodule: Secondary | ICD-10-CM

## 2024-02-05 DIAGNOSIS — J439 Emphysema, unspecified: Secondary | ICD-10-CM | POA: Diagnosis not present

## 2024-02-10 NOTE — Progress Notes (Deleted)
  Electrophysiology Office Note:    Date:  02/10/2024   ID:  Erin Wong, DOB 1951-04-26, MRN [id number]  PCP:  Austine Lefort, MD   [facility] Providers Cardiologist:  Janelle Mediate, MD { Click to update primary MD,subspecialty MD or APP then REFRESH:1}    Referring MD: Austine Lefort, MD   History of Present Illness:    Erin Wong is a 73 y.o. female with a medical history significant for paroxysmal atrial fibrillation, CHF recovered EF, coronary disease, hypertension, hyperlipidemia, asthma referred for ***.     She has a history of persistent atrial fibrillation and failed DC cardioversion in May 2021 with return of sinus rhythm on amiodarone  subsequently.  Her EF at that time was 30 to 35%.  She had episodes of sinus bradycardia on beta-blocker and amiodarone  requiring Doose dosing.Erin Wong  She was admitted in May 2025 with chest pressure and discomfort and found to be in atrial flutter.  Prior coronary imaging showed possible ischemic disease though the area was small.  The patient did not undergo coronary angiogram as her symptoms occurred only with flutter but not with exertion in normal circumstances.  Discussed the use of AI scribe software for clinical note transcription with the patient, who gave verbal consent to proceed.  History of Present Illness          Today, ***  EKGs/Labs/Other Studies Reviewed Today:     Echocardiogram:  *** ***   Monitors:  *** day monitor ***  -- my interpretation ***  Stress testing:  *** ***  Advanced imaging:  NM PET May 2025 Very mild lateral ischemia with relative decrease in left circumflex stress flow.  Low risk study due to small territory.  Normal ejection fraction.  Cardiac catherization  *** ***  EKG:         Physical Exam:    VS:  There were no vitals taken for this visit.    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

## 2024-02-11 ENCOUNTER — Ambulatory Visit: Attending: Cardiovascular Disease | Admitting: Cardiovascular Disease

## 2024-02-11 ENCOUNTER — Ambulatory Visit (INDEPENDENT_AMBULATORY_CARE_PROVIDER_SITE_OTHER): Admitting: Emergency Medicine

## 2024-02-11 ENCOUNTER — Encounter: Payer: Self-pay | Admitting: Emergency Medicine

## 2024-02-11 VITALS — BP 128/70 | HR 58 | Temp 97.7°F | Ht 63.0 in | Wt 156.4 lb

## 2024-02-11 DIAGNOSIS — R911 Solitary pulmonary nodule: Secondary | ICD-10-CM | POA: Diagnosis not present

## 2024-02-11 DIAGNOSIS — J4520 Mild intermittent asthma, uncomplicated: Secondary | ICD-10-CM

## 2024-02-11 DIAGNOSIS — I4891 Unspecified atrial fibrillation: Secondary | ICD-10-CM | POA: Diagnosis not present

## 2024-02-11 NOTE — Progress Notes (Signed)
 Subjective:    Patient ID: Erin Wong, female    DOB: 1951/03/09, 73 y.o.   MRN: [id number]  HPI   ROV 02/11/2024 --73 year old woman with A-fib, CKD stage III, former tobacco use, asthma.  She has been managed on Wixela and albuterol  as needed.  She is managed by Dr. Nishan for atrial fibrillation and is on amiodarone .  Last pulmonary function testing 01/22/2021.  I saw her in April for an abnormal CT scan of the chest.  She had a cardiac calcium  scoring CT February 2025 that showed a 1 cm right lower lobe nodule that was not present on scan from 2021. Overall she is doing well, no dyspnea. Rare albuterol  use. No real cough or wheeze.    CT chest 02/05/2024 reviewed by me, shows moderate centrilobular paraseptal emphysema, unchanged subsolid anterior superior segment right lower lobe pulmonary nodule 1.0 x 0.7 cm, some lingular tree-in-bud nodularity   Review of Systems As per HPI  Past Medical History:  Diagnosis Date   Allergy    Arthritis    Asthma    Atrial fibrillation (HCC)    Benign essential HTN    CHF (congestive heart failure) (HCC)    CKD (chronic kidney disease) stage 3, GFR 30-59 ml/min (HCC)    Diverticulosis    Dysrhythmia     Afib -  had Ablation   GERD (gastroesophageal reflux disease)    HLD (hyperlipidemia)    HTN (hypertension)    Pneumonia      Family History  Problem Relation Age of Onset   Arthritis Mother    Miscarriages / [place] Mother    Lung cancer Mother        Had quit smoking for 25 years   Esophageal cancer Mother    Alcohol abuse Father    Breast cancer Sister    Alcohol abuse Brother    Kidney cancer Brother    COPD Brother    Asthma Maternal Aunt    Diabetes Maternal Aunt    Colon cancer Neg Hx    Inflammatory bowel disease Neg Hx    Liver disease Neg Hx    Pancreatic cancer Neg Hx    Rectal cancer Neg Hx    Stomach cancer Neg Hx    Colon polyps Neg Hx      Social History   Socioeconomic History   Marital status:  Married    Spouse name: Not on file   Number of children: 3   Years of education: Not on file   Highest education level: Not on file  Occupational History   Occupation: retired  Tobacco Use   Smoking status: Former    Current packs/day: 0.00    Types: Cigarettes    Quit date: 01/10/1991    Years since quitting: 33.1   Smokeless tobacco: Never  Vaping Use   Vaping status: Never Used  Substance and Sexual Activity   Alcohol use: Not Currently    Comment: quit in May/2021   Drug use: No   Sexual activity: Never  Other Topics Concern   Not on file  Social History Narrative   Not on file   Social Drivers of Health   Financial Resource Strain: Low Risk  (01/16/2023)   Overall Financial Resource Strain (CARDIA)    Difficulty of Paying Living Expenses: Not hard at all  Food Insecurity: No Food Insecurity (01/19/2024)   Hunger Vital Sign    Worried About Running Out of Food in the Last Year: Never true  Ran Out of Food in the Last Year: Never true  Transportation Needs: No Transportation Needs (01/19/2024)   PRAPARE - Administrator, Civil Service (Medical): No    Lack of Transportation (Non-Medical): No  Physical Activity: Insufficiently Active (01/16/2023)   Exercise Vital Sign    Days of Exercise per Week: 3 days    Minutes of Exercise per Session: 30 min  Stress: No Stress Concern Present (01/16/2023)   [employer] of Occupational Health - Occupational Stress Questionnaire    Feeling of Stress : Not at all  Social Connections: Moderately Integrated (01/19/2024)   Social Connection and Isolation Panel [NHANES]    Frequency of Communication with Friends and Family: More than three times a week    Frequency of Social Gatherings with Friends and Family: Three times a week    Attends Religious Services: 1 to 4 times per year    Active Member of Clubs or Organizations: No    Attends Banker Meetings: Never    Marital Status: Married  Careers information officer Violence: Not At Risk (01/19/2024)   Humiliation, Afraid, Rape, and Kick questionnaire    Fear of Current or Ex-Partner: No    Emotionally Abused: No    Physically Abused: No    Sexually Abused: No    - Former smoker, smoked a pack a day for 35 years  No Known Allergies   Outpatient Medications Prior to Visit  Medication Sig Dispense Refill   albuterol  (VENTOLIN  HFA) 108 (90 Base) MCG/ACT inhaler Inhale 1 puff into the lungs every 6 (six) hours as needed for wheezing or shortness of breath. 18 g 3   amiodarone  (PACERONE ) 200 MG tablet TAKE 1/2 TABLET BY MOUTH EVERY DAY. (Patient taking differently: Take 100 mg by mouth 2 (two) times daily.) 45 tablet 3   apixaban  (ELIQUIS ) 5 MG TABS tablet Take 1 tablet (5 mg total) by mouth 2 (two) times daily. 180 tablet 3   atorvastatin  (LIPITOR) 40 MG tablet Take 1 tablet (40 mg total) by mouth daily. 90 tablet 3   Biotin 5000 MCG TABS Take 5,000 mcg by mouth every evening.     carvedilol  (COREG ) 3.125 MG tablet Take 1 tablet (3.125 mg total) by mouth 2 (two) times daily. 180 tablet 3   Cholecalciferol  25 MCG (1000 UT) tablet Take 1 tablet (1,000 Units total) by mouth daily. 90 tablet 1   Cyanocobalamin (VITAMIN B-12 PO) Take by mouth.     fluticasone  (FLONASE ) 50 MCG/ACT nasal spray Place 2 sprays into both nostrils daily as needed for allergies. 16 g 3   fluticasone -salmeterol (WIXELA INHUB) 100-50 MCG/ACT AEPB Inhale 1 puff into the lungs 2 (two) times daily. 180 each 2   furosemide  (LASIX ) 40 MG tablet Take 1 tablet (40 mg total) by mouth daily. 90 tablet 3   Misc Natural Products (NEURIVA PO) Take 1 tablet by mouth daily.     polyethylene glycol (MIRALAX  / GLYCOLAX ) 17 g packet Take 17 g by mouth daily.     traMADol  (ULTRAM ) 50 MG tablet Take 1 tablet (50 mg total) by mouth 3 (three) times daily.     CALCIUM  GLUCONATE PO Take 600 mg by mouth daily. (Patient not taking: Reported on 12/16/2023)     No facility-administered medications prior  to visit.         Objective:   Physical Exam Vitals:   02/11/24 1553  BP: 128/70  Pulse: (!) 58  Temp: 97.7 F (36.5 C)  TempSrc:  Temporal  SpO2: 96%  Weight: 156 lb 6.4 oz (70.9 kg)  Height: 5' 3 (1.6 m)   Gen: Pleasant, well-nourished, in no distress,  normal affect  ENT: No lesions,  mouth clear,  oropharynx clear, no postnasal drip  Neck: No JVD, no stridor  Lungs: No use of accessory muscles, no crackles or wheezing on normal respiration, no wheeze on forced expiration  Cardiovascular: RRR, heart sounds normal, no murmur or gallops, no peripheral edema  Musculoskeletal: No deformities, no cyanosis or clubbing  Neuro: alert, awake, non focal  Skin: Warm, no lesions or rash       Assessment & Plan:   Pulmonary nodule 1 cm or greater in diameter Her right superior segmental nodule has appearance suspicious for possible adenocarcinoma.  It has not changed in size.  We will plan to follow her closely, next scan in 6 months.  If there is an interval change or change in our level of suspicion then we will either plan for bronchoscopy with biopsy or refer her directly to cardiothoracic surgery for possible surgical resection.  We reviewed your CT scan of the chest today. We will plan to repeat your CT chest in December 2025 to follow for stability. Follow with Dr. Baldwin Levee in December after your CT chest so we can review those results together  Asthma Please continue your Wixela twice a day.  Rinse and gargle after using. Keep your albuterol  available to use 2 puffs when needed for shortness of breath, chest tightness, wheezing.  Atrial fibrillation with rapid ventricular response (HCC) Now rate controlled.  She is on amiodarone .  Denies any dyspnea.  Her most recent pulmonary function testing was 2022.  Dr. Stann Earnest ordered repeat but it has not yet been done.  Will make sure that we get this done in the near future.    Racheal Buddle, MD, PhD 02/11/2024, 4:32  PM [facility] [phone] or if no answer before 7:00PM call [phone] For any issues after 7:00PM please call eLink [phone]

## 2024-02-11 NOTE — Assessment & Plan Note (Signed)
 Please continue your Wixela twice a day.  Rinse and gargle after using. Keep your albuterol  available to use 2 puffs when needed for shortness of breath, chest tightness, wheezing.

## 2024-02-11 NOTE — Patient Instructions (Signed)
 We reviewed your CT scan of the chest today. We will plan to repeat your CT chest in December 2025 to follow for stability. Please continue your Wixela twice a day.  Rinse and gargle after using. Keep your albuterol  available to use 2 puffs when needed for shortness of breath, chest tightness, wheezing. Follow with Dr. Baldwin Levee in December after your CT chest so we can review those results together

## 2024-02-11 NOTE — Assessment & Plan Note (Signed)
 Her right superior segmental nodule has appearance suspicious for possible adenocarcinoma.  It has not changed in size.  We will plan to follow her closely, next scan in 6 months.  If there is an interval change or change in our level of suspicion then we will either plan for bronchoscopy with biopsy or refer her directly to cardiothoracic surgery for possible surgical resection.  We reviewed your CT scan of the chest today. We will plan to repeat your CT chest in December 2025 to follow for stability. Follow with Dr. Baldwin Levee in December after your CT chest so we can review those results together

## 2024-02-11 NOTE — Assessment & Plan Note (Signed)
 Now rate controlled.  She is on amiodarone .  Denies any dyspnea.  Her most recent pulmonary function testing was 2022.  Dr. Stann Earnest ordered repeat but it has not yet been done.  Will make sure that we get this done in the near future.

## 2024-02-16 NOTE — Progress Notes (Unsigned)
 Electrophysiology Office Note:    Date:  02/17/2024   ID:  Erin Wong, DOB 03-22-1951, MRN [id number]  PCP:  Austine Lefort, MD   [facility] Providers Cardiologist:  Janelle Mediate, MD     Referring MD: Austine Lefort, MD   History of Present Illness:    Erin Wong is a 73 y.o. female with a medical history significant for paroxysmal atrial fibrillation cardiomyopathy with recovered ejection fraction, hypertension, referred for arrhythmia management.       Discussed the use of AI scribe software for clinical note transcription with the patient, who gave verbal consent to proceed.  History of Present Illness Erin Wong is a 73 year old female with atrial fibrillation who presents for follow-up after a recent admission for atrial flutter.  She has a history of persistent atrial fibrillation, initially diagnosed in May 2021, which led to the development of cardiomyopathy with an ejection fraction of 30-35%. Initial treatment with DC cardioversion was unsuccessful, but she later converted to sinus rhythm on amiodarone , resulting in improved left ventricular function with guideline-directed medical therapy.  In May 2025, she was admitted with atrial flutter, presenting with palpitations, chest pain, pressure, and shortness of breath. Rapid ventricular rates were noted, and she was converted to sinus rhythm with ibuprofen. A monitor in July 2021 showed less than 1% atrial fibrillation burden. Her doses of beta blocker and amiodarone  were decreased due to bradycardia.  She experiences palpitations, chest pain, pressure, shortness of breath, and fatigue during arrhythmias. She is currently on amiodarone  but has had a recurrence of arrhythmia while on this medication.  A small nodule on her lung, described as a 'haze' or 'shadow', is being monitored by Dr. Edwyna Grams. The nodule has not changed in size or moved, with a follow-up CT scan planned for December 2025.          Today, she reports that she feels reasonably well.  She has no palpitations.  EKGs/Labs/Other Studies Reviewed Today:     Echocardiogram:  TTE March 2025 LVEF 60 to 65%.  No wall motion abnormalities.  Heart is grossly normal in structure and function.    Advanced imaging:  PET Mild focal ischemia of the lateral wall    EKG:   EKG Interpretation Date/Time:  Tuesday February 17 2024 11:48:19 EDT Ventricular Rate:  57 PR Interval:  208 QRS Duration:  104 QT Interval:  444 QTC Calculation: 432 R Axis:   64  Text Interpretation: Sinus bradycardia When compared with ECG of 19-Jan-2024 21:10,  sinus rhythm has replaced atrial flutter Confirmed by Marlane Silver [phone]) on 02/17/2024 12:12:36 PM     Physical Exam:    VS:  BP (!) 144/74 (BP Location: Left Arm, Patient Position: Sitting, Cuff Size: Normal)   Pulse (!) 57   Ht 5' 3 (1.6 m)   Wt 156 lb (70.8 kg)   SpO2 96%   BMI 27.63 kg/m     Wt Readings from Last 3 Encounters:  02/17/24 156 lb (70.8 kg)  02/11/24 156 lb 6.4 oz (70.9 kg)  01/19/24 149 lb 11.2 oz (67.9 kg)     GEN: Well nourished, well developed in no acute distress CARDIAC: RRR, no murmurs, rubs, gallops RESPIRATORY:  Normal work of breathing MUSCULOSKELETAL: no edema    ASSESSMENT & PLAN:     History of atrial fibrillation, atrial flutter on admission May 2025 Atrial fibrillation diagnosed in 2021 Failed cardioversion in 2021, converted to sinus on amiodarone  She  has had recurrence of atrial fibrillation on amiodarone  We discussed management options, and using a shared decision making approach decided to proceed with rhythm control by ablation We will plan for flutter line with ablation \\Will  plan to discontinue amiodarone  after ablation  We discussed the indication, rationale, logistics, anticipated benefits, and potential risks of the ablation procedure including but not limited to -- bleed at the groin access site, chest pain,  damage to nearby organs such as the diaphragm, lungs, or esophagus, need for a drainage tube, or prolonged hospitalization. I explained that the risk for stroke, heart attack, need for open chest surgery, or even death is very low but not zero. she  expressed understanding and wishes to proceed.   Chronic CHF with reduced EF, EF recovered Tachycardia mediated cardiomyopathy in 2021 LVEF 60% on PET May 2025 No CHF symptoms.  Secondary hypercoagulable state Continue apixaban  5 mg twice daily    Signed, Efraim Grange, MD  02/17/2024 12:12 PM    [facility]

## 2024-02-17 ENCOUNTER — Ambulatory Visit: Attending: Cardiovascular Disease | Admitting: Cardiovascular Disease

## 2024-02-17 ENCOUNTER — Encounter: Payer: Self-pay | Admitting: Cardiovascular Disease

## 2024-02-17 VITALS — BP 144/74 | HR 57 | Ht 63.0 in | Wt 156.0 lb

## 2024-02-17 DIAGNOSIS — I5022 Chronic systolic (congestive) heart failure: Secondary | ICD-10-CM | POA: Diagnosis not present

## 2024-02-17 DIAGNOSIS — I5021 Acute systolic (congestive) heart failure: Secondary | ICD-10-CM

## 2024-02-17 DIAGNOSIS — I4891 Unspecified atrial fibrillation: Secondary | ICD-10-CM

## 2024-02-17 NOTE — Patient Instructions (Addendum)
 Medication Instructions:  Your physician recommends that you continue on your current medications as directed. Please refer to the Current Medication list given to you today. *If you need a refill on your cardiac medications before your next appointment, please call your pharmacy*  Lab Work: CBC and BMET - please have pre-procedure lab work completed on Monday, July 14 (a day or two after this is fine as well) . This can be done at ANY [facility] near you - no appointment required and this does not have to be fasting. If you have labs (blood work) drawn today and your tests are completely normal, you will receive your results only by: MyChart Message (if you have MyChart) OR A paper copy in the mail If you have any lab test that is abnormal or we need to change your treatment, we will call you to review the results.  Testing/Procedures: Cardiac CT - someone will contact you to schedule this  Your physician has requested that you have cardiac CT. Cardiac computed tomography (CT) is a painless test that uses an x-ray machine to take clear, detailed pictures of your heart. For further information please visit [email or web address] Please follow instruction sheet as given.   Atrial Fibrillation Ablation - scheduled on Tuesday, August 12. We will be in contact closer to your ablation date with further instructions Your physician has recommended that you have an ablation. Catheter ablation is a medical procedure used to treat some cardiac arrhythmias (irregular heartbeats). During catheter ablation, a long, thin, flexible tube is put into a blood vessel in your groin (upper thigh), or neck. This tube is called an ablation catheter. It is then guided to your heart through the blood vessel. Radio frequency waves destroy small areas of heart tissue where abnormal heartbeats may cause an arrhythmia to start. Please see the instruction sheet given to you today.  Follow-Up: At [facility], you and your  health needs are our priority.  As part of our continuing mission to provide you with exceptional heart care, our providers are all part of one team.  This team includes your primary Cardiologist (physician) and Advanced Practice Providers or APPs (Physician Assistants and Nurse Practitioners) who all work together to provide you with the care you need, when you need it.  Your next appointment:   We will schedule follow up after your ablation  Provider:   Marlane Silver, MD   Cardiac Ablation Cardiac ablation is a procedure to destroy, or ablate, a small amount of heart tissue that is causing problems. The heart has many electrical connections. Sometimes, these connections are abnormal and can cause the heart to beat very fast or irregularly. Ablating the abnormal areas can improve the heart's rhythm or return it to normal. Ablation may be done for people who: Have irregular or rapid heartbeats (arrhythmias). Have Wolff-Parkinson-White syndrome. Have taken medicines for an arrhythmia that did not work or caused side effects. Have a high-risk heartbeat that may be life-threatening. Tell a health care provider about: Any allergies you have. All medicines you are taking, including vitamins, herbs, eye drops, creams, and over-the-counter medicines. Any problems you or family members have had with anesthesia. Any bleeding problems you have. Any surgeries you have had. Any medical conditions you have. Whether you are pregnant or may be pregnant. What are the risks? Your health care provider will talk with you about risks. These may include: Infection. Bruising and bleeding. Stroke or blood clots. Damage to nearby structures or organs. Allergic reaction to medicines  or dyes. Needing a pacemaker if the heart gets damaged. A pacemaker is a device that helps the heart beat normally. Failure of the procedure. A repeat procedure may be needed. What happens before the procedure? Medicines Ask  your health care provider about: Changing or stopping your regular medicines. These include any heart rhythm medicines, diabetes medicines, or blood thinners you take. Taking medicines such as aspirin  and ibuprofen. These medicines can thin your blood. Do not take them unless your health care provider tells you to. Taking over-the-counter medicines, vitamins, herbs, and supplements. General instructions Follow instructions from your health care provider about what you may eat and drink. If you will be going home right after the procedure, plan to have a responsible adult: Take you home from the hospital or clinic. You will not be allowed to drive. Care for you for the time you are told. Ask your health care provider what steps will be taken to prevent infection. What happens during the procedure?  An IV will be inserted into one of your veins. You may be given: A sedative. This helps you relax. Anesthesia. This will: Numb certain areas of your body. An incision will be made in your neck or your groin. A needle will be inserted through the incision and into a large vein in your neck or groin. The small, thin tube (catheter) will be inserted through the needle and moved to your heart. A type of X-ray (fluoroscopy) will be used to help guide the catheter and provide images of the heart on a monitor. Dye may be injected through the catheter to help your surgeon see the area of the heart that needs treatment. Electrical currents will be sent from the catheter to destroy heart tissue in certain areas. There are three types of energy that may be used to do this: Heat (radiofrequency energy). Laser energy. Extreme cold (cryoablation). When the tissue has been destroyed, the catheter will be removed. Pressure will be held on the insertion area to prevent bleeding. A bandage (dressing) will be placed over the insertion area. The procedure may vary among health care providers and hospitals. What  happens after the procedure? Your blood pressure, heart rate and rhythm, breathing rate, and blood oxygen  level will be monitored until you leave the hospital or clinic. Your insertion area will be checked for bleeding. You will need to lie still for a few hours. If your groin was used, you will need to keep your leg straight for a few hours after the catheter is removed. This information is not intended to replace advice given to you by your health care provider. Make sure you discuss any questions you have with your health care provider. Document Revised: 02/05/2022 Document Reviewed: 02/05/2022 Elsevier Patient Education  2024 [employer].

## 2024-02-17 NOTE — Addendum Note (Signed)
 Addended by: Lonnie Roberts R on: 02/17/2024 12:30 PM   Modules accepted: Orders

## 2024-02-24 ENCOUNTER — Encounter: Payer: Self-pay | Admitting: Podiatry

## 2024-02-24 ENCOUNTER — Ambulatory Visit (INDEPENDENT_AMBULATORY_CARE_PROVIDER_SITE_OTHER): Payer: Self-pay | Admitting: Podiatry

## 2024-02-24 DIAGNOSIS — M79671 Pain in right foot: Secondary | ICD-10-CM | POA: Diagnosis not present

## 2024-02-24 DIAGNOSIS — M79672 Pain in left foot: Secondary | ICD-10-CM

## 2024-02-24 DIAGNOSIS — B351 Tinea unguium: Secondary | ICD-10-CM | POA: Diagnosis not present

## 2024-02-24 NOTE — Progress Notes (Signed)
 Patient presents for evaluation and treatment of tenderness and some redness around nails feet.  Tenderness around toes with walking and wearing shoes.  Physical exam:  General appearance: Alert, pleasant, and in no acute distress.  Vascular: Pedal pulses: DP 1/4 B/L, PT 0/4 B/L. mild edema lower legs bilaterally  Neurological:    Dermatologic:  Nails thickened, disfigured, discolored 2-5 BL with subungual debris.  Redness and hypertrophic nail folds along nail folds bilaterally but no signs of drainage or infection.  Skin thin and atrophic.  Status post permanent removal hallux nails bilaterally   Musculoskeletal:     Diagnosis: 1. Painful onychomycotic nails 2 through 5 bilaterally. 2. Pain toes 1 through 5 bilaterally.  Plan: Debrided onychomycotic nails 1 through 5 bilaterally.  Return 3 months

## 2024-03-16 DIAGNOSIS — I5021 Acute systolic (congestive) heart failure: Secondary | ICD-10-CM | POA: Diagnosis not present

## 2024-03-16 DIAGNOSIS — I4891 Unspecified atrial fibrillation: Secondary | ICD-10-CM | POA: Diagnosis not present

## 2024-03-16 DIAGNOSIS — I5022 Chronic systolic (congestive) heart failure: Secondary | ICD-10-CM | POA: Diagnosis not present

## 2024-03-16 LAB — CBC
Hematocrit: 37.1 % (ref 34.0–46.6)
Hemoglobin: 11.9 g/dL (ref 11.1–15.9)
MCH: 31.2 pg (ref 26.6–33.0)
MCHC: 32.1 g/dL (ref 31.5–35.7)
MCV: 97 fL (ref 79–97)
Platelets: 392 x10E3/uL (ref 150–450)
RBC: 3.82 x10E6/uL (ref 3.77–5.28)
RDW: 12.2 % (ref 11.7–15.4)
WBC: 8.7 x10E3/uL (ref 3.4–10.8)

## 2024-03-17 ENCOUNTER — Ambulatory Visit: Payer: Self-pay | Admitting: Cardiovascular Disease

## 2024-03-17 LAB — BASIC METABOLIC PANEL WITH GFR
BUN/Creatinine Ratio: 12 (ref 12–28)
BUN: 14 mg/dL (ref 8–27)
CO2: 20 mmol/L (ref 20–29)
Calcium: 9.4 mg/dL (ref 8.7–10.3)
Chloride: 100 mmol/L (ref 96–106)
Creatinine, Ser: 1.15 mg/dL — ABNORMAL HIGH (ref 0.57–1.00)
Sodium: 139 mmol/L (ref 134–144)
eGFR: 50 mL/min/1.73 — ABNORMAL LOW (ref >59)

## 2024-03-22 ENCOUNTER — Ambulatory Visit (HOSPITAL_COMMUNITY)
Admission: RE | Admit: 2024-03-22 | Discharge: 2024-03-22 | Disposition: A | Discharge status: Home / Self Care | Source: Ambulatory Visit | Attending: Cardiology | Admitting: Cardiology

## 2024-03-22 DIAGNOSIS — I4891 Unspecified atrial fibrillation: Secondary | ICD-10-CM | POA: Diagnosis not present

## 2024-03-22 DIAGNOSIS — J439 Emphysema, unspecified: Secondary | ICD-10-CM | POA: Insufficient documentation

## 2024-03-22 DIAGNOSIS — I5021 Acute systolic (congestive) heart failure: Secondary | ICD-10-CM

## 2024-03-22 DIAGNOSIS — I5022 Chronic systolic (congestive) heart failure: Secondary | ICD-10-CM | POA: Diagnosis not present

## 2024-03-22 DIAGNOSIS — R911 Solitary pulmonary nodule: Secondary | ICD-10-CM | POA: Diagnosis not present

## 2024-03-22 MED ORDER — IOHEXOL 350 MG/ML SOLN
100.0000 mL | Freq: Once | INTRAVENOUS | Status: AC | PRN
  Administered 2024-03-22: 100 mL via INTRAVENOUS

## 2024-04-06 ENCOUNTER — Telehealth (HOSPITAL_COMMUNITY): Payer: Self-pay

## 2024-04-06 NOTE — Telephone Encounter (Signed)
 Attempted to reach patient to discuss upcoming procedure, no answer. Left VM for patient to return call.

## 2024-04-07 NOTE — Telephone Encounter (Signed)
 Advised Dr. Nancey, it doesn't sound like patient is maintaining sinus rhythm ans she reported her heart beats really fast and she gets weak and sluggish and then it stops. She doesn't have anything at home to check. BP cuff doesn't indicate irregular HR.  Per Dr. Nancey, [facility]. If we think she's having AF, she's going to need a TEE or reschedule for when she will have been on anticoagulation for a month prior to the ablation. Given that she is symptomatic, I think TEE on the table is best.  Delon Decent, RN notified of need for TEE.   Patient informed of the above plan and reminded to take Eliquis  twice daily without missing any doses but hold all medications including Eliquis  on the morning of procedure. Instructed patient to also resume taking Carvedilol  twice daily, as she also misunderstood, thinking she was only supposed to take in the evening. Pt voiced understanding to all information provided.

## 2024-04-07 NOTE — Telephone Encounter (Signed)
 Patient returned call to discuss upcoming procedure.   CT: completed.  Labs: completed.   Any recent signs of acute illness or been started on antibiotics? No Any new medications started? No Any medications to hold? No Any missed doses of blood thinner? Patient reports feeling bad with no energy on last week. She reviewed her hospital dc AVS from 5/20 and thought she was supposed to only take Eliquis  once in the evening based on the check mark shown. I explained to patient, it was informing when her next dose was due. Advised patient to resume taking ANTICOAGULANT: Eliquis  (Apixaban ) twice daily without missing any doses going forward. She voiced understanding. Will make Dr. Nancey aware.  Medication instructions:  On the morning of your procedure DO NOT take any medication., including Eliquis  or the procedure may be rescheduled. Nothing to eat or drink after midnight prior to your procedure.  Confirmed patient is scheduled for Atrial Fibrillation Ablation, Atrial Flutter Ablation on Tuesday, August 12 with Dr. Eulas Nancey. Instructed patient to arrive at the Main Entrance A at [facility]: [address] [place], [place] [address] and check in at Admitting at 8:00 AM.   Advised of plan to go home the same day and will only stay overnight if medically necessary. You MUST have a responsible adult to drive you home and MUST be with you the first 24 hours after you arrive home or your procedure could be cancelled.  Patient verbalized understanding to all instructions provided and agreed to proceed with procedure.

## 2024-04-13 ENCOUNTER — Ambulatory Visit (HOSPITAL_COMMUNITY)
Admission: RE | Admit: 2024-04-13 | Discharge: 2024-04-13 | Disposition: A | Discharge status: Home / Self Care | Attending: Cardiovascular Disease | Admitting: Cardiovascular Disease

## 2024-04-13 ENCOUNTER — Ambulatory Visit (HOSPITAL_COMMUNITY)

## 2024-04-13 ENCOUNTER — Encounter (HOSPITAL_COMMUNITY): Payer: Self-pay | Admitting: Cardiovascular Disease

## 2024-04-13 ENCOUNTER — Other Ambulatory Visit (HOSPITAL_COMMUNITY)

## 2024-04-13 ENCOUNTER — Encounter (HOSPITAL_COMMUNITY)
Admission: RE | Disposition: A | Discharge status: Home / Self Care | Payer: Self-pay | Source: Home / Self Care | Attending: Cardiovascular Disease

## 2024-04-13 ENCOUNTER — Other Ambulatory Visit: Payer: Self-pay

## 2024-04-13 DIAGNOSIS — Z7901 Long term (current) use of anticoagulants: Secondary | ICD-10-CM | POA: Insufficient documentation

## 2024-04-13 DIAGNOSIS — R000 Tachycardia, unspecified: Secondary | ICD-10-CM | POA: Diagnosis not present

## 2024-04-13 DIAGNOSIS — I483 Typical atrial flutter: Secondary | ICD-10-CM

## 2024-04-13 DIAGNOSIS — I5021 Acute systolic (congestive) heart failure: Secondary | ICD-10-CM

## 2024-04-13 DIAGNOSIS — I5022 Chronic systolic (congestive) heart failure: Secondary | ICD-10-CM | POA: Insufficient documentation

## 2024-04-13 DIAGNOSIS — I4819 Other persistent atrial fibrillation: Secondary | ICD-10-CM | POA: Diagnosis not present

## 2024-04-13 DIAGNOSIS — D6869 Other thrombophilia: Secondary | ICD-10-CM | POA: Insufficient documentation

## 2024-04-13 DIAGNOSIS — I429 Cardiomyopathy, unspecified: Secondary | ICD-10-CM | POA: Diagnosis not present

## 2024-04-13 DIAGNOSIS — I4891 Unspecified atrial fibrillation: Secondary | ICD-10-CM

## 2024-04-13 DIAGNOSIS — Z79899 Other long term (current) drug therapy: Secondary | ICD-10-CM | POA: Insufficient documentation

## 2024-04-13 DIAGNOSIS — I110 Hypertensive heart disease with heart failure: Secondary | ICD-10-CM | POA: Insufficient documentation

## 2024-04-13 DIAGNOSIS — I4892 Unspecified atrial flutter: Secondary | ICD-10-CM

## 2024-04-13 DIAGNOSIS — I10 Essential (primary) hypertension: Secondary | ICD-10-CM | POA: Diagnosis not present

## 2024-04-13 HISTORY — PX: A-FLUTTER ABLATION: EP1230

## 2024-04-13 HISTORY — PX: ATRIAL FIBRILLATION ABLATION: EP1191

## 2024-04-13 HISTORY — PX: TRANSESOPHAGEAL ECHOCARDIOGRAM (CATH LAB): EP1270

## 2024-04-13 LAB — POCT I-STAT, CHEM 8
BUN: 15 mg/dL (ref 8–23)
Calcium, Ion: 1.1 mmol/L — ABNORMAL LOW (ref 1.15–1.40)
Chloride: 105 mmol/L (ref 98–111)
Creatinine, Ser: 1 mg/dL (ref 0.44–1.00)
Glucose, Bld: 91 mg/dL (ref 70–99)
HCT: 34 % — ABNORMAL LOW (ref 36.0–46.0)
Hemoglobin: 11.6 g/dL — ABNORMAL LOW (ref 12.0–15.0)
Potassium: 3.6 mmol/L (ref 3.5–5.1)
Sodium: 139 mmol/L (ref 135–145)
TCO2: 23 mmol/L (ref 22–32)

## 2024-04-13 LAB — POCT ACTIVATED CLOTTING TIME: Activated Clotting Time: 291 s

## 2024-04-13 MED ORDER — NITROGLYCERIN 1 MG/10 ML FOR IR/CATH LAB
INTRA_ARTERIAL | Status: DC | PRN
Start: 2024-04-13 — End: 2024-04-13
  Administered 2024-04-13 (×2): 20 mL

## 2024-04-13 MED ORDER — LIDOCAINE 2% (20 MG/ML) 5 ML SYRINGE
INTRAMUSCULAR | Status: DC | PRN
  Administered 2024-04-13 (×2): 50 mg via INTRAVENOUS

## 2024-04-13 MED ORDER — SUGAMMADEX SODIUM 200 MG/2ML IV SOLN
INTRAVENOUS | Status: DC | PRN
  Administered 2024-04-13 (×2): 200 mg via INTRAVENOUS

## 2024-04-13 MED ORDER — PHENYLEPHRINE HCL-NACL 20-0.9 MG/250ML-% IV SOLN
INTRAVENOUS | Status: DC | PRN
  Administered 2024-04-13 (×2): 20 ug/min via INTRAVENOUS

## 2024-04-13 MED ORDER — DEXAMETHASONE SODIUM PHOSPHATE 10 MG/ML IJ SOLN
INTRAMUSCULAR | Status: DC | PRN
  Administered 2024-04-13 (×2): 5 mg via INTRAVENOUS

## 2024-04-13 MED ORDER — SODIUM CHLORIDE 0.9 % IV SOLN: 250.0000 mL | INTRAVENOUS | Status: DC | PRN

## 2024-04-13 MED ORDER — TRAMADOL HCL 50 MG PO TABS
ORAL_TABLET | ORAL | Status: AC
  Filled 2024-04-13: qty 2

## 2024-04-13 MED ORDER — ROCURONIUM BROMIDE 10 MG/ML (PF) SYRINGE
PREFILLED_SYRINGE | INTRAVENOUS | Status: DC | PRN
  Administered 2024-04-13 (×2): 50 mg via INTRAVENOUS

## 2024-04-13 MED ORDER — SODIUM CHLORIDE 0.9 % IV SOLN: INTRAVENOUS | Status: DC

## 2024-04-13 MED ORDER — NITROGLYCERIN 1 MG/10 ML FOR IR/CATH LAB
INTRA_ARTERIAL | Status: AC
  Filled 2024-04-13: qty 20

## 2024-04-13 MED ORDER — FENTANYL CITRATE (PF) 250 MCG/5ML IJ SOLN
INTRAMUSCULAR | Status: DC | PRN
  Administered 2024-04-13 (×4): 50 ug via INTRAVENOUS

## 2024-04-13 MED ORDER — HEPARIN SODIUM (PORCINE) 1000 UNIT/ML IJ SOLN
INTRAMUSCULAR | Status: DC | PRN
Start: 2024-04-13 — End: 2024-04-13
  Administered 2024-04-13 (×2): 2000 [IU] via INTRAVENOUS
  Administered 2024-04-13 (×2): 12000 [IU] via INTRAVENOUS

## 2024-04-13 MED ORDER — PROPOFOL 10 MG/ML IV BOLUS
INTRAVENOUS | Status: DC | PRN
Start: 2024-04-13 — End: 2024-04-13
  Administered 2024-04-13 (×2): 100 mg via INTRAVENOUS

## 2024-04-13 MED ORDER — SODIUM CHLORIDE 0.9% FLUSH: 3.0000 mL | Freq: Two times a day (BID) | INTRAVENOUS | Status: DC

## 2024-04-13 MED ORDER — PHENYLEPHRINE 80 MCG/ML (10ML) SYRINGE FOR IV PUSH (FOR BLOOD PRESSURE SUPPORT)
PREFILLED_SYRINGE | INTRAVENOUS | Status: DC | PRN
  Administered 2024-04-13: 200 ug via INTRAVENOUS
  Administered 2024-04-13 (×2): 160 ug via INTRAVENOUS
  Administered 2024-04-13: 200 ug via INTRAVENOUS

## 2024-04-13 MED ORDER — ACETAMINOPHEN 325 MG PO TABS: 650.0000 mg | ORAL_TABLET | ORAL | Status: DC | PRN

## 2024-04-13 MED ORDER — PROTAMINE SULFATE 10 MG/ML IV SOLN
INTRAVENOUS | Status: DC | PRN
  Administered 2024-04-13 (×2): 50 mg via INTRAVENOUS

## 2024-04-13 MED ORDER — FENTANYL CITRATE (PF) 100 MCG/2ML IJ SOLN
INTRAMUSCULAR | Status: AC
Start: 2024-04-13 — End: 2024-04-13
  Filled 2024-04-13: qty 2

## 2024-04-13 MED ORDER — TRAMADOL HCL 50 MG PO TABS
100.0000 mg | ORAL_TABLET | Freq: Four times a day (QID) | ORAL | Status: DC
  Administered 2024-04-13 (×2): 100 mg via ORAL

## 2024-04-13 MED ORDER — SODIUM CHLORIDE 0.9% FLUSH
3.0000 mL | INTRAVENOUS | Status: DC | PRN
Start: 2024-04-13 — End: 2024-04-13

## 2024-04-13 MED ORDER — HEPARIN SODIUM (PORCINE) 1000 UNIT/ML IJ SOLN
INTRAMUSCULAR | Status: AC
  Filled 2024-04-13: qty 10

## 2024-04-13 MED ORDER — ONDANSETRON HCL 4 MG/2ML IJ SOLN: 4.0000 mg | Freq: Four times a day (QID) | INTRAMUSCULAR | Status: DC | PRN

## 2024-04-13 MED ORDER — ONDANSETRON HCL 4 MG/2ML IJ SOLN
INTRAMUSCULAR | Status: DC | PRN
  Administered 2024-04-13 (×2): 4 mg via INTRAVENOUS

## 2024-04-13 MED ORDER — ATROPINE SULFATE 1 MG/10ML IJ SOSY
PREFILLED_SYRINGE | INTRAMUSCULAR | Status: DC | PRN
  Administered 2024-04-13 (×2): 1 mg via INTRAVENOUS

## 2024-04-13 SURGICAL SUPPLY — 19 items
BAG SNAP BAND KOVER 36X36 (MISCELLANEOUS) IMPLANT
CABLE FARASTAR GEN2 SNGL USE (CABLE) IMPLANT
CATH FARAWAVE 2.0 31 (CATHETERS) IMPLANT
CATH GE 8FR SOUNDSTAR (CATHETERS) IMPLANT
CATH OCTARAY 2.0 F 3-3-3-3-3 (CATHETERS) IMPLANT
CATH WEBSTER BI DIR CS D-F CRV (CATHETERS) IMPLANT
CLOSURE PERCLOSE PROSTYLE (VASCULAR PRODUCTS) IMPLANT
COVER SWIFTLINK CONNECTOR (BAG) ×1 IMPLANT
DEVICE CLOSURE MYNXGRIP 6/7F (Vascular Products) IMPLANT
DILATOR VESSEL 38 20CM 16FR (INTRODUCER) IMPLANT
GUIDEWIRE INQWIRE 1.5J.035X260 (WIRE) IMPLANT
KIT VERSACROSS CNCT FARADRIVE (KITS) IMPLANT
PACK EP LF (CUSTOM PROCEDURE TRAY) ×1 IMPLANT
PAD DEFIB RADIO PHYSIO CONN (PAD) ×1 IMPLANT
PATCH CARTO3 (PAD) IMPLANT
SHEATH FARADRIVE STEERABLE (SHEATH) IMPLANT
SHEATH PINNACLE 8F 10CM (SHEATH) IMPLANT
SHEATH PINNACLE 9F 10CM (SHEATH) IMPLANT
SHEATH PROBE COVER 6X72 (BAG) IMPLANT

## 2024-04-13 NOTE — Progress Notes (Signed)
 Up and walked and tolerated well; bilat groins stable, no bleeding or hematoma

## 2024-04-13 NOTE — Progress Notes (Signed)
 Sat client up in bed and states pain decreased to 2/10; pain med given

## 2024-04-13 NOTE — Addendum Note (Signed)
 Addendum  created 04/13/24 1300 by Roslynn Waddell LABOR, CRNA   Intraprocedure Meds edited

## 2024-04-13 NOTE — Discharge Instructions (Signed)

## 2024-04-13 NOTE — H&P (Signed)
 Electrophysiology Office Note:    Date:  04/13/2024   ID:  [name], DOB Sep 02, 1951, MRN [id number]  PCP:  Duanne Butler ONEIDA, MD   Erin Wong Cardiologist:  Maude Emmer, MD     Referring MD: Nancey Eulas BRAVO, MD   History of Present Illness:    Erin Wong is a 73 y.o. female with a medical history significant for paroxysmal atrial fibrillation cardiomyopathy with recovered ejection fraction, hypertension, referred for arrhythmia management.       Discussed the use of AI scribe software for clinical note transcription with the patient, who gave verbal consent to proceed.  History of Present Illness Erin Wong is a 73 year old female with atrial fibrillation who presents for follow-up after a recent admission for atrial flutter.  She has a history of persistent atrial fibrillation, initially diagnosed in May 2021, which led to the development of cardiomyopathy with an ejection fraction of 30-35%. Initial treatment with DC cardioversion was unsuccessful, but she later converted to sinus rhythm on amiodarone , resulting in improved left ventricular function with guideline-directed medical therapy.  In May 2025, she was admitted with atrial flutter, presenting with palpitations, chest pain, pressure, and shortness of breath. Rapid ventricular rates were noted, and she was converted to sinus rhythm with ibuprofen. A monitor in July 2021 showed less than 1% atrial fibrillation burden. Her doses of beta blocker and amiodarone  were decreased due to bradycardia.  She experiences palpitations, chest pain, pressure, shortness of breath, and fatigue during arrhythmias. She is currently on amiodarone  but has had a recurrence of arrhythmia while on this medication.  A small nodule on her lung, described as a 'haze' or 'shadow', is being monitored by Dr. Lamar Bruins. The nodule has not changed in size or moved, with a follow-up CT scan planned for December 2025.          Today, she reports that she feels reasonably well.  She has no palpitations.  I reviewed the patient's CT and labs. There was no LAA thrombus. she  missed two doses of anticoagulation about two weeks ago when she mistakenly took the medication once a day. she took her dose last night. She is in sinus rhythm today.  There have been no changes in the patient's diagnoses, medications, or condition since our recent clinic visit.   EKGs/Labs/Other Studies Reviewed Today:     Echocardiogram:  TTE March 2025 LVEF 60 to 65%.  No wall motion abnormalities.  Heart is grossly normal in structure and function.    Advanced imaging:  PET Mild focal ischemia of the lateral wall    EKG:         Physical Exam:    VS:  BP (!) 173/72   Temp 98.7 F (37.1 C) (Oral)   Resp 20   Ht 5' 3 (1.6 m)   Wt 70.8 kg   BMI 27.63 kg/m     Wt Readings from Last 3 Encounters:  04/13/24 70.8 kg  02/17/24 70.8 kg  02/11/24 70.9 kg     GEN: Well nourished, well developed in no acute distress CARDIAC: RRR, no murmurs, rubs, gallops RESPIRATORY:  Normal work of breathing MUSCULOSKELETAL: no edema    ASSESSMENT & PLAN:     History of atrial fibrillation, atrial flutter on admission May 2025 Atrial fibrillation diagnosed in 2021 Failed cardioversion in 2021, converted to sinus on amiodarone  She has had recurrence of atrial fibrillation on amiodarone  We discussed management options, and using a  shared decision making approach decided to proceed with rhythm control by ablation We will plan for flutter line with ablation \\Will  plan to discontinue amiodarone  after ablation  We discussed the indication, rationale, logistics, anticipated benefits, and potential risks of the ablation procedure including but not limited to -- bleed at the groin access site, chest pain, damage to nearby organs such as the diaphragm, lungs, or esophagus, need for a drainage tube, or prolonged hospitalization. I  explained that the risk for stroke, heart attack, need for open chest surgery, or even death is very low but not zero. she  expressed understanding and wishes to proceed.   Chronic CHF with reduced EF, EF recovered Tachycardia mediated cardiomyopathy in 2021 LVEF 60% on PET May 2025 No CHF symptoms.  Secondary hypercoagulable state Continue apixaban  5 mg twice daily    Signed, Eulas FORBES Furbish, MD  04/13/2024 9:03 AM     [facility]

## 2024-04-13 NOTE — Anesthesia Postprocedure Evaluation (Signed)
 Anesthesia Post Note  Patient: Erin Wong  Procedure(s) Performed: ATRIAL FIBRILLATION ABLATION A-FLUTTER ABLATION TRANSESOPHAGEAL ECHOCARDIOGRAM     Patient location during evaluation: PACU Anesthesia Type: General Level of consciousness: awake and alert, oriented and patient cooperative Pain management: pain level controlled Vital Signs Assessment: post-procedure vital signs reviewed and stable Respiratory status: spontaneous breathing, nonlabored ventilation and respiratory function stable Cardiovascular status: blood pressure returned to baseline and stable Postop Assessment: no apparent nausea or vomiting Anesthetic complications: no   There were no known notable events for this encounter.  Last Vitals:  Vitals:   04/13/24 1200 04/13/24 1210  BP: 129/73 (!) 145/66  Pulse: 60 (!) 58  Resp: 17 15  Temp: 37 C   SpO2: 94% 96%    Last Pain:  Vitals:   04/13/24 1200  TempSrc: Oral  PainSc: 0-No pain                 Almarie CHRISTELLA Marchi

## 2024-04-13 NOTE — Anesthesia Procedure Notes (Signed)
 Procedure Name: Intubation Date/Time: 04/13/2024 9:54 AM  Performed by: Roslynn Waddell LABOR, CRNAPre-anesthesia Checklist: Patient identified, Emergency Drugs available, Suction available and Patient being monitored Patient Re-evaluated:Patient Re-evaluated prior to induction Oxygen  Delivery Method: Circle System Utilized Preoxygenation: Pre-oxygenation with 100% oxygen  Induction Type: IV induction Ventilation: Oral airway inserted - appropriate to patient size Laryngoscope Size: Mac and 3 Grade View: Grade I Tube type: Oral Tube size: 7.0 mm Number of attempts: 1 Airway Equipment and Method: Stylet and Oral airway Placement Confirmation: ETT inserted through vocal cords under direct vision, positive ETCO2 and breath sounds checked- equal and bilateral Secured at: 21 cm Tube secured with: Tape Dental Injury: Teeth and Oropharynx as per pre-operative assessment  Comments: Atraumatic induction/intubation. Edentulous upper. Lower dentition and oral mucosa as per preop.

## 2024-04-13 NOTE — Anesthesia Preprocedure Evaluation (Signed)
 Anesthesia Evaluation  Patient identified by MRN, date of birth, ID band Patient awake    Reviewed: Allergy & Precautions, NPO status , Patient's Chart, lab work & pertinent test results  Airway Mallampati: II  TM Distance: >3 FB Neck ROM: Full    Dental no notable dental hx.    Pulmonary asthma , former smoker   Pulmonary exam normal breath sounds clear to auscultation       Cardiovascular hypertension, Pt. on medications and Pt. on home beta blockers Normal cardiovascular exam+ dysrhythmias (eliquis ) Atrial Fibrillation + Valvular Problems/Murmurs (mild MR, mild AI) MR and AI  Rhythm:Regular Rate:Normal  Echo 2025 1. Left ventricular ejection fraction, by estimation, is 60 to 65%. The  left ventricle has normal function. The left ventricle has no regional  wall motion abnormalities. Left ventricular diastolic parameters are  indeterminate.   2. Right ventricular systolic function is normal. The right ventricular  size is normal. Tricuspid regurgitation signal is inadequate for assessing  PA pressure.   3. The mitral valve is normal in structure. Mild mitral valve  regurgitation. No evidence of mitral stenosis.   4. The aortic valve is tricuspid. Aortic valve regurgitation is mild. No  aortic stenosis is present.   5. The inferior vena cava is normal in size with greater than 50%  respiratory variability, suggesting right atrial pressure of 3 mmHg.     Neuro/Psych  negative psych ROS   GI/Hepatic ,GERD  Controlled,,  Endo/Other    Renal/GU CRFRenal disease (CKD 3)  negative genitourinary   Musculoskeletal  (+) Arthritis , Osteoarthritis,    Abdominal   Peds  Hematology   Anesthesia Other Findings   Reproductive/Obstetrics negative OB ROS                              Anesthesia Physical Anesthesia Plan  ASA: 3  Anesthesia Plan: General   Post-op Pain Management: Minimal or no  pain anticipated   Induction: Intravenous  PONV Risk Score and Plan: 3 and Ondansetron , Dexamethasone , Midazolam  and Treatment may vary due to age or medical condition  Airway Management Planned: Oral ETT  Additional Equipment: None  Intra-op Plan:   Post-operative Plan: Extubation in OR  Informed Consent: I have reviewed the patients History and Physical, chart, labs and discussed the procedure including the risks, benefits and alternatives for the proposed anesthesia with the patient or authorized representative who has indicated his/her understanding and acceptance.     Dental advisory given  Plan Discussed with: CRNA  Anesthesia Plan Comments:          Anesthesia Quick Evaluation

## 2024-04-13 NOTE — Progress Notes (Signed)
 Client c/o 9/10 chest discomfort going through to back; Dr Nancey in and orders noted

## 2024-04-13 NOTE — Transfer of Care (Signed)
 Immediate Anesthesia Transfer of Care Note  Patient: Erin Wong  Procedure(s) Performed: ATRIAL FIBRILLATION ABLATION A-FLUTTER ABLATION TRANSESOPHAGEAL ECHOCARDIOGRAM  Patient Location: Cath Lab  Anesthesia Type:General  Level of Consciousness: awake, alert , and oriented  Airway & Oxygen  Therapy: Patient Spontanous Breathing and Patient connected to face mask oxygen   Post-op Assessment: Report given to RN and Post -op Vital signs reviewed and stable  Post vital signs: Reviewed and stable  Last Vitals:  Vitals Value Taken Time  BP 123/67 04/13/24 11:29  Temp    Pulse 64 04/13/24 11:29  Resp 16 04/13/24 11:29  SpO2 92 % 04/13/24 11:29  Vitals shown include unfiled device data.  Last Pain:  Vitals:   04/13/24 0839  TempSrc:   PainSc: 0-No pain      Patients Stated Pain Goal: 4 (04/13/24 9160)  Complications: There were no known notable events for this encounter.

## 2024-04-14 ENCOUNTER — Telehealth (HOSPITAL_COMMUNITY): Payer: Self-pay

## 2024-04-14 NOTE — Telephone Encounter (Signed)
 Spoke with patient to complete post procedure follow up call.  Patient reports no complications with groin sites.   Instructions reviewed with patient:  Remove large bandage at puncture site after 24 hours. It is normal to have bruising, tenderness, mild swelling, and a pea or marble sized lump/knot at the groin site which can take up to three months to resolve.  Get help right away if you notice sudden swelling at the puncture site.  Check your puncture site every day for signs of infection: fever, redness, swelling, pus drainage, warmth, foul odor or excessive pain. If this occurs, please call the office at [phone], to speak with the nurse. Get help right away if your puncture site is bleeding and the bleeding does not stop after applying firm pressure to the area.  You may continue to have skipped beats/ atrial fibrillation during the first several months after your procedure.  It is very important not to miss any doses of your blood thinner Eliquis .  You will follow up with the Afib clinic on 05/10/24 and follow up with the APP on 07/13/24.   Patient verbalized understanding to all instructions provided.

## 2024-04-19 ENCOUNTER — Telehealth: Payer: Self-pay | Admitting: Family Medicine

## 2024-04-19 ENCOUNTER — Other Ambulatory Visit: Payer: Self-pay | Admitting: Family Medicine

## 2024-04-19 MED ORDER — TRAMADOL HCL 50 MG PO TABS
100.0000 mg | ORAL_TABLET | Freq: Four times a day (QID) | ORAL | 0 refills | Status: DC
Start: 2024-04-19 — End: 2024-05-21

## 2024-04-19 NOTE — Telephone Encounter (Signed)
 Prescription Request  04/19/2024  LOV: 10/30/2023  What is the name of the medication or equipment?   traMADol  (ULTRAM ) 50 MG tablet   Have you contacted your pharmacy to request a refill? Yes   Which pharmacy would you like this sent to?  [id number] [address], [place] - [address] [place] [address] Phone: [phone] Fax: [phone]    Patient notified that their request is being sent to the clinical staff for review and that they should receive a response within 2 business days.   Please advise pharmacist.

## 2024-04-20 ENCOUNTER — Other Ambulatory Visit: Payer: Self-pay | Admitting: Family Medicine

## 2024-04-20 NOTE — Telephone Encounter (Signed)
 Copied from CRM #[id number]. Topic: Clinical - Medication Refill >> Apr 20, 2024 10:53 AM Charlet HERO wrote: Medication: traMADol  (ULTRAM ) 50 MG tablet  Has the patient contacted their pharmacy? Yes Pharmacy states that has not been approved  This is the patient's preferred pharmacy:  [id number] [address], [place] - [address] [place] [address] Phone: [phone] Fax: [phone]  Is this the correct pharmacy for this prescription? Yes If no, delete pharmacy and type the correct one.   Has the prescription been filled recently? Yes  Is the patient out of the medication? Yes  Has the patient been seen for an appointment in the last year OR does the patient have an upcoming appointment? Yes  Can we respond through MyChart? No  Agent: Please be advised that Rx refills may take up to 3 business days. We ask that you follow-up with your pharmacy.

## 2024-04-21 NOTE — Telephone Encounter (Signed)
 Requested medication (s) are due for refill today - no  Requested medication (s) are on the active medication list -yes  Future visit scheduled -no  Last refill: 04/19/24 #240  Notes to clinic: non delegated Rx, duplicate request- already filled by provider  Requested Prescriptions  Pending Prescriptions Disp Refills   traMADol  (ULTRAM ) 50 MG tablet 240 tablet 0    Sig: Take 2 tablets (100 mg total) by mouth 4 (four) times daily. Needs to be seen before more refills     Not Delegated - Analgesics:  Opioid Agonists Failed - 04/21/2024  4:12 PM      Failed - This refill cannot be delegated      Failed - Urine Drug Screen completed in last 360 days      Failed - Valid encounter within last 3 months    Recent Outpatient Visits           5 months ago Stone of salivary gland or duct   [place] [facility] Duanne, Butler DASEN, MD   7 months ago Colon cancer screening   [place] [facility] Duanne Butler DASEN, MD   1 year ago Dysequilibrium   [place] [facility] Duanne Butler DASEN, MD   1 year ago Dysequilibrium   [place] [facility] Duanne Butler DASEN, MD   1 year ago Stage 3 chronic kidney disease, unspecified whether stage 3a or 3b CKD [facility])   [place] [facility] Pickard, Butler DASEN, MD       Future Appointments             In 2 months Tillery, Ozell Barter, PA-C [facility] at [facility] A Dept of [employer], [facility]               Requested Prescriptions  Pending Prescriptions Disp Refills   traMADol  (ULTRAM ) 50 MG tablet 240 tablet 0    Sig: Take 2 tablets (100 mg total) by mouth 4 (four) times daily. Needs to be seen before more refills     Not Delegated - Analgesics:  Opioid Agonists Failed - 04/21/2024  4:12 PM      Failed - This refill cannot be delegated      Failed - Urine Drug Screen completed in last 360 days      Failed - Valid encounter within  last 3 months    Recent Outpatient Visits           5 months ago Stone of salivary gland or duct   [place] [facility] Duanne, Butler DASEN, MD   7 months ago Colon cancer screening   [place] [facility] Duanne Butler DASEN, MD   1 year ago Dysequilibrium   [place] [facility] Duanne Butler DASEN, MD   1 year ago Dysequilibrium   [place] [facility] Duanne Butler DASEN, MD   1 year ago Stage 3 chronic kidney disease, unspecified whether stage 3a or 3b CKD [facility])   [place] [facility] Pickard, Butler DASEN, MD       Future Appointments             In 2 months Tillery, Ozell Barter, PA-C [facility] at [facility] A Dept of [employer]. [facility], H[facility]

## 2024-05-10 ENCOUNTER — Encounter (HOSPITAL_COMMUNITY): Payer: Self-pay | Admitting: Physician Assistant

## 2024-05-10 ENCOUNTER — Ambulatory Visit (HOSPITAL_COMMUNITY)
Admission: RE | Admit: 2024-05-10 | Discharge: 2024-05-10 | Disposition: A | Discharge status: Home / Self Care | Source: Ambulatory Visit | Attending: Physician Assistant | Admitting: Physician Assistant

## 2024-05-10 VITALS — BP 130/62 | HR 58 | Ht 63.0 in | Wt 159.0 lb

## 2024-05-10 DIAGNOSIS — I483 Typical atrial flutter: Secondary | ICD-10-CM | POA: Diagnosis not present

## 2024-05-10 DIAGNOSIS — I480 Paroxysmal atrial fibrillation: Secondary | ICD-10-CM

## 2024-05-10 DIAGNOSIS — D6869 Other thrombophilia: Secondary | ICD-10-CM | POA: Diagnosis not present

## 2024-05-10 DIAGNOSIS — I4819 Other persistent atrial fibrillation: Secondary | ICD-10-CM

## 2024-05-10 NOTE — Progress Notes (Signed)
 Primary Care Physician: Duanne Butler DASEN, MD Primary Cardiologist: Maude Emmer, MD Electrophysiologist: Eulas FORBES Furbish, MD  Referring Physician: Dr Furbish Rock DASEN Erin Wong is a 73 y.o. female with a history of CHF, HTN, HLD, CKD, CAD, atrial fibrillation who presents for follow up in the [facility].  The patient was initially diagnosed with atrial fibrillation 01/2020 which led to the development of cardiomyopathy with an ejection fraction of 30-35%. Initial treatment with DC cardioversion was unsuccessful, but she later converted to sinus rhythm on amiodarone , resulting in improved left ventricular function. In May 2025, she was admitted with atrial flutter, presenting with palpitations, chest pain, pressure, and shortness of breath. Converted to SR after receiving IV metoprolol . She was seen by Dr Furbish and underwent afib and flutter ablation on 04/13/24. Patient is on Eliquis  for stroke prevention.    Patient presents today for follow up for atrial fibrillation. She remains in SR today and feels well. She reports having two brief episodes of tachypalpitations which resolved quickly. No chest pain or groin issues.   Today, she denies symptoms of chest pain, shortness of breath, orthopnea, PND, lower extremity edema, dizziness, presyncope, syncope, snoring, daytime somnolence, bleeding, or neurologic sequela. The patient is tolerating medications without difficulties and is otherwise without complaint today.    Atrial Fibrillation Risk Factors:  she does not have symptoms or diagnosis of sleep apnea. she does not have a history of rheumatic fever.   Atrial Fibrillation Management history:  Previous antiarrhythmic drugs: amiodarone   Previous cardioversions: 2021 Previous ablations: 04/13/24 Anticoagulation history: Eliquis   ROS- All systems are reviewed and negative except as per the HPI above.  Past Medical History:  Diagnosis Date   Allergy     Arthritis    Asthma    Atrial fibrillation (HCC)    Benign essential HTN    CHF (congestive heart failure) (HCC)    CKD (chronic kidney disease) stage 3, GFR 30-59 ml/min (HCC)    Diverticulosis    Dysrhythmia     Afib -  had Ablation   GERD (gastroesophageal reflux disease)    HLD (hyperlipidemia)    HTN (hypertension)    Pneumonia     Current Outpatient Medications  Medication Sig Dispense Refill   albuterol  (VENTOLIN  HFA) 108 (90 Base) MCG/ACT inhaler Inhale 1 puff into the lungs every 6 (six) hours as needed for wheezing or shortness of breath. 18 g 3   apixaban  (ELIQUIS ) 5 MG TABS tablet Take 1 tablet (5 mg total) by mouth 2 (two) times daily. 180 tablet 3   atorvastatin  (LIPITOR) 40 MG tablet Take 1 tablet (40 mg total) by mouth daily. 90 tablet 3   Biotin 5000 MCG TABS Take 5,000 mcg by mouth every evening.     carvedilol  (COREG ) 3.125 MG tablet Take 1 tablet (3.125 mg total) by mouth 2 (two) times daily. 180 tablet 3   Cholecalciferol  25 MCG (1000 UT) tablet Take 1 tablet (1,000 Units total) by mouth daily. 90 tablet 1   Cyanocobalamin (VITAMIN B-12 PO) Take 1 tablet by mouth daily.     fluticasone  (FLONASE ) 50 MCG/ACT nasal spray Place 2 sprays into both nostrils daily as needed for allergies. 16 g 3   fluticasone -salmeterol (WIXELA INHUB) 100-50 MCG/ACT AEPB Inhale 1 puff into the lungs 2 (two) times daily. 180 each 2   furosemide  (LASIX ) 40 MG tablet Take 1 tablet (40 mg total) by mouth daily. 90 tablet 3   Misc Natural Products (  NEURIVA PO) Take 1 tablet by mouth daily.     polyethylene glycol (MIRALAX  / GLYCOLAX ) 17 g packet Take 17 g by mouth daily.     traMADol  (ULTRAM ) 50 MG tablet Take 2 tablets (100 mg total) by mouth 4 (four) times daily. Needs to be seen before more refills 240 tablet 0   No current facility-administered medications for this encounter.    Physical Exam: BP 130/62   Pulse (!) 58   Ht 5' 3 (1.6 m)   Wt 72.1 kg   BMI 28.17 kg/m   GEN: Well  nourished, well developed in no acute distress CARDIAC: Regular rate and rhythm, no murmurs, rubs, gallops RESPIRATORY:  Clear to auscultation without rales, wheezing or rhonchi  ABDOMEN: Soft, non-tender, non-distended EXTREMITIES:  No edema; No deformity   Wt Readings from Last 3 Encounters:  05/10/24 72.1 kg  04/13/24 70.8 kg  02/17/24 70.8 kg     EKG today demonstrates  SB Vent. rate 58 BPM PR interval * ms QRS duration 106 ms QT/QTcB 396/388 ms   Echo 11/04/23 demonstrated   1. Left ventricular ejection fraction, by estimation, is 60 to 65%. The  left ventricle has normal function. The left ventricle has no regional  wall motion abnormalities. Left ventricular diastolic parameters are  indeterminate.   2. Right ventricular systolic function is normal. The right ventricular  size is normal. Tricuspid regurgitation signal is inadequate for assessing  PA pressure.   3. The mitral valve is normal in structure. Mild mitral valve  regurgitation. No evidence of mitral stenosis.   4. The aortic valve is tricuspid. Aortic valve regurgitation is mild. No  aortic stenosis is present.   5. The inferior vena cava is normal in size with greater than 50%  respiratory variability, suggesting right atrial pressure of 3 mmHg.     CHA2DS2-VASc Score = 4  The patient's score is based upon: CHF History: 1 HTN History: 1 Diabetes History: 0 Stroke History: 0 Vascular Disease History: 0 Age Score: 1 Gender Score: 1       ASSESSMENT AND PLAN: Persistent Atrial Fibrillation/atrial flutter (ICD10:  I48.19) The patient's CHA2DS2-VASc score is 4, indicating a 4.8% annual risk of stroke.   S/p afib and flutter ablation 04/13/24, now off amiodarone . Patient appears to be maintaining SR Continue Eliquis  5 mg BID with no missed doses for 3 months post ablation.  Continue carvedilol  3.125 mg BID  Secondary Hypercoagulable State (ICD10:  D68.69) The patient is at significant risk for  stroke/thromboembolism based upon her CHA2DS2-VASc Score of 4.  Continue Apixaban  (Eliquis ). No bleeding issues.   HFrecEF EF 60-65% GDMT per primary cardiology team Fluid status appears stable today  CAD CAC score 905 No anginal symptoms Followed by Dr Delford   Follow up with Jodie Passey as scheduled.     [facility] [address] [place], [place] [address] [phone]

## 2024-05-21 ENCOUNTER — Ambulatory Visit (INDEPENDENT_AMBULATORY_CARE_PROVIDER_SITE_OTHER): Admitting: Family Medicine

## 2024-05-21 ENCOUNTER — Encounter: Payer: Self-pay | Admitting: Family Medicine

## 2024-05-21 VITALS — BP 120/62 | HR 64 | Temp 98.2°F | Ht 63.0 in | Wt 147.2 lb

## 2024-05-21 DIAGNOSIS — Z23 Encounter for immunization: Secondary | ICD-10-CM

## 2024-05-21 DIAGNOSIS — I4891 Unspecified atrial fibrillation: Secondary | ICD-10-CM

## 2024-05-21 DIAGNOSIS — E7800 Pure hypercholesterolemia, unspecified: Secondary | ICD-10-CM

## 2024-05-21 DIAGNOSIS — I5022 Chronic systolic (congestive) heart failure: Secondary | ICD-10-CM

## 2024-05-21 DIAGNOSIS — N183 Chronic kidney disease, stage 3 unspecified: Secondary | ICD-10-CM | POA: Diagnosis not present

## 2024-05-21 DIAGNOSIS — G4452 New daily persistent headache (NDPH): Secondary | ICD-10-CM

## 2024-05-21 MED ORDER — TRAMADOL HCL 50 MG PO TABS: 100.0000 mg | ORAL_TABLET | Freq: Four times a day (QID) | ORAL | 0 refills | Status: DC

## 2024-05-21 NOTE — Progress Notes (Signed)
 Subjective:    Patient ID: Erin Wong, female    DOB: [birth year], 73 y.o.   MRN: [id number]  Patient is a 73 year old Caucasian female who has a history of patient is status post ablation, congestive heart failure, chronic kidney disease, and hypertension who presents today tearful.  She reports that she has been having an almost daily headache for the last 3 to 4 months.  The headache is located on her right temple about 2 inches above her ear.  She states that she feels like there is a fluid dripping down on the inside of her skull into her ear.  She can feel it moving under her skin.  She realizes that this sounds strange.  I believe she is trying to describe paresthesias or hyperesthesias on the skin however palpation in that area.  Daily headache with her at least 10 times a day lasting a.  Is a pressure-like headache above and behind her right eye.  She also associates some blurred vision with this. Past Medical History:  Diagnosis Date   Allergy    Arthritis    Asthma    Atrial fibrillation (HCC)    Benign essential HTN    CHF (congestive heart failure) (HCC)    CKD (chronic kidney disease) stage 3, GFR 30-59 ml/min (HCC)    Diverticulosis    Dysrhythmia     Afib -  had Ablation   GERD (gastroesophageal reflux disease)    HLD (hyperlipidemia)    HTN (hypertension)    Pneumonia    Past Surgical History:  Procedure Laterality Date   A-FLUTTER ABLATION N/A 04/13/2024   Procedure: A-FLUTTER ABLATION;  Surgeon: Mealor, Eulas BRAVO, MD;  Location: [facility] INVASIVE CV LAB;  Service: Cardiovascular;  Laterality: N/A;   ABDOMINAL HYSTERECTOMY  10/04/1995   Hysterectomy and Bilateral oophorectomy   ATRIAL FIBRILLATION ABLATION N/A 04/13/2024   Procedure: ATRIAL FIBRILLATION ABLATION;  Surgeon: Nancey Eulas BRAVO, MD;  Location: [facility];  Service: Cardiovascular;  Laterality: N/A;   CARDIOVERSION N/A 01/17/2020   Procedure: CARDIOVERSION;  Surgeon: Delford Maude BROCKS, MD;  Location: [facility]  ENDOSCOPY;  Service: Cardiovascular;  Laterality: N/A;   COLON SURGERY  09/2009   hole in colon   FRACTURE SURGERY Right 08/02/2011   Right Leg--Plates & Screws   HAND SURGERY Right 09/02/2006   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--Arthritis   HARDWARE REMOVAL Right 12/04/2020   Procedure: rigth knee hardware removal;  Surgeon: Jerri Kay HERO, MD;  Location: [facility] OR;  Service: Orthopedics;  Laterality: Right;   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   at time of colon surgery   TEE WITHOUT CARDIOVERSION N/A 01/17/2020   Procedure: TRANSESOPHAGEAL ECHOCARDIOGRAM (TEE);  Surgeon: Delford Maude BROCKS, MD;  Location: [facility] ENDOSCOPY;  Service: Cardiovascular;  Laterality: N/A;   TOTAL KNEE ARTHROPLASTY Right 02/19/2021   Procedure: RIGHT TOTAL KNEE ARTHROPLASTY;  Surgeon: Jerri Kay HERO, MD;  Location: [facility] OR;  Service: Orthopedics;  Laterality: Right;   TRANSESOPHAGEAL ECHOCARDIOGRAM (CATH LAB) N/A 04/13/2024   Procedure: TRANSESOPHAGEAL ECHOCARDIOGRAM;  Surgeon: Nancey Eulas BRAVO, MD;  Location: [facility] INVASIVE CV LAB;  Service: Cardiovascular;  Laterality: N/A;   Current Outpatient Medications on File Prior to Visit  Medication Sig Dispense Refill   albuterol  (VENTOLIN  HFA) 108 (90 Base) MCG/ACT inhaler Inhale 1 puff into the lungs every 6 (six) hours as needed for wheezing or shortness of breath. 18 g 3   apixaban  (ELIQUIS ) 5 MG TABS tablet Take 1 tablet (5 mg total) by mouth 2 (  two) times daily. 180 tablet 3   atorvastatin  (LIPITOR) 40 MG tablet Take 1 tablet (40 mg total) by mouth daily. 90 tablet 3   Biotin 5000 MCG TABS Take 5,000 mcg by mouth every evening.     carvedilol  (COREG ) 3.125 MG tablet Take 1 tablet (3.125 mg total) by mouth 2 (two) times daily. 180 tablet 3   Cholecalciferol  25 MCG (1000 UT) tablet Take 1 tablet (1,000 Units total) by mouth daily. 90 tablet 1   Cyanocobalamin (VITAMIN B-12 PO) Take 1 tablet by mouth daily.     fluticasone  (FLONASE ) 50 MCG/ACT nasal spray Place 2 sprays into both nostrils daily  as needed for allergies. 16 g 3   fluticasone -salmeterol (WIXELA INHUB) 100-50 MCG/ACT AEPB Inhale 1 puff into the lungs 2 (two) times daily. 180 each 2   furosemide  (LASIX ) 40 MG tablet Take 1 tablet (40 mg total) by mouth daily. 90 tablet 3   Misc Natural Products (NEURIVA PO) Take 1 tablet by mouth daily.     polyethylene glycol (MIRALAX  / GLYCOLAX ) 17 g packet Take 17 g by mouth daily.     traMADol  (ULTRAM ) 50 MG tablet Take 2 tablets (100 mg total) by mouth 4 (four) times daily. Needs to be seen before more refills 240 tablet 0   No current facility-administered medications on file prior to visit.   No Known Allergies Social History   Socioeconomic History   Marital status: Married    Spouse name: Not on file   Number of children: 3   Years of education: Not on file   Highest education level: Not on file  Occupational History   Occupation: retired  Tobacco Use   Smoking status: Former    Current packs/day: 0.00    Types: Cigarettes    Quit date: 01/10/1991    Years since quitting: 33.3   Smokeless tobacco: Never   Tobacco comments:    Former smoker 05/10/24  Vaping Use   Vaping status: Never Used  Substance and Sexual Activity   Alcohol use: Not Currently    Comment: quit in May/2021   Drug use: No   Sexual activity: Never  Other Topics Concern   Not on file  Social History Narrative   Not on file   Social Drivers of Health   Financial Resource Strain: Low Risk  (01/16/2023)   Overall Financial Resource Strain (CARDIA)    Difficulty of Paying Living Expenses: Not hard at all  Food Insecurity: No Food Insecurity (01/19/2024)   Hunger Vital Sign    Worried About Running Out of Food in the Last Year: Never true    Ran Out of Food in the Last Year: Never true  Transportation Needs: No Transportation Needs (01/19/2024)   PRAPARE - Administrator, Civil Service (Medical): No    Lack of Transportation (Non-Medical): No  Physical Activity: Insufficiently  Active (01/16/2023)   Exercise Vital Sign    Days of Exercise per Week: 3 days    Minutes of Exercise per Session: 30 min  Stress: No Stress Concern Present (01/16/2023)   [employer] of Occupational Health - Occupational Stress Questionnaire    Feeling of Stress : Not at all  Social Connections: Moderately Integrated (01/19/2024)   Social Connection and Isolation Panel    Frequency of Communication with Friends and Family: More than three times a week    Frequency of Social Gatherings with Friends and Family: Three times a week    Attends Religious Services: 1  to 4 times per year    Active Member of Clubs or Organizations: No    Attends Banker Meetings: Never    Marital Status: Married  Catering manager Violence: Not At Risk (01/19/2024)   Humiliation, Afraid, Rape, and Kick questionnaire    Fear of Current or Ex-Partner: No    Emotionally Abused: No    Physically Abused: No    Sexually Abused: No     Review of Systems  All other systems reviewed and are negative.      Objective:   Physical Exam Vitals reviewed.  Constitutional:      General: She is not in acute distress.    Appearance: Normal appearance. She is well-developed and normal weight. She is not ill-appearing, toxic-appearing or diaphoretic.  HENT:     Head: Normocephalic and atraumatic.     Right Ear: Tympanic membrane and ear canal normal.     Left Ear: Tympanic membrane and ear canal normal.     Nose: Nose normal. No congestion or rhinorrhea.     Mouth/Throat:     Pharynx: Oropharynx is clear. No oropharyngeal exudate, posterior oropharyngeal erythema or uvula swelling.     Tonsils: No tonsillar exudate or tonsillar abscesses.  Eyes:     General: No scleral icterus.       Right eye: No discharge.        Left eye: No discharge.     Extraocular Movements: Extraocular movements intact.     Conjunctiva/sclera: Conjunctivae normal.     Pupils: Pupils are equal, round, and reactive to light.   Neck:     Vascular: No carotid bruit.  Cardiovascular:     Rate and Rhythm: Normal rate and regular rhythm.     Heart sounds: Normal heart sounds. No murmur heard.    No friction rub. No gallop.  Pulmonary:     Effort: Pulmonary effort is normal. No respiratory distress.     Breath sounds: Normal breath sounds. No stridor. No wheezing or rales.  Abdominal:     General: Bowel sounds are normal. There is no distension.     Palpations: Abdomen is soft. There is no mass.     Tenderness: There is no abdominal tenderness. There is no guarding or rebound.  Musculoskeletal:        General: Deformity present.     Cervical back: No rigidity.     Right knee: Swelling present. Decreased range of motion. Tenderness present over the medial joint line and lateral joint line.     Right lower leg: No edema.     Left lower leg: No edema.  Lymphadenopathy:     Cervical: No cervical adenopathy.  Skin:    Findings: No lesion or rash.  Neurological:     General: No focal deficit present.     Mental Status: She is alert and oriented to person, place, and time. Mental status is at baseline.     Cranial Nerves: No cranial nerve deficit.     Sensory: No sensory deficit.     Motor: No weakness.     Coordination: Coordination normal.     Gait: Gait normal.  Psychiatric:        Mood and Affect: Mood normal.        Behavior: Behavior normal.        Thought Content: Thought content normal.        Judgment: Judgment normal.           Assessment & Plan:  Atrial fibrillation, unspecified type (HCC) - Plan: Flu vaccine HIGH DOSE PF(Fluzone Trivalent)  Stage 3 chronic kidney disease, unspecified whether stage 3a or 3b CKD (HCC) - Plan: Flu vaccine HIGH DOSE PF(Fluzone Trivalent)  Pure hypercholesterolemia - Plan: Flu vaccine HIGH DOSE PF(Fluzone Trivalent)  Chronic systolic congestive heart failure (HCC) - Plan: Flu vaccine HIGH DOSE PF(Fluzone Trivalent)  NDPH (new daily persistent headache) -  Plan: MR Brain W Wo Contrast, CBC with Differential/Platelet, CULTURE, ROUTINE-ABSCESS, Sedimentation rate, Flu vaccine HIGH DOSE PF(Fluzone Trivalent)  Flu vaccine need - Plan: Flu vaccine HIGH DOSE PF(Fluzone Trivalent) Patient has been persistent with.  Location is concerning for temporal arteritis.  Seems atypical for trigeminal neuralgia.  Tension headaches with weird paresthesias would also be on the differential diagnosis.  Patient has no history of migraines to my knowledge.  Given the unusual symptoms I recommended an MRI of the brain to further evaluate.  I will check a sedimentation rate.  If the MRI is normal, and the sedimentation rate is elevated, consider temporal arteritis.  If the sedimentation rate is normal and the MRI is normal, treat the patient for possible neurogenic headache or chronic tension headache with gabapentin.  Blood pressure today is acceptable.  Check CBC and CMP as well

## 2024-05-22 LAB — CBC WITH DIFFERENTIAL/PLATELET
Absolute Lymphocytes: 2523 {cells}/uL (ref 850–3900)
Absolute Monocytes: 1088 {cells}/uL — ABNORMAL HIGH (ref 200–950)
Basophils Absolute: 81 {cells}/uL (ref 0–200)
Basophils Relative: 1.1 %
Eosinophils Absolute: 148 {cells}/uL (ref 15–500)
Eosinophils Relative: 2 %
HCT: 35 % (ref 35.0–45.0)
Hemoglobin: 11.1 g/dL — ABNORMAL LOW (ref 11.7–15.5)
MCH: 30.8 pg (ref 27.0–33.0)
MCHC: 31.7 g/dL — ABNORMAL LOW (ref 32.0–36.0)
MCV: 97.2 fL (ref 80.0–100.0)
MPV: 10.3 fL (ref 7.5–12.5)
Monocytes Relative: 14.7 %
Neutro Abs: 3559 {cells}/uL (ref 1500–7800)
Neutrophils Relative %: 48.1 %
Platelets: 398 Thousand/uL (ref 140–400)
RBC: 3.6 Million/uL — ABNORMAL LOW (ref 3.80–5.10)
RDW: 11.5 % (ref 11.0–15.0)
Total Lymphocyte: 34.1 %
WBC: 7.4 Thousand/uL (ref 3.8–10.8)

## 2024-05-22 LAB — SED RATE MANUAL WEST RFLX: SED RATE BY MODIFIED WESTERGREN,MANUAL: 6 mm/h (ref 0–30)

## 2024-05-22 LAB — SEDIMENTATION RATE

## 2024-05-24 ENCOUNTER — Ambulatory Visit: Payer: Self-pay | Admitting: Family Medicine

## 2024-05-25 ENCOUNTER — Telehealth: Payer: Self-pay

## 2024-05-25 NOTE — Telephone Encounter (Signed)
 Copied from CRM #[id number]. Topic: General - Call Back - No Documentation >> May 25, 2024  9:56 AM Rea ORN wrote: Reason for CRM: Pt returned CMA for lab results. Lab results given.

## 2024-05-26 ENCOUNTER — Ambulatory Visit: Admitting: Podiatry

## 2024-06-01 ENCOUNTER — Ambulatory Visit: Admitting: Podiatry

## 2024-06-02 ENCOUNTER — Ambulatory Visit (HOSPITAL_COMMUNITY)
Admission: RE | Admit: 2024-06-02 | Discharge: 2024-06-02 | Disposition: A | Discharge status: Home / Self Care | Source: Ambulatory Visit | Attending: Family Medicine | Admitting: Family Medicine

## 2024-06-02 DIAGNOSIS — G4452 New daily persistent headache (NDPH): Secondary | ICD-10-CM | POA: Diagnosis not present

## 2024-06-02 DIAGNOSIS — R519 Headache, unspecified: Secondary | ICD-10-CM | POA: Diagnosis not present

## 2024-06-02 MED ORDER — GADOBUTROL 1 MMOL/ML IV SOLN
7.0000 mL | Freq: Once | INTRAVENOUS | Status: AC | PRN
Start: 2024-06-02 — End: 2024-06-02
  Administered 2024-06-02: 7 mL via INTRAVENOUS

## 2024-06-04 ENCOUNTER — Encounter: Payer: Self-pay | Admitting: Podiatry

## 2024-06-04 ENCOUNTER — Ambulatory Visit: Admitting: Podiatry

## 2024-06-04 ENCOUNTER — Other Ambulatory Visit: Payer: Self-pay

## 2024-06-04 DIAGNOSIS — M79672 Pain in left foot: Secondary | ICD-10-CM | POA: Diagnosis not present

## 2024-06-04 DIAGNOSIS — B351 Tinea unguium: Secondary | ICD-10-CM | POA: Diagnosis not present

## 2024-06-04 DIAGNOSIS — M79671 Pain in right foot: Secondary | ICD-10-CM

## 2024-06-04 DIAGNOSIS — G44209 Tension-type headache, unspecified, not intractable: Secondary | ICD-10-CM

## 2024-06-04 MED ORDER — GABAPENTIN 100 MG PO CAPS: 100.0000 mg | ORAL_CAPSULE | Freq: Three times a day (TID) | ORAL | 0 refills | Status: DC

## 2024-06-04 NOTE — Progress Notes (Signed)
 Patient presents for evaluation and treatment of tenderness and some redness around nails feet.  Tenderness around toes with walking and wearing shoes.  Physical exam:  General appearance: Alert, pleasant, and in no acute distress.  Vascular: Pedal pulses: DP 1/4 B/L, PT 0/4 B/L. Mild edema lower legs bilaterally  Neu  Dermatologic:  Nails thickened, disfigured, discolored 1-5 BL with subungual debris.  Redness and hypertrophic nail folds along nail folds bilaterally but no signs of drainage or infection.  Musculoskeletal:     Diagnosis: 1. Painful onychomycotic nails 1 through 5 bilaterally. 2. Pain toes 1 through 5 bilaterally.  Plan: -Debrided onychomycotic nails 1 through 5 bilaterally.  Sharply debrided nails with nail clipper and reduced with a power bur.  Return 3 months RFC

## 2024-06-18 ENCOUNTER — Ambulatory Visit (INDEPENDENT_AMBULATORY_CARE_PROVIDER_SITE_OTHER): Admitting: Orthopaedic Surgery

## 2024-06-18 ENCOUNTER — Other Ambulatory Visit

## 2024-06-18 ENCOUNTER — Other Ambulatory Visit (INDEPENDENT_AMBULATORY_CARE_PROVIDER_SITE_OTHER)

## 2024-06-18 DIAGNOSIS — M79641 Pain in right hand: Secondary | ICD-10-CM | POA: Diagnosis not present

## 2024-06-18 DIAGNOSIS — G8929 Other chronic pain: Secondary | ICD-10-CM

## 2024-06-18 DIAGNOSIS — M25561 Pain in right knee: Secondary | ICD-10-CM

## 2024-06-18 MED ORDER — HYDROCODONE-ACETAMINOPHEN 5-325 MG PO TABS: 1.0000 | ORAL_TABLET | Freq: Every day | ORAL | 0 refills | Status: DC | PRN

## 2024-06-18 NOTE — Progress Notes (Signed)
 Office Visit Note   Patient: Erin Wong           Date of Birth: [birth year]           MRN: [id number] Visit Date: 06/18/2024              Requested by: Duanne Butler ONEIDA, MD [facility] [address] [place],  [place] [address] PCP: Duanne Butler ONEIDA, MD   Assessment & Plan: Visit Diagnoses:  1. Pain in right hand   2. Chronic pain of right knee     Plan: History of Present Illness Erin Wong is a 73 year old female who presents with right knee and right hand injuries following a fall.  She tripped over a rug and fell yesterday, injuring her right knee and hand. Her right knee was asymptomatic prior to the fall. She is on Eliquis .  She manages the swelling by keeping her hand elevated above heart level and applying a bag of frozen peas.  Physical Exam MUSCULOSKELETAL: Right knee shows fully healed surgical scar.  She has excellent range of motion that is basically pain-free other than some soft tissue tenderness.  There is ecchymosis and a very small abrasion across the anterior aspect of the knee.  Right hand shows moderate swelling and ecchymosis without any neurovascular compromise.  She has no motor or sensory deficits. NEUROLOGICAL: Sensation intact in fingertips.  Assessment and Plan Right hand contusion and swelling Diffuse swelling and ecchymosis due to soft tissue damage. No bony injury.  Complicated by Eliquis . - Advise elevation of the hand above heart level. - Recommend cold compress application.  Right knee contusion and abrasion Contusion and abrasion with no damage to knee replacement. Swelling and bruising exacerbated by anticoagulation. - Advise on prolonged symptoms due to anticoagulation. - Continue symptomatic treatment. - Norco prescribed for breakthrough pain.  Follow-Up Instructions: No follow-ups on file.   Orders:  Orders Placed This Encounter  Procedures   XR Hand Complete Right   XR KNEE 3 VIEW RIGHT   Meds ordered this encounter   Medications   HYDROcodone -acetaminophen  (NORCO/VICODIN) 5-325 MG tablet    Sig: Take 1-2 tablets by mouth daily as needed for moderate pain (pain score 4-6).    Dispense:  10 tablet    Refill:  0      Procedures: No procedures performed   Clinical Data: No additional findings.   Subjective: Chief Complaint  Patient presents with   Right Hand - Pain   Right Knee - Pain    HPI  Review of Systems  Constitutional: Negative.   HENT: Negative.    Eyes: Negative.   Respiratory: Negative.    Cardiovascular: Negative.   Endocrine: Negative.   Musculoskeletal: Negative.   Neurological: Negative.   Hematological: Negative.   Psychiatric/Behavioral: Negative.    All other systems reviewed and are negative.    Objective: Vital Signs: There were no vitals taken for this visit.  Physical Exam Vitals and nursing note reviewed.  Constitutional:      Appearance: She is well-developed.  HENT:     Head: Atraumatic.     Nose: Nose normal.  Eyes:     Extraocular Movements: Extraocular movements intact.  Cardiovascular:     Pulses: Normal pulses.  Pulmonary:     Effort: Pulmonary effort is normal.  Abdominal:     Palpations: Abdomen is soft.  Musculoskeletal:     Cervical back: Neck supple.  Skin:    General: Skin is warm.  Capillary Refill: Capillary refill takes less than 2 seconds.  Neurological:     Mental Status: She is alert. Mental status is at baseline.  Psychiatric:        Behavior: Behavior normal.        Thought Content: Thought content normal.        Judgment: Judgment normal.     Ortho Exam  Specialty Comments:  No specialty comments available.  Imaging: XR Hand Complete Right Result Date: 06/18/2024 Examination of the right hand shows diffuse degenerative changes.  Prior compression screw of the index finger for DIP fusion.  No acute abnormalities.  XR KNEE 3 VIEW RIGHT Result Date: 06/18/2024 X-rays of the right knee show a right total  knee arthroplasty without any complications.  No acute abnormalities.    PMFS History: Patient Active Problem List   Diagnosis Date Noted   Personal history of colon polyps, unspecified 12/16/2023   Chronic anticoagulation 12/16/2023   Pulmonary nodule 1 cm or greater in diameter 12/11/2023   At risk for amiodarone  toxicity with long term use 12/11/2023   Tinnitus, right ear 03/21/2023   Status post total right knee replacement 02/19/2021   Post-traumatic osteoarthritis of right knee 12/04/2020   Positive colorectal cancer screening using Cologuard test 07/02/2020   History of colectomy 07/02/2020   Colon cancer screening 07/02/2020   CHF (congestive heart failure) (HCC)    Acute respiratory failure with hypoxia (HCC) 02/12/2020   Acute bronchitis 02/12/2020   Acute systolic heart failure (HCC) 01/19/2020   Atrial fibrillation with rapid ventricular response (HCC) 01/13/2020   CKD (chronic kidney disease) stage 3, GFR 30-59 ml/min (HCC)    Benign essential HTN    HLD (hyperlipidemia)    Hyperlipidemia 02/12/2017   AKI (acute kidney injury)    Chest pain 01/13/2017   Chest pressure    Acute kidney injury superimposed on CKD    Osteoporosis 10/18/2015   Vitamin D  deficiency 07/20/2015   HTN (hypertension) 07/17/2015   GERD (gastroesophageal reflux disease) 01/12/2015   Chronic constipation 01/12/2015   History of colon surgery 01/12/2015   History of total hysterectomy with bilateral salpingo-oophorectomy (BSO) 01/12/2015   Allergy    Asthma    Past Medical History:  Diagnosis Date   Allergy    Arthritis    Asthma    Atrial fibrillation (HCC)    Benign essential HTN    CHF (congestive heart failure) (HCC)    CKD (chronic kidney disease) stage 3, GFR 30-59 ml/min (HCC)    Diverticulosis    Dysrhythmia     Afib -  had Ablation   GERD (gastroesophageal reflux disease)    HLD (hyperlipidemia)    HTN (hypertension)    Pneumonia     Family History  Problem Relation  Age of Onset   Arthritis Mother    Miscarriages / [place] Mother    Lung cancer Mother        Had quit smoking for 25 years   Esophageal cancer Mother    Alcohol abuse Father    Breast cancer Sister    Alcohol abuse Brother    Kidney cancer Brother    COPD Brother    Asthma Maternal Aunt    Diabetes Maternal Aunt    Colon cancer Neg Hx    Inflammatory bowel disease Neg Hx    Liver disease Neg Hx    Pancreatic cancer Neg Hx    Rectal cancer Neg Hx    Stomach cancer Neg Hx  Colon polyps Neg Hx     Past Surgical History:  Procedure Laterality Date   A-FLUTTER ABLATION N/A 04/13/2024   Procedure: A-FLUTTER ABLATION;  Surgeon: Mealor, Eulas BRAVO, MD;  Location: [facility];  Service: Cardiovascular;  Laterality: N/A;   ABDOMINAL HYSTERECTOMY  10/04/1995   Hysterectomy and Bilateral oophorectomy   ATRIAL FIBRILLATION ABLATION N/A 04/13/2024   Procedure: ATRIAL FIBRILLATION ABLATION;  Surgeon: Nancey Eulas BRAVO, MD;  Location: [facility];  Service: Cardiovascular;  Laterality: N/A;   CARDIOVERSION N/A 01/17/2020   Procedure: CARDIOVERSION;  Surgeon: Delford Maude BROCKS, MD;  Location: [facility] ENDOSCOPY;  Service: Cardiovascular;  Laterality: N/A;   COLON SURGERY  09/2009   hole in colon   FRACTURE SURGERY Right 08/02/2011   Right Leg--Plates & Screws   HAND SURGERY Right 09/02/2006   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--Arthritis   HARDWARE REMOVAL Right 12/04/2020   Procedure: rigth knee hardware removal;  Surgeon: Jerri Kay HERO, MD;  Location: [facility] OR;  Service: Orthopedics;  Laterality: Right;   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   at time of colon surgery   TEE WITHOUT CARDIOVERSION N/A 01/17/2020   Procedure: TRANSESOPHAGEAL ECHOCARDIOGRAM (TEE);  Surgeon: Delford Maude BROCKS, MD;  Location: [facility] ENDOSCOPY;  Service: Cardiovascular;  Laterality: N/A;   TOTAL KNEE ARTHROPLASTY Right 02/19/2021   Procedure: RIGHT TOTAL KNEE ARTHROPLASTY;  Surgeon: Jerri Kay HERO, MD;  Location: [facility] OR;  Service:  Orthopedics;  Laterality: Right;   TRANSESOPHAGEAL ECHOCARDIOGRAM (CATH LAB) N/A 04/13/2024   Procedure: TRANSESOPHAGEAL ECHOCARDIOGRAM;  Surgeon: Nancey Eulas BRAVO, MD;  Location: [facility];  Service: Cardiovascular;  Laterality: N/A;   Social History   Occupational History   Occupation: retired  Tobacco Use   Smoking status: Former    Current packs/day: 0.00    Types: Cigarettes    Quit date: 01/10/1991    Years since quitting: 33.4   Smokeless tobacco: Never   Tobacco comments:    Former smoker 05/10/24  Vaping Use   Vaping status: Never Used  Substance and Sexual Activity   Alcohol use: Not Currently    Comment: quit in May/2021   Drug use: No   Sexual activity: Never

## 2024-07-08 ENCOUNTER — Other Ambulatory Visit: Payer: Self-pay | Admitting: Family Medicine

## 2024-07-12 NOTE — Progress Notes (Unsigned)
  Electrophysiology Office Note:   Date:  07/13/2024  ID:  Erin Wong, DOB [birth year], MRN [id number]  Primary Cardiologist: Maude Emmer, MD Electrophysiologist: Eulas FORBES Furbish, MD   Electrophysiologist:  Eulas FORBES Furbish, MD      History of Present Illness:   Erin Wong is a 73 y.o. female with h/o HFrecEF, HTN, HLD, CKD, CAD, atrial fibrillation s/p ablation seen today for routine electrophysiology follow-up s/p Ablation.  Patient underwent afib/flutter ablation with Dr. Furbish on 04/13/24. Ablation of pulmonary veins with PFA, also had ablation of CTI for typical flutter. One month post-procedure patient doing well when seen in afib clinic.   Since last being seen in our clinic the patient reports doing okay. She has recently begun noticing brief periods of palpitations (duration in minutes). She says this feels like her afib, though now post ablation, symptoms do not persist like before. While these palpitations are occurring, she feels weak.  she otherwise denies chest pain, palpitations, dyspnea, PND, orthopnea, nausea, vomiting, dizziness, syncope, edema, weight gain, or early satiety.    Review of systems complete and found to be negative unless listed in HPI.   EP Information / Studies Reviewed:    EKG is ordered today. Personal review as below.  EKG Interpretation Date/Time:  Tuesday July 13 2024 11:55:03 EST Ventricular Rate:  57 PR Interval:  198 QRS Duration:  104 QT Interval:  440 QTC Calculation: 428 R Axis:   1  Text Interpretation: Sinus bradycardia with Premature supraventricular complexes Low voltage QRS Confirmed by Trudy Birmingham [phone]) on 07/13/2024 11:58:37 AM    Arrhythmia/Device History No specialty comments available.   Physical Exam:   VS:  BP 128/80   Pulse (!) 57   Ht 5' 3 (1.6 m)   Wt 158 lb (71.7 kg)   SpO2 97%   BMI 27.99 kg/m    Wt Readings from Last 3 Encounters:  07/13/24 158 lb (71.7 kg)  05/21/24 147 lb 3.2 oz (66.8 kg)   05/10/24 159 lb (72.1 kg)     GEN: No acute distress NECK: No JVD; No carotid bruits CARDIAC: Regular rate and rhythm, no murmurs, rubs, gallops RESPIRATORY:  Clear to auscultation without rales, wheezing or rhonchi  ABDOMEN: Soft, non-tender, non-distended EXTREMITIES:  No edema; No deformity   ASSESSMENT AND PLAN:    Persistent atrial fibrillation/flutter Secondary hypercoagulable state Patient 90 days s/p afib/flutter ablation, now off Amiodarone . Now reporting symptoms that are suggestive of brief bursts of afib. Despite this, overall feels well. Will assess arrhythmia burden with 7 day Zio. Continue Eliquis  5mg  BID and Coreg  3.125mg  BID.   HFrecEF Patient euvolemic today. No CHF symptoms reported.      Follow up with EP Team 2 months.   Signed, [place] Trudy, PA-C

## 2024-07-13 ENCOUNTER — Ambulatory Visit: Attending: Student | Admitting: Cardiology

## 2024-07-13 ENCOUNTER — Encounter: Payer: Self-pay | Admitting: Cardiology

## 2024-07-13 ENCOUNTER — Ambulatory Visit: Attending: Cardiology

## 2024-07-13 VITALS — BP 128/80 | HR 57 | Ht 63.0 in | Wt 158.0 lb

## 2024-07-13 DIAGNOSIS — R002 Palpitations: Secondary | ICD-10-CM

## 2024-07-13 DIAGNOSIS — I480 Paroxysmal atrial fibrillation: Secondary | ICD-10-CM | POA: Diagnosis not present

## 2024-07-13 NOTE — Patient Instructions (Signed)
 Medication Instructions:  Your physician recommends that you continue on your current medications as directed. Please refer to the Current Medication list given to you today.  *If you need a refill on your cardiac medications before your next appointment, please call your pharmacy*  Lab Work: None ordered If you have labs (blood work) drawn today and your tests are completely normal, you will receive your results only by: MyChart Message (if you have MyChart) OR A paper copy in the mail If you have any lab test that is abnormal or we need to change your treatment, we will call you to review the results.  Testing/Procedures: Erin Wong- Long Term Monitor Instructions  Your physician has requested you wear a ZIO patch monitor for 7 days.  This is a single patch monitor. [employer] supplies one patch monitor per enrollment. Additional stickers are not available. Please do not apply patch if you will be having a Nuclear Stress Test,  Echocardiogram, Cardiac CT, MRI, or Chest Xray during the period you would be wearing the  monitor. The patch cannot be worn during these tests. You cannot remove and re-apply the  ZIO XT patch monitor.  Your ZIO patch monitor will be mailed 3 day USPS to your address on file. It may take 3-5 days  to receive your monitor after you have been enrolled.  Once you have received your monitor, please review the enclosed instructions. Your monitor  has already been registered assigning a specific monitor serial # to you.  Billing and Patient Assistance Program Information  We have supplied [employer] with any of your insurance information on file for billing purposes. [employer] offers a sliding scale Patient Assistance Program for patients that do not have  insurance, or whose insurance does not completely cover the cost of the ZIO monitor.  You must apply for the Patient Assistance Program to qualify for this discounted rate.  To apply, please call [employer] at [phone],  select option 4, select option 2, ask to apply for  Patient Assistance Program. Meredeth will ask your household income, and how many people  are in your household. They will quote your out-of-pocket cost based on that information.  [employer] will also be able to set up a 59-month, interest-free payment plan if needed.  Applying the monitor   Shave hair from upper left chest.  Hold abrader disc by orange tab. Rub abrader in 40 strokes over the upper left chest as  indicated in your monitor instructions.  Clean area with 4 enclosed alcohol pads. Let dry.  Apply patch as indicated in monitor instructions. Patch will be placed under collarbone on left  side of chest with arrow pointing upward.  Rub patch adhesive wings for 2 minutes. Remove white label marked 1. Remove the white  label marked 2. Rub patch adhesive wings for 2 additional minutes.  While looking in a mirror, press and release button in center of patch. A small green light will  flash 3-4 times. This will be your only indicator that the monitor has been turned on.  Do not shower for the first 24 hours. You may shower after the first 24 hours.  Press the button if you feel a symptom. You will hear a small click. Record Date, Time and  Symptom in the Patient Logbook.  When you are ready to remove the patch, follow instructions on the last 2 pages of Patient  Logbook. Stick patch monitor onto the last page of Patient Logbook.  Place Patient Logbook in the  blue and white box. Use locking tab on box and tape box closed  securely. The blue and white box has prepaid postage on it. Please place it in the mailbox as  soon as possible. Your physician should have your test results approximately 7 days after the  monitor has been mailed back to [facility].  Call [facility] Customer Care at [phone] if you have questions regarding  your ZIO XT patch monitor. Call them immediately if you see an orange light blinking on your   monitor.  If your monitor falls off in less than 4 days, contact our Monitor department at [phone].  If your monitor becomes loose or falls off after 4 days call [employer] at [phone] for  suggestions on securing your monitor   Follow-Up: At [facility], you and your health needs are our priority.  As part of our continuing mission to provide you with exceptional heart care, our providers are all part of one team.  This team includes your primary Cardiologist (physician) and Advanced Practice Providers or APPs (Physician Assistants and Nurse Practitioners) who all work together to provide you with the care you need, when you need it.  Your next appointment:   2 month(s)  Provider:   You may see Eulas FORBES Furbish, MD or one of the following Advanced Practice Providers on your designated Care Team:   Charlies Arthur, [place] Ozell Jodie Passey, PA-C Suzann Riddle, NP Daphne Barrack, NP Artist Pouch, PA-C   We recommend signing up for the patient portal called MyChart.  Sign up information is provided on this After Visit Summary.  MyChart is used to connect with patients for Virtual Visits (Telemedicine).  Patients are able to view lab/test results, encounter notes, upcoming appointments, etc.  Non-urgent messages can be sent to your provider as well.   To learn more about what you can do with MyChart, go to [email or web address].

## 2024-07-13 NOTE — Progress Notes (Unsigned)
Enrolled patient for a 7 day Zio XT monitor to be mailed to patients home  Erin Wong to read

## 2024-08-02 DIAGNOSIS — R002 Palpitations: Secondary | ICD-10-CM

## 2024-08-03 ENCOUNTER — Ambulatory Visit: Payer: Self-pay | Admitting: Cardiology

## 2024-08-12 ENCOUNTER — Ambulatory Visit: Admitting: Emergency Medicine

## 2024-08-14 ENCOUNTER — Other Ambulatory Visit: Payer: Self-pay | Admitting: Family Medicine

## 2024-08-16 ENCOUNTER — Ambulatory Visit
Admission: RE | Admit: 2024-08-16 | Discharge: 2024-08-16 | Disposition: A | Source: Ambulatory Visit | Attending: Emergency Medicine | Admitting: Emergency Medicine

## 2024-08-16 DIAGNOSIS — R911 Solitary pulmonary nodule: Secondary | ICD-10-CM

## 2024-08-18 ENCOUNTER — Ambulatory Visit: Admitting: Emergency Medicine

## 2024-08-18 ENCOUNTER — Encounter: Payer: Self-pay | Admitting: Emergency Medicine

## 2024-08-18 VITALS — BP 126/74 | HR 103 | Temp 97.6°F | Ht 63.0 in | Wt 158.2 lb

## 2024-08-18 DIAGNOSIS — J4520 Mild intermittent asthma, uncomplicated: Secondary | ICD-10-CM

## 2024-08-18 DIAGNOSIS — R911 Solitary pulmonary nodule: Secondary | ICD-10-CM

## 2024-08-18 NOTE — Assessment & Plan Note (Signed)
 Please continue your Wixela twice daily as you have been taking it.  Rinse and gargle after using. Keep your albuterol  available to use 2 puffs if needed for shortness of breath, chest tightness, wheezing. Flu shot and RSV shot are both up-to-date

## 2024-08-18 NOTE — Patient Instructions (Addendum)
 We reviewed your CT scan of the chest today.  This is stable compared with priors.  Good news. We will repeat your CT chest in June 2026. Please continue your Wixela twice daily as you have been taking it.  Rinse and gargle after using. Keep your albuterol  available to use 2 puffs if needed for shortness of breath, chest tightness, wheezing. Flu shot and RSV shot are both up-to-date Follow Dr. Shelah in June.  If you develop any new respiratory symptoms please call so we can see you sooner.

## 2024-08-18 NOTE — Progress Notes (Signed)
 Subjective:    Patient ID: Erin Wong, female    DOB: July 08, 1951, 73 y.o.   MRN: [id number]  HPI   ROV 02/11/2024 --73 year old woman with A-fib, CKD stage III, former tobacco use, asthma.  She has been managed on Wixela and albuterol  as needed.  She is managed by Dr. Nishan for atrial fibrillation and is on amiodarone .  Last pulmonary function testing 01/22/2021.  I saw her in April for an abnormal CT scan of the chest.  She had a cardiac calcium  scoring CT February 2025 that showed a 1 cm right lower lobe nodule that was not present on scan from 2021. Overall she is doing well, no dyspnea. Rare albuterol  use. No real cough or wheeze.   ROV 08/18/2024 --Erin Wong is 73 and is followed for asthma, COPD, new right lower lobe pulmonary nodule identified 10/2023 and some tree-in-bud nodularity on CT scan of the chest.  Also with a history of atrial fibrillation on amiodarone . Today she reports that her A Fib has been bothering her - she feels palpitations. She just completed an event monitor and is seeing Cards soon. She feels that her breathing has been ok. She is on Wixela, rarely needs albuterol . Needs refill.  Minimal cough. No flares, abx or pred. She does have some occasional aspiration sx. Flu shot up to date, RSV up to date.   CT scan of the chest 08/16/2024 reviewed by me shows that her right lower lobe pulmonary nodule is stable in size and appearance.  The final radiology read is still pending.    Review of Systems As per HPI  Past Medical History:  Diagnosis Date   Allergy    Arthritis    Asthma    Atrial fibrillation (HCC)    Benign essential HTN    CHF (congestive heart failure) (HCC)    CKD (chronic kidney disease) stage 3, GFR 30-59 ml/min (HCC)    Diverticulosis    Dysrhythmia     Afib -  had Ablation   GERD (gastroesophageal reflux disease)    HLD (hyperlipidemia)    HTN (hypertension)    Pneumonia      Family History  Problem Relation Age of Onset   Arthritis  Mother    Miscarriages / Stillbirths Mother    Lung cancer Mother        Had quit smoking for 25 years   Esophageal cancer Mother    Alcohol abuse Father    Breast cancer Sister    Alcohol abuse Brother    Kidney cancer Brother    COPD Brother    Asthma Maternal Aunt    Diabetes Maternal Aunt    Colon cancer Neg Hx    Inflammatory bowel disease Neg Hx    Liver disease Neg Hx    Pancreatic cancer Neg Hx    Rectal cancer Neg Hx    Stomach cancer Neg Hx    Colon polyps Neg Hx      Social History   Socioeconomic History   Marital status: Married    Spouse name: Not on file   Number of children: 3   Years of education: Not on file   Highest education level: Not on file  Occupational History   Occupation: retired  Tobacco Use   Smoking status: Former    Current packs/day: 0.00    Types: Cigarettes    Quit date: 01/10/1991    Years since quitting: 33.6   Smokeless tobacco: Never   Tobacco comments:  Former smoker 05/10/24  Vaping Use   Vaping status: Never Used  Substance and Sexual Activity   Alcohol use: Not Currently    Comment: quit in May/2021   Drug use: No   Sexual activity: Never  Other Topics Concern   Not on file  Social History Narrative   Not on file   Social Drivers of Health   Tobacco Use: Medium Risk (08/18/2024)   Patient History    Smoking Tobacco Use: Former    Smokeless Tobacco Use: Never    Passive Exposure: Not on file  Financial Resource Strain: Low Risk (01/16/2023)   Overall Financial Resource Strain (CARDIA)    Difficulty of Paying Living Expenses: Not hard at all  Food Insecurity: No Food Insecurity (01/19/2024)   Hunger Vital Sign    Worried About Running Out of Food in the Last Year: Never true    Ran Out of Food in the Last Year: Never true  Transportation Needs: No Transportation Needs (01/19/2024)   PRAPARE - Administrator, Civil Service (Medical): No    Lack of Transportation (Non-Medical): No  Physical Activity:  Insufficiently Active (01/16/2023)   Exercise Vital Sign    Days of Exercise per Week: 3 days    Minutes of Exercise per Session: 30 min  Stress: No Stress Concern Present (01/16/2023)   [employer] of Occupational Health - Occupational Stress Questionnaire    Feeling of Stress : Not at all  Social Connections: Moderately Integrated (01/19/2024)   Social Connection and Isolation Panel    Frequency of Communication with Friends and Family: More than three times a week    Frequency of Social Gatherings with Friends and Family: Three times a week    Attends Religious Services: 1 to 4 times per year    Active Member of Clubs or Organizations: No    Attends Banker Meetings: Never    Marital Status: Married  Catering Manager Violence: Not At Risk (01/19/2024)   Humiliation, Afraid, Rape, and Kick questionnaire    Fear of Current or Ex-Partner: No    Emotionally Abused: No    Physically Abused: No    Sexually Abused: No  Depression (PHQ2-9): Low Risk (05/21/2024)   Depression (PHQ2-9)    PHQ-2 Score: 0  Alcohol Screen: Low Risk (01/16/2023)   Alcohol Screen    Last Alcohol Screening Score (AUDIT): 0  Housing: Low Risk (01/19/2024)   Housing Stability Vital Sign    Unable to Pay for Housing in the Last Year: No    Number of Times Moved in the Last Year: 0    Homeless in the Last Year: No  Utilities: Not At Risk (01/19/2024)   AHC Utilities    Threatened with loss of utilities: No  Health Literacy: Not on file    - Former smoker, smoked a pack a day for 35 years  No Known Allergies   Outpatient Medications Prior to Visit  Medication Sig Dispense Refill   albuterol  (VENTOLIN  HFA) 108 (90 Base) MCG/ACT inhaler Inhale 1 puff into the lungs every 6 (six) hours as needed for wheezing or shortness of breath. 18 g 3   apixaban  (ELIQUIS ) 5 MG TABS tablet Take 1 tablet (5 mg total) by mouth 2 (two) times daily. 180 tablet 3   atorvastatin  (LIPITOR) 40 MG tablet Take 1 tablet  (40 mg total) by mouth daily. 90 tablet 3   carvedilol  (COREG ) 3.125 MG tablet Take 1 tablet (3.125 mg total) by mouth 2 (  two) times daily. 180 tablet 3   Cholecalciferol  25 MCG (1000 UT) tablet Take 1 tablet (1,000 Units total) by mouth daily. 90 tablet 1   Cyanocobalamin (VITAMIN B-12 PO) Take 1 tablet by mouth daily.     fluticasone  (FLONASE ) 50 MCG/ACT nasal spray Place 2 sprays into both nostrils daily as needed for allergies. 16 g 3   fluticasone -salmeterol (WIXELA INHUB) 100-50 MCG/ACT AEPB Inhale 1 puff into the lungs 2 (two) times daily. 180 each 2   furosemide  (LASIX ) 40 MG tablet Take 1 tablet (40 mg total) by mouth daily. 90 tablet 3   gabapentin  (NEURONTIN ) 100 MG capsule Take 1 capsule (100 mg total) by mouth 3 (three) times daily. 90 capsule 0   HYDROcodone -acetaminophen  (NORCO/VICODIN) 5-325 MG tablet Take 1-2 tablets by mouth daily as needed for moderate pain (pain score 4-6). 10 tablet 0   Misc Natural Products (NEURIVA PO) Take 1 tablet by mouth daily.     polyethylene glycol (MIRALAX  / GLYCOLAX ) 17 g packet Take 17 g by mouth daily.     traMADol  (ULTRAM ) 50 MG tablet TAKE 2 TABLETS (100 MG TOTAL) BY MOUTH 4 (FOUR) TIMES DAILY. NEEDS TO BE SEEN BEFORE MORE REFILLS 240 tablet 0   Biotin 5000 MCG TABS Take 5,000 mcg by mouth every evening. (Patient not taking: Reported on 08/18/2024)     No facility-administered medications prior to visit.         Objective:   Physical Exam Vitals:   08/18/24 1125  BP: 126/74  Pulse: (!) 103  Temp: 97.6 F (36.4 C)  TempSrc: Temporal  SpO2: 95%  Weight: 158 lb 3.2 oz (71.8 kg)  Height: 5' 3 (1.6 m)   Gen: Pleasant, well-nourished, in no distress,  normal affect  ENT: No lesions,  mouth clear,  oropharynx clear, no postnasal drip  Neck: No JVD, no stridor  Lungs: No use of accessory muscles, no crackles or wheezing on normal respiration, no wheeze on forced expiration  Cardiovascular: RRR, heart sounds normal, no murmur or  gallops, no peripheral edema  Musculoskeletal: No deformities, no cyanosis or clubbing  Neuro: alert, awake, non focal  Skin: Warm, no lesions or rash       Assessment & Plan:   Pulmonary nodule 1 cm or greater in diameter I reviewed her CT scan of the chest from 12/15.  I do not see any interval change in the right lower lobe pulmonary nodule.  The final read is still pending.  Will plan to repeat her CT at 66-month mark which would be June  Asthma Please continue your Wixela twice daily as you have been taking it.  Rinse and gargle after using. Keep your albuterol  available to use 2 puffs if needed for shortness of breath, chest tightness, wheezing. Flu shot and RSV shot are both up-to-date  I personally spent a total of 40 minutes in the care of the patient today including preparing to see the patient, getting/reviewing separately obtained history, performing a medically appropriate exam/evaluation, counseling and educating, referring and communicating with other health care professionals, documenting clinical information in the EHR, independently interpreting results, and communicating results.    Lamar Chris, MD, PhD 08/18/2024, 4:43 PM [facility] [phone] or if no answer before 7:00PM call [phone] For any issues after 7:00PM please call eLink [phone]

## 2024-08-18 NOTE — Assessment & Plan Note (Signed)
 I reviewed her CT scan of the chest from 12/15.  I do not see any interval change in the right lower lobe pulmonary nodule.  The final read is still pending.  Will plan to repeat her CT at 35-month mark which would be June

## 2024-08-30 ENCOUNTER — Ambulatory Visit: Payer: Self-pay

## 2024-08-30 ENCOUNTER — Ambulatory Visit (HOSPITAL_COMMUNITY): Admitting: Physician Assistant

## 2024-08-30 NOTE — Telephone Encounter (Signed)
 FYI Only or Action Required?: Action required by provider: clinical question for provider and update on patient condition.  Patient was last seen in primary care on 05/21/2024 by Duanne Butler DASEN, MD.  Called Nurse Triage reporting Cough.  Symptoms began several days ago.  Interventions attempted: Prescription medications: inhalers not using albuterol  more than normal.  Symptoms are: unchanged.  Triage Disposition: See HCP Within 4 Hours (Or PCP Triage)  Patient/caregiver understands and will follow disposition?: No, wishes to speak with PCP

## 2024-08-30 NOTE — Telephone Encounter (Signed)
" ° °  Reason for Disposition  [1] MILD difficulty breathing (e.g., minimal/no SOB at rest, SOB with walking, pulse < 100) AND [2] still present when not coughing  Answer Assessment - Initial Assessment Questions Pt states when she is like this her and Dr. Duanne don't like her to come to the office. She states she is having a bad cough, not productive yet. She states she as runny nose, earache, temp 99.7. She states she is having shortness of breath all the time, when asked if more than normal she states no. She states she has been coughing so hard her side hurts. No using albuterol  more than normal. She is requesting an antibiotic as she does not want to come into the office.     1. ONSET: When did the cough begin?      Worsened over the weekend 2. SEVERITY: How bad is the cough today?      States bad 3. SPUTUM: Describe the color of your sputum (e.g., none, dry cough; clear, white, yellow, green)     denies 4. HEMOPTYSIS: Are you coughing up any blood? If Yes, ask: How much? (e.g., flecks, streaks, tablespoons, etc.)     denies 5. DIFFICULTY BREATHING: Are you having difficulty breathing? If Yes, ask: How bad is it? (e.g., mild, moderate, severe)      Yes but states no more than normal 6. FEVER: Do you have a fever? If Yes, ask: What is your temperature, how was it measured, and when did it start?     denies 7. CARDIAC HISTORY: Do you have any history of heart disease? (e.g., heart attack, congestive heart failure)       8. LUNG HISTORY: Do you have any history of lung disease?  (e.g., pulmonary embolus, asthma, emphysema)     asthma      10. OTHER SYMPTOMS: Do you have any other symptoms? (e.g., runny nose, wheezing, chest pain)       States side hurts from coughing so hard  Protocols used: Cough - Acute Non-Productive-A-AH  "

## 2024-08-31 ENCOUNTER — Other Ambulatory Visit: Payer: Self-pay | Admitting: Family Medicine

## 2024-08-31 MED ORDER — ALBUTEROL SULFATE HFA 108 (90 BASE) MCG/ACT IN AERS: 1.0000 | INHALATION_SPRAY | Freq: Four times a day (QID) | RESPIRATORY_TRACT | 3 refills | Status: AC | PRN

## 2024-09-01 NOTE — Telephone Encounter (Signed)
 Tried to call pt. No vm box to leave message.

## 2024-09-06 NOTE — Telephone Encounter (Signed)
 Unable to reach pt

## 2024-09-07 ENCOUNTER — Ambulatory Visit: Admitting: Physician Assistant

## 2024-09-08 ENCOUNTER — Ambulatory Visit: Admitting: Podiatry

## 2024-09-09 ENCOUNTER — Ambulatory Visit (HOSPITAL_COMMUNITY): Admitting: Physician Assistant

## 2024-09-10 ENCOUNTER — Telehealth: Payer: Self-pay | Admitting: Family Medicine

## 2024-09-10 NOTE — Telephone Encounter (Unsigned)
 Copied from CRM [id number]. Topic: Clinical - Refused Triage >> Sep 10, 2024 11:42 AM Willma R wrote: From previous Reason for Contact - Refused Triage: Patients daughter [facility] calling to advise patient has a terrible cough, weak, was runny a fever, no appetite and was unable to smell or taste. Declined speaking to NT is requesting that a nurse call her mother to discuss what she should do.  Patient can be reached at [phone]

## 2024-09-10 NOTE — Telephone Encounter (Signed)
 Pt informed that given her sx, she needs to go to the urgent care to be tested for the flu. She did not say if she would go or not but I highly advised her.

## 2024-09-15 ENCOUNTER — Ambulatory Visit

## 2024-09-16 ENCOUNTER — Other Ambulatory Visit: Payer: Self-pay | Admitting: Family Medicine

## 2024-09-16 ENCOUNTER — Ambulatory Visit (HOSPITAL_COMMUNITY)
Admission: RE | Admit: 2024-09-16 | Discharge: 2024-09-16 | Disposition: A | Discharge status: Home / Self Care | Source: Ambulatory Visit | Attending: Physician Assistant | Admitting: Physician Assistant

## 2024-09-16 VITALS — BP 150/76 | HR 69 | Ht 63.0 in | Wt 150.8 lb

## 2024-09-16 DIAGNOSIS — I4891 Unspecified atrial fibrillation: Secondary | ICD-10-CM | POA: Diagnosis not present

## 2024-09-16 DIAGNOSIS — I483 Typical atrial flutter: Secondary | ICD-10-CM | POA: Diagnosis not present

## 2024-09-16 DIAGNOSIS — D6869 Other thrombophilia: Secondary | ICD-10-CM | POA: Diagnosis not present

## 2024-09-16 DIAGNOSIS — I4819 Other persistent atrial fibrillation: Secondary | ICD-10-CM

## 2024-09-16 NOTE — Progress Notes (Signed)
 "   Primary Care Physician: Duanne Butler DASEN, MD Primary Cardiologist: Maude Emmer, MD Electrophysiologist: Eulas FORBES Furbish, MD  Referring Physician: Dr Furbish Rock Wong Erin is a 74 y.o. female with a history of CHF, HTN, HLD, CKD, CAD, atrial fibrillation who presents for follow up in the [facility].  The patient was initially diagnosed with atrial fibrillation 01/2020 which led to the development of cardiomyopathy with an ejection fraction of 30-35%. Initial treatment with DC cardioversion was unsuccessful, but she later converted to sinus rhythm on amiodarone , resulting in improved left ventricular function. In May 2025, she was admitted with atrial flutter, presenting with palpitations, chest pain, pressure, and shortness of breath. Converted to SR after receiving IV metoprolol . She was seen by Dr Furbish and underwent afib and flutter ablation on 04/13/24. Patient is on Eliquis  for stroke prevention.    Patient returns for follow up for atrial fibrillation. She was seen 07/13/24 by Artist Pouch and reported frequent palpitations. Zio monitor at that time showed 35% afib burden, referred back to AF clinic to discuss rhythm control options. Today, patient reports that her symptoms of palpitations and heart racing have resolved. She was ill with the flu recently and did not have any tachypalpitations during that time. She is in SR today. No bleeding issues on anticoagulation.   Today, she  denies symptoms of palpitations, chest pain, shortness of breath, orthopnea, PND, lower extremity edema, dizziness, presyncope, syncope, snoring, daytime somnolence, bleeding, or neurologic sequela. The patient is tolerating medications without difficulties and is otherwise without complaint today.    Atrial Fibrillation Risk Factors:  she does not have symptoms or diagnosis of sleep apnea. she does not have a history of rheumatic fever.   Atrial Fibrillation Management  history:  Previous antiarrhythmic drugs: amiodarone   Previous cardioversions: 2021 Previous ablations: 04/13/24 Anticoagulation history: Eliquis   ROS- All systems are reviewed and negative except as per the HPI above.  Past Medical History:  Diagnosis Date   Allergy    Arthritis    Asthma    Atrial fibrillation (HCC)    Benign essential HTN    CHF (congestive heart failure) (HCC)    CKD (chronic kidney disease) stage 3, GFR 30-59 ml/min (HCC)    Diverticulosis    Dysrhythmia     Afib -  had Ablation   GERD (gastroesophageal reflux disease)    HLD (hyperlipidemia)    HTN (hypertension)    Pneumonia     Current Outpatient Medications  Medication Sig Dispense Refill   albuterol  (VENTOLIN  HFA) 108 (90 Base) MCG/ACT inhaler Inhale 1 puff into the lungs every 6 (six) hours as needed for wheezing or shortness of breath. 18 g 3   atorvastatin  (LIPITOR) 40 MG tablet Take 1 tablet (40 mg total) by mouth daily. 90 tablet 3   Biotin 5000 MCG TABS Take 5,000 mcg by mouth every evening.     carvedilol  (COREG ) 3.125 MG tablet Take 1 tablet (3.125 mg total) by mouth 2 (two) times daily. 180 tablet 3   Cholecalciferol  25 MCG (1000 UT) tablet Take 1 tablet (1,000 Units total) by mouth daily. 90 tablet 1   Cyanocobalamin (VITAMIN B-12 PO) Take 1 tablet by mouth daily.     fluticasone  (FLONASE ) 50 MCG/ACT nasal spray Place 2 sprays into both nostrils daily as needed for allergies. (Patient taking differently: Place 2 sprays into both nostrils as needed for allergies.) 16 g 3   fluticasone -salmeterol (WIXELA INHUB) 100-50 MCG/ACT AEPB Inhale  1 puff into the lungs 2 (two) times daily. 180 each 2   furosemide  (LASIX ) 40 MG tablet Take 1 tablet (40 mg total) by mouth daily. 90 tablet 3   Misc Natural Products (NEURIVA PO) Take 1 tablet by mouth daily.     polyethylene glycol (MIRALAX  / GLYCOLAX ) 17 g packet Take 17 g by mouth daily.     traMADol  (ULTRAM ) 50 MG tablet TAKE 2 TABLETS (100 MG TOTAL) BY  MOUTH 4 (FOUR) TIMES DAILY. NEEDS TO BE SEEN BEFORE MORE REFILLS 240 tablet 0   apixaban  (ELIQUIS ) 5 MG TABS tablet Take 1 tablet (5 mg total) by mouth 2 (two) times daily. 180 tablet 3   No current facility-administered medications for this encounter.    Physical Exam: BP (!) 150/76   Pulse 69   Ht 5' 3 (1.6 m)   Wt 68.4 kg   BMI 26.71 kg/m   GEN: Well nourished, well developed in no acute distress CARDIAC: Regular rate and rhythm with occasional ectopy, no murmurs, rubs, gallops RESPIRATORY:  Clear to auscultation without rales, wheezing or rhonchi  ABDOMEN: Soft, non-tender, non-distended EXTREMITIES:  No edema; No deformity    Wt Readings from Last 3 Encounters:  09/16/24 68.4 kg  08/18/24 71.8 kg  07/13/24 71.7 kg     EKG Interpretation Date/Time:  Thursday September 16 2024 15:35:16 EST Ventricular Rate:  69 PR Interval:  186 QRS Duration:  90 QT Interval:  390 QTC Calculation: 417 R Axis:   -16  Text Interpretation: Sinus rhythm with frequent Premature atrial complexes When compared with ECG of 13-Jul-2024 11:55, No significant change was found Confirmed by Erin Wong ([id number]) on 09/16/2024 4:12:40 PM    Echo 11/04/23 demonstrated   1. Left ventricular ejection fraction, by estimation, is 60 to 65%. The  left ventricle has normal function. The left ventricle has no regional  wall motion abnormalities. Left ventricular diastolic parameters are  indeterminate.   2. Right ventricular systolic function is normal. The right ventricular  size is normal. Tricuspid regurgitation signal is inadequate for assessing  PA pressure.   3. The mitral valve is normal in structure. Mild mitral valve  regurgitation. No evidence of mitral stenosis.   4. The aortic valve is tricuspid. Aortic valve regurgitation is mild. No  aortic stenosis is present.   5. The inferior vena cava is normal in size with greater than 50%  respiratory variability, suggesting right atrial pressure of 3  mmHg.     CHA2DS2-VASc Score = 4  The patient's score is based upon: CHF History: 1 HTN History: 1 Diabetes History: 0 Stroke History: 0 Vascular Disease History: 0 Age Score: 1 Gender Score: 1       ASSESSMENT AND PLAN: Persistent Atrial Fibrillation/atrial flutter (ICD10:  I48.19) The patient's CHA2DS2-VASc score is 4, indicating a 4.8% annual risk of stroke.   S/p afib and flutter ablation 04/13/24. Amiodarone  discontinued post ablation.  Recent monitor showed 35% afib burden. We discussed rhythm control options today including dofetilide, amiodarone , and repeat ablation. Given that her symptoms have subsided, she would like to pursue watchful waiting for now. If her symptoms return, she would favor repeat ablation over AAD.  Continue Eliquis  5 mg BID Continue carvedilol  3.125 mg BID  Secondary Hypercoagulable State (ICD10:  D68.69) The patient is at significant risk for stroke/thromboembolism based upon her CHA2DS2-VASc Score of 4.  Continue Apixaban  (Eliquis ). No bleeding issues.   HFrecEF EF 60-65% GDMT per primary cardiology team Fluid status appears stable  today  CAD CAC score 905 No anginal symptoms Followed by Dr Delford   Follow up in the AF clinic in 3 months.    [facility] [address] [place], [place] [address] [phone] "

## 2024-09-21 ENCOUNTER — Other Ambulatory Visit: Payer: Self-pay | Admitting: Family Medicine

## 2024-09-21 NOTE — Telephone Encounter (Signed)
 Copied from CRM #[id number]. Topic: Clinical - Prescription Issue >> Sep 21, 2024 10:57 AM Delon DASEN wrote: Reason for CRM: traMADol  (ULTRAM ) 50 MG tablet- pharmacy does not have the prescription refill request

## 2024-09-23 ENCOUNTER — Ambulatory Visit

## 2024-09-23 VITALS — Ht 63.0 in | Wt 150.0 lb

## 2024-09-23 DIAGNOSIS — Z0000 Encounter for general adult medical examination without abnormal findings: Secondary | ICD-10-CM | POA: Diagnosis not present

## 2024-09-23 NOTE — Progress Notes (Signed)
 "  Chief Complaint  Patient presents with   Medicare Wellness     Subjective:   Erin Wong is a 74 y.o. female who presents for a Medicare Annual Wellness Visit.  Visit info / Clinical Intake: Medicare Wellness Visit Type:: Subsequent Annual Wellness Visit Persons participating in visit and providing information:: patient Interpreter Needed?: No Pre-visit prep was completed: yes AWV questionnaire completed by patient prior to visit?: no Living arrangements:: lives with spouse/significant other Patient's Overall Health Status Rating: good Typical amount of pain: some Does pain affect daily life?: no Are you currently prescribed opioids?: no  Dietary Habits and Nutritional Risks How many meals a day?: 3 Eats fruit and vegetables daily?: yes Most meals are obtained by: preparing own meals In the last 2 weeks, have you had any of the following?: none Diabetic:: no  Functional Status Activities of Daily Living (to include ambulation/medication): Independent Ambulation: Independent Medication Administration: Independent Home Management (perform basic housework or laundry): Independent Manage your own finances?: yes Primary transportation is: driving Concerns about vision?: no *vision screening is required for WTM* Concerns about hearing?: no  Fall Screening Falls in the past year?: 0 Number of falls in past year: 0 Was there an injury with Fall?: 0 Fall Risk Category Calculator: 0 Patient Fall Risk Level: Low Fall Risk  Fall Risk Patient at Risk for Falls Due to: Impaired mobility Fall risk Follow up: Education provided; Falls prevention discussed; Falls evaluation completed  Home and Transportation Safety: All rugs have non-skid backing?: yes All stairs or steps have railings?: yes Grab bars in the bathtub or shower?: yes Have non-skid surface in bathtub or shower?: yes Good home lighting?: yes Regular seat belt use?: yes Hospital stays in the last year:: (!)  yes How many hospital stays:: 2 Reason: Cardiac  Cognitive Assessment Difficulty concentrating, remembering, or making decisions? : no Will 6CIT or Mini Cog be Completed: yes What year is it?: 0 points What month is it?: 0 points Give patient an address phrase to remember (5 components): [address] [place] what time is it?: 0 points Count backwards from 20 to 1: 0 points Say the months of the year in reverse: 2 points Repeat the address phrase from earlier: 2 points 6 CIT Score: 4 points  Advance Directives (For Healthcare) Does Patient Have a Medical Advance Directive?: No Would patient like information on creating a medical advance directive?: Yes (MAU/Ambulatory/Procedural Areas - Information given)  Reviewed/Updated  Reviewed/Updated: Reviewed All (Medical, Surgical, Family, Medications, Allergies, Care Teams, Patient Goals)    Allergies (verified) Patient has no known allergies.   Current Medications (verified) Outpatient Encounter Medications as of 09/23/2024  Medication Sig   albuterol  (VENTOLIN  HFA) 108 (90 Base) MCG/ACT inhaler Inhale 1 puff into the lungs every 6 (six) hours as needed for wheezing or shortness of breath.   apixaban  (ELIQUIS ) 5 MG TABS tablet Take 1 tablet (5 mg total) by mouth 2 (two) times daily.   atorvastatin  (LIPITOR) 40 MG tablet Take 1 tablet (40 mg total) by mouth daily.   Biotin 5000 MCG TABS Take 5,000 mcg by mouth every evening.   carvedilol  (COREG ) 3.125 MG tablet Take 1 tablet (3.125 mg total) by mouth 2 (two) times daily.   Cholecalciferol  25 MCG (1000 UT) tablet Take 1 tablet (1,000 Units total) by mouth daily.   Cyanocobalamin (VITAMIN B-12 PO) Take 1 tablet by mouth daily.   fluticasone  (FLONASE ) 50 MCG/ACT nasal spray Place 2 sprays into both nostrils daily  as needed for allergies. (Patient taking differently: Place 2 sprays into both nostrils as needed for allergies.)   fluticasone -salmeterol (WIXELA INHUB) 100-50  MCG/ACT AEPB Inhale 1 puff into the lungs 2 (two) times daily.   furosemide  (LASIX ) 40 MG tablet Take 1 tablet (40 mg total) by mouth daily.   Misc Natural Products (NEURIVA PO) Take 1 tablet by mouth daily.   polyethylene glycol (MIRALAX  / GLYCOLAX ) 17 g packet Take 17 g by mouth daily.   traMADol  (ULTRAM ) 50 MG tablet TAKE 2 TABLETS (100 MG TOTAL) BY MOUTH 4 (FOUR) TIMES DAILY. NEEDS TO BE SEEN BEFORE MORE REFILLS   No facility-administered encounter medications on file as of 09/23/2024.    History: Past Medical History:  Diagnosis Date   Allergy    Arthritis    Asthma    Atrial fibrillation (HCC)    Benign essential HTN    CHF (congestive heart failure) (HCC)    CKD (chronic kidney disease) stage 3, GFR 30-59 ml/min (HCC)    Diverticulosis    Dysrhythmia     Afib -  had Ablation   GERD (gastroesophageal reflux disease)    HLD (hyperlipidemia)    HTN (hypertension)    Pneumonia    Past Surgical History:  Procedure Laterality Date   A-FLUTTER ABLATION N/A 04/13/2024   Procedure: A-FLUTTER ABLATION;  Surgeon: Mealor, Eulas BRAVO, MD;  Location: [facility];  Service: Cardiovascular;  Laterality: N/A;   ABDOMINAL HYSTERECTOMY  10/04/1995   Hysterectomy and Bilateral oophorectomy   ATRIAL FIBRILLATION ABLATION N/A 04/13/2024   Procedure: ATRIAL FIBRILLATION ABLATION;  Surgeon: Nancey Eulas BRAVO, MD;  Location: [facility];  Service: Cardiovascular;  Laterality: N/A;   CARDIOVERSION N/A 01/17/2020   Procedure: CARDIOVERSION;  Surgeon: Delford Maude BROCKS, MD;  Location: [facility] ENDOSCOPY;  Service: Cardiovascular;  Laterality: N/A;   COLON SURGERY  09/2009   hole in colon   FRACTURE SURGERY Right 08/02/2011   Right Leg--Plates & Screws   HAND SURGERY Right 09/02/2006   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--Arthritis   HARDWARE REMOVAL Right 12/04/2020   Procedure: rigth knee hardware removal;  Surgeon: Jerri Kay HERO, MD;  Location: [facility] OR;  Service: Orthopedics;  Laterality: Right;    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   at time of colon surgery   TEE WITHOUT CARDIOVERSION N/A 01/17/2020   Procedure: TRANSESOPHAGEAL ECHOCARDIOGRAM (TEE);  Surgeon: Delford Maude BROCKS, MD;  Location: [facility] ENDOSCOPY;  Service: Cardiovascular;  Laterality: N/A;   TOTAL KNEE ARTHROPLASTY Right 02/19/2021   Procedure: RIGHT TOTAL KNEE ARTHROPLASTY;  Surgeon: Jerri Kay HERO, MD;  Location: [facility] OR;  Service: Orthopedics;  Laterality: Right;   TRANSESOPHAGEAL ECHOCARDIOGRAM (CATH LAB) N/A 04/13/2024   Procedure: TRANSESOPHAGEAL ECHOCARDIOGRAM;  Surgeon: Nancey Eulas BRAVO, MD;  Location: [facility];  Service: Cardiovascular;  Laterality: N/A;   Family History  Problem Relation Age of Onset   Arthritis Mother    Miscarriages / Stillbirths Mother    Lung cancer Mother        Had quit smoking for 25 years   Esophageal cancer Mother    Alcohol abuse Father    Breast cancer Sister    Alcohol abuse Brother    Kidney cancer Brother    COPD Brother    Asthma Maternal Aunt    Diabetes Maternal Aunt    Colon cancer Neg Hx    Inflammatory bowel disease Neg Hx    Liver disease Neg Hx    Pancreatic cancer Neg Hx  Rectal cancer Neg Hx    Stomach cancer Neg Hx    Colon polyps Neg Hx    Social History   Occupational History   Occupation: retired  Tobacco Use   Smoking status: Former    Current packs/day: 0.00    Types: Cigarettes    Quit date: 01/10/1991    Years since quitting: 33.7   Smokeless tobacco: Never   Tobacco comments:    Former smoker 05/10/24  Vaping Use   Vaping status: Never Used  Substance and Sexual Activity   Alcohol use: Not Currently    Comment: quit in May/2021   Drug use: No   Sexual activity: Never   Tobacco Counseling Counseling given: Not Answered Tobacco comments: Former smoker 05/10/24  SDOH Screenings   Food Insecurity: No Food Insecurity (09/23/2024)  Housing: Low Risk (09/23/2024)  Transportation Needs: No Transportation Needs (09/23/2024)  Utilities: Not At  Risk (09/23/2024)  Alcohol Screen: Low Risk (01/16/2023)  Depression (PHQ2-9): Low Risk (09/23/2024)  Financial Resource Strain: Low Risk (01/16/2023)  Physical Activity: Insufficiently Active (09/23/2024)  Social Connections: Moderately Integrated (09/23/2024)  Stress: No Stress Concern Present (09/23/2024)  Tobacco Use: Medium Risk (09/23/2024)  Health Literacy: Adequate Health Literacy (09/23/2024)   See flowsheets for full screening details  Depression Screen Depression Screening Exception Documentation Depression Screening Exception:: Patient refusal  PHQ 2 & 9 Depression Scale- Over the past 2 weeks, how often have you been bothered by any of the following problems? Little interest or pleasure in doing things: 0 Feeling down, depressed, or hopeless (PHQ Adolescent also includes...irritable): 0 PHQ-2 Total Score: 0     Goals Addressed             This Visit's Progress    Remain active and independent   On track            Objective:    Today's Vitals   09/23/24 1118  Weight: 150 lb (68 kg)  Height: 5' 3 (1.6 m)   Body mass index is 26.57 kg/m.  Hearing/Vision screen No results found. Immunizations and Health Maintenance Health Maintenance  Topic Date Due   Zoster Vaccines- Shingrix (1 of 2) Never done   COVID-19 Vaccine (3 - Pfizer risk series) 01/17/2020   Mammogram  01/21/2025   DTaP/Tdap/Td (2 - Td or Tdap) 04/22/2025   Medicare Annual Wellness (AWV)  09/23/2025   Colonoscopy  01/05/2029   Pneumococcal Vaccine: 50+ Years  Completed   Influenza Vaccine  Completed   Bone Density Scan  Completed   Hepatitis C Screening  Completed   Meningococcal B Vaccine  Aged Out   Hepatitis B Vaccines 19-59 Average Risk  Discontinued        Assessment/Plan:  This is a routine wellness examination for Erin Wong.  Patient Care Team: Duanne Butler DASEN, MD as PCP - General (Family Medicine) Delford Maude BROCKS, MD as PCP - Cardiology (Cardiology) Mealor, Eulas BRAVO, MD as  PCP - Electrophysiology (Cardiology[facility] [employer] Jerri Kay HERO, MD as Attending Physician (Orthopedic Surgery) Shelah Lamar RAMAN, MD as Consulting Physician (Pulmonary Disease) Christine Rush, DPM as Consulting Physician (Podiatry)  I have personally reviewed and noted the following in the patients chart:   Medical and social history Use of alcohol, tobacco or illicit drugs  Current medications and supplements including opioid prescriptions. Functional ability and status Nutritional status Physical activity Advanced directives List of other physicians Hospitalizations, surgeries, and ER visits in previous 12 months Vitals Screenings to include cognitive, depression, and falls  Referrals and appointments  No orders of the defined types were placed in this encounter.  In addition, I have reviewed and discussed with patient certain preventive protocols, quality metrics, and best practice recommendations. A written personalized care plan for preventive services as well as general preventive health recommendations were provided to patient.   Erin Wong, [place]   [id number]   Return in 1 year (on 09/23/2025).  After Visit Summary: (Mail) Due to this being a telephonic visit, the after visit summary with patients personalized plan was offered to patient via mail   Nurse Notes: No voiced or noted concerns at this time Appointment(s) made: (for 12/09/24)  "

## 2024-09-23 NOTE — Patient Instructions (Signed)
 Erin Wong,  Thank you for taking the time for your Medicare Wellness Visit. I appreciate your continued commitment to your health goals. Please review the care plan we discussed, and feel free to reach out if I can assist you further.  Please note that Annual Wellness Visits do not include a physical exam. Some assessments may be limited, especially if the visit was conducted virtually. If needed, we may recommend an in-person follow-up with your provider.  Ongoing Care Seeing your primary care provider every 3 to 6 months helps us  monitor your health and provide consistent, personalized care.   Referrals If a referral was made during today's visit and you haven't received any updates within two weeks, please contact the referred provider directly to check on the status.  Recommended Screenings:  Health Maintenance  Topic Date Due   Zoster (Shingles) Vaccine (1 of 2) Never done   COVID-19 Vaccine (3 - Pfizer risk series) 01/17/2020   Breast Cancer Screening  01/21/2025   DTaP/Tdap/Td vaccine (2 - Td or Tdap) 04/22/2025   Medicare Annual Wellness Visit  09/23/2025   Colon Cancer Screening  01/05/2029   Pneumococcal Vaccine for age over 78  Completed   Flu Shot  Completed   Osteoporosis screening with Bone Density Scan  Completed   Hepatitis C Screening  Completed   Meningitis B Vaccine  Aged Out   Hepatitis B Vaccine  Discontinued       09/23/2024   11:19 AM  Advanced Directives  Does Patient Have a Medical Advance Directive? No  Would patient like information on creating a medical advance directive? Yes (MAU/Ambulatory/Procedural Areas - Information given)   Information on Advanced Care Planning can be found at [place]  [employer] [facility] Advance Health Care Directives Advance Health Care Directives ([email or web address]   Vision: Annual vision screenings are recommended for early detection of glaucoma, cataracts, and diabetic retinopathy. These exams can also reveal signs of  chronic conditions such as diabetes and high blood pressure.  Dental: Annual dental screenings help detect early signs of oral cancer, gum disease, and other conditions linked to overall health, including heart disease and diabetes.  Please see the attached documents for additional preventive care recommendations.

## 2024-09-28 NOTE — Progress Notes (Signed)
 "  Chief Complaint  Patient presents with   Medicare Wellness     Subjective:   Erin Wong is a 74 y.o. female who presents for a Medicare Annual Wellness Visit.  Visit info / Clinical Intake: Medicare Wellness Visit Type:: Subsequent Annual Wellness Visit Persons participating in visit and providing information:: patient Medicare Wellness Visit Mode:: Telephone If telephone:: video declined Since this visit was completed virtually, some vitals may be partially provided or unavailable. Missing vitals are due to the limitations of the virtual format.: Documented vitals are patient reported If Telephone or Video please confirm:: I connected with patient using audio/video enable telemedicine. I verified patient identity with two identifiers, discussed telehealth limitations, and patient agreed to proceed. Patient Location:: home Provider Location:: office Interpreter Needed?: No Pre-visit prep was completed: yes AWV questionnaire completed by patient prior to visit?: no Living arrangements:: lives with spouse/significant other Patient's Overall Health Status Rating: good Typical amount of pain: some Does pain affect daily life?: no Are you currently prescribed opioids?: no  Dietary Habits and Nutritional Risks How many meals a day?: 3 Eats fruit and vegetables daily?: yes Most meals are obtained by: preparing own meals In the last 2 weeks, have you had any of the following?: none Diabetic:: no  Functional Status Activities of Daily Living (to include ambulation/medication): Independent Ambulation: Independent Medication Administration: Independent Home Management (perform basic housework or laundry): Independent Manage your own finances?: yes Primary transportation is: driving Concerns about vision?: no *vision screening is required for WTM* Concerns about hearing?: no  Fall Screening Falls in the past year?: 0 Number of falls in past year: 0 Was there an injury with  Fall?: 0 Fall Risk Category Calculator: 0 Patient Fall Risk Level: Low Fall Risk  Fall Risk Patient at Risk for Falls Due to: Impaired mobility Fall risk Follow up: Education provided; Falls prevention discussed; Falls evaluation completed  Home and Transportation Safety: All rugs have non-skid backing?: yes All stairs or steps have railings?: yes Grab bars in the bathtub or shower?: yes Have non-skid surface in bathtub or shower?: yes Good home lighting?: yes Regular seat belt use?: yes Hospital stays in the last year:: (!) yes How many hospital stays:: 2 Reason: Cardiac  Cognitive Assessment Difficulty concentrating, remembering, or making decisions? : no Will 6CIT or Mini Cog be Completed: yes What year is it?: 0 points What month is it?: 0 points Give patient an address phrase to remember (5 components): [address] [place] About what time is it?: 0 points Count backwards from 20 to 1: 0 points Say the months of the year in reverse: 2 points Repeat the address phrase from earlier: 2 points 6 CIT Score: 4 points  Advance Directives (For Healthcare) Does Patient Have a Medical Advance Directive?: No Would patient like information on creating a medical advance directive?: Yes (MAU/Ambulatory/Procedural Areas - Information given)  Reviewed/Updated  Reviewed/Updated: Reviewed All (Medical, Surgical, Family, Medications, Allergies, Care Teams, Patient Goals)    Allergies (verified) Patient has no known allergies.   Current Medications (verified) Outpatient Encounter Medications as of 09/23/2024  Medication Sig   albuterol  (VENTOLIN  HFA) 108 (90 Base) MCG/ACT inhaler Inhale 1 puff into the lungs every 6 (six) hours as needed for wheezing or shortness of breath.   apixaban  (ELIQUIS ) 5 MG TABS tablet Take 1 tablet (5 mg total) by mouth 2 (two) times daily.   atorvastatin  (LIPITOR) 40 MG tablet Take 1 tablet (40 mg total) by mouth daily.  Biotin 5000 MCG TABS Take  5,000 mcg by mouth every evening.   carvedilol  (COREG ) 3.125 MG tablet Take 1 tablet (3.125 mg total) by mouth 2 (two) times daily.   Cholecalciferol  25 MCG (1000 UT) tablet Take 1 tablet (1,000 Units total) by mouth daily.   Cyanocobalamin (VITAMIN B-12 PO) Take 1 tablet by mouth daily.   fluticasone  (FLONASE ) 50 MCG/ACT nasal spray Place 2 sprays into both nostrils daily as needed for allergies. (Patient taking differently: Place 2 sprays into both nostrils as needed for allergies.)   fluticasone -salmeterol (WIXELA INHUB) 100-50 MCG/ACT AEPB Inhale 1 puff into the lungs 2 (two) times daily.   furosemide  (LASIX ) 40 MG tablet Take 1 tablet (40 mg total) by mouth daily.   Misc Natural Products (NEURIVA PO) Take 1 tablet by mouth daily.   polyethylene glycol (MIRALAX  / GLYCOLAX ) 17 g packet Take 17 g by mouth daily.   traMADol  (ULTRAM ) 50 MG tablet TAKE 2 TABLETS (100 MG TOTAL) BY MOUTH 4 (FOUR) TIMES DAILY. NEEDS TO BE SEEN BEFORE MORE REFILLS   No facility-administered encounter medications on file as of 09/23/2024.    History: Past Medical History:  Diagnosis Date   Allergy    Arthritis    Asthma    Atrial fibrillation (HCC)    Benign essential HTN    CHF (congestive heart failure) (HCC)    CKD (chronic kidney disease) stage 3, GFR 30-59 ml/min (HCC)    Diverticulosis    Dysrhythmia     Afib -  had Ablation   GERD (gastroesophageal reflux disease)    HLD (hyperlipidemia)    HTN (hypertension)    Pneumonia    Past Surgical History:  Procedure Laterality Date   A-FLUTTER ABLATION N/A 04/13/2024   Procedure: A-FLUTTER ABLATION;  Surgeon: Mealor, Eulas BRAVO, MD;  Location: [facility];  Service: Cardiovascular;  Laterality: N/A;   ABDOMINAL HYSTERECTOMY  10/04/1995   Hysterectomy and Bilateral oophorectomy   ATRIAL FIBRILLATION ABLATION N/A 04/13/2024   Procedure: ATRIAL FIBRILLATION ABLATION;  Surgeon: Nancey Eulas BRAVO, MD;  Location: [facility];  Service:  Cardiovascular;  Laterality: N/A;   CARDIOVERSION N/A 01/17/2020   Procedure: CARDIOVERSION;  Surgeon: Delford Maude BROCKS, MD;  Location: [facility] ENDOSCOPY;  Service: Cardiovascular;  Laterality: N/A;   COLON SURGERY  09/2009   hole in colon   FRACTURE SURGERY Right 08/02/2011   Right Leg--Plates & Screws   HAND SURGERY Right 09/02/2006   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 OR;  Service: Orthopedics;  Laterality: Right;   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   at time of colon surgery   TEE WITHOUT CARDIOVERSION N/A 01/17/2020   Procedure: TRANSESOPHAGEAL ECHOCARDIOGRAM (TEE);  Surgeon: Delford Maude BROCKS, MD;  Location: [facility] ENDOSCOPY;  Service: Cardiovascular;  Laterality: N/A;   TOTAL KNEE ARTHROPLASTY Right 02/19/2021   Procedure: RIGHT TOTAL KNEE ARTHROPLASTY;  Surgeon: Jerri Kay HERO, MD;  Location: [facility] OR;  Service: Orthopedics;  Laterality: Right;   TRANSESOPHAGEAL ECHOCARDIOGRAM (CATH LAB) N/A 04/13/2024   Procedure: TRANSESOPHAGEAL ECHOCARDIOGRAM;  Surgeon: Nancey Eulas BRAVO, MD;  Location: [facility];  Service: Cardiovascular;  Laterality: N/A;   Family History  Problem Relation Age of Onset   Arthritis Mother    Miscarriages / Stillbirths Mother    Lung cancer Mother        Had quit smoking for 25 years  Esophageal cancer Mother    Alcohol abuse Father    Breast cancer Sister    Alcohol abuse Brother    Kidney cancer Brother    COPD Brother    Asthma Maternal Aunt    Diabetes Maternal Aunt    Colon cancer Neg Hx    Inflammatory bowel disease Neg Hx    Liver disease Neg Hx    Pancreatic cancer Neg Hx    Rectal cancer Neg Hx    Stomach cancer Neg Hx    Colon polyps Neg Hx    Social History   Occupational History   Occupation: retired  Tobacco Use   Smoking status: Former    Current packs/day: 0.00    Types: Cigarettes    Quit date: 01/10/1991    Years  since quitting: 33.7   Smokeless tobacco: Never   Tobacco comments:    Former smoker 05/10/24  Vaping Use   Vaping status: Never Used  Substance and Sexual Activity   Alcohol use: Not Currently    Comment: quit in May/2021   Drug use: No   Sexual activity: Never   Tobacco Counseling Counseling given: Not Answered Tobacco comments: Former smoker 05/10/24  SDOH Screenings   Food Insecurity: No Food Insecurity (09/23/2024)  Housing: Low Risk (09/23/2024)  Transportation Needs: No Transportation Needs (09/23/2024)  Utilities: Not At Risk (09/23/2024)  Alcohol Screen: Low Risk (01/16/2023)  Depression (PHQ2-9): Low Risk (09/23/2024)  Financial Resource Strain: Low Risk (01/16/2023)  Physical Activity: Insufficiently Active (09/23/2024)  Social Connections: Moderately Integrated (09/23/2024)  Stress: No Stress Concern Present (09/23/2024)  Tobacco Use: Medium Risk (09/23/2024)  Health Literacy: Adequate Health Literacy (09/23/2024)   See flowsheets for full screening details  Depression Screen Depression Screening Exception Documentation Depression Screening Exception:: Patient refusal  PHQ 2 & 9 Depression Scale- Over the past 2 weeks, how often have you been bothered by any of the following problems? Little interest or pleasure in doing things: 0 Feeling down, depressed, or hopeless (PHQ Adolescent also includes...irritable): 0 PHQ-2 Total Score: 0     Goals Addressed             This Visit's Progress    Remain active and independent   On track            Objective:    Today's Vitals   09/23/24 1118  Weight: 150 lb (68 kg)  Height: 5' 3 (1.6 m)   Body mass index is 26.57 kg/m.  Hearing/Vision screen No results found. Immunizations and Health Maintenance Health Maintenance  Topic Date Due   Zoster Vaccines- Shingrix (1 of 2) Never done   COVID-19 Vaccine (3 - Pfizer risk series) 01/17/2020   Mammogram  01/21/2025   DTaP/Tdap/Td (2 - Td or Tdap) 04/22/2025    Medicare Annual Wellness (AWV)  09/23/2025   Colonoscopy  01/05/2029   Pneumococcal Vaccine: 50+ Years  Completed   Influenza Vaccine  Completed   Bone Density Scan  Completed   Hepatitis C Screening  Completed   Meningococcal B Vaccine  Aged Out   Hepatitis B Vaccines 19-59 Average Risk  Discontinued        Assessment/Plan:  This is a routine wellness examination for [place].  Patient Care Team: Duanne Butler DASEN, MD as PCP - General (Family Medicine) Delford Maude BROCKS, MD as PCP - Cardiology (Cardiology) Mealor, Eulas BRAVO, MD as PCP - Electrophysiology (Cardiology) [employer] Jerri Kay HERO, MD as Attending Physician (Orthopedic Surgery) Shelah Lamar RAMAN, MD as  Consulting Physician (Pulmonary Disease) Christine Rush, DPM as Consulting Physician (Podiatry)  I have personally reviewed and noted the following in the patients chart:   Medical and social history Use of alcohol, tobacco or illicit drugs  Current medications and supplements including opioid prescriptions. Functional ability and status Nutritional status Physical activity Advanced directives List of other physicians Hospitalizations, surgeries, and ER visits in previous 12 months Vitals Screenings to include cognitive, depression, and falls Referrals and appointments  No orders of the defined types were placed in this encounter.  In addition, I have reviewed and discussed with patient certain preventive protocols, quality metrics, and best practice recommendations. A written personalized care plan for preventive services as well as general preventive health recommendations were provided to patient.   Lavelle Charmaine Browner, LPN   8/72/7973   Return in 1 year (on 09/23/2025).  After Visit Summary: (Mail) Due to this being a telephonic visit, the after visit summary with patients personalized plan was offered to patient via mail   Nurse Notes: No voiced or noted concerns at this time  "

## 2024-10-13 ENCOUNTER — Ambulatory Visit: Admitting: Student

## 2024-12-09 ENCOUNTER — Ambulatory Visit: Admitting: Family Medicine

## 2024-12-15 ENCOUNTER — Ambulatory Visit (HOSPITAL_COMMUNITY): Admitting: Physician Assistant

## 2025-02-25 ENCOUNTER — Other Ambulatory Visit
